# Patient Record
Sex: Male | Born: 1945 | Race: White | Hispanic: No | Marital: Married | Smoking: Former smoker
Health system: Southern US, Community
[De-identification: ages and names within clinical notes are randomized; demographics above are authoritative.]

## PROBLEM LIST (undated history)

## (undated) DIAGNOSIS — Z955 Presence of coronary angioplasty implant and graft: Secondary | ICD-10-CM

## (undated) DIAGNOSIS — N189 Chronic kidney disease, unspecified: Secondary | ICD-10-CM

## (undated) DIAGNOSIS — F17210 Nicotine dependence, cigarettes, uncomplicated: Secondary | ICD-10-CM

## (undated) DIAGNOSIS — N400 Enlarged prostate without lower urinary tract symptoms: Secondary | ICD-10-CM

## (undated) DIAGNOSIS — J45909 Unspecified asthma, uncomplicated: Secondary | ICD-10-CM

## (undated) DIAGNOSIS — Z951 Presence of aortocoronary bypass graft: Secondary | ICD-10-CM

## (undated) DIAGNOSIS — E7800 Pure hypercholesterolemia, unspecified: Secondary | ICD-10-CM

## (undated) DIAGNOSIS — I4891 Unspecified atrial fibrillation: Secondary | ICD-10-CM

## (undated) DIAGNOSIS — I10 Essential (primary) hypertension: Secondary | ICD-10-CM

## (undated) DIAGNOSIS — J449 Chronic obstructive pulmonary disease, unspecified: Secondary | ICD-10-CM

## (undated) DIAGNOSIS — R059 Cough, unspecified: Secondary | ICD-10-CM

## (undated) DIAGNOSIS — R600 Localized edema: Secondary | ICD-10-CM

## (undated) DIAGNOSIS — I2510 Atherosclerotic heart disease of native coronary artery without angina pectoris: Secondary | ICD-10-CM

## (undated) HISTORY — DX: Presence of aortocoronary bypass graft: Z95.1

## (undated) HISTORY — DX: Pure hypercholesterolemia, unspecified: E78.00

## (undated) HISTORY — DX: Localized edema: R60.0

## (undated) HISTORY — DX: Unspecified asthma, uncomplicated: J45.909

## (undated) HISTORY — DX: Presence of coronary angioplasty implant and graft: Z95.5

## (undated) HISTORY — DX: Chronic kidney disease, unspecified: N18.9

## (undated) HISTORY — DX: Unspecified atrial fibrillation: I48.91

## (undated) HISTORY — PX: CORONARY ARTERY BYPASS GRAFT: SHX141

## (undated) HISTORY — DX: Benign prostatic hyperplasia without lower urinary tract symptoms: N40.0

## (undated) HISTORY — DX: Cough, unspecified: R05.9

## (undated) HISTORY — DX: Nicotine dependence, cigarettes, uncomplicated: F17.210

## (undated) HISTORY — PX: CARDIAC SURGERY: SHX584

## (undated) HISTORY — DX: Chronic obstructive pulmonary disease, unspecified: J44.9

## (undated) HISTORY — DX: Essential (primary) hypertension: I10

---

## 2014-06-01 DIAGNOSIS — I959 Hypotension, unspecified: Secondary | ICD-10-CM | POA: Insufficient documentation

## 2014-06-01 DIAGNOSIS — J9602 Acute respiratory failure with hypercapnia: Secondary | ICD-10-CM | POA: Insufficient documentation

## 2014-06-01 DIAGNOSIS — E119 Type 2 diabetes mellitus without complications: Secondary | ICD-10-CM | POA: Insufficient documentation

## 2014-06-01 DIAGNOSIS — I10 Essential (primary) hypertension: Secondary | ICD-10-CM | POA: Insufficient documentation

## 2014-06-03 DIAGNOSIS — I2510 Atherosclerotic heart disease of native coronary artery without angina pectoris: Secondary | ICD-10-CM | POA: Insufficient documentation

## 2014-06-03 DIAGNOSIS — N179 Acute kidney failure, unspecified: Secondary | ICD-10-CM | POA: Insufficient documentation

## 2014-06-03 DIAGNOSIS — N189 Chronic kidney disease, unspecified: Secondary | ICD-10-CM | POA: Insufficient documentation

## 2014-06-03 DIAGNOSIS — D638 Anemia in other chronic diseases classified elsewhere: Secondary | ICD-10-CM | POA: Insufficient documentation

## 2014-06-03 DIAGNOSIS — D649 Anemia, unspecified: Secondary | ICD-10-CM | POA: Insufficient documentation

## 2020-03-28 ENCOUNTER — Emergency Department (HOSPITAL_COMMUNITY)
Admission: EM | Admit: 2020-03-28 | Discharge: 2020-03-28 | Disposition: A | Discharge status: Home / Self Care | Payer: Medicare Other | Attending: Emergency Medicine | Admitting: Emergency Medicine

## 2020-03-28 ENCOUNTER — Encounter (HOSPITAL_COMMUNITY): Payer: Self-pay | Admitting: *Deleted

## 2020-03-28 ENCOUNTER — Other Ambulatory Visit: Payer: Self-pay

## 2020-03-28 DIAGNOSIS — T83091A Other mechanical complication of indwelling urethral catheter, initial encounter: Secondary | ICD-10-CM | POA: Diagnosis not present

## 2020-03-28 DIAGNOSIS — T83098A Other mechanical complication of other indwelling urethral catheter, initial encounter: Secondary | ICD-10-CM | POA: Diagnosis not present

## 2020-03-28 DIAGNOSIS — T839XXA Unspecified complication of genitourinary prosthetic device, implant and graft, initial encounter: Secondary | ICD-10-CM

## 2020-03-28 DIAGNOSIS — R339 Retention of urine, unspecified: Secondary | ICD-10-CM | POA: Diagnosis present

## 2020-03-28 DIAGNOSIS — Y731 Therapeutic (nonsurgical) and rehabilitative gastroenterology and urology devices associated with adverse incidents: Secondary | ICD-10-CM | POA: Diagnosis not present

## 2020-03-28 DIAGNOSIS — N401 Enlarged prostate with lower urinary tract symptoms: Secondary | ICD-10-CM | POA: Insufficient documentation

## 2020-03-28 DIAGNOSIS — I2510 Atherosclerotic heart disease of native coronary artery without angina pectoris: Secondary | ICD-10-CM | POA: Diagnosis not present

## 2020-03-28 HISTORY — DX: Atherosclerotic heart disease of native coronary artery without angina pectoris: I25.10

## 2020-03-28 LAB — URINALYSIS, ROUTINE W REFLEX MICROSCOPIC
Bilirubin Urine: NEGATIVE
Glucose, UA: NEGATIVE mg/dL
Ketones, ur: NEGATIVE mg/dL
Nitrite: NEGATIVE
Protein, ur: NEGATIVE mg/dL
RBC / HPF: >50 RBC/hpf — ABNORMAL HIGH (ref 0–5)
Specific Gravity, Urine: 1.009 (ref 1.005–1.030)
WBC, UA: >50 WBC/hpf — ABNORMAL HIGH (ref 0–5)
pH: 7 (ref 5.0–8.0)

## 2020-03-28 NOTE — ED Triage Notes (Signed)
Has indwelling catheter but states it is not draining

## 2020-03-28 NOTE — ED Provider Notes (Signed)
[facility] EMERGENCY DEPARTMENT Provider Note   CSN: [id number] Arrival date & time: 03/28/20  1405     History Chief Complaint  Patient presents with  . Urinary Retention    Romelle Reiley is a 74 y.o. male.  HPI  Patient is a 74 year old male story significant for coronary artery disease and BPH.  He was recently diagnosed with BPH after he had issues with urinary retention.  He has seen neurology in another state and has recently moved to regional [facility] where he is hoping to establish care for primary care doctor within the next week.  He has not established with a urologist here.  Patient states that this morning he was unable to urinate/void from his Foley catheter.  He states that without his Foley catheter he retains urine/sometimes has urinary incontinence.  Patient denies any fever, abdominal pain, nausea, vomiting, chest pain or shortness of breath.  States he feels otherwise well.     Past Medical History:  Diagnosis Date  . Coronary artery disease     There are no problems to display for this patient.        No family history on file.  Social History   Tobacco Use  . Smoking status: Not on file  Substance Use Topics  . Alcohol use: Not on file  . Drug use: Not on file    Home Medications Prior to Admission medications   Not on File    Allergies    Lipitor [atorvastatin]  Review of Systems   Review of Systems  Constitutional: Negative for chills and fever.  HENT: Negative for congestion.   Respiratory: Negative for shortness of breath.   Cardiovascular: Negative for chest pain.  Gastrointestinal: Negative for abdominal pain.  Genitourinary: Positive for difficulty urinating.  Musculoskeletal: Negative for neck pain.    Physical Exam Updated Vital Signs BP 138/85   Pulse 78   Temp 97.6 F (36.4 C)   Resp 20   SpO2 98%   Physical Exam Vitals and nursing note reviewed.  Constitutional:      General: He is not in  acute distress.    Appearance: Normal appearance. He is not ill-appearing.  HENT:     Head: Normocephalic and atraumatic.     Mouth/Throat:     Mouth: Mucous membranes are moist.  Eyes:     General: No scleral icterus.       Right eye: No discharge.        Left eye: No discharge.     Conjunctiva/sclera: Conjunctivae normal.  Pulmonary:     Effort: Pulmonary effort is normal.     Breath sounds: No stridor.  Abdominal:     Tenderness: There is no abdominal tenderness. There is no right CVA tenderness, left CVA tenderness, guarding or rebound.  Genitourinary:    Penis: Normal.      Testes: Normal.     Comments: Patient with Foley catheter in place.  It is not currently draining into leg bag Neurological:     Mental Status: He is alert and oriented to person, place, and time. Mental status is at baseline.     ED Results / Procedures / Treatments   Labs (all labs ordered are listed, but only abnormal results are displayed) Labs Reviewed  URINALYSIS, ROUTINE W REFLEX MICROSCOPIC - Abnormal; Notable for the following components:      Result Value   APPearance HAZY (*)    Hgb urine dipstick MODERATE (*)    Leukocytes,Ua MODERATE (*)  RBC / HPF >50 (*)    WBC, UA >50 (*)    Bacteria, UA RARE (*)    All other components within normal limits    EKG None  Radiology No results found.  Procedures Procedures (including critical care time) Foley catheter flushed with 10 cc of normal saline ; afterwards, significant urine was expelled from catheter.  It is amber with some sediment present.  Patient states tightness in the lower abdomen is completely relieved after procedure.  Medications Ordered in ED Medications - No data to display  ED Course  I have reviewed the triage vital signs and the nursing notes.  Pertinent labs & imaging results that were available during my care of the patient were reviewed by me and considered in my medical decision making (see chart for  details).    MDM Rules/Calculators/A&P                      Patient is 74 year old male with BPH presented today for Foley catheter blockage.  He has no abdominal tenderness, CVA tenderness, is afebrile and is well-appearing with normal vital signs.  Physical exam is completely unremarkable.  Foley catheter was flushed by myself with 10 cc of normal saline and relieve blockage.  Significant urine was expelled after blockage was removed.  Patient has no other urinary complaints.  Urinalysis shows some moderate leukocytes, moderate hemoglobin and WBCs present with rare bacteria.  Patient has no history of kidney stones ever.  He denies any abdominal pain, flank pain.  Doubt urinary stone involved in obstruction today.  Suspect that this is secondary to sediment.  There is no gross hematuria and doubt that there is a clot causing the obstruction today however he is following up with urology closely he will further evaluate and monitor and make recommendations.  Patient has a primary care doctor in [place] who he will follow-up with.  Patient is well-appearing prior to discharge.  Vital signs remained within normal limits.  I discussed this case with my attending physician who cosigned this note including patient's presenting symptoms, physical exam, and planned diagnostics and interventions. Attending physician stated agreement with plan or made changes to plan which were implemented.    Final Clinical Impression(s) / ED Diagnoses Final diagnoses:  Problem with Foley catheter, initial encounter [facility])  Obstruction of Foley catheter, initial encounter [facility])    Rx / [place] Orders ED Discharge Orders    None       Tedd Sias, [place] 03/28/20 1652    Margette Fast, MD 03/31/20 1332

## 2020-03-28 NOTE — ED Notes (Signed)
Emptied 225 ml urine from leg bag. Patient tolerated well.

## 2020-03-28 NOTE — ED Notes (Signed)
Patient states he has had catheter approximately 1 month. States there was urine in bag upon awakening this morning but after that there has been no more drainage into urine bag. Bladder scan performed showing 601 ml in bladder at this time.

## 2020-03-28 NOTE — Discharge Instructions (Signed)
Please follow-up with urology. Please return to ED if you have any new or concerning symptoms.

## 2020-03-31 ENCOUNTER — Emergency Department (HOSPITAL_COMMUNITY)
Admission: EM | Admit: 2020-03-31 | Discharge: 2020-03-31 | Disposition: A | Discharge status: Home / Self Care | Payer: Medicare Other | Attending: Emergency Medicine | Admitting: Emergency Medicine

## 2020-03-31 ENCOUNTER — Encounter (HOSPITAL_COMMUNITY): Payer: Self-pay | Admitting: Emergency Medicine

## 2020-03-31 ENCOUNTER — Other Ambulatory Visit: Payer: Self-pay

## 2020-03-31 DIAGNOSIS — N390 Urinary tract infection, site not specified: Secondary | ICD-10-CM | POA: Diagnosis not present

## 2020-03-31 DIAGNOSIS — R339 Retention of urine, unspecified: Secondary | ICD-10-CM | POA: Diagnosis present

## 2020-03-31 DIAGNOSIS — F17200 Nicotine dependence, unspecified, uncomplicated: Secondary | ICD-10-CM | POA: Insufficient documentation

## 2020-03-31 DIAGNOSIS — T83091A Other mechanical complication of indwelling urethral catheter, initial encounter: Secondary | ICD-10-CM | POA: Insufficient documentation

## 2020-03-31 DIAGNOSIS — Z960 Presence of urogenital implants: Secondary | ICD-10-CM | POA: Insufficient documentation

## 2020-03-31 DIAGNOSIS — T83098A Other mechanical complication of other indwelling urethral catheter, initial encounter: Secondary | ICD-10-CM | POA: Diagnosis not present

## 2020-03-31 MED ORDER — CEPHALEXIN 500 MG PO CAPS: 1000.0000 mg | ORAL_CAPSULE | Freq: Two times a day (BID) | ORAL | 0 refills | Status: DC

## 2020-03-31 MED ORDER — CEPHALEXIN 500 MG PO CAPS
1000.0000 mg | ORAL_CAPSULE | Freq: Once | ORAL | Status: AC
  Administered 2020-03-31: 1000 mg via ORAL
  Filled 2020-03-31: qty 2

## 2020-03-31 NOTE — ED Notes (Signed)
Attempted to irrigate foley catheter with no success. No drainage into to foley bag. MD notified

## 2020-03-31 NOTE — Discharge Instructions (Signed)
It was our pleasure to provide your ER care today - we hope that you feel better.  Drink plenty of fluids. Take antibiotic as prescribed. Empty leg bag as need.   Follow up with urologist in the next 1-2 weeks.   Return to ER if worse, new symptoms, high fevers, new or severe pain, weak/fainting, catheter not draining, or other concern.

## 2020-03-31 NOTE — ED Triage Notes (Signed)
Pt with c/o leaking foley catheter around insertion site and blocked catheter.

## 2020-03-31 NOTE — ED Provider Notes (Signed)
[facility] EMERGENCY DEPARTMENT Provider Note   CSN: [id number] Arrival date & time: 03/31/20  4818     History Chief Complaint  Patient presents with  . Urinary Retention    Daniel Barker is a 74 y.o. male.  Patient c/o foley catheter not flowing this morning. Symptoms acute onset, moderate, persistent. States initially placed approximately 1 month ago. Notes 1 prior ED visit due to foley obstruction. States normal appetite, normal po intake. Foley had been draining normally, normal amount up until this AM. Mild suprapubic fullness/discomfort. No back or flank pain. No fever or chills. No gross hematuria.   The history is provided by the patient.       Past Medical History:  Diagnosis Date  . Coronary artery disease     There are no problems to display for this patient.   Past Surgical History:  Procedure Laterality Date  . CARDIAC SURGERY         No family history on file.  Social History   Tobacco Use  . Smoking status: Current Some Day Smoker  . Smokeless tobacco: Never Used  Substance Use Topics  . Alcohol use: Not Currently  . Drug use: Not on file    Home Medications Prior to Admission medications   Not on File    Allergies    Lipitor [atorvastatin]  Review of Systems   Review of Systems  Constitutional: Negative for chills and fever.  HENT: Negative for sore throat.   Eyes: Negative for redness.  Respiratory: Negative for shortness of breath.   Cardiovascular: Negative for chest pain.  Gastrointestinal: Negative for nausea and vomiting.  Genitourinary: Negative for flank pain.  Musculoskeletal: Negative for back pain.  Skin: Negative for rash.  Neurological: Negative for headaches.  Hematological: Does not bruise/bleed easily.  Psychiatric/Behavioral: Negative for confusion.    Physical Exam Updated Vital Signs BP (!) 141/79 (BP Location: Right Arm)   Pulse 82   Temp 97.6 F (36.4 C) (Oral)   Resp 16   Ht 1.702 m (5\' 7" )   Wt  80.7 kg   SpO2 96%   BMI 27.88 kg/m   Physical Exam Vitals and nursing note reviewed.  Constitutional:      Appearance: Normal appearance. He is well-developed.  HENT:     Head: Atraumatic.     Nose: Nose normal.     Mouth/Throat:     Mouth: Mucous membranes are moist.  Eyes:     General: No scleral icterus.    Conjunctiva/sclera: Conjunctivae normal.  Neck:     Trachea: No tracheal deviation.  Cardiovascular:     Rate and Rhythm: Normal rate.     Pulses: Normal pulses.  Pulmonary:     Effort: Pulmonary effort is normal. No accessory muscle usage or respiratory distress.  Abdominal:     General: Bowel sounds are normal. There is no distension.     Palpations: Abdomen is soft.     Tenderness: There is no abdominal tenderness. There is no guarding.  Genitourinary:    Comments: No cva tenderness. Normal external gu exam, foley in place. Clear yellow urine in bag.  Musculoskeletal:        General: No swelling.     Cervical back: Normal range of motion and neck supple. No rigidity.  Skin:    General: Skin is warm and dry.     Findings: No rash.  Neurological:     Mental Status: He is alert.     Comments: Alert, speech clear.  Psychiatric:        Mood and Affect: Mood normal.     ED Results / Procedures / Treatments   Labs (all labs ordered are listed, but only abnormal results are displayed) Results for orders placed or performed during the hospital encounter of 03/28/20  Urinalysis, Routine w reflex microscopic  Result Value Ref Range   Color, Urine YELLOW YELLOW   APPearance HAZY (A) CLEAR   Specific Gravity, Urine 1.009 1.005 - 1.030   pH 7.0 5.0 - 8.0   Glucose, UA NEGATIVE NEGATIVE mg/dL   Hgb urine dipstick MODERATE (A) NEGATIVE   Bilirubin Urine NEGATIVE NEGATIVE   Ketones, ur NEGATIVE NEGATIVE mg/dL   Protein, ur NEGATIVE NEGATIVE mg/dL   Nitrite NEGATIVE NEGATIVE   Leukocytes,Ua MODERATE (A) NEGATIVE   RBC / HPF >50 (H) 0 - 5 RBC/hpf   WBC, UA >50  (H) 0 - 5 WBC/hpf   Bacteria, UA RARE (A) NONE SEEN    EKG None  Radiology No results found.  Procedures Procedures (including critical care time)  Medications Ordered in ED Medications - No data to display  ED Course  I have reviewed the triage vital signs and the nursing notes.  Pertinent labs & imaging results that were available during my care of the patient were reviewed by me and considered in my medical decision making (see chart for details).    MDM Rules/Calculators/A&P                     Bladder scan performed - approximately 600-700 urine in bladder. Irrigate/flush foley.   Reviewed nursing notes and prior charts for additional history. Recent ED visit noted for similar symptoms.   rn unable to flush foley. Old foley removed. New large bore foley placed. ~ 600 cc very cloudy urine. Resolutions of patients symptoms. abd soft nt. Recent ua with > 50 wbc. todays urine sent for culture.   Keflex 1 gm po.  Po fluids.   Patient currently appears stable for d/c.   Return precautions provided.     Final Clinical Impression(s) / ED Diagnoses Final diagnoses:  None    Rx / DC Orders ED Discharge Orders    None       Lajean Saver, MD 03/31/20 1335

## 2020-04-01 LAB — URINE CULTURE

## 2020-05-13 DIAGNOSIS — Z0189 Encounter for other specified special examinations: Secondary | ICD-10-CM | POA: Diagnosis not present

## 2020-05-13 DIAGNOSIS — J449 Chronic obstructive pulmonary disease, unspecified: Secondary | ICD-10-CM | POA: Diagnosis not present

## 2020-05-13 DIAGNOSIS — E782 Mixed hyperlipidemia: Secondary | ICD-10-CM | POA: Diagnosis not present

## 2020-05-13 DIAGNOSIS — I482 Chronic atrial fibrillation, unspecified: Secondary | ICD-10-CM | POA: Diagnosis not present

## 2020-05-13 DIAGNOSIS — I10 Essential (primary) hypertension: Secondary | ICD-10-CM | POA: Diagnosis not present

## 2020-05-14 DIAGNOSIS — J440 Chronic obstructive pulmonary disease with acute lower respiratory infection: Secondary | ICD-10-CM | POA: Diagnosis not present

## 2020-05-14 DIAGNOSIS — N189 Chronic kidney disease, unspecified: Secondary | ICD-10-CM | POA: Diagnosis not present

## 2020-05-14 DIAGNOSIS — I10 Essential (primary) hypertension: Secondary | ICD-10-CM | POA: Diagnosis not present

## 2020-05-14 DIAGNOSIS — E782 Mixed hyperlipidemia: Secondary | ICD-10-CM | POA: Diagnosis not present

## 2020-05-15 ENCOUNTER — Ambulatory Visit (HOSPITAL_COMMUNITY)
Admission: RE | Admit: 2020-05-15 | Discharge: 2020-05-15 | Disposition: A | Discharge status: Home / Self Care | Payer: Medicare Other | Source: Ambulatory Visit | Attending: Internal Medicine | Admitting: Internal Medicine

## 2020-05-15 ENCOUNTER — Other Ambulatory Visit (HOSPITAL_COMMUNITY): Payer: Self-pay | Admitting: Internal Medicine

## 2020-05-15 ENCOUNTER — Other Ambulatory Visit: Payer: Self-pay

## 2020-05-15 DIAGNOSIS — R05 Cough: Secondary | ICD-10-CM | POA: Diagnosis not present

## 2020-05-15 DIAGNOSIS — R059 Cough, unspecified: Secondary | ICD-10-CM

## 2020-05-15 NOTE — Progress Notes (Signed)
CARDIOLOGY CONSULT NOTE       Patient ID: Daniel Barker MRN: [id number] DOB/AGE: 12-12-1945 74 y.o.  Admit date: (Not on file) Referring Physician: [name] Primary Physician: [name], MD Primary Cardiologist: New Reason for Consultation: CAD/CABG Dyspnea   Active Problems:   * No active hospital problems. *   HPI:  74 y.o. referred by Dr [name] for CAD ? History of CABG and dyspnea Unfortunately patient recently moved to [facility] from [place] area  and we have no old records. He has BPH and has recently had retention requiring foley with obstruction and change out in ER May. He is a smoker with apparent COPD He carries diagnosis of BPH, HLD, HTN, CRF/Edema, COPD/Asthma, CAD and PAF   Myovue 02/10/19 normal no ischemia some SVT/PAF during study  He indicates having a new stent placed last year ? Graft failure  He has no angina  His biggest issues are COPD with ongoing smoking, Dementia and prostatism  His wife has dementia and they moved her to live with step daughter Patients memory seems very poor on initial interview as well   He refuses to get COVID vaccine Discussed at length.   ROS All other systems reviewed and negative except as noted above  Past Medical History:  Diagnosis Date  . Coronary artery disease     No family history on file.  Social History   Socioeconomic History  . Marital status: Single    Spouse name: Not on file  . Number of children: Not on file  . Years of education: Not on file  . Highest education level: Not on file  Occupational History  . Not on file  Tobacco Use  . Smoking status: Current Some Day Smoker  . Smokeless tobacco: Never Used  Substance and Sexual Activity  . Alcohol use: Not Currently  . Drug use: Not on file  . Sexual activity: Not on file  Other Topics Concern  . Not on file  Social History Narrative  . Not on file   Social Determinants of Health   Financial Resource Strain:   . Difficulty of Paying Living  Expenses:   Food Insecurity:   . Worried About Charity fundraiser in the Last Year:   . Arboriculturist in the Last Year:   Transportation Needs:   . Film/video editor (Medical):   Marland Kitchen Lack of Transportation (Non-Medical):   Physical Activity:   . Days of Exercise per Week:   . Minutes of Exercise per Session:   Stress:   . Feeling of Stress :   Social Connections:   . Frequency of Communication with Friends and Family:   . Frequency of Social Gatherings with Friends and Family:   . Attends Religious Services:   . Active Member of Clubs or Organizations:   . Attends Archivist Meetings:   Marland Kitchen Marital Status:   Intimate Partner Violence:   . Fear of Current or Ex-Partner:   . Emotionally Abused:   Marland Kitchen Physically Abused:   . Sexually Abused:     Past Surgical History:  Procedure Laterality Date  . CARDIAC SURGERY        Current Outpatient Medications:  .  cephALEXin (KEFLEX) 500 MG capsule, Take 2 capsules (1,000 mg total) by mouth 2 (two) times daily., Disp: 28 capsule, Rfl: 0    Physical Exam: There were no vitals taken for this visit.   Affect appropriate Healthy:  appears stated age 74: normal Neck  supple with no adenopathy JVP normal no bruits no thyromegaly Lungs clear with no wheezing and good diaphragmatic motion Heart:  S1/S2 no murmur, no rub, gallop or click PMI normal post CABG  Abdomen: benighn, BS positve, no tenderness, no AAA no bruit.  No HSM or HJR Distal pulses intact with no bruits No edema Neuro non-focal Skin warm and dry No muscular weakness      Radiology: No results found.  EKG: SR rate 80 nonspecific ST changes septal infarct    ASSESSMENT AND PLAN:   1. CAD/CABG:  Will try to get more records from [place]. He has no angina and non ischemic myovue 2020 Post CABG with ? Stenting to one of his grafts a year ago continue current meds  2. Dyspnea:  Smoker with COPD refer to pulmonary EF historically normal  3. Urology:   Retention with indwelling foley f/u urology 4. Dementia:  F/u primary his memory would appear to be very poor   F/u with cardiology in 6 months Referral to pulmonary for COPD  Signed: Jenkins Rouge 05/15/2020, 12:22 PM

## 2020-05-19 ENCOUNTER — Ambulatory Visit (INDEPENDENT_AMBULATORY_CARE_PROVIDER_SITE_OTHER): Payer: Medicare Other | Admitting: Cardiovascular Disease

## 2020-05-19 ENCOUNTER — Encounter: Payer: Self-pay | Admitting: Cardiovascular Disease

## 2020-05-19 ENCOUNTER — Other Ambulatory Visit: Payer: Self-pay

## 2020-05-19 VITALS — BP 128/62 | HR 56 | Ht 64.5 in | Wt 160.0 lb

## 2020-05-19 DIAGNOSIS — R079 Chest pain, unspecified: Secondary | ICD-10-CM

## 2020-05-19 DIAGNOSIS — Z951 Presence of aortocoronary bypass graft: Secondary | ICD-10-CM

## 2020-05-19 DIAGNOSIS — F0150 Vascular dementia without behavioral disturbance: Secondary | ICD-10-CM | POA: Diagnosis not present

## 2020-05-19 DIAGNOSIS — J449 Chronic obstructive pulmonary disease, unspecified: Secondary | ICD-10-CM | POA: Diagnosis not present

## 2020-05-19 NOTE — Patient Instructions (Signed)
Medication Instructions:  Your physician recommends that you continue on your current medications as directed. Please refer to the Current Medication list given to you today.  *If you need a refill on your cardiac medications before your next appointment, please call your pharmacy*   Lab Work: None today If you have labs (blood work) drawn today and your tests are completely normal, you will receive your results only by: Marland Kitchen MyChart Message (if you have MyChart) OR . A paper copy in the mail If you have any lab test that is abnormal or we need to change your treatment, we will call you to review the results.   Testing/Procedures: None today   Follow-Up: At [facility], you and your health needs are our priority.  As part of our continuing mission to provide you with exceptional heart care, we have created designated Provider Care Teams.  These Care Teams include your primary Cardiologist (physician) and Advanced Practice Providers (APPs -  Physician Assistants and Nurse Practitioners) who all work together to provide you with the care you need, when you need it.  We recommend signing up for the patient portal called "MyChart".  Sign up information is provided on this After Visit Summary.  MyChart is used to connect with patients for Virtual Visits (Telemedicine).  Patients are able to view lab/test results, encounter notes, upcoming appointments, etc.  Non-urgent messages can be sent to your provider as well.   To learn more about what you can do with MyChart, go to [email or web address].    Your next appointment:   6 month(s)  The format for your next appointment:   In Person  Provider:   Jenkins Rouge, MD   Other Instructions We placed a referral to Dr.Wert, pulmonologist. His office will call you to set an apt up.        Thank you for choosing [facility] !

## 2020-05-28 ENCOUNTER — Other Ambulatory Visit: Payer: Self-pay

## 2020-05-28 ENCOUNTER — Ambulatory Visit: Payer: Medicare Other | Admitting: Internal Medicine

## 2020-05-28 ENCOUNTER — Encounter: Payer: Self-pay | Admitting: Internal Medicine

## 2020-05-28 DIAGNOSIS — J449 Chronic obstructive pulmonary disease, unspecified: Secondary | ICD-10-CM | POA: Diagnosis not present

## 2020-05-28 DIAGNOSIS — F17210 Nicotine dependence, cigarettes, uncomplicated: Secondary | ICD-10-CM | POA: Diagnosis not present

## 2020-05-28 MED ORDER — ALBUTEROL SULFATE HFA 108 (90 BASE) MCG/ACT IN AERS: 1.0000 | INHALATION_SPRAY | Freq: Four times a day (QID) | RESPIRATORY_TRACT | 1 refills | Status: DC | PRN

## 2020-05-28 MED ORDER — ALBUTEROL SULFATE (2.5 MG/3ML) 0.083% IN NEBU: 2.5000 mg | INHALATION_SOLUTION | Freq: Four times a day (QID) | RESPIRATORY_TRACT | 1 refills | Status: DC | PRN

## 2020-05-28 MED ORDER — PREDNISONE 10 MG PO TABS: ORAL_TABLET | ORAL | 0 refills | Status: DC

## 2020-05-28 NOTE — Progress Notes (Signed)
Daniel Barker, male    DOB: 1946-04-17, 74 y.o.   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 Pulmonary Consult    Referred by [facility] for eval of COPD. Pt states he has been having trouble breathing for at least the past 10 years. He is SOB with just walking to his mailbox.   Dyspnea:  50 ft slt uphill to MB and that's about his limit/ worse in heat  Cough: rattling in am slt yellow  Sleep: bed is flat, several pillows SABA use: neb tid and rare saba   No other obvious patterns in  day to day or daytime variability or assoc   mucus plugs or hemoptysis or cp or chest tightness, subjective wheeze or overt sinus or hb symptoms.   Sleeping  without nocturnal  or early am exacerbation  of respiratory  c/o's or need for noct saba. Also denies any obvious fluctuation of symptoms with weather or environmental changes or other aggravating or alleviating factors except as outlined above   No unusual exposure hx or h/o childhood pna/ asthma or knowledge of premature birth.  Current Allergies, Complete Past Medical History, Past Surgical History, Family History, and Social History were reviewed in [employer] record.  ROS  The following are not active complaints unless bolded Hoarseness, sore throat, dysphagia, dental problems, itching, sneezing,  nasal congestion or discharge of excess mucus or purulent secretions, ear ache,   fever, chills, sweats, unintended wt loss or wt gain, classically pleuritic or exertional cp,   orthopnea pnd or arm/hand swelling  or leg swelling, presyncope, palpitations, abdominal pain, anorexia, nausea, vomiting, diarrhea  or change in bowel habits or change in bladder habits, change in stools or change in urine, dysuria, hematuria,  rash, arthralgias, visual complaints, headache, numbness, weakness or ataxia or problems with walking or coordination,  change in mood or  memory.           Past Medical History:  Diagnosis Date  . Asthma   . Atrial fibrillation ([place])   . BPH (benign prostatic hyperplasia)   . Cigarette smoker   . CKD (chronic kidney disease)   . COPD (chronic obstructive pulmonary disease) ([place])   . Coronary artery disease   . Cough   . Hx of CABG   . Hypercholesteremia   . Localized edema   . Stented coronary artery     Outpatient Medications Prior to Visit  Medication Sig Dispense Refill  . albuterol (PROVENTIL) (2.5 MG/3ML) 0.083% nebulizer solution Take 2.5 mg by nebulization every 6 (six) hours as needed for wheezing or shortness of breath.    Marland Kitchen albuterol (VENTOLIN HFA) 108 (90 Base) MCG/ACT inhaler Inhale 1-2 puffs into the lungs every 6 (six) hours as needed for wheezing or shortness of breath.    Marland Kitchen amLODipine (NORVASC) 5 MG tablet Take 5 mg by mouth daily.    Marland Kitchen apixaban (ELIQUIS) 2.5 MG TABS tablet Take 2.5 mg by mouth 2 (two) times daily.    Marland Kitchen  Budeson-Glycopyrrol-Formoterol (BREZTRI AEROSPHERE) 160-9-4.8 MCG/ACT AERO Inhale 2 puffs into the lungs in the morning and at bedtime.    . budesonide-formoterol (SYMBICORT) 160-4.5 MCG/ACT inhaler Inhale 2 puffs into the lungs 2 (two) times daily.    . clopidogrel (PLAVIX) 75 MG tablet Take 75 mg by mouth daily.    . fenofibrate micronized (LOFIBRA) 134 MG capsule Take 134 mg by mouth daily before breakfast.    . montelukast (SINGULAIR) 10 MG tablet Take 10 mg by mouth at bedtime.    . propafenone (RYTHMOL) 225 MG tablet Take 225 mg by mouth every 8 (eight) hours.    . torsemide (DEMADEX) 100 MG tablet Take  100 mg by mouth daily.        Objective:     Pulse 75   Temp (!) 96.5 F (35.8 C) (Oral)   Ht 5' 5.5" (1.664 m)   Wt 160 lb (72.6 kg)   SpO2 97% Comment: on RA  BMI 26.22 kg/m   SpO2: 97 % (on RA)  5cmod/distant wheeze   amb wm nad   HEENT : pt wearing mask not removed for exam due to covid -19 concerns.    NECK :  without JVD/Nodes/TM/ nl carotid upstrokes bilaterally   LUNGS: no acc muscle use,  Mod barrel  contour chest wall with bilateral  Distant expwheeze and  without cough on insp or exp maneuvers and mod  Hyperresonant  to  percussion bilaterally     CV:  RRR  no s3 or murmur or increase in P2, and no edema   ABD:  soft and nontender with pos mid insp Hoover's  in the supine position. No bruits or organomegaly appreciated, bowel sounds nl  MS:     ext warm without deformities, calf tenderness, cyanosis or clubbing No obvious joint restrictions   SKIN: warm and dry without lesions    NEURO:  alert, approp, nl sensorium with  no motor or cerebellar deficits apparent.       I personally reviewed images and agree with radiology impression as follows:  CXR:   PA and lateral 05/15/20 1. Likely chronic bronchitic changes, acute component difficult to exclude.  2. Age-indeterminate wedge compression deformity in the mid thoracic spine.    Assessment   No problem-specific Assessment & Plan notes found for this encounter.     Christinia Gully, MD 05/28/2020

## 2020-05-28 NOTE — Assessment & Plan Note (Signed)
Counseled re importance of smoking cessation but did not meet time criteria for separate billing    >> advised that even occ smoking needs to be avoided at this point          Each maintenance medication was reviewed in detail including emphasizing most importantly the difference between maintenance and prns and under what circumstances the prns are to be triggered using an action plan format where appropriate.  Total time for H and P, chart review, counseling, teaching device and generating customized AVS unique to this office visit / charting = 45 min

## 2020-05-28 NOTE — Patient Instructions (Addendum)
Plan A = Automatic = Always=    Breztri Take 2 puffs first thing in am and then another 2 puffs about 12 hours later.    Work on inhaler technique:  relax and gently blow all the way out then take a nice smooth deep breath back in, triggering the inhaler at same time you start breathing in.  Hold for up to 5 seconds if you can. Blow out thru nose. Rinse and gargle with water when done    Plan B = Backup (to supplement plan A, not to replace it) Only use your albuterol inhaler as a rescue medication to be used if you can't catch your breath by resting or doing a relaxed purse lip breathing pattern.  - The less you use it, the better it will work when you need it. - Ok to use the inhaler up to 2 puffs  every 4 hours if you must but call for appointment if use goes up over your usual need - Don't leave home without it !!  (think of it like the spare tire for your car)   Plan C = Crisis (instead of Plan B but only if Plan B stops working) - only use your albuterol nebulizer if you first try Plan B and it fails to help > ok to use the nebulizer up to every 4 hours but if start needing it regularly call for immediate appointment  Prednisone 10 mg take  4 each am x 2 days,   2 each am x 2 days,  1 each am x 2 days and stop   Please schedule a follow up office visit in 6 weeks, call sooner if needed - bring your inhalers and your empty symbicort

## 2020-05-28 NOTE — Assessment & Plan Note (Addendum)
Still smoking a few daily  - 05/28/2020  After extensive coaching inhaler device,  effectiveness =    75% from baseline near 0    Group D in terms of symptom/risk and laba/lama/ICS  therefore appropriate rx at this point >>>  Breztri 2bid and no need for symbicort/ more judicious use of saba > reset with Prednisone 10 mg take  4 each am x 2 days,   2 each am x 2 days,  1 each am x 2 days and stop   I spent extra time with pt today reviewing appropriate use of albuterol for prn use on exertion with the following points: 1) saba is for relief of sob that does not improve by walking a slower pace or resting but rather if the pt does not improve after trying this first. 2) If the pt is convinced, as many are, that saba helps recover from activity faster then it's easy to tell if this is the case by re-challenging : ie stop, take the inhaler, then p 5 minutes try the exact same activity (intensity of workload) that just caused the symptoms and see if they are substantially diminished or not after saba 3) if there is an activity that reproducibly causes the symptoms, try the saba 15 min before the activity on alternate days   If in fact the saba really does help, then fine to continue to use it prn but advised may need to look closer at the maintenance regimen being used to achieve better control of airways disease with exertion.   Pt informed of the seriousness of COVID 19 infection as a direct risk to lung health  and safey and to close contacts and should continue to wear a facemask in public and minimize exposure to public locations but especially avoid any area or activity where non-close contacts are not observing distancing or wearing an appropriate face mask.  I strongly recommended she take either of the vaccines available through local drugstores based on updated information on millions of Americans treated with the [place] products  which have proven both safe and  effective even against  the new delta variant.     > regroup in 6 weeks with all meds in hand using a trust but verify approach to confirm accurate Medication  Reconciliation The principal here is that until we are certain that the  patients are doing what we've asked, it makes no sense to ask them to do more.

## 2020-05-29 ENCOUNTER — Telehealth: Payer: Self-pay | Admitting: Internal Medicine

## 2020-05-29 MED ORDER — BREZTRI AEROSPHERE 160-9-4.8 MCG/ACT IN AERO: 2.0000 | INHALATION_SPRAY | Freq: Two times a day (BID) | RESPIRATORY_TRACT | 2 refills | Status: DC

## 2020-05-29 NOTE — Telephone Encounter (Signed)
Refill sent nothing further needed. Patient is aware.

## 2020-06-02 DIAGNOSIS — I482 Chronic atrial fibrillation, unspecified: Secondary | ICD-10-CM | POA: Diagnosis not present

## 2020-06-02 DIAGNOSIS — J449 Chronic obstructive pulmonary disease, unspecified: Secondary | ICD-10-CM | POA: Diagnosis not present

## 2020-06-02 DIAGNOSIS — Z955 Presence of coronary angioplasty implant and graft: Secondary | ICD-10-CM | POA: Diagnosis not present

## 2020-06-02 DIAGNOSIS — I10 Essential (primary) hypertension: Secondary | ICD-10-CM | POA: Diagnosis not present

## 2020-06-02 DIAGNOSIS — E782 Mixed hyperlipidemia: Secondary | ICD-10-CM | POA: Diagnosis not present

## 2020-06-30 IMAGING — DX DG CHEST 2V
2 series · 2 of 2 positions shown · non-contrast
Comparison: None.

CLINICAL DATA: Productive cough/asthma/copd/sob/smoker/hx cabg/hx
stent placement No covid test ordered

EXAM:
CHEST - 2 VIEW

[chest pa]
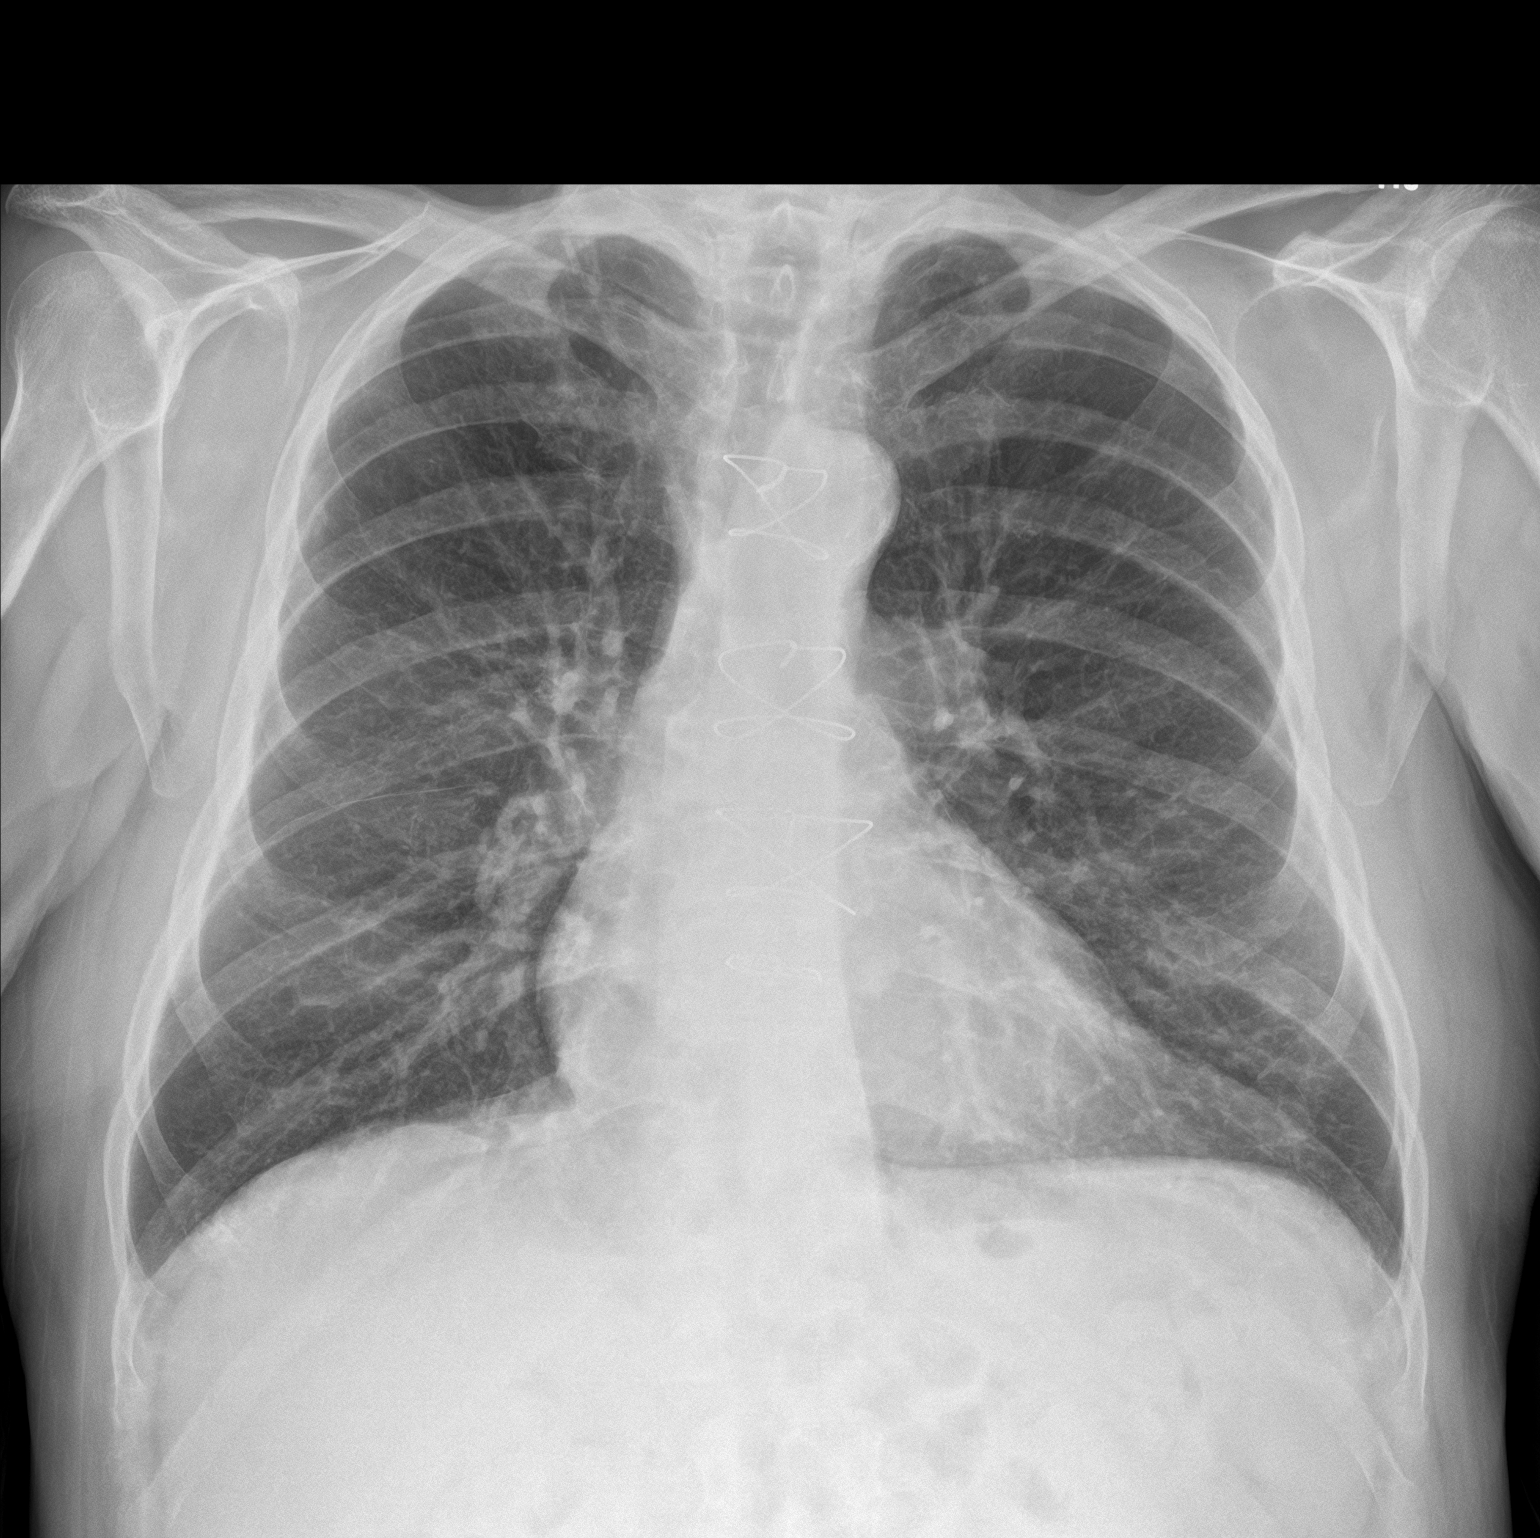

[chest lat]
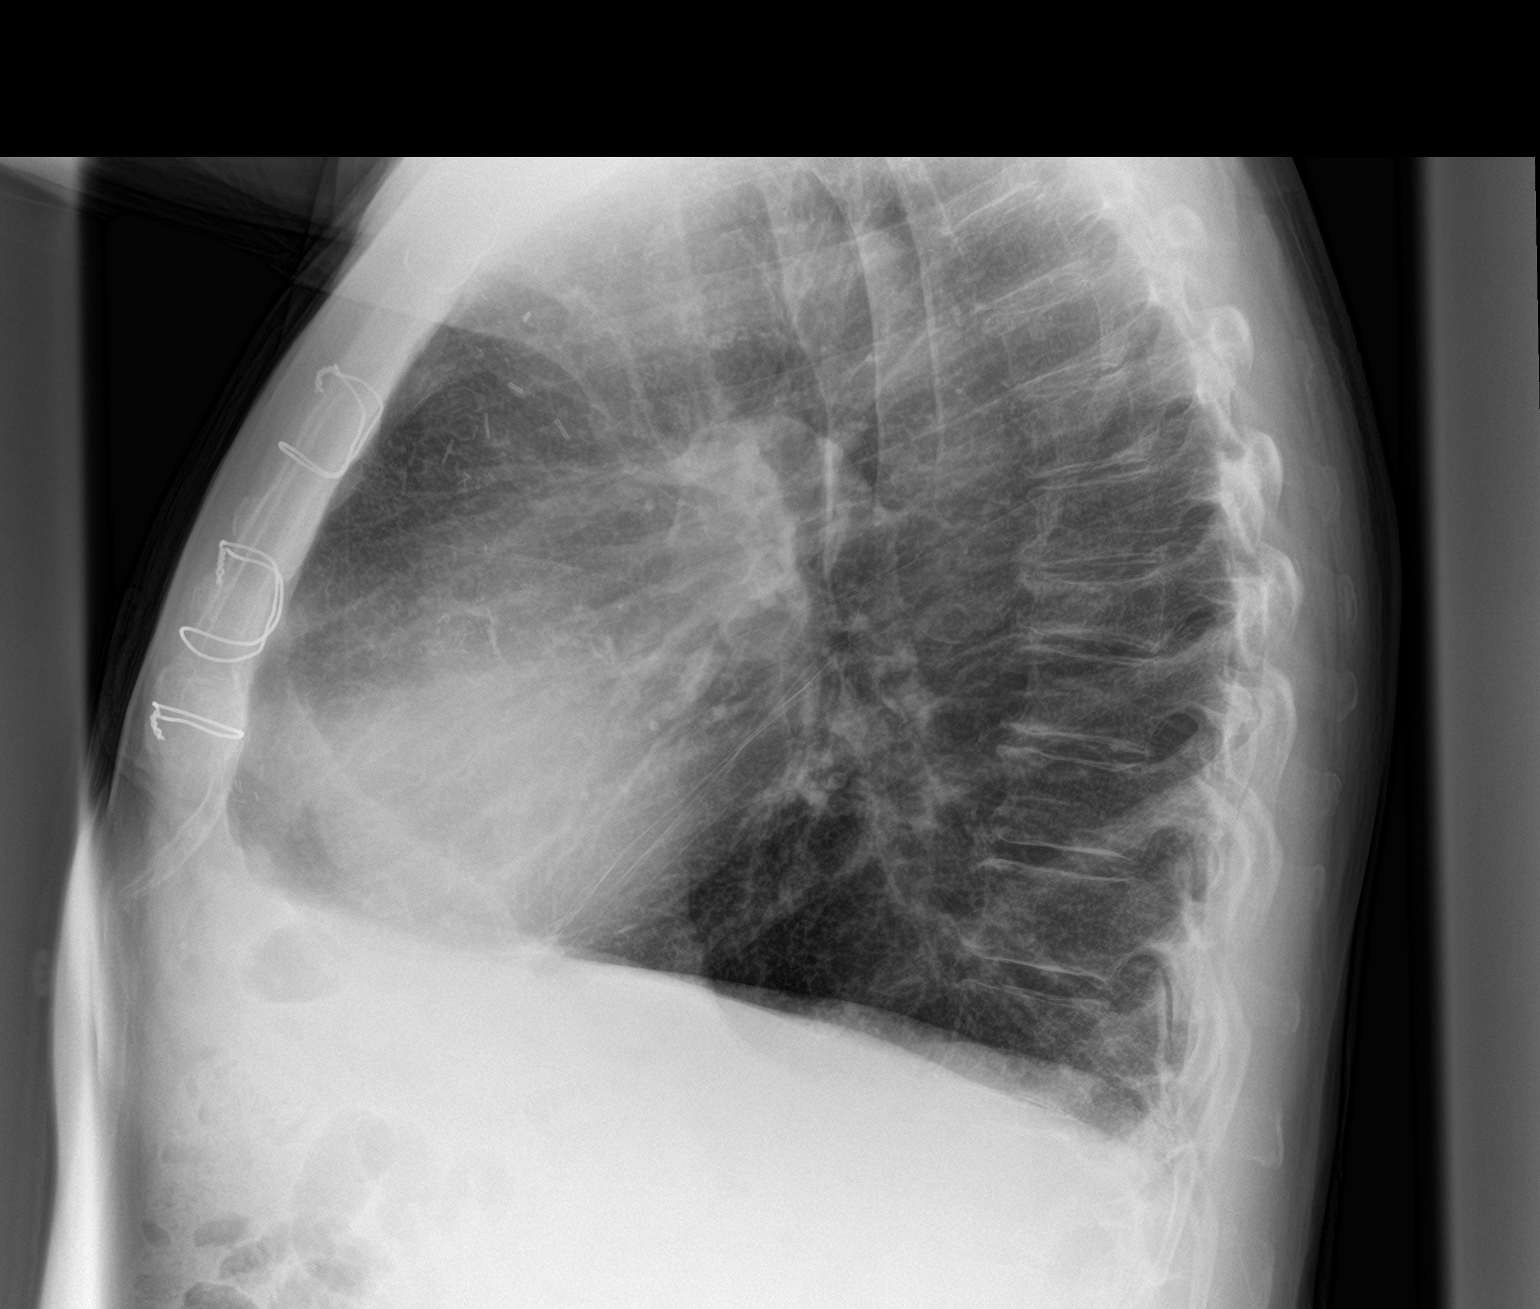

[2 of 2 positions shown; findings below may reference images not displayed]

FINDINGS: Surgical changes status post median sternotomy. Heart size is
normal. There is diffuse coarsening of the interstitium bilaterally.
No focal infiltrate. No pneumothorax or pleural effusion. There is
an age-indeterminate wedge compression deformity in the midthoracic
spine.
IMPRESSION: 1. Likely chronic bronchitic changes, acute component difficult to
exclude.

2. Age-indeterminate wedge compression deformity in the mid thoracic
spine.

## 2020-07-08 ENCOUNTER — Other Ambulatory Visit: Payer: Self-pay

## 2020-07-08 ENCOUNTER — Ambulatory Visit (INDEPENDENT_AMBULATORY_CARE_PROVIDER_SITE_OTHER): Payer: Medicare Other | Admitting: Urology

## 2020-07-08 ENCOUNTER — Encounter: Payer: Self-pay | Admitting: Urology

## 2020-07-08 VITALS — BP 130/74 | HR 83 | Temp 97.6°F | Ht 65.5 in | Wt 160.0 lb

## 2020-07-08 DIAGNOSIS — N138 Other obstructive and reflux uropathy: Secondary | ICD-10-CM | POA: Diagnosis not present

## 2020-07-08 DIAGNOSIS — N401 Enlarged prostate with lower urinary tract symptoms: Secondary | ICD-10-CM | POA: Insufficient documentation

## 2020-07-08 DIAGNOSIS — R339 Retention of urine, unspecified: Secondary | ICD-10-CM | POA: Insufficient documentation

## 2020-07-08 MED ORDER — TAMSULOSIN HCL 0.4 MG PO CAPS
0.4000 mg | ORAL_CAPSULE | Freq: Every day | ORAL | 11 refills | Status: DC
Start: 2020-07-08 — End: 2021-06-30

## 2020-07-08 NOTE — Progress Notes (Signed)
Urological Symptom Review  Patient is experiencing the following symptoms: Frequent urination Leakage of urine Erection problems (male only)  Pt currently has a catheter   Review of Systems  Gastrointestinal (upper)  : Negative for upper GI symptoms  Gastrointestinal (lower) : Negative for lower GI symptoms  Constitutional : Fatigue  Skin: Negative for skin symptoms  Eyes: Negative for eye symptoms  Ear/Nose/Throat : Negative for Ear/Nose/Throat symptoms  Hematologic/Lymphatic: Negative for Hematologic/Lymphatic symptoms  Cardiovascular : Negative for cardiovascular symptoms  Respiratory : Cough Shortness of breath  Endocrine: Negative for endocrine symptoms  Musculoskeletal: Negative for musculoskeletal symptoms  Neurological: Negative for neurological symptoms  Psychologic: Negative for psychiatric symptoms

## 2020-07-08 NOTE — Progress Notes (Signed)
07/08/2020 10:32 AM   Daniel Barker 07-08-1946 [id number]  Referring provider: No referring provider defined for this encounter.  Urinary retention  HPI: Daniel Barker is a 74yo here for evaluation for BPH and urinary retention. He developed new onset urinary incontinence while living in [place] and presented to the ER there and had a foley placed. He then moved to [facility] and was seen twice in the beginning of May for an obstructed foley. His foley has been in place since May 2021. He was evaluated by a Urologist in [place] who recommended surgery for BPH. He had a cystoscopy at that time. He has never been tried on BPH meds. He is on plavix and eliquis.    PMH: Past Medical History:  Diagnosis Date   Asthma    Atrial fibrillation (HCC)    BPH (benign prostatic hyperplasia)    Cigarette smoker    CKD (chronic kidney disease)    COPD (chronic obstructive pulmonary disease) (HCC)    Coronary artery disease    Cough    High cholesterol    Hx of CABG    Hypercholesteremia    Hypertension    Localized edema    Stented coronary artery     Surgical History: Past Surgical History:  Procedure Laterality Date   CARDIAC SURGERY     CORONARY ARTERY BYPASS GRAFT      Home Medications:  Allergies as of 07/08/2020      Reactions   Lipitor [atorvastatin]       Medication List       Accurate as of July 08, 2020 10:32 AM. If you have any questions, ask your nurse or doctor.        albuterol 108 (90 Base) MCG/ACT inhaler Commonly known as: VENTOLIN HFA Inhale 1-2 puffs into the lungs every 6 (six) hours as needed for wheezing or shortness of breath.   albuterol (2.5 MG/3ML) 0.083% nebulizer solution Commonly known as: PROVENTIL Take 3 mLs (2.5 mg total) by nebulization every 6 (six) hours as needed for wheezing or shortness of breath.   amLODipine 5 MG tablet Commonly known as: NORVASC Take 5 mg by mouth daily.   Breztri Aerosphere 160-9-4.8 MCG/ACT  Aero Generic drug: Budeson-Glycopyrrol-Formoterol Inhale 2 puffs into the lungs in the morning and at bedtime.   clopidogrel 75 MG tablet Commonly known as: PLAVIX Take 75 mg by mouth daily.   Eliquis 2.5 MG Tabs tablet Generic drug: apixaban Take 2.5 mg by mouth 2 (two) times daily.   fenofibrate micronized 134 MG capsule Commonly known as: LOFIBRA Take 134 mg by mouth daily before breakfast.   montelukast 10 MG tablet Commonly known as: SINGULAIR Take 10 mg by mouth at bedtime.   predniSONE 10 MG tablet Commonly known as: DELTASONE Take  4 each am x 2 days,   2 each am x 2 days,  1 each am x 2 days and stop   propafenone 225 MG tablet Commonly known as: RYTHMOL Take 225 mg by mouth every 8 (eight) hours.   torsemide 100 MG tablet Commonly known as: DEMADEX Take 100 mg by mouth daily.       Allergies:  Allergies  Allergen Reactions   Lipitor [Atorvastatin]     Family History: Family History  Problem Relation Age of Onset   Hypertension Mother    Clotting disorder Mother    COPD Father     Social History:  reports that he has been smoking. He has a 60.00 pack-year smoking history. He  has never used smokeless tobacco. He reports that he does not drink alcohol and does not use drugs.  ROS: All other review of systems were reviewed and are negative except what is noted above in HPI  Physical Exam: BP 130/74    Pulse 83    Temp 97.6 F (36.4 C)    Ht 5' 5.5" (1.664 m)    Wt 160 lb (72.6 kg)    BMI 26.22 kg/m   Constitutional:  Alert and oriented, No acute distress. HEENT: [place] AT, moist mucus membranes.  Trachea midline, no masses. Cardiovascular: No clubbing, cyanosis, or edema. Respiratory: Normal respiratory effort, no increased work of breathing. GI: Abdomen is soft, nontender, nondistended, no abdominal masses GU: No CVA tenderness. Circumcised phallus. No masses/lesions on penis, testis, scrotum. Prostate 60g smooth no nodules no induration.  Lymph:  No cervical or inguinal lymphadenopathy. Skin: No rashes, bruises or suspicious lesions. Neurologic: Grossly intact, no focal deficits, moving all 4 extremities. Psychiatric: Normal mood and affect.  Laboratory Data: No results found for: WBC, HGB, HCT, MCV, PLT  No results found for: CREATININE  No results found for: PSA  No results found for: TESTOSTERONE  No results found for: HGBA1C  Urinalysis    Component Value Date/Time   COLORURINE YELLOW 03/28/2020 1601   APPEARANCEUR HAZY (A) 03/28/2020 1601   LABSPEC 1.009 03/28/2020 1601   PHURINE 7.0 03/28/2020 1601   GLUCOSEU NEGATIVE 03/28/2020 1601   HGBUR MODERATE (A) 03/28/2020 1601   BILIRUBINUR NEGATIVE 03/28/2020 1601   KETONESUR NEGATIVE 03/28/2020 1601   PROTEINUR NEGATIVE 03/28/2020 1601   NITRITE NEGATIVE 03/28/2020 1601   LEUKOCYTESUR MODERATE (A) 03/28/2020 1601    Lab Results  Component Value Date   BACTERIA RARE (A) 03/28/2020    Pertinent Imaging:  No results found for this or any previous visit.  No results found for this or any previous visit.  No results found for this or any previous visit.  No results found for this or any previous visit.  No results found for this or any previous visit.  No results found for this or any previous visit.  No results found for this or any previous visit.  No results found for this or any previous visit.   Assessment & Plan:    1. Benign prostatic hyperplasia with urinary obstruction -We will start flomax 0.4mg  daily  2. Urinary retention -We will start flomax 0.4mg  daily. RTC 1 week for a voiding trial   No follow-ups on file.  Nicolette Bang, MD  [facility] [place]

## 2020-07-08 NOTE — Patient Instructions (Signed)

## 2020-07-09 ENCOUNTER — Encounter: Payer: Self-pay | Admitting: Internal Medicine

## 2020-07-09 ENCOUNTER — Ambulatory Visit: Payer: Medicare Other | Admitting: Internal Medicine

## 2020-07-09 DIAGNOSIS — J449 Chronic obstructive pulmonary disease, unspecified: Secondary | ICD-10-CM

## 2020-07-09 DIAGNOSIS — F17210 Nicotine dependence, cigarettes, uncomplicated: Secondary | ICD-10-CM

## 2020-07-09 MED ORDER — PREDNISONE 10 MG PO TABS: ORAL_TABLET | ORAL | 0 refills | Status: DC

## 2020-07-09 NOTE — Patient Instructions (Addendum)
Plan A = Automatic = Always=    Breztri (or Symbicort a week prior to next bladder doctor)  Take 2 puffs first thing in am and then another 2 puffs about 12 hours later.   Work on inhaler technique:  relax and gently blow all the way out then take a nice smooth deep breath back in, triggering the inhaler at same time you start breathing in.  Hold for up to 5 seconds if you can. Blow out thru nose. Rinse and gargle with water when done    Plan B = Backup (to supplement plan A, not to replace it) Only use your albuterol inhaler as a rescue medication to be used if you can't catch your breath by resting or doing a relaxed purse lip breathing pattern.  - The less you use it, the better it will work when you need it. - Ok to use the inhaler up to 2 puffs  every 4 hours if you must but call for appointment if use goes up over your usual need - Don't leave home without it !!  (think of it like the spare tire for your car)   Plan C = Crisis (instead of Plan B but only if Plan B stops working) - only use your albuterol nebulizer if you first try Plan B and it fails to help > ok to use the nebulizer up to every 4 hours but if start needing it regularly call for immediate appointment  Prednisone 10 mg take  4 each am x 2 days,   2 each am x 2 days,  1 each am x 2 days and stop   Please schedule a follow up office visit in 6-8  Weeks with PFT on return , call sooner if needed

## 2020-07-09 NOTE — Progress Notes (Addendum)
Daniel Barker, male    DOB: [birth year], 74 y.o.   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 moderna  Chief Complaint  Patient presents with  . Pulmonary Consult    Referred by [facility] for eval of COPD. Pt states he has been having trouble breathing for at least the past 10 years. He is SOB with just walking to his mailbox.   Dyspnea:  50 ft slt uphill to MB and that's about his limit/ worse in heat  Cough: rattling in am slt yellow  Sleep: bed is flat, several pillows SABA use: neb tid and rare saba  Rec Plan A = Automatic = Always=    Breztri Take 2 puffs first thing in am and then another 2 puffs about 12 hours later.  Work on inhaler technique:  Plan B = Backup (to supplement plan A, not to replace it) Only use your albuterol inhaler as a rescue medication   Plan C = Crisis (instead of Plan B but only if Plan B stops working) - only use your albuterol nebulizer if you first try Plan B   Prednisone 10 mg take  4 each am x 2 days,   2 each am x 2 days,  1 each am x 2 days and stop  Please schedule a follow up office visit in 6 weeks, call sooner if needed - bring your inhalers and your empty symbicort     07/09/2020  f/u ov/[place] office/Aryel Edelen re: copd / still smoking some/ maint on breztri/ got 1st covid shot with second one due end of August 2021 Chief Complaint  Patient presents with  . Follow-up    No complaints   Dyspnea: still walking to MB  Cough: rattling/ mostly white in am  X frew  Sleeping: bed is flat 2 pillows  On back  SABA use: still  too much  02:none  Has POC not using    No obvious day to day or daytime variability or assoc excess/ purulent sputum or mucus plugs or hemoptysis or cp or chest tightness, subjective wheeze or overt sinus or hb symptoms.   Sleeping  without nocturnal  or early am exacerbation  of respiratory  c/o's or need for noct saba. Also denies any obvious fluctuation of symptoms with weather or environmental changes or other aggravating or alleviating factors except as outlined above   No unusual exposure hx or h/o childhood pna/ asthma or knowledge of premature birth.  Current Allergies, Complete Past Medical History, Past Surgical History, Family History, and Social History were reviewed in [employer] record.  ROS  The following are not active complaints unless bolded Hoarseness, sore throat, dysphagia, dental problems, itching, sneezing,  nasal congestion or discharge of excess mucus or purulent secretions, ear ache,   fever, chills, sweats, unintended wt loss or wt gain, classically pleuritic or exertional cp,  orthopnea pnd or arm/hand swelling  or leg swelling feet sym, presyncope, palpitations, abdominal pain, anorexia, nausea, vomiting, diarrhea  or change in bowel  habits or change in bladder habits/foley dep, change in stools or change in urine, dysuria, hematuria,  rash, arthralgias, visual complaints, headache, numbness, weakness or ataxia or problems with walking or coordination,  change in mood or  memory.        Current Meds  Medication Sig  . albuterol (PROVENTIL) (2.5 MG/3ML) 0.083% nebulizer solution Take 3 mLs (2.5 mg total) by nebulization every 6 (six) hours as needed for wheezing or shortness of breath.  Marland Kitchen albuterol (VENTOLIN HFA) 108 (90 Base) MCG/ACT inhaler Inhale 1-2 puffs into the lungs every 6 (six) hours as needed for wheezing or shortness of breath.  Marland Kitchen amLODipine (NORVASC) 5 MG tablet Take 5 mg by mouth daily.  Marland Kitchen apixaban (ELIQUIS) 2.5 MG TABS tablet  Take 2.5 mg by mouth 2 (two) times daily.  . Budeson-Glycopyrrol-Formoterol (BREZTRI AEROSPHERE) 160-9-4.8 MCG/ACT AERO Inhale 2 puffs into the lungs in the morning and at bedtime.  . clopidogrel (PLAVIX) 75 MG tablet Take 75 mg by mouth daily.  . fenofibrate micronized (LOFIBRA) 134 MG capsule Take 134 mg by mouth daily before breakfast.  . montelukast (SINGULAIR) 10 MG tablet Take 10 mg by mouth at bedtime.  . propafenone (RYTHMOL) 225 MG tablet Take 225 mg by mouth every 8 (eight) hours.  . tamsulosin (FLOMAX) 0.4 MG CAPS capsule Take 1 capsule (0.4 mg total) by mouth daily.  Marland Kitchen torsemide (DEMADEX) 100 MG tablet Take 100 mg by mouth daily.  . [DISCONTINUED] metoprolol tartrate (LOPRESSOR) 100 MG tablet Take by mouth.                 Past Medical History:  Diagnosis Date  . Asthma   . Atrial fibrillation ([place])   . BPH (benign prostatic hyperplasia)   . Cigarette smoker   . CKD (chronic kidney disease)   . COPD (chronic obstructive pulmonary disease) ([place])   . Coronary artery disease   . Cough   . Hx of CABG   . Hypercholesteremia   . Localized edema   . Stented coronary artery          Objective:     amb somber wm nad   Wt Readings from Last 3 Encounters:  07/09/20 159 lb (72.1 kg)  07/08/20 160 lb (72.6 kg)  05/28/20 160 lb (72.6 kg)     Vital signs reviewed - Note on arrival 07/09/2020  02 sats  96% on RA     HEENT : pt wearing mask not removed for exam due to covid -19 concerns.    NECK :  without JVD/Nodes/TM/ nl carotid upstrokes bilaterally   LUNGS: no acc muscle use,  Mod barrel  contour chest wall with bilateral  Distant bs s audible wheeze and  without cough on insp or exp maneuvers and mod  Hyperresonant  to  percussion bilaterally     CV:  RRR  no s3 or murmur or increase in P2, and no edema   ABD:  soft and nontender with pos mid insp Hoover's  in the supine position. No bruits or organomegaly appreciated, bowel sounds nl  MS:     ext warm  without deformities, calf tenderness, cyanosis or clubbing No obvious joint restrictions   SKIN: warm and dry without lesions    NEURO:  alert, approp, nl sensorium with  no motor or cerebellar deficits apparent.               Assessment

## 2020-07-10 ENCOUNTER — Encounter: Payer: Self-pay | Admitting: Internal Medicine

## 2020-07-10 NOTE — Assessment & Plan Note (Signed)
Still smoking a few daily  - 07/09/2020  After extensive coaching inhaler device,  effectiveness =    75% (short Ti) - 07/09/2020 rec try symbicort > breztri due to bladder outlet obst   Group D in terms of symptom/risk and laba/lama/ICS  therefore appropriate rx at this point >>>  breztri approp but has pre-existing BOO so ok to change to symbicort 160 until sort out whether this problem can be corrected medically or not.    Using too much saba so rec Prednisone 10 mg take  4 each am x 2 days,   2 each am x 2 days,  1 each am x 2 days and stop  I spent extra time with pt today reviewing appropriate use of albuterol for prn use on exertion with the following points: 1) saba is for relief of sob that does not improve by walking a slower pace or resting but rather if the pt does not improve after trying this first. 2) If the pt is convinced, as many are, that saba helps recover from activity faster then it's easy to tell if this is the case by re-challenging : ie stop, take the inhaler, then p 5 minutes try the exact same activity (intensity of workload) that just caused the symptoms and see if they are substantially diminished or not after saba 3) if there is an activity that reproducibly causes the symptoms, try the saba 15 min before the activity on alternate days   If in fact the saba really does help, then fine to continue to use it prn but advised may need to look closer at the maintenance regimen being used to achieve better control of airways disease with exertion.

## 2020-07-10 NOTE — Assessment & Plan Note (Signed)

## 2020-07-16 ENCOUNTER — Other Ambulatory Visit: Payer: Self-pay | Admitting: Internal Medicine

## 2020-07-17 ENCOUNTER — Other Ambulatory Visit: Payer: Self-pay

## 2020-07-17 ENCOUNTER — Ambulatory Visit (INDEPENDENT_AMBULATORY_CARE_PROVIDER_SITE_OTHER): Payer: Medicare Other

## 2020-07-17 VITALS — BP 163/69 | HR 76 | Wt 150.0 lb

## 2020-07-17 DIAGNOSIS — R339 Retention of urine, unspecified: Secondary | ICD-10-CM

## 2020-07-17 NOTE — Progress Notes (Signed)
Fill and Pull Catheter Removal  Patient is present today for a catheter removal.  Patient was cleaned and prepped in a sterile fashion 134ml of sterile water/ saline was instilled into the bladder when the patient felt the urge to urinate. 34ml of water was then drained from the balloon.  A 16FR foley cath was removed from the bladder no complications were noted .  Patient as then given some time to void on their own.  Patient can void  177ml on their own after some time.  Patient tolerated well.  Performed by: Corena Tilson,LPN  Follow up/ Additional notes: 1 month OV w/ PVR

## 2020-07-24 ENCOUNTER — Other Ambulatory Visit: Payer: Self-pay | Admitting: Internal Medicine

## 2020-08-21 ENCOUNTER — Other Ambulatory Visit: Payer: Self-pay

## 2020-08-21 ENCOUNTER — Ambulatory Visit: Payer: Medicare Other | Admitting: Urology

## 2020-08-21 ENCOUNTER — Other Ambulatory Visit (HOSPITAL_COMMUNITY)
Admission: RE | Admit: 2020-08-21 | Discharge: 2020-08-21 | Disposition: A | Discharge status: Home / Self Care | Payer: Medicare Other | Source: Ambulatory Visit | Attending: Internal Medicine | Admitting: Internal Medicine

## 2020-08-21 DIAGNOSIS — Z20822 Contact with and (suspected) exposure to covid-19: Secondary | ICD-10-CM | POA: Insufficient documentation

## 2020-08-21 DIAGNOSIS — Z01812 Encounter for preprocedural laboratory examination: Secondary | ICD-10-CM | POA: Insufficient documentation

## 2020-08-22 LAB — SARS CORONAVIRUS 2 (TAT 6-24 HRS): SARS Coronavirus 2: NEGATIVE

## 2020-08-25 ENCOUNTER — Other Ambulatory Visit: Payer: Self-pay

## 2020-08-25 ENCOUNTER — Ambulatory Visit (HOSPITAL_COMMUNITY)
Admission: RE | Admit: 2020-08-25 | Discharge: 2020-08-25 | Disposition: A | Discharge status: Home / Self Care | Payer: Medicare Other | Source: Ambulatory Visit | Attending: Internal Medicine | Admitting: Internal Medicine

## 2020-08-25 DIAGNOSIS — J449 Chronic obstructive pulmonary disease, unspecified: Secondary | ICD-10-CM | POA: Insufficient documentation

## 2020-08-25 LAB — PULMONARY FUNCTION TEST
DL/VA % pred: 45 %
DL/VA: 1.83 ml/min/mmHg/L
DLCO unc % pred: 30 %
DLCO unc: 6.96 ml/min/mmHg
FEF 25-75 Post: 0.13 L/sec
FEF 25-75 Pre: 0.14 L/sec
FEF2575-%Change-Post: -3 %
FEF2575-%Pred-Post: 6 %
FEF2575-%Pred-Pre: 6 %
FEV1-%Change-Post: -3 %
FEV1-%Pred-Post: 11 %
FEV1-%Pred-Pre: 12 %
FEV1-Post: 0.32 L
FEV1-Pre: 0.33 L
FEV1FVC-%Change-Post: -15 %
FEV1FVC-%Pred-Pre: 41 %
FEV6-%Change-Post: 0 %
FEV6-%Pred-Post: 26 %
FEV6-%Pred-Pre: 26 %
FEV6-Post: 0.93 L
FEV6-Pre: 0.94 L
FEV6FVC-%Change-Post: -13 %
FEV6FVC-%Pred-Post: 78 %
FEV6FVC-%Pred-Pre: 90 %
FVC-%Change-Post: 14 %
FVC-%Pred-Post: 33 %
FVC-%Pred-Pre: 29 %
FVC-Post: 1.26 L
FVC-Pre: 1.1 L
Post FEV1/FVC ratio: 25 %
Post FEV6/FVC ratio: 74 %
Pre FEV1/FVC ratio: 30 %
Pre FEV6/FVC Ratio: 85 %

## 2020-08-25 MED ORDER — ALBUTEROL SULFATE (2.5 MG/3ML) 0.083% IN NEBU
2.5000 mg | INHALATION_SOLUTION | Freq: Once | RESPIRATORY_TRACT | Status: AC
  Administered 2020-08-25: 2.5 mg via RESPIRATORY_TRACT

## 2020-08-30 ENCOUNTER — Other Ambulatory Visit: Payer: Self-pay | Admitting: Internal Medicine

## 2020-09-01 ENCOUNTER — Ambulatory Visit: Payer: Medicare Other | Admitting: Internal Medicine

## 2020-09-01 ENCOUNTER — Other Ambulatory Visit: Payer: Self-pay

## 2020-09-01 ENCOUNTER — Encounter: Payer: Self-pay | Admitting: Internal Medicine

## 2020-09-01 DIAGNOSIS — F17210 Nicotine dependence, cigarettes, uncomplicated: Secondary | ICD-10-CM

## 2020-09-01 DIAGNOSIS — J449 Chronic obstructive pulmonary disease, unspecified: Secondary | ICD-10-CM

## 2020-09-01 DIAGNOSIS — J9612 Chronic respiratory failure with hypercapnia: Secondary | ICD-10-CM

## 2020-09-01 DIAGNOSIS — J9611 Chronic respiratory failure with hypoxia: Secondary | ICD-10-CM

## 2020-09-01 MED ORDER — AZITHROMYCIN 250 MG PO TABS: ORAL_TABLET | ORAL | 0 refills | Status: DC

## 2020-09-01 MED ORDER — PREDNISONE 10 MG PO TABS
ORAL_TABLET | ORAL | 0 refills | Status: DC
Start: 2020-09-01 — End: 2020-10-01

## 2020-09-01 NOTE — Progress Notes (Signed)
Daniel Barker, male    DOB: 05/23/1946, 74 y.o.   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 moderna  Chief Complaint  Patient presents with  . Pulmonary Consult    Referred by [facility] for eval of COPD. Pt states he has been having trouble breathing for at least the past 10 years. He is SOB with just walking to his mailbox.   Dyspnea:  50 ft slt uphill to MB and that's about his limit/ worse in heat  Cough: rattling in am slt yellow  Sleep: bed is flat, several pillows SABA use: neb tid and rare saba  Rec Plan A = Automatic = Always=    Breztri Take 2 puffs first thing in am and then another 2 puffs about 12 hours later.  Work on inhaler technique:  Plan B = Backup (to supplement plan A, not to replace it) Only use your albuterol inhaler as a rescue medication   Plan C = Crisis (instead of Plan B but only if Plan B stops working) - only use your albuterol nebulizer if you first try Plan B   Prednisone 10 mg take  4 each am x 2 days,   2 each am x 2 days,  1 each am x 2 days and stop  Please schedule a follow up office visit in 6 weeks, call sooner if needed - bring your inhalers and your empty symbicort     07/09/2020  f/u ov/[facility]/Daniel Barker re: copd / still smoking some/ maint on breztri/ got 1st covid shot with second one due end of August 2021 Chief Complaint  Patient presents with  . Follow-up    No complaints   Dyspnea: still walking to MB  Cough: rattling/ mostly white in am  X frew  Sleeping: bed is flat 2 pillows  On back  SABA use: still  too much  02:none  Has POC not using  rec Plan A = Automatic = Always=    Breztri (or Symbicort a week prior to next bladder doctor)  Take 2 puffs first thing in am and then another 2 puffs about 12 hours later.  Work on inhaler technique:  Plan B = Backup (to supplement plan A, not to replace it) Only use your albuterol inhaler as a rescue medication   Plan C = Crisis (instead of Plan B but only if Plan B stops working) - only use your albuterol nebulizer if you first try Plan B   Prednisone 10 mg take  4 each am x 2 days,   2 each am x 2 days,  1 each am x 2 days and stop  Please schedule a follow up office visit in 6-8  Weeks with PFT on return , call sooner if needed    09/01/2020  f/u ov/[place] office/Daniel Barker re: GOLD IV/ breztri and way  too much nebs Chief Complaint  Patient presents with  . Follow-up    Pt states his cough and congestion has been worse since the last visit. He  also c/o increased SOB and wheezing. His cough is prod with large amounts of white sputum.   He is using his albuterol inhaler 3-4 x per day and neb with albuterol 2 x per day.   Dyspnea: says Walking thru stores s 02 [employer] slow pace = MMRC2 = can't walk a nl pace on a flat grade s sob but does fine slow and flat eg  Cough: white mucus  Sleeping: flat on back with 2 pillows  SABA use: way too much  saba in neb form  02: every once in a while p exertion / very poor insight    No obvious day to day or daytime variability or assoc  purulent sputum or mucus plugs or hemoptysis or cp or chest tightness,   or overt sinus or hb symptoms.   Sleeping  without nocturnal  or early am exacerbation  of respiratory  c/o's or need for noct saba. Also denies any obvious fluctuation of symptoms with weather or environmental changes or other aggravating or alleviating factors except as outlined above   No unusual exposure hx or h/o childhood pna/ asthma or knowledge of premature birth.  Current Allergies, Complete Past  Medical History, Past Surgical History, Family History, and Social History were reviewed in [employer] record.  ROS  The following are not active complaints unless bolded Hoarseness, sore throat, dysphagia, dental problems, itching, sneezing,  nasal congestion or discharge of excess mucus or purulent secretions, ear ache,   fever, chills, sweats, unintended wt loss or wt gain, classically pleuritic or exertional cp,  orthopnea pnd or arm/hand swelling  or leg swelling, presyncope, palpitations, abdominal pain, anorexia, nausea, vomiting, diarrhea  or change in bowel habits or change in bladder habits, change in stools or change in urine, dysuria, hematuria,  rash, arthralgias, visual complaints, headache, numbness, weakness or ataxia or problems with walking or coordination,  change in mood or  memory.        Current Meds  Medication Sig  . albuterol (PROVENTIL) (2.5 MG/3ML) 0.083% nebulizer solution INHALE THE CONTENTS OF 1 VIAL VIA NEBULIZER EVERY 6 HOURS AS NEEDED FOR WHEEZING OR SHORTNESS OF BREATH  . albuterol (VENTOLIN HFA) 108 (90 Base) MCG/ACT inhaler INHALE 1 TO 2 PUFFS INTO THE LUNGS EVERY 6 HOURS AS NEEDED FOR WHEEZING OR SHORTNESS OF BREATH  . amLODipine (NORVASC) 5 MG tablet Take 5 mg by mouth daily.  Marland Kitchen apixaban (ELIQUIS) 2.5 MG TABS tablet Take 2.5 mg by mouth 2 (two) times daily.  . Budeson-Glycopyrrol-Formoterol (BREZTRI AEROSPHERE) 160-9-4.8 MCG/ACT AERO Inhale 2 puffs into the lungs in the morning and at bedtime.  . clopidogrel (PLAVIX) 75 MG tablet Take 75 mg by mouth daily.  . fenofibrate micronized (LOFIBRA) 134 MG capsule Take 134 mg by mouth daily before breakfast.  . montelukast (SINGULAIR) 10 MG tablet Take 10 mg by mouth at bedtime.  . propafenone (RYTHMOL) 225 MG tablet Take 225 mg by mouth every 8 (eight) hours.  . tamsulosin (FLOMAX) 0.4 MG CAPS capsule Take 1 capsule (0.4 mg total) by mouth daily.  Marland Kitchen torsemide (DEMADEX) 100 MG tablet Take 100 mg  by mouth daily.                             Past Medical History:  Diagnosis Date  . Asthma   . Atrial fibrillation ([place])   . BPH (benign prostatic hyperplasia)   . Cigarette smoker   . CKD (chronic  kidney disease)   . COPD (chronic obstructive pulmonary disease) ([place])   . Coronary artery disease   . Cough   . Hx of CABG   . Hypercholesteremia   . Localized edema   . Stented coronary artery          Objective:     amb somber wm nad   09/01/2020        164   07/09/20 159 lb (72.1 kg)  07/08/20 160 lb (72.6 kg)  05/28/20 160 lb (72.6 kg)    Vital signs reviewed  09/01/2020  - Note at rest 02 sats  90% on RA     HEENT : pt wearing mask not removed for exam due to covid -19 concerns.    NECK :  without JVD/Nodes/TM/ nl carotid upstrokes bilaterally   LUNGS: no acc muscle use,  Mod barrel  contour chest wall with bilateral  Distant wheezes and  without cough on insp or exp maneuvers and mod  Hyperresonant  to  percussion bilaterally     CV:  RRR  no s3 or murmur or increase in P2, and no edema   ABD:  soft and nontender with pos mid insp Hoover's  in the supine position. No bruits or organomegaly appreciated, bowel sounds nl  MS:     ext warm without deformities, calf tenderness, cyanosis or clubbing No obvious joint restrictions   SKIN: warm and dry without lesions    NEURO:  alert, approp, nl sensorium with  no motor or cerebellar deficits apparent.                  Assessment

## 2020-09-01 NOTE — Patient Instructions (Addendum)
Plan A = Automatic = Always=    Breztri   Take 2 puffs first thing in am and then another 2 puffs about 12 hours later.    Work on inhaler technique:  relax and gently blow all the way out then take a nice smooth deep breath back in, triggering the inhaler at same time you start breathing in.  Hold for up to 5 seconds if you can. Blow out thru nose. Rinse and gargle with water when done    Plan B = Backup (to supplement plan A, not to replace it) Only use your albuterol inhaler as a rescue medication to be used if you can't catch your breath by resting or doing a relaxed purse lip breathing pattern.  - The less you use it, the better it will work when you need it. - Ok to use the inhaler up to 2 puffs  every 4 hours if you must but call for appointment if use goes up over your usual need - Don't leave home without it !!  (think of it like the spare tire for your car)   Plan C = Crisis (instead of Plan B but only if Plan B stops working) - only use your albuterol nebulizer if you first try Plan B and it fails to help > ok to use the nebulizer up to every 4 hours but if start needing it regularly call for immediate appointment  Goal is to keep your 02 level at or above 90% with activity so measure it while are you are walking and let us know if want another source of portable 02 different from what you have.  zpak  Prednisone 10 mg Take 4 for three days 3 for three days 2 for three days 1 for three days and stop    Please schedule a follow up visit in 3 months but call sooner if needed  with all medications /inhalers/ solutions in hand so we can verify exactly what you are taking. This includes all medications from all doctors and over the counters

## 2020-09-02 ENCOUNTER — Encounter: Payer: Self-pay | Admitting: Internal Medicine

## 2020-09-02 DIAGNOSIS — J9611 Chronic respiratory failure with hypoxia: Secondary | ICD-10-CM | POA: Insufficient documentation

## 2020-09-02 NOTE — Assessment & Plan Note (Addendum)
Counseled re importance of smoking cessation but did not meet time criteria for separate billing    I had an extended discussion with the patient and reviewed all relevant studies so total time was > 40  minutes with moderate level of MDM.  Each maintenance medication was reviewed in detail including most importantly the difference between maintenance and prns and under what circumstances the prns are to be triggered using an action plan format that is not reflected in the computer generated alphabetically organized AVS.     Please see AVS for specific instructions unique to this visit that I personally wrote and verbalized to the the pt in detail and then reviewed with pt  by my nurse highlighting any  changes in therapy recommended at today's visit to their plan of care.

## 2020-09-02 NOTE — Assessment & Plan Note (Addendum)
Still smoking a few daily  - 07/09/2020  After extensive coaching inhaler device,  effectiveness =    75% (short Ti) - 07/09/2020 rec symbicort instead of  breztri due to bladder outlet obst but once foley out successully ok to try breztri - PFT's  08/25/20   FEV1 0.33 (12 % ) ratio 0.30  p 0 % improvement from saba with  FV curve severe concavity  p neb prior to study    Group D in terms of symptom/risk and laba/lama/ICS  therefore appropriate rx at this point >>>  breztri but needs to understand how and when to use this vs saba   - The proper method of use, as well as anticipated side effects, of a metered-dose inhaler were discussed and demonstrated to the patient using teach back method. Improved effectiveness after extensive coaching during this visit to a level of approximately 75 % from a baseline of 50 % > continue breztri with option of neb rx same components if insurance will cover as prefers neb   Re saba: I spent extra time with pt today reviewing appropriate use of albuterol for prn use on exertion with the following points: 1) saba is for relief of sob that does not improve by walking a slower pace or resting but rather if the pt does not improve after trying this first. 2) If the pt is convinced, as many are, that saba helps recover from activity faster then it's easy to tell if this is the case by re-challenging : ie stop, take the inhaler, then p 5 minutes try the exact same activity (intensity of workload) that just caused the symptoms and see if they are substantially diminished or not after saba 3) if there is an activity that reproducibly causes the symptoms, try the saba 15 min before the activity on alternate days   If in fact the saba really does help, then fine to continue to use it prn but advised may need to look closer at the maintenance regimen being used to achieve better control of airways disease with exertion.    Re flare rec zpak plus pred x 12 day taper and regroup in  3 m, sooner if needed

## 2020-09-02 NOTE — Assessment & Plan Note (Signed)
HC03   06/08/19  = 37   sats ok at rest and he has POC but doesn't think it's working correctly  Advised to try using when sats drop which would be likely while walking based on resting sats 90% today and if his POC corrects the walking sats back to 90% or higher then it's working/ o/w return here for POC certification/ replacement or more appropriate form of amb 02 to meet this need

## 2020-10-01 ENCOUNTER — Other Ambulatory Visit: Payer: Self-pay | Admitting: Internal Medicine

## 2020-10-01 NOTE — Telephone Encounter (Signed)
Pt is requesting refill on : PREDNISONE 10MG ** TABLETS next ov 11/30/20

## 2020-10-02 NOTE — Telephone Encounter (Signed)
Pt was informed prednisone was sent to pharmacy and available for pick up verbalized understanding

## 2020-10-03 ENCOUNTER — Other Ambulatory Visit: Payer: Self-pay | Admitting: Internal Medicine

## 2020-10-06 ENCOUNTER — Other Ambulatory Visit: Payer: Self-pay | Admitting: Internal Medicine

## 2020-10-15 ENCOUNTER — Other Ambulatory Visit: Payer: Self-pay | Admitting: Internal Medicine

## 2020-10-27 ENCOUNTER — Encounter: Payer: Self-pay | Admitting: Internal Medicine

## 2020-10-27 ENCOUNTER — Ambulatory Visit: Payer: Medicare Other | Admitting: Internal Medicine

## 2020-10-27 ENCOUNTER — Other Ambulatory Visit: Payer: Self-pay

## 2020-10-27 VITALS — BP 160/80 | HR 82 | Temp 97.4°F | Ht 66.0 in | Wt 168.0 lb

## 2020-10-27 DIAGNOSIS — J9612 Chronic respiratory failure with hypercapnia: Secondary | ICD-10-CM

## 2020-10-27 DIAGNOSIS — J449 Chronic obstructive pulmonary disease, unspecified: Secondary | ICD-10-CM | POA: Diagnosis not present

## 2020-10-27 DIAGNOSIS — J9611 Chronic respiratory failure with hypoxia: Secondary | ICD-10-CM | POA: Diagnosis not present

## 2020-10-27 MED ORDER — ALBUTEROL SULFATE HFA 108 (90 BASE) MCG/ACT IN AERS: INHALATION_SPRAY | RESPIRATORY_TRACT | 11 refills | Status: DC

## 2020-10-27 MED ORDER — PREDNISONE 10 MG PO TABS: ORAL_TABLET | ORAL | 0 refills | Status: DC

## 2020-10-27 MED ORDER — BUDESONIDE-FORMOTEROL FUMARATE 160-4.5 MCG/ACT IN AERO: INHALATION_SPRAY | RESPIRATORY_TRACT | 11 refills | Status: DC

## 2020-10-27 NOTE — Patient Instructions (Addendum)
Plan A = Automatic = Always=    Symbicort 160   Take 2 puffs first thing in am and then another 2 puffs about 12 hours later.   Work on inhaler technique:  relax and gently blow all the way out then take a nice smooth deep breath back in, triggering the inhaler at same time you start breathing in.  Hold for up to 5 seconds if you can. Blow out thru nose. Rinse and gargle with water when done  Use the empty cannister of symbicort to help you train on inhaler technique  Prednisone 10 mg take  4 each am x 2 days,   2 each am x 2 days,  1 each am x 2 days and stop     Plan B = Backup (to supplement plan A, not to replace it) Only use your albuterol inhaler as a rescue medication to be used if you can't catch your breath by resting or doing a relaxed purse lip breathing pattern.  - The less you use it, the better it will work when you need it. - Ok to use the inhaler up to 2 puffs  every 4 hours if you must but call for appointment if use goes up over your usual need - Don't leave home without it !!  (think of it like the spare tire for your car)   Plan C = Crisis (instead of Plan B but only if Plan B stops working) - only use your albuterol nebulizer if you first try Plan B and it fails to help > ok to use the nebulizer up to every 4 hours but if start needing it regularly call for immediate appointment  Ok to Try albuterol 15 min before an activity that you know would make you short of breath and see if it makes any difference and if makes none then don't take it after activity unless you can't catch your breath.      Make sure you check your oxygen saturations at highest level of activity to be sure it stays over 90% and keep track of it at least once a week, more often if breathing getting worse, and let me know if losing ground.   We will order: Overnight oximetry on Room air Home nebulizer     Please schedule a follow up visit in 3 months but call sooner if needed  with all medications  /inhalers/ solutions in hand so we can verify exactly what you are taking. This includes all medications from all doctors and over the counters

## 2020-10-27 NOTE — Assessment & Plan Note (Addendum)
Quit smoking 08/2020  - 07/09/2020  After extensive coaching inhaler device,  effectiveness =    75% (short Ti) - 07/09/2020 rec symbicort instead of  breztri due to bladder outlet obst but once foley out successully ok to try breztri - PFT's  08/25/20   FEV1 0.33 (12 % ) ratio 0.30  p 0 % improvement from saba with  FV curve severe concavity  p neb prior to study  Very severe copd by pft.   Group D in terms of symptom/risk and laba/lama/ICS  therefore appropriate rx at this point >>>  Can't afford breztri for now so ok to resume symbicort 160 2 bid and approp saba:  I spent extra time with pt today reviewing appropriate use of albuterol for prn use on exertion with the following points: 1) saba is for relief of sob that does not improve by walking a slower pace or resting but rather if the pt does not improve after trying this first. 2) If the pt is convinced, as many are, that saba helps recover from activity faster then it's easy to tell if this is the case by re-challenging : ie stop, take the inhaler, then p 5 minutes try the exact same activity (intensity of workload) that just caused the symptoms and see if they are substantially diminished or not after saba 3) if there is an activity that reproducibly causes the symptoms, try the saba 15 min before the activity on alternate days   If in fact the saba really does help, then fine to continue to use it prn but advised may need to look closer at the maintenance regimen being used to achieve better control of airways disease with exertion.    For mild flare related ? To ICS w/d rec Prednisone 10 mg take  4 each am x 2 days,   2 each am x 2 days,  1 each am x 2 days and stop and resume symbicort 160 2bid plus provide neb for ABC action plan  - see avs for instructions unique to this ov
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
## 2020-10-27 NOTE — Assessment & Plan Note (Addendum)
HC03   06/08/19  = 37 - ono RA ordered 10/27/2020   At this point does not need amb 02 but was on it hs - having nothing clinically to suggest noct desat now but at risk therefore rec repeat the ONO or RA and if > 5 min qualifies for noct but not amb 02 for now which may be related to recent smoking cessation.  Advised: Make sure you check your oxygen saturations at highest level of activity to be sure it stays over 90% and keep track of it at least once a week, more often if breathing getting worse, and let me know if losing ground.          Each maintenance medication was reviewed in detail including emphasizing most importantly the difference between maintenance and prns and under what circumstances the prns are to be triggered using an action plan format where appropriate.  Total time for H and P, chart review, counseling, teaching device and generating customized AVS unique to this office visit / charting  > 30 min

## 2020-11-03 ENCOUNTER — Telehealth: Payer: Self-pay | Admitting: Internal Medicine

## 2020-11-03 NOTE — Telephone Encounter (Signed)
Sent high priority message to [employer]. I tried to call Melissa but couldn't leave a message

## 2020-11-04 DIAGNOSIS — J449 Chronic obstructive pulmonary disease, unspecified: Secondary | ICD-10-CM | POA: Diagnosis not present

## 2020-11-04 DIAGNOSIS — J9610 Chronic respiratory failure, unspecified whether with hypoxia or hypercapnia: Secondary | ICD-10-CM | POA: Diagnosis not present

## 2020-11-04 NOTE — Telephone Encounter (Signed)
snt another message to [facility] for update Joellen Jersey

## 2020-11-06 NOTE — Telephone Encounter (Signed)
Received a message back from [facility] and pt's neb machine was shipped to him and according to ups delivery he should get it today Daniel Barker

## 2020-11-06 NOTE — Telephone Encounter (Signed)
Spoke to pt he still has not heard from [facility] about his neb machine I sent a priority message to [employer] to find out why Joellen Jersey

## 2020-11-09 NOTE — Progress Notes (Deleted)
CARDIOLOGY CONSULT NOTE       Patient ID: Daniel Barker MRN: [id number] DOB/AGE: 74-Sep-1947 74 y.o.  Admit date: (Not on file) Referring Physician: [name] Primary Physician: [name], MD Primary Cardiologist: New Reason for Consultation: CAD/CABG Dyspnea   Active Problems:   * No active hospital problems. *   HPI:  74 y.o. referred by Dr [name] for CAD ? History of CABG and dyspnea Unfortunately patient recently moved to [facility] from [place] area  and we have no old records. He has BPH and has recently had retention requiring foley with obstruction and change out in ER May. He is a smoker with apparent COPD He carries diagnosis of BPH, HLD, HTN, CRF/Edema, COPD/Asthma, CAD and PAF   Myovue 02/10/19 normal no ischemia some SVT/PAF during study  He indicates having a new stent placed last year ? Graft failure  He has no angina  His biggest issues are COPD with ongoing smoking, Dementia and prostatism  His wife has dementia and they moved her to live with step daughter Patients memory seems very poor on initial interview as well   He refuses to get COVID vaccine Discussed at length.   ROS All other systems reviewed and negative except as noted above  Past Medical History:  Diagnosis Date  . Asthma   . Atrial fibrillation ([place])   . BPH (benign prostatic hyperplasia)   . Cigarette smoker   . CKD (chronic kidney disease)   . COPD (chronic obstructive pulmonary disease) ([place])   . Coronary artery disease   . Cough   . High cholesterol   . Hx of CABG   . Hypercholesteremia   . Hypertension   . Localized edema   . Stented coronary artery     Family History  Problem Relation Age of Onset  . Hypertension Mother   . Clotting disorder Mother   . COPD Father     Social History   Socioeconomic History  . Marital status: Married    Spouse name: Not on file  . Number of children: 3  . Years of education: Not on file  . Highest education level: Not on file  Occupational  History  . Not on file  Tobacco Use  . Smoking status: Former Smoker    Packs/day: 1.00    Years: 60.00    Pack years: 60.00    Types: Cigarettes    Quit date: 09/01/2020    Years since quitting: 0.1  . Smokeless tobacco: Never Used  Vaping Use  . Vaping Use: Never used  Substance and Sexual Activity  . Alcohol use: Never  . Drug use: Never  . Sexual activity: Not on file  Other Topics Concern  . Not on file  Social History Narrative  . Not on file   Social Determinants of Health   Financial Resource Strain: Not on file  Food Insecurity: Not on file  Transportation Needs: Not on file  Physical Activity: Not on file  Stress: Not on file  Social Connections: Not on file  Intimate Partner Violence: Not on file    Past Surgical History:  Procedure Laterality Date  . CARDIAC SURGERY    . CORONARY ARTERY BYPASS GRAFT        Current Outpatient Medications:  .  albuterol (PROAIR HFA) 108 (90 Base) MCG/ACT inhaler, 2 puffs every 4 hours as needed only  if your can't catch your breath, Disp: 18 g, Rfl: 11 .  albuterol (PROVENTIL) (2.5 MG/3ML) 0.083% nebulizer solution,  INHALE THE CONTENTS OF 1 VIAL VIA NEBULIZER EVERY 6 HOURS AS NEEDED FOR WHEEZING OR SHORTNESS OF BREATH, Disp: 150 mL, Rfl: 1 .  amLODipine (NORVASC) 5 MG tablet, Take 5 mg by mouth daily., Disp: , Rfl:  .  apixaban (ELIQUIS) 2.5 MG TABS tablet, Take 2.5 mg by mouth 2 (two) times daily., Disp: , Rfl:  .  budesonide-formoterol (SYMBICORT) 160-4.5 MCG/ACT inhaler, Take 2 puffs first thing in am and then another 2 puffs about 12 hours later., Disp: 1 each, Rfl: 11 .  clopidogrel (PLAVIX) 75 MG tablet, Take 75 mg by mouth daily., Disp: , Rfl:  .  fenofibrate micronized (LOFIBRA) 134 MG capsule, Take 134 mg by mouth daily before breakfast., Disp: , Rfl:  .  montelukast (SINGULAIR) 10 MG tablet, Take 10 mg by mouth at bedtime., Disp: , Rfl:  .  predniSONE (DELTASONE) 10 MG tablet, Take  4 each am x 2 days,   2 each am x  2 days,  1 each am x 2 days and stop, Disp: 14 tablet, Rfl: 0 .  propafenone (RYTHMOL) 225 MG tablet, Take 225 mg by mouth every 8 (eight) hours., Disp: , Rfl:  .  tamsulosin (FLOMAX) 0.4 MG CAPS capsule, Take 1 capsule (0.4 mg total) by mouth daily., Disp: 30 capsule, Rfl: 11 .  torsemide (DEMADEX) 100 MG tablet, Take 100 mg by mouth daily., Disp: , Rfl:     Physical Exam: There were no vitals taken for this visit.   Affect appropriate Healthy:  appears stated age 74: normal Neck supple with no adenopathy JVP normal no bruits no thyromegaly Lungs clear with no wheezing and good diaphragmatic motion Heart:  S1/S2 no murmur, no rub, gallop or click PMI normal post CABG  Abdomen: benighn, BS positve, no tenderness, no AAA no bruit.  No HSM or HJR Distal pulses intact with no bruits No edema Neuro non-focal Skin warm and dry No muscular weakness      Radiology: No results found.  EKG: SR rate 80 nonspecific ST changes septal infarct    ASSESSMENT AND PLAN:   1. CAD/CABG:  Will try to get more records from [place]. He has no angina and non ischemic myovue 2020 Post CABG with ? Stenting to one of his grafts post op  continue current meds   2. Dyspnea:  Smoker with COPD Seen by Dr [name] in [place]  EF historically normal CXR 05/15/20 Bronchitic changes Lung cancer screening CT ordered   3. Urology:  Retention with indwelling foley f/u urology  4. Dementia:  F/u primary his memory would appear to be very poor   F/u with cardiology in 6 months Lung cancer screening CT  Signed: Jenkins Rouge 11/09/2020, 12:57 PM

## 2020-11-10 ENCOUNTER — Encounter: Payer: Self-pay | Admitting: Internal Medicine

## 2020-11-10 DIAGNOSIS — G4730 Sleep apnea, unspecified: Secondary | ICD-10-CM | POA: Diagnosis not present

## 2020-11-10 DIAGNOSIS — R0683 Snoring: Secondary | ICD-10-CM | POA: Diagnosis not present

## 2020-11-16 ENCOUNTER — Ambulatory Visit: Payer: Medicare Other | Admitting: Cardiovascular Disease

## 2020-11-25 ENCOUNTER — Other Ambulatory Visit: Payer: Self-pay | Admitting: Internal Medicine

## 2020-11-25 MED ORDER — ALBUTEROL SULFATE (2.5 MG/3ML) 0.083% IN NEBU: INHALATION_SOLUTION | RESPIRATORY_TRACT | 1 refills | Status: DC

## 2020-11-28 ENCOUNTER — Telehealth: Payer: Self-pay | Admitting: Pulmonary Disease

## 2020-11-28 ENCOUNTER — Other Ambulatory Visit: Payer: Self-pay | Admitting: Pulmonary Disease

## 2020-11-28 MED ORDER — PREDNISONE 20 MG PO TABS: 40.0000 mg | ORAL_TABLET | Freq: Every day | ORAL | 0 refills | Status: DC

## 2020-11-28 MED ORDER — DOXYCYCLINE HYCLATE 100 MG PO TABS
100.0000 mg | ORAL_TABLET | Freq: Two times a day (BID) | ORAL | 0 refills | Status: DC
Start: 2020-11-28 — End: 2020-12-08

## 2020-11-28 NOTE — Telephone Encounter (Signed)
Patient called into answering service for shortness of breath worsening for the last few days  Feels he may have an exacerbation of his COPD  Prescription for doxycycline 100 p.o. twice daily for 7 days Prednisone 40 daily for 7 days

## 2020-11-28 NOTE — Progress Notes (Signed)
Prednisone and doxycycline called into pharmacy

## 2020-11-30 ENCOUNTER — Telehealth: Payer: Self-pay | Admitting: Internal Medicine

## 2020-11-30 DIAGNOSIS — J9611 Chronic respiratory failure with hypoxia: Secondary | ICD-10-CM

## 2020-11-30 NOTE — Telephone Encounter (Signed)
ONO on RA done by [facility] 11/10/20 reviewed by Dr [name]  Per Dr [name]- needs to start using o2 with sleep 2lpm and repeat ONO on 2lpm  Pt aware and orders sent to [facility]

## 2020-12-04 ENCOUNTER — Emergency Department (HOSPITAL_COMMUNITY): Payer: Medicare Other

## 2020-12-04 ENCOUNTER — Emergency Department (HOSPITAL_COMMUNITY)
Admission: EM | Admit: 2020-12-04 | Discharge: 2020-12-04 | Disposition: A | Discharge status: Home / Self Care | Payer: Medicare Other | Attending: Emergency Medicine | Admitting: Emergency Medicine

## 2020-12-04 ENCOUNTER — Encounter (HOSPITAL_COMMUNITY): Payer: Self-pay

## 2020-12-04 ENCOUNTER — Other Ambulatory Visit: Payer: Self-pay

## 2020-12-04 DIAGNOSIS — I2510 Atherosclerotic heart disease of native coronary artery without angina pectoris: Secondary | ICD-10-CM | POA: Insufficient documentation

## 2020-12-04 DIAGNOSIS — S0990XA Unspecified injury of head, initial encounter: Secondary | ICD-10-CM

## 2020-12-04 DIAGNOSIS — J449 Chronic obstructive pulmonary disease, unspecified: Secondary | ICD-10-CM | POA: Diagnosis not present

## 2020-12-04 DIAGNOSIS — Z7951 Long term (current) use of inhaled steroids: Secondary | ICD-10-CM | POA: Insufficient documentation

## 2020-12-04 DIAGNOSIS — G9389 Other specified disorders of brain: Secondary | ICD-10-CM | POA: Diagnosis not present

## 2020-12-04 DIAGNOSIS — G319 Degenerative disease of nervous system, unspecified: Secondary | ICD-10-CM | POA: Diagnosis not present

## 2020-12-04 DIAGNOSIS — Z23 Encounter for immunization: Secondary | ICD-10-CM | POA: Insufficient documentation

## 2020-12-04 DIAGNOSIS — S0181XA Laceration without foreign body of other part of head, initial encounter: Secondary | ICD-10-CM | POA: Insufficient documentation

## 2020-12-04 DIAGNOSIS — I1310 Hypertensive heart and chronic kidney disease without heart failure, with stage 1 through stage 4 chronic kidney disease, or unspecified chronic kidney disease: Secondary | ICD-10-CM | POA: Diagnosis not present

## 2020-12-04 DIAGNOSIS — N189 Chronic kidney disease, unspecified: Secondary | ICD-10-CM | POA: Diagnosis not present

## 2020-12-04 DIAGNOSIS — W01198A Fall on same level from slipping, tripping and stumbling with subsequent striking against other object, initial encounter: Secondary | ICD-10-CM | POA: Insufficient documentation

## 2020-12-04 DIAGNOSIS — S01112A Laceration without foreign body of left eyelid and periocular area, initial encounter: Secondary | ICD-10-CM | POA: Diagnosis not present

## 2020-12-04 DIAGNOSIS — J45909 Unspecified asthma, uncomplicated: Secondary | ICD-10-CM | POA: Insufficient documentation

## 2020-12-04 DIAGNOSIS — Z79899 Other long term (current) drug therapy: Secondary | ICD-10-CM | POA: Insufficient documentation

## 2020-12-04 DIAGNOSIS — S42342A Displaced spiral fracture of shaft of humerus, left arm, initial encounter for closed fracture: Secondary | ICD-10-CM | POA: Insufficient documentation

## 2020-12-04 DIAGNOSIS — Z87891 Personal history of nicotine dependence: Secondary | ICD-10-CM | POA: Diagnosis not present

## 2020-12-04 DIAGNOSIS — Z955 Presence of coronary angioplasty implant and graft: Secondary | ICD-10-CM | POA: Diagnosis not present

## 2020-12-04 DIAGNOSIS — S4992XA Unspecified injury of left shoulder and upper arm, initial encounter: Secondary | ICD-10-CM | POA: Diagnosis present

## 2020-12-04 DIAGNOSIS — Z7901 Long term (current) use of anticoagulants: Secondary | ICD-10-CM | POA: Insufficient documentation

## 2020-12-04 DIAGNOSIS — I4891 Unspecified atrial fibrillation: Secondary | ICD-10-CM | POA: Diagnosis not present

## 2020-12-04 MED ORDER — POVIDONE-IODINE 10 % EX SOLN
CUTANEOUS | Status: DC | PRN
  Filled 2020-12-04: qty 15

## 2020-12-04 MED ORDER — TETANUS-DIPHTH-ACELL PERTUSSIS 5-2.5-18.5 LF-MCG/0.5 IM SUSY
0.5000 mL | PREFILLED_SYRINGE | Freq: Once | INTRAMUSCULAR | Status: AC
  Administered 2020-12-04: 0.5 mL via INTRAMUSCULAR
  Filled 2020-12-04: qty 0.5

## 2020-12-04 MED ORDER — HYDROCODONE-ACETAMINOPHEN 5-325 MG PO TABS
1.0000 | ORAL_TABLET | Freq: Once | ORAL | Status: AC
  Administered 2020-12-04: 1 via ORAL
  Filled 2020-12-04: qty 1

## 2020-12-04 MED ORDER — HYDROCODONE-ACETAMINOPHEN 5-325 MG PO TABS: 1.0000 | ORAL_TABLET | ORAL | 0 refills | Status: DC | PRN
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
## 2020-12-04 NOTE — ED Provider Notes (Signed)
[facility] EMERGENCY DEPARTMENT Provider Note   CSN: [id number] Arrival date & time: 12/04/20  1152     History Chief Complaint  Patient presents with   Daniel Barker is a 75 y.o. male with a history as outlined below, most significant for atrial fibrillation, chronic kidney disease, COPD, CAD with a history of CABG, hypertension, on chronic Plavix, presenting for evaluation of injury sustained in a fall.  He tripped over a hose outdoors prior to arrival landing on his left upper extremity and hit his left temple region against the edge of his car causing skin laceration to his left temple region.  He has significant pain and deformity of his left upper arm.  He denies numbness distal to the injury site of his left arm.  He also denies LOC.  He has had no treatment prior to arrival.  He is unsure of his last tetanus update.  He denies any other complaint of pain including chest, back, hip or lower extremity pain.  Patient recently relocated here from [place] to be closer to family.  He is caring for a wife with dementia and is requiring family assistance with her currently.  He is pending establishment of primary care with Dr. [name].  HPI     Past Medical History:  Diagnosis Date   Asthma    Atrial fibrillation (HCC)    BPH (benign prostatic hyperplasia)    Cigarette smoker    CKD (chronic kidney disease)    COPD (chronic obstructive pulmonary disease) (HCC)    Coronary artery disease    Cough    High cholesterol    Hx of CABG    Hypercholesteremia    Hypertension    Localized edema    Stented coronary artery     Patient Active Problem List   Diagnosis Date Noted   Chronic respiratory failure with hypoxia and hypercapnia ([place]) 09/02/2020   Urinary retention 07/08/2020   Benign prostatic hyperplasia with urinary obstruction 07/08/2020   COPD GOLD IV / group D  05/28/2020   Cigarette smoker 05/28/2020    Past Surgical History:  Procedure  Laterality Date   CARDIAC SURGERY     CORONARY ARTERY BYPASS GRAFT         Family History  Problem Relation Age of Onset   Hypertension Mother    Clotting disorder Mother    COPD Father     Social History   Tobacco Use   Smoking status: Former Smoker    Packs/day: 1.00    Years: 60.00    Pack years: 60.00    Types: Cigarettes    Quit date: 09/01/2020    Years since quitting: 0.2   Smokeless tobacco: Never Used  Vaping Use   Vaping Use: Never used  Substance Use Topics   Alcohol use: Never   Drug use: Never    Home Medications Prior to Admission medications   Medication Sig Start Date End Date Taking? Authorizing Provider  albuterol (PROAIR HFA) 108 (90 Base) MCG/ACT inhaler 2 puffs every 4 hours as needed only  if your can't catch your breath 10/27/20   Tanda Rockers, MD  albuterol (PROVENTIL) (2.5 MG/3ML) 0.083% nebulizer solution INHALE THE CONTENTS OF 1 VIAL VIA NEBULIZER EVERY 6 HOURS AS NEEDED FOR WHEEZING OR SHORTNESS OF BREATH 11/25/20   Tanda Rockers, MD  amLODipine (NORVASC) 5 MG tablet Take 5 mg by mouth daily.    [provider]  apixaban (ELIQUIS) 2.5 MG TABS tablet  Take 2.5 mg by mouth 2 (two) times daily.    [provider]  budesonide-formoterol (SYMBICORT) 160-4.5 MCG/ACT inhaler Take 2 puffs first thing in am and then another 2 puffs about 12 hours later. 10/27/20   Tanda Rockers, MD  clopidogrel (PLAVIX) 75 MG tablet Take 75 mg by mouth daily.    [provider]  doxycycline (VIBRA-TABS) 100 MG tablet Take 1 tablet (100 mg total) by mouth 2 (two) times daily. 11/28/20   Laurin Coder, MD  fenofibrate micronized (LOFIBRA) 134 MG capsule Take 134 mg by mouth daily before breakfast.    [provider]  HYDROcodone-acetaminophen (NORCO/VICODIN) 5-325 MG tablet Take 1 tablet by mouth every 4 (four) hours as needed for moderate pain. 12/04/20   Evalee Jefferson, PA-C  montelukast (SINGULAIR) 10 MG tablet Take 10 mg  by mouth at bedtime.    [provider]  predniSONE (DELTASONE) 20 MG tablet Take 2 tablets (40 mg total) by mouth daily with breakfast. 11/28/20   Olalere, Adewale A, MD  propafenone (RYTHMOL) 225 MG tablet Take 225 mg by mouth every 8 (eight) hours.    [provider]  tamsulosin (FLOMAX) 0.4 MG CAPS capsule Take 1 capsule (0.4 mg total) by mouth daily. 07/08/20   McKenzie, Candee Furbish, MD  torsemide (DEMADEX) 100 MG tablet Take 100 mg by mouth daily.    [provider]    Allergies    Lipitor [atorvastatin]  Review of Systems   Review of Systems  Constitutional: Negative for fever.  HENT: Negative.   Eyes: Negative.   Respiratory: Negative for chest tightness and shortness of breath.   Cardiovascular: Negative for chest pain.  Gastrointestinal: Negative for abdominal pain, nausea and vomiting.  Genitourinary: Negative.   Musculoskeletal: Positive for arthralgias. Negative for joint swelling and neck pain.  Skin: Positive for wound. Negative for rash.  Neurological: Negative for dizziness, syncope, weakness, light-headedness, numbness and headaches.  Psychiatric/Behavioral: Negative.     Physical Exam Updated Vital Signs BP (!) 158/83    Pulse 74    Temp 97.8 F (36.6 C) (Oral)    Resp 18    Ht 5' 6.5" (1.689 m)    Wt 76.7 kg    SpO2 91%    BMI 26.87 kg/m   Physical Exam Vitals and nursing note reviewed.  Constitutional:      Appearance: He is well-developed and well-nourished.  HENT:     Head: Normocephalic. Laceration present.     Comments: 2 cm superficial laceration left cheek with proximal/superior end superficial abrasion that ends beneath the left lateral eyebrow.  Hemostatic.  Eyes:     Conjunctiva/sclera: Conjunctivae normal.  Cardiovascular:     Rate and Rhythm: Normal rate and regular rhythm.     Pulses: Intact distal pulses.          Radial pulses are 2+ on the right side and 2+ on the left side.     Heart sounds: Normal heart sounds.   Pulmonary:     Effort: Pulmonary effort is normal.     Breath sounds: Normal breath sounds. No wheezing.  Abdominal:     General: Bowel sounds are normal.     Palpations: Abdomen is soft.     Tenderness: There is no abdominal tenderness.  Musculoskeletal:        General: Normal range of motion.     Left upper arm: Swelling, deformity and tenderness present.     Cervical back: Normal range of motion.  Comments: No left clavicle, elbow forearm, hand or wrist pain or swelling. Neck nontender with FROM.   No pain of hips, knees, ankles.   Skin:    General: Skin is warm and dry.  Neurological:     Mental Status: He is alert.  Psychiatric:        Mood and Affect: Mood and affect normal.     ED Results / Procedures / Treatments   Labs (all labs ordered are listed, but only abnormal results are displayed) Labs Reviewed - No data to display  EKG None  Radiology CT Head Wo Contrast  Result Date: 12/04/2020 CLINICAL DATA:  [place] today hitting the right temporal region. Laceration to the lateral aspect of the right eye. EXAM: CT HEAD WITHOUT CONTRAST TECHNIQUE: Contiguous axial images were obtained from the base of the skull through the vertex without intravenous contrast. COMPARISON:  None. FINDINGS: Brain: No evidence of acute infarction, hemorrhage, hydrocephalus, extra-axial collection or mass lesion/mass effect. There is ventricular sulcal enlargement reflecting mild diffuse atrophy. Patchy areas of white matter hypoattenuation also noted consistent with mild chronic microvascular ischemic change. Vascular: No hyperdense vessel or unexpected calcification. Skull: Normal. Negative for fracture or focal lesion. Sinuses/Orbits: Visualized globes and orbits are unremarkable. Visualized sinuses are clear. Mastoid air cells are clear. Other: None. IMPRESSION: 1. No acute intracranial abnormalities. 2. Mild atrophy and chronic microvascular ischemic change. 3. No skull fracture. Electronically  Signed   By: Lajean Manes M.D.   On: 12/04/2020 13:38   DG Humerus Left  Result Date: 12/04/2020 CLINICAL DATA:  Left arm pain and deformity following a fall today. EXAM: LEFT HUMERUS - 2+ VIEW COMPARISON:  None. FINDINGS: Spiral fracture of the mid to proximal shaft of the left humerus with 1/2 shaft width of medial and 3/4 shaft width of anterior displacement of the distal fragment as well as mild medial angulation of the distal fragment. IMPRESSION: Spiral fracture of the mid to proximal shaft of the left humerus. Electronically Signed   By: Claudie Revering M.D.   On: 12/04/2020 13:47    Procedures Procedures (including critical care time)  SPLINT APPLICATION Date/Time: 8:58IF Authorized by: Evalee Jefferson Consent: Verbal consent obtained. Risks and benefits: risks, benefits and alternatives were discussed Consent given by: patient Splint applied by: RN Location details: left upper arm Splint type: sugar tong supporting humerus, sling Supplies used: webril, fiber splinting material, ace wraps, sling Post-procedure: The splinted body part was neurovascularly unchanged following the procedure. Patient tolerance: Patient tolerated the procedure well with no immediate complications.  LACERATION REPAIR Performed by: Evalee Jefferson Authorized by: Evalee Jefferson Consent: Verbal consent obtained. Risks and benefits: risks, benefits and alternatives were discussed Consent given by: patient Patient identity confirmed: provided demographic data Prepped and Draped in normal sterile fashion Wound explored  Laceration Location: periorbital  Laceration Length: 2cm  No Foreign Bodies seen or palpated  Anesthesia: n/a  Local anesthetic: n/a Anesthetic total: none  Irrigation method: 4x4's using sterile saline, followed by betadine  Amount of cleaning: standard  Skin closure: dermabond  Number of sutures: na, dermabond used  Technique: dermabond  Patient tolerance: Patient tolerated the  procedure well with no immediate complications.    Medications Ordered in ED Medications  povidone-iodine (BETADINE) 10 % external solution (has no administration in time range)  Tdap (BOOSTRIX) injection 0.5 mL (0.5 mLs Intramuscular Given 12/04/20 1403)  HYDROcodone-acetaminophen (NORCO/VICODIN) 5-325 MG per tablet 1 tablet (1 tablet Oral Given 12/04/20 1403)    ED Course  I have reviewed the triage vital signs and the nursing notes.  Pertinent labs & imaging results that were available during my care of the patient were reviewed by me and considered in my medical decision making (see chart for details).    MDM Rules/Calculators/A&P                          Pt with left mid shaft humeral fracture.  Discussed with Dr. [name] who recommended a U-shaped sugar tong supporting the humerus.  Sling also provided.  He will see patient in his office in close follow-up.  This was discussed with patient who understands and agrees with outpatient care and his need to call for an appointment time.  His stepdaughter at the bedside is also available and will help with arranging this.  We also discussed his wound care and Dermabond home treatment plan.  He was given a tetanus.  Patient was prescribed hydrocodone for pain relief.  Return precautions were discussed for any complications with his injury or the splint while he is awaiting follow-up care.  All imaging was reviewed and discussed with him and his stepdaughter at the bedside.  He has no internal head injuries. Final Clinical Impression(s) / ED Diagnoses Final diagnoses:  Closed displaced spiral fracture of shaft of left humerus, initial encounter  Minor head injury, initial encounter  Facial laceration, initial encounter    Rx / DC Orders ED Discharge Orders         Ordered    HYDROcodone-acetaminophen (NORCO/VICODIN) 5-325 MG tablet  Every 4 hours PRN,   Status:  Discontinued        12/04/20 1557    HYDROcodone-acetaminophen  (NORCO/VICODIN) 5-325 MG tablet  Every 4 hours PRN        12/04/20 1609           Evalee Jefferson, PA-C 12/04/20 1733    Long, Wonda Olds, MD 12/10/20 1028

## 2020-12-04 NOTE — ED Notes (Addendum)
Pt. Denies losing consciousness before or after fall. Pt. States their left arm only hurts when the attempt to move it.

## 2020-12-04 NOTE — Discharge Instructions (Signed)
Keep your splint clean and dry.  Wear the sling for comfort.  Call Dr. [name] for a recheck of your injury early next week.  You may take the hydrocodone prescribed for pain relief.  This will make you drowsy - do not drive within 4 hours of taking this medication.  Allow the dermabond to fall away from your facial laceration without picking at it.  It is ok to get it briefly wet for face washing.    Your tetanus has been updated today.  Return here over the weekend if you have any problems or complications with the splint or any other concerns related to this injury.

## 2020-12-08 ENCOUNTER — Ambulatory Visit (INDEPENDENT_AMBULATORY_CARE_PROVIDER_SITE_OTHER): Payer: Medicare Other | Admitting: Internal Medicine

## 2020-12-08 ENCOUNTER — Encounter: Payer: Self-pay | Admitting: Internal Medicine

## 2020-12-08 ENCOUNTER — Other Ambulatory Visit: Payer: Self-pay

## 2020-12-08 DIAGNOSIS — J9611 Chronic respiratory failure with hypoxia: Secondary | ICD-10-CM | POA: Diagnosis not present

## 2020-12-08 DIAGNOSIS — J9612 Chronic respiratory failure with hypercapnia: Secondary | ICD-10-CM

## 2020-12-08 DIAGNOSIS — J449 Chronic obstructive pulmonary disease, unspecified: Secondary | ICD-10-CM

## 2020-12-08 MED ORDER — PREDNISONE 10 MG PO TABS: ORAL_TABLET | ORAL | 0 refills | Status: DC

## 2020-12-08 NOTE — Assessment & Plan Note (Addendum)
Quit smoking 08/2020  - 07/09/2020  After extensive coaching inhaler device,  effectiveness =    75% (short Ti) - 07/09/2020 rec symbicort instead of  breztri due to bladder outlet obst but once foley out successully ok to try breztri - PFT's  08/25/20   FEV1 0.33 (12 % ) ratio 0.30  p 0 % improvement from saba with  FV curve severe concavity  p neb prior to study   - The proper method of use, as well as anticipated side effects, of a metered-dose inhaler were discussed and demonstrated to the patient using teach back method using empty symbicort device   Group D in terms of symptom/risk and laba/lama/ICS  therefore appropriate rx at this point >>>  symbicort plus pred best option since can't afford breztri  The goal with a chronic steroid dependent illness is always arriving at the lowest effective dose that controls the disease/symptoms and not accepting a set "formula" which is based on statistics or guidelines that don't always take into account patient  variability or the natural hx of the dz in every individual patient, which may well vary over time.  For now therefore I recommend the patient maintain  20 mg ceiling taper to 0 as tol  >>> also strongly advised moderna booster at this point         Each maintenance medication was reviewed in detail including emphasizing most importantly the difference between maintenance and prns and under what circumstances the prns are to be triggered using an action plan format where appropriate.  Total time for H and P, chart review, counseling,    directly observing portions of ambulatory 02 saturation study/   and generating customized AVS unique to this office visit / same day charting > 30 min
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
## 2020-12-08 NOTE — Patient Instructions (Addendum)
We will walk you today to see if you qualify for portable 02   Prednisone 10 mg x 2 daily until better then 1 daily x 5 days and stop.  If worse, start over.  Remember to use the empty symbicort container for training on how to use it correctly and bring it back with you   I very strongly recommend you get the moderna  Booster  based on your risk of dying from the virus  and the proven safety and benefit of these vaccines against even the delta and omicron variants.  This can save your life as well as  those of your loved ones,  especially if they are also not vaccinated.     Please schedule a follow up office visit in 6 weeks, call sooner if needed with all medications /inhalers/ solutions in hand so we can verify exactly what you are taking. This includes all medications from all doctors and over the counters  Add:  Needs alpha one screen on return

## 2020-12-08 NOTE — Assessment & Plan Note (Signed)
HC03   06/08/19  = 37 - ono RA  11/10/20  sats < 89% x 7h 33 min so rec 11/30/2020 = >>  needs 2lpm and repeat on 2lpm> refused 02 concentrator  - 12/08/2020 Patient Saturations on Room Air at Rest = 93% and while Ambulating = 85  on 2 Liters of oxygen while Ambulating = 92% > referred for amb 02

## 2020-12-09 ENCOUNTER — Encounter: Payer: Self-pay | Admitting: Orthopedic Surgery

## 2020-12-09 ENCOUNTER — Ambulatory Visit: Payer: Medicare Other | Admitting: Orthopedic Surgery

## 2020-12-09 ENCOUNTER — Other Ambulatory Visit: Payer: Self-pay

## 2020-12-09 VITALS — BP 160/83 | HR 93 | Ht 66.5 in | Wt 177.5 lb

## 2020-12-09 DIAGNOSIS — W010XXA Fall on same level from slipping, tripping and stumbling without subsequent striking against object, initial encounter: Secondary | ICD-10-CM

## 2020-12-09 DIAGNOSIS — S42342A Displaced spiral fracture of shaft of humerus, left arm, initial encounter for closed fracture: Secondary | ICD-10-CM
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
## 2020-12-10 ENCOUNTER — Ambulatory Visit: Payer: Medicare Other | Admitting: Orthopedic Surgery

## 2020-12-23 ENCOUNTER — Ambulatory Visit (INDEPENDENT_AMBULATORY_CARE_PROVIDER_SITE_OTHER): Payer: Medicare Other | Admitting: Orthopedic Surgery

## 2020-12-23 ENCOUNTER — Other Ambulatory Visit: Payer: Self-pay

## 2020-12-23 ENCOUNTER — Encounter: Payer: Self-pay | Admitting: Orthopedic Surgery

## 2020-12-23 ENCOUNTER — Ambulatory Visit: Payer: Medicare Other

## 2020-12-23 DIAGNOSIS — S42342D Displaced spiral fracture of shaft of humerus, left arm, subsequent encounter for fracture with routine healing: Secondary | ICD-10-CM

## 2020-12-23 DIAGNOSIS — S42342A Displaced spiral fracture of shaft of humerus, left arm, initial encounter for closed fracture: Secondary | ICD-10-CM | POA: Insufficient documentation

## 2020-12-23 MED ORDER — HYDROCODONE-ACETAMINOPHEN 5-325 MG PO TABS
1.0000 | ORAL_TABLET | Freq: Three times a day (TID) | ORAL | 0 refills | Status: AC | PRN
Start: 2020-12-23 — End: 2020-12-30

## 2020-12-23 NOTE — Progress Notes (Signed)
FRACTURE CARE FOLLOW UP   FOV: January 12  DOI: January 7  GLOBAL PERIOD: January 12 through April 12  Encounter Diagnosis  Name Primary?  . Closed displaced spiral fracture of shaft of left humerus with routine healing, subsequent encounter Yes    TREATMENT: Fracture cuff  75 year old male left-hand-dominant status post stent placement 2020 and CABG 2016 I believe presents with a fall tripped over a garden hose fractured his humerus presents for evaluation and management complains of pain  Daniel Barker in stable condition not having too much pain just occasionally at night  X-rays show less than 20 degrees fracture angulation in the planes including x-rays done in the brace  Recommend continue fracture cuff and x-ray in 4 weeks  Note radial nerve is intact

## 2020-12-31 NOTE — Progress Notes (Signed)
CARDIOLOGY CONSULT NOTE       Patient ID: Daniel Barker MRN: [id number] DOB/AGE: 75-Feb-1947 75 y.o.  Admit date: (Not on file) Referring Physician: [name] Primary Physician: [name], MD Primary Cardiologist: [name] Reason for Consultation: CAD/CABG Dyspnea   HPI:  75 y.o. referred by Dr [name] 05/19/20 for CAD ? History of CABG and dyspnea Unfortunately patient recently moved to [facility] from [place] area  and we have no old records. He has BPH and has recently had retention requiring foley with obstruction and change out in ER May. He is a smoker with apparent COPD He carries diagnosis of BPH, HLD, HTN, CRF/Edema, COPD/Asthma, CAD and PAF   Myovue 02/10/19 normal no ischemia some SVT/PAF during study  He indicates having a new stent placed last year ? Graft failure  He has no angina  His biggest issues are COPD with ongoing smoking, Dementia and prostatism  His wife has dementia and they moved her to live with step daughter Patients memory seems very poor on initial interview as well   He refuses to get COVID vaccine Discussed at length.   [place] and seen in ER 12/04/20 had left orbital sutures and left humeral fracture  And was to f/u with Dr [name] who recommended continued fracture cuff and radiologic f/u  Seeing Dr [name] and prescribed 2L oxygen for home but didn't enjoy encounter and still Needs to get smaller concentrator   ROS All other systems reviewed and negative except as noted above  Past Medical History:  Diagnosis Date  . Asthma   . Atrial fibrillation ([place])   . BPH (benign prostatic hyperplasia)   . Cigarette smoker   . CKD (chronic kidney disease)   . COPD (chronic obstructive pulmonary disease) ([place])   . Coronary artery disease   . Cough   . High cholesterol   . Hx of CABG   . Hypercholesteremia   . Hypertension   . Localized edema   . Stented coronary artery     Family History  Problem Relation Age of Onset  . Hypertension Mother   . Clotting  disorder Mother   . COPD Father     Social History   Socioeconomic History  . Marital status: Married    Spouse name: Not on file  . Number of children: 3  . Years of education: Not on file  . Highest education level: Not on file  Occupational History  . Not on file  Tobacco Use  . Smoking status: Former Smoker    Packs/day: 1.00    Years: 60.00    Pack years: 60.00    Types: Cigarettes    Quit date: 09/01/2020    Years since quitting: 0.3  . Smokeless tobacco: Never Used  Vaping Use  . Vaping Use: Never used  Substance and Sexual Activity  . Alcohol use: Never  . Drug use: Never  . Sexual activity: Not on file  Other Topics Concern  . Not on file  Social History Narrative  . Not on file   Social Determinants of Health   Financial Resource Strain: Not on file  Food Insecurity: Not on file  Transportation Needs: Not on file  Physical Activity: Not on file  Stress: Not on file  Social Connections: Not on file  Intimate Partner Violence: Not on file    Past Surgical History:  Procedure Laterality Date  . CARDIAC SURGERY    . CORONARY ARTERY BYPASS GRAFT        Current  Outpatient Medications:  .  albuterol (PROAIR HFA) 108 (90 Base) MCG/ACT inhaler, 2 puffs every 4 hours as needed only  if your can't catch your breath, Disp: 18 g, Rfl: 11 .  albuterol (PROVENTIL) (2.5 MG/3ML) 0.083% nebulizer solution, INHALE THE CONTENTS OF 1 VIAL VIA NEBULIZER EVERY 6 HOURS AS NEEDED FOR WHEEZING OR SHORTNESS OF BREATH, Disp: 150 mL, Rfl: 1 .  amLODipine (NORVASC) 5 MG tablet, Take 5 mg by mouth daily., Disp: , Rfl:  .  apixaban (ELIQUIS) 2.5 MG TABS tablet, Take 2.5 mg by mouth 2 (two) times daily., Disp: , Rfl:  .  budesonide-formoterol (SYMBICORT) 160-4.5 MCG/ACT inhaler, Take 2 puffs first thing in am and then another 2 puffs about 12 hours later., Disp: 1 each, Rfl: 11 .  clopidogrel (PLAVIX) 75 MG tablet, Take 75 mg by mouth daily., Disp: , Rfl:  .  fenofibrate micronized  (LOFIBRA) 134 MG capsule, Take 134 mg by mouth daily before breakfast., Disp: , Rfl:  .  montelukast (SINGULAIR) 10 MG tablet, Take 10 mg by mouth at bedtime., Disp: , Rfl:  .  predniSONE (DELTASONE) 10 MG tablet, 2 daily with breakfast until better then 1 daily x 5 days and stop, Disp: 100 tablet, Rfl: 0 .  propafenone (RYTHMOL SR) 225 MG 12 hr capsule, Take 225 mg by mouth every 8 (eight) hours., Disp: , Rfl:  .  propafenone (RYTHMOL) 225 MG tablet, Take 225 mg by mouth every 8 (eight) hours., Disp: , Rfl:  .  tamsulosin (FLOMAX) 0.4 MG CAPS capsule, Take 1 capsule (0.4 mg total) by mouth daily., Disp: 30 capsule, Rfl: 11 .  torsemide (DEMADEX) 100 MG tablet, Take 100 mg by mouth daily., Disp: , Rfl:     Physical Exam: There were no vitals taken for this visit.   Affect appropriate Chronically ill  HEENT: normal Neck supple with no adenopathy JVP normal no bruits no thyromegaly Lungs decreased BS with expiratory wheezing  Heart:  S1/S2 no murmur, no rub, gallop or click PMI normal post CABG  Abdomen: benighn, BS positve, no tenderness, no AAA no bruit.  No HSM or HJR Distal pulses intact with no bruits No edema Neuro non-focal Skin warm and dry Recent orbital sutures Left  Left humeral fracture sling     Radiology: DG Humerus Left  Result Date: 12/23/2020 Imaging of the left humerus status post for a mild to midshaft humerus fracture in the fracture cuff Lateral x-ray shows excellent alignment AP x-ray shows angulation Impression angulated fracture midshaft humerus In brace 75 year old male with multiple medical problems including coronary artery disease COPD acceptable alignment and brace   EKG: SR rate 80 nonspecific ST changes septal infarct    ASSESSMENT AND PLAN:   1. CAD/CABG:  No angina and non ischemic myovue 2020 Post CABG with ? Stenting to one of his grafts a year ago continue current meds  2. Dyspnea:  Smoker with COPD f/u [place] on oxygen at home  Needs to get  smaller concentrator  3. Urology:  Retention foley out improved with Flomax  4. Dementia:  F/u primary his memory would appear to be very poor  5. Ortho : mechanical fall left humeral fracture 12/04/20 f/u [name] continue sling for weeks more   F/u with cardiology in  A year   Signed: Jenkins Rouge 01/08/2021, 2:42 PM

## 2021-01-04 ENCOUNTER — Other Ambulatory Visit: Payer: Self-pay | Admitting: Internal Medicine

## 2021-01-08 ENCOUNTER — Encounter: Payer: Self-pay | Admitting: Cardiovascular Disease

## 2021-01-08 ENCOUNTER — Other Ambulatory Visit: Payer: Self-pay

## 2021-01-08 ENCOUNTER — Ambulatory Visit: Payer: Medicare Other | Admitting: Cardiovascular Disease

## 2021-01-08 VITALS — BP 142/80 | HR 97 | Ht 66.5 in | Wt 175.0 lb

## 2021-01-08 DIAGNOSIS — I2510 Atherosclerotic heart disease of native coronary artery without angina pectoris: Secondary | ICD-10-CM | POA: Diagnosis not present

## 2021-01-08 DIAGNOSIS — Z951 Presence of aortocoronary bypass graft: Secondary | ICD-10-CM

## 2021-01-08 NOTE — Patient Instructions (Signed)
Medication Instructions:  Your physician recommends that you continue on your current medications as directed. Please refer to the Current Medication list given to you today.  *If you need a refill on your cardiac medications before your next appointment, please call your pharmacy*   Lab Work: NONE  If you have labs (blood work) drawn today and your tests are completely normal, you will receive your results only by: MyChart Message (if you have MyChart) OR A paper copy in the mail If you have any lab test that is abnormal or we need to change your treatment, we will call you to review the results.   Testing/Procedures: NONE    Follow-Up: At [facility], you and your health needs are our priority.  As part of our continuing mission to provide you with exceptional heart care, we have created designated Provider Care Teams.  These Care Teams include your primary Cardiologist (physician) and Advanced Practice Providers (APPs -  Physician Assistants and Nurse Practitioners) who all work together to provide you with the care you need, when you need it.  We recommend signing up for the patient portal called "MyChart".  Sign up information is provided on this After Visit Summary.  MyChart is used to connect with patients for Virtual Visits (Telemedicine).  Patients are able to view lab/test results, encounter notes, upcoming appointments, etc.  Non-urgent messages can be sent to your provider as well.   To learn more about what you can do with MyChart, go to [email or web address].    Your next appointment:   6 month(s)  The format for your next appointment:   In Person  Provider:   Peter Nishan, MD   Other Instructions Thank you for choosing [facility]!    

## 2021-01-15 ENCOUNTER — Telehealth: Payer: Self-pay | Admitting: Internal Medicine

## 2021-01-19 NOTE — Telephone Encounter (Signed)
CM sent to [facility] but the way the order was placed I bet they didn't order POC.

## 2021-01-20 ENCOUNTER — Ambulatory Visit (INDEPENDENT_AMBULATORY_CARE_PROVIDER_SITE_OTHER): Payer: Medicare Other | Admitting: Orthopedic Surgery

## 2021-01-20 ENCOUNTER — Encounter: Payer: Self-pay | Admitting: Orthopedic Surgery

## 2021-01-20 ENCOUNTER — Other Ambulatory Visit: Payer: Self-pay

## 2021-01-20 ENCOUNTER — Ambulatory Visit: Payer: Medicare Other

## 2021-01-20 DIAGNOSIS — S42342D Displaced spiral fracture of shaft of humerus, left arm, subsequent encounter for fracture with routine healing: Secondary | ICD-10-CM | POA: Diagnosis not present

## 2021-01-20 NOTE — Progress Notes (Signed)
Chief Complaint  Patient presents with  . Arm Injury    Fracture 12/04/20 left humerus    7 weeks post injury mid to prox hum shft frx  X-rays in the splint show very good alignment radial nerves intact x-ray again in 5 weeks

## 2021-01-20 NOTE — Telephone Encounter (Signed)
Per Denny Peon with [employer] this is out for delivery now

## 2021-01-20 NOTE — Telephone Encounter (Signed)
Leah with [employer] is checking into this issue

## 2021-01-21 ENCOUNTER — Ambulatory Visit: Payer: Medicare Other | Admitting: Internal Medicine

## 2021-01-21 DIAGNOSIS — J449 Chronic obstructive pulmonary disease, unspecified: Secondary | ICD-10-CM | POA: Diagnosis not present

## 2021-01-22 ENCOUNTER — Ambulatory Visit (INDEPENDENT_AMBULATORY_CARE_PROVIDER_SITE_OTHER): Payer: Medicare Other | Admitting: Internal Medicine

## 2021-01-22 ENCOUNTER — Other Ambulatory Visit: Payer: Self-pay

## 2021-01-22 ENCOUNTER — Encounter: Payer: Self-pay | Admitting: Internal Medicine

## 2021-01-22 DIAGNOSIS — J9612 Chronic respiratory failure with hypercapnia: Secondary | ICD-10-CM

## 2021-01-22 DIAGNOSIS — J449 Chronic obstructive pulmonary disease, unspecified: Secondary | ICD-10-CM

## 2021-01-22 DIAGNOSIS — J9611 Chronic respiratory failure with hypoxia: Secondary | ICD-10-CM

## 2021-01-22 MED ORDER — AZITHROMYCIN 250 MG PO TABS: ORAL_TABLET | ORAL | 0 refills | Status: DC

## 2021-01-22 MED ORDER — PREDNISONE 10 MG PO TABS: ORAL_TABLET | ORAL | 0 refills | Status: DC
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
## 2021-01-22 NOTE — Patient Instructions (Addendum)
I very strongly recommend you get the same ( moderna or pfizer) vaccine as booster soon as possible based on your risk of dying from the virus  and the proven safety and benefit of these vaccines against even the delta and omicron variants.  This can save your life as well as  those of your loved ones,  especially if they are also not vaccinated.   zpak  Prednisone Take 4 for three days 3 for three days 2 for three days 1 for three days and stop   Please schedule a follow up office visit in 6 weeks, call sooner if needed with all medications /inhalers/ solutions in hand so we can verify exactly what you are taking. This includes all medications from all doctors and over the counters   Add:  Needs bnp/bmet /cbc  alpha one phenotype on return

## 2021-01-22 NOTE — Telephone Encounter (Signed)
I tried to confirm with the patient that he finally got his POC but when I tried to call him his mailbox is full and I couldn't leave a message

## 2021-01-23 ENCOUNTER — Encounter: Payer: Self-pay | Admitting: Internal Medicine

## 2021-01-23 NOTE — Assessment & Plan Note (Signed)
Quit smoking 08/2020  - 07/09/2020  After extensive coaching inhaler device,  effectiveness =    75% (short Ti) - 07/09/2020 rec symbicort instead of  breztri due to bladder outlet obst but once foley out successully ok to try breztri - PFT's  08/25/20   FEV1 0.33 (12 % ) ratio 0.30  p 0 % improvement from saba with  FV curve severe concavity  p neb prior to study   Mild flare not better on pred x 6  so repeat 12 d plus zpak    Group D in terms of symptom/risk and laba/lama/ICS  therefore appropriate rx at this point >>>  Preferred symbicort over breztri due to cost so continue this pous prn saba  Re saba: using too often>> I spent extra time with pt today reviewing appropriate use of albuterol for prn use on exertion with the following points: 1) saba is for relief of sob that does not improve by walking a slower pace or resting but rather if the pt does not improve after trying this first. 2) If the pt is convinced, as many are, that saba helps recover from activity faster then it's easy to tell if this is the case by re-challenging : ie stop, take the inhaler, then p 5 minutes try the exact same activity (intensity of workload) that just caused the symptoms and see if they are substantially diminished or not after saba 3) if there is an activity that reproducibly causes the symptoms, try the saba 15 min before the activity on alternate days   If in fact the saba really does help, then fine to continue to use it prn but advised may need to look closer at the maintenance regimen being used to achieve better control of airways disease with exertion.

## 2021-01-23 NOTE — Assessment & Plan Note (Addendum)
HC03   06/08/19  = 37 - ono RA  11/10/20  sats < 89% x 7h 33 min so rec 11/30/2020 = >>  needs 2lpm and repeat on 2lpm> refused 02 concentrator  - 12/08/2020 Patient Saturations on Room Air at Rest = 93% and while Ambulating = 85  on 2 Liters of oxygen while Ambulating = 92%  Still doesn't have 02 but says being delivered today.   Advised re need for covid booster since 6 m out.         Each maintenance medication was reviewed in detail including emphasizing most importantly the difference between maintenance and prns and under what circumstances the prns are to be triggered using an action plan format where appropriate.  Total time for H and P, chart review, counseling, reviewing hfa/02 device(s) and generating customized AVS unique to this office visit / same day charting = 25 min

## 2021-01-27 NOTE — Telephone Encounter (Signed)
I called and spoke with Daniel Barker and he confirmed that he finally got everything that was ordered

## 2021-02-03 ENCOUNTER — Telehealth: Payer: Self-pay | Admitting: Cardiovascular Disease

## 2021-02-03 MED ORDER — PROPAFENONE HCL 225 MG PO TABS: 225.0000 mg | ORAL_TABLET | Freq: Three times a day (TID) | ORAL | 3 refills | Status: DC

## 2021-02-03 NOTE — Telephone Encounter (Signed)
Refill complete 

## 2021-02-03 NOTE — Telephone Encounter (Signed)
New message     *STAT* If patient is at the pharmacy, call can be transferred to refill team.   1. Which medications need to be refilled? (please list name of each medication and dose if known)   propafenone (RYTHMOL) 225 MG tablet  2. Which pharmacy/location (including street and city if local pharmacy) is medication to be sent to? [facility] on [address]   3. Do they need a 30 day or 90 day supply?  [place]

## 2021-02-04 ENCOUNTER — Other Ambulatory Visit: Payer: Self-pay | Admitting: Internal Medicine

## 2021-02-10 ENCOUNTER — Telehealth: Payer: Self-pay | Admitting: Internal Medicine

## 2021-02-10 NOTE — Telephone Encounter (Signed)
Patient calling in with ongoing cough , congestion and drainage.  Seen 01/22/21 , rx zpk and pred taper.  Only got zpack . Did not have pred taper at pharm.  Called pharm. pred taper is there  Advised patient to pick up pred taper and take as directed.  Cont w/ ov recs If not improving call back for sooner follow up   Please contact office for sooner follow up if symptoms do not improve or worsen or seek emergency care   Route to Dr. [name]  For [facility]

## 2021-02-18 DIAGNOSIS — J449 Chronic obstructive pulmonary disease, unspecified: Secondary | ICD-10-CM | POA: Diagnosis not present

## 2021-02-24 ENCOUNTER — Ambulatory Visit: Payer: Medicare Other

## 2021-02-24 ENCOUNTER — Other Ambulatory Visit: Payer: Self-pay

## 2021-02-24 ENCOUNTER — Ambulatory Visit (INDEPENDENT_AMBULATORY_CARE_PROVIDER_SITE_OTHER): Payer: Medicare Other | Admitting: Orthopedic Surgery

## 2021-02-24 DIAGNOSIS — S42342D Displaced spiral fracture of shaft of humerus, left arm, subsequent encounter for fracture with routine healing: Secondary | ICD-10-CM

## 2021-02-24 NOTE — Progress Notes (Signed)
Chief Complaint  Patient presents with  . Follow-up    Recheck on left humerus fracture, DOI 12-04-20.    Encounter Diagnosis  Name Primary?  . Closed displaced spiral fracture of shaft of left humerus with routine healing, subsequent encounter Yes    Injury date January 7  First office visit January 12  Treatment fracture cough  X-rays today show the fracture has good callus around it there is minimal angulation  Exam no tenderness at the fracture site no motion at the fracture site  This is week 11 I would like him to wear the brace about 5 more weeks he can get rid of the sling  Follow-up 5 weeks x-ray

## 2021-03-05 ENCOUNTER — Ambulatory Visit: Payer: Medicare Other | Admitting: Internal Medicine
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
## 2021-03-08 ENCOUNTER — Other Ambulatory Visit: Payer: Self-pay | Admitting: Internal Medicine

## 2021-03-21 DIAGNOSIS — J449 Chronic obstructive pulmonary disease, unspecified: Secondary | ICD-10-CM | POA: Diagnosis not present

## 2021-03-23 DIAGNOSIS — L03116 Cellulitis of left lower limb: Secondary | ICD-10-CM | POA: Diagnosis not present

## 2021-03-23 DIAGNOSIS — L03115 Cellulitis of right lower limb: Secondary | ICD-10-CM | POA: Diagnosis not present

## 2021-03-23 DIAGNOSIS — G250 Essential tremor: Secondary | ICD-10-CM | POA: Diagnosis not present

## 2021-03-31 ENCOUNTER — Other Ambulatory Visit: Payer: Self-pay

## 2021-03-31 ENCOUNTER — Ambulatory Visit: Payer: Medicare Other

## 2021-03-31 ENCOUNTER — Ambulatory Visit (INDEPENDENT_AMBULATORY_CARE_PROVIDER_SITE_OTHER): Payer: Medicare Other | Admitting: Orthopedic Surgery

## 2021-03-31 DIAGNOSIS — S42342D Displaced spiral fracture of shaft of humerus, left arm, subsequent encounter for fracture with routine healing: Secondary | ICD-10-CM

## 2021-03-31 NOTE — Progress Notes (Signed)
Chief Complaint  Patient presents with  . Follow-up    Recheck on left humerus fracture, DOI 12-04-20.   74 on oxygen we opted for nonoperative treatment of a midshaft humerus fracture with fracture brace he has bridging callus on all x-rays and no pain  We are releasing him he can remove his brace follow-up as needed  Access his risk as low risk of additional morbidity mortality course he is at risk of falling

## 2021-03-31 NOTE — Patient Instructions (Signed)
Brace off 

## 2021-04-05 ENCOUNTER — Encounter (HOSPITAL_COMMUNITY): Payer: Self-pay | Admitting: *Deleted

## 2021-04-05 ENCOUNTER — Other Ambulatory Visit: Payer: Self-pay

## 2021-04-05 ENCOUNTER — Emergency Department (HOSPITAL_COMMUNITY): Payer: Medicare Other

## 2021-04-05 ENCOUNTER — Inpatient Hospital Stay (HOSPITAL_COMMUNITY)
Admission: EM | Admit: 2021-04-05 | Discharge: 2021-04-08 | DRG: 190 | Disposition: A | Discharge status: Home / Self Care | Payer: Medicare Other | Attending: Family Medicine | Admitting: Family Medicine

## 2021-04-05 DIAGNOSIS — Z20822 Contact with and (suspected) exposure to covid-19: Secondary | ICD-10-CM | POA: Diagnosis not present

## 2021-04-05 DIAGNOSIS — Z79899 Other long term (current) drug therapy: Secondary | ICD-10-CM | POA: Diagnosis not present

## 2021-04-05 DIAGNOSIS — N400 Enlarged prostate without lower urinary tract symptoms: Secondary | ICD-10-CM | POA: Diagnosis present

## 2021-04-05 DIAGNOSIS — Z7902 Long term (current) use of antithrombotics/antiplatelets: Secondary | ICD-10-CM

## 2021-04-05 DIAGNOSIS — J9621 Acute and chronic respiratory failure with hypoxia: Secondary | ICD-10-CM | POA: Diagnosis present

## 2021-04-05 DIAGNOSIS — I10 Essential (primary) hypertension: Secondary | ICD-10-CM | POA: Diagnosis present

## 2021-04-05 DIAGNOSIS — E785 Hyperlipidemia, unspecified: Secondary | ICD-10-CM | POA: Diagnosis present

## 2021-04-05 DIAGNOSIS — J441 Chronic obstructive pulmonary disease with (acute) exacerbation: Principal | ICD-10-CM | POA: Diagnosis present

## 2021-04-05 DIAGNOSIS — I4891 Unspecified atrial fibrillation: Secondary | ICD-10-CM | POA: Diagnosis not present

## 2021-04-05 DIAGNOSIS — N1831 Chronic kidney disease, stage 3a: Secondary | ICD-10-CM | POA: Diagnosis not present

## 2021-04-05 DIAGNOSIS — I959 Hypotension, unspecified: Secondary | ICD-10-CM | POA: Diagnosis present

## 2021-04-05 DIAGNOSIS — F17210 Nicotine dependence, cigarettes, uncomplicated: Secondary | ICD-10-CM | POA: Diagnosis present

## 2021-04-05 DIAGNOSIS — Z7901 Long term (current) use of anticoagulants: Secondary | ICD-10-CM

## 2021-04-05 DIAGNOSIS — J9622 Acute and chronic respiratory failure with hypercapnia: Secondary | ICD-10-CM | POA: Diagnosis not present

## 2021-04-05 DIAGNOSIS — Z951 Presence of aortocoronary bypass graft: Secondary | ICD-10-CM | POA: Diagnosis not present

## 2021-04-05 DIAGNOSIS — Z825 Family history of asthma and other chronic lower respiratory diseases: Secondary | ICD-10-CM | POA: Diagnosis not present

## 2021-04-05 DIAGNOSIS — I129 Hypertensive chronic kidney disease with stage 1 through stage 4 chronic kidney disease, or unspecified chronic kidney disease: Secondary | ICD-10-CM | POA: Diagnosis present

## 2021-04-05 DIAGNOSIS — R0602 Shortness of breath: Secondary | ICD-10-CM | POA: Diagnosis not present

## 2021-04-05 DIAGNOSIS — J9611 Chronic respiratory failure with hypoxia: Secondary | ICD-10-CM | POA: Diagnosis present

## 2021-04-05 DIAGNOSIS — Z7951 Long term (current) use of inhaled steroids: Secondary | ICD-10-CM | POA: Diagnosis not present

## 2021-04-05 DIAGNOSIS — N200 Calculus of kidney: Secondary | ICD-10-CM | POA: Diagnosis not present

## 2021-04-05 DIAGNOSIS — E7800 Pure hypercholesterolemia, unspecified: Secondary | ICD-10-CM | POA: Diagnosis present

## 2021-04-05 DIAGNOSIS — I4821 Permanent atrial fibrillation: Secondary | ICD-10-CM | POA: Diagnosis not present

## 2021-04-05 DIAGNOSIS — Z955 Presence of coronary angioplasty implant and graft: Secondary | ICD-10-CM

## 2021-04-05 DIAGNOSIS — I517 Cardiomegaly: Secondary | ICD-10-CM | POA: Diagnosis not present

## 2021-04-05 DIAGNOSIS — Z888 Allergy status to other drugs, medicaments and biological substances status: Secondary | ICD-10-CM

## 2021-04-05 DIAGNOSIS — R911 Solitary pulmonary nodule: Secondary | ICD-10-CM | POA: Diagnosis not present

## 2021-04-05 DIAGNOSIS — I2510 Atherosclerotic heart disease of native coronary artery without angina pectoris: Secondary | ICD-10-CM | POA: Diagnosis present

## 2021-04-05 DIAGNOSIS — Z8249 Family history of ischemic heart disease and other diseases of the circulatory system: Secondary | ICD-10-CM | POA: Diagnosis not present

## 2021-04-05 DIAGNOSIS — N138 Other obstructive and reflux uropathy: Secondary | ICD-10-CM | POA: Diagnosis present

## 2021-04-05 DIAGNOSIS — N401 Enlarged prostate with lower urinary tract symptoms: Secondary | ICD-10-CM | POA: Diagnosis present

## 2021-04-05 LAB — COMPREHENSIVE METABOLIC PANEL
ALT: 10 U/L (ref 0–44)
AST: 14 U/L — ABNORMAL LOW (ref 15–41)
Albumin: 3.6 g/dL (ref 3.5–5.0)
Alkaline Phosphatase: 67 U/L (ref 38–126)
Anion gap: 7 (ref 5–15)
BUN: 24 mg/dL — ABNORMAL HIGH (ref 8–23)
CO2: 34 mmol/L — ABNORMAL HIGH (ref 22–32)
Calcium: 9.5 mg/dL (ref 8.9–10.3)
Chloride: 96 mmol/L — ABNORMAL LOW (ref 98–111)
Creatinine, Ser: 1.53 mg/dL — ABNORMAL HIGH (ref 0.61–1.24)
GFR, Estimated: 47 mL/min — ABNORMAL LOW (ref >60)
Glucose, Bld: 111 mg/dL — ABNORMAL HIGH (ref 70–99)
Potassium: 4.3 mmol/L (ref 3.5–5.1)
Sodium: 137 mmol/L (ref 135–145)
Total Bilirubin: 0.6 mg/dL (ref 0.3–1.2)
Total Protein: 7.2 g/dL (ref 6.5–8.1)

## 2021-04-05 LAB — CBC WITH DIFFERENTIAL/PLATELET
Abs Immature Granulocytes: 0.01 10*3/uL (ref 0.00–0.07)
Basophils Absolute: 0.1 10*3/uL (ref 0.0–0.1)
Basophils Relative: 1 %
Eosinophils Absolute: 0.4 10*3/uL (ref 0.0–0.5)
Eosinophils Relative: 5 %
HCT: 40 % (ref 39.0–52.0)
Hemoglobin: 12.5 g/dL — ABNORMAL LOW (ref 13.0–17.0)
Immature Granulocytes: 0 %
Lymphocytes Relative: 17 %
Lymphs Abs: 1.3 10*3/uL (ref 0.7–4.0)
MCH: 31.6 pg (ref 26.0–34.0)
MCHC: 31.3 g/dL (ref 30.0–36.0)
MCV: 101 fL — ABNORMAL HIGH (ref 80.0–100.0)
Monocytes Absolute: 0.7 10*3/uL (ref 0.1–1.0)
Monocytes Relative: 9 %
Neutro Abs: 5.1 10*3/uL (ref 1.7–7.7)
Neutrophils Relative %: 68 %
Platelets: 204 10*3/uL (ref 150–400)
RBC: 3.96 MIL/uL — ABNORMAL LOW (ref 4.22–5.81)
RDW: 14.4 % (ref 11.5–15.5)
WBC: 7.6 10*3/uL (ref 4.0–10.5)
nRBC: 0 % (ref 0.0–0.2)

## 2021-04-05 LAB — RESP PANEL BY RT-PCR (FLU A&B, COVID) ARPGX2
Influenza A by PCR: NEGATIVE
Influenza B by PCR: NEGATIVE
SARS Coronavirus 2 by RT PCR: NEGATIVE

## 2021-04-05 LAB — LIPASE, BLOOD: Lipase: 40 U/L (ref 11–51)

## 2021-04-05 LAB — MAGNESIUM: Magnesium: 2.1 mg/dL (ref 1.7–2.4)

## 2021-04-05 LAB — TROPONIN I (HIGH SENSITIVITY)
Troponin I (High Sensitivity): 9 ng/L (ref <18)
Troponin I (High Sensitivity): 9 ng/L (ref <18)

## 2021-04-05 LAB — D-DIMER, QUANTITATIVE: D-Dimer, Quant: 5.19 ug/mL-FEU — ABNORMAL HIGH (ref 0.00–0.50)

## 2021-04-05 LAB — BRAIN NATRIURETIC PEPTIDE: B Natriuretic Peptide: 190 pg/mL — ABNORMAL HIGH (ref 0.0–100.0)

## 2021-04-05 MED ORDER — PREDNISONE 20 MG PO TABS
40.0000 mg | ORAL_TABLET | Freq: Every day | ORAL | Status: DC
  Administered 2021-04-07 – 2021-04-08 (×2): 40 mg via ORAL
  Filled 2021-04-05 (×2): qty 2

## 2021-04-05 MED ORDER — ALBUTEROL SULFATE (2.5 MG/3ML) 0.083% IN NEBU: 2.5000 mg | INHALATION_SOLUTION | Freq: Once | RESPIRATORY_TRACT | Status: DC

## 2021-04-05 MED ORDER — AZITHROMYCIN 250 MG PO TABS
500.0000 mg | ORAL_TABLET | Freq: Every day | ORAL | Status: DC
  Administered 2021-04-06 – 2021-04-07 (×2): 500 mg via ORAL
  Filled 2021-04-05 (×2): qty 2

## 2021-04-05 MED ORDER — MORPHINE SULFATE (PF) 4 MG/ML IV SOLN
4.0000 mg | Freq: Once | INTRAVENOUS | Status: AC
Start: 2021-04-05 — End: 2021-04-05
  Administered 2021-04-05: 4 mg via INTRAVENOUS
  Filled 2021-04-05: qty 1

## 2021-04-05 MED ORDER — MOMETASONE FURO-FORMOTEROL FUM 200-5 MCG/ACT IN AERO
2.0000 | INHALATION_SPRAY | Freq: Two times a day (BID) | RESPIRATORY_TRACT | Status: DC
  Administered 2021-04-06 – 2021-04-08 (×5): 2 via RESPIRATORY_TRACT
  Filled 2021-04-05: qty 8.8

## 2021-04-05 MED ORDER — AMLODIPINE BESYLATE 5 MG PO TABS
5.0000 mg | ORAL_TABLET | Freq: Every day | ORAL | Status: DC
  Administered 2021-04-06: 5 mg via ORAL
  Filled 2021-04-05: qty 1

## 2021-04-05 MED ORDER — IPRATROPIUM-ALBUTEROL 0.5-2.5 (3) MG/3ML IN SOLN
3.0000 mL | Freq: Four times a day (QID) | RESPIRATORY_TRACT | Status: DC
  Administered 2021-04-06 (×3): 3 mL via RESPIRATORY_TRACT
  Filled 2021-04-05 (×3): qty 3

## 2021-04-05 MED ORDER — MONTELUKAST SODIUM 10 MG PO TABS
10.0000 mg | ORAL_TABLET | Freq: Every day | ORAL | Status: DC
  Administered 2021-04-05 – 2021-04-07 (×3): 10 mg via ORAL
  Filled 2021-04-05 (×3): qty 1

## 2021-04-05 MED ORDER — APIXABAN 2.5 MG PO TABS
2.5000 mg | ORAL_TABLET | Freq: Two times a day (BID) | ORAL | Status: DC
  Administered 2021-04-05 – 2021-04-08 (×6): 2.5 mg via ORAL
  Filled 2021-04-05 (×6): qty 1

## 2021-04-05 MED ORDER — IOHEXOL 350 MG/ML SOLN
80.0000 mL | Freq: Once | INTRAVENOUS | Status: AC | PRN
  Administered 2021-04-05: 80 mL via INTRAVENOUS

## 2021-04-05 MED ORDER — METHYLPREDNISOLONE SODIUM SUCC 40 MG IJ SOLR
40.0000 mg | Freq: Four times a day (QID) | INTRAMUSCULAR | Status: AC
  Administered 2021-04-06 (×4): 40 mg via INTRAVENOUS
  Filled 2021-04-05 (×4): qty 1

## 2021-04-05 MED ORDER — TAMSULOSIN HCL 0.4 MG PO CAPS
0.4000 mg | ORAL_CAPSULE | Freq: Every day | ORAL | Status: DC
  Administered 2021-04-06 – 2021-04-08 (×3): 0.4 mg via ORAL
  Filled 2021-04-05 (×3): qty 1

## 2021-04-05 MED ORDER — TORSEMIDE 20 MG PO TABS
100.0000 mg | ORAL_TABLET | Freq: Every day | ORAL | Status: DC
  Administered 2021-04-06: 100 mg via ORAL
  Filled 2021-04-05 (×2): qty 5

## 2021-04-05 MED ORDER — ALBUTEROL SULFATE HFA 108 (90 BASE) MCG/ACT IN AERS
4.0000 | INHALATION_SPRAY | Freq: Once | RESPIRATORY_TRACT | Status: AC
  Administered 2021-04-05: 4 via RESPIRATORY_TRACT
  Filled 2021-04-05: qty 6.7

## 2021-04-05 MED ORDER — METHYLPREDNISOLONE SODIUM SUCC 125 MG IJ SOLR
125.0000 mg | Freq: Once | INTRAMUSCULAR | Status: AC
  Administered 2021-04-05: 125 mg via INTRAVENOUS
  Filled 2021-04-05: qty 2

## 2021-04-05 MED ORDER — CLOPIDOGREL BISULFATE 75 MG PO TABS
75.0000 mg | ORAL_TABLET | Freq: Every day | ORAL | Status: DC
  Administered 2021-04-06 – 2021-04-08 (×3): 75 mg via ORAL
  Filled 2021-04-05 (×3): qty 1

## 2021-04-05 MED ORDER — AEROCHAMBER PLUS FLO-VU MISC
1.0000 | Freq: Once | Status: AC
  Administered 2021-04-05: 1
  Filled 2021-04-05: qty 1

## 2021-04-05 MED ORDER — SODIUM CHLORIDE 0.9 % IV SOLN
500.0000 mg | INTRAVENOUS | Status: AC
  Administered 2021-04-05: 500 mg via INTRAVENOUS
  Filled 2021-04-05: qty 500

## 2021-04-05 NOTE — ED Triage Notes (Signed)
Shortness of breath with pain under right lower rib, states breathing got worse after pain started. Patient is oxygen dependant

## 2021-04-05 NOTE — Progress Notes (Signed)
Assisted patient with Incentive Spirometer, patient was able to do 546m with 10X.  Patient gave a good effort, left IS at bedside.

## 2021-04-05 NOTE — ED Notes (Signed)
Pt anxious, co sob and air hunger, speaks in 2-3 word sentences max. Provider notified, orders received.

## 2021-04-05 NOTE — ED Provider Notes (Signed)
[facility] EMERGENCY DEPARTMENT Provider Note   CSN: [id number] Arrival date & time: 04/05/21  1818     History Chief Complaint  Patient presents with  . Shortness of Breath    Daniel Barker is a 75 y.o. male with a past medical history significant for COPD on chronic 2 L nasal cannula, hyperlipidemia, hypertension, CAD, CKD, A. fib on chronic Eliquis who presents to the ED due to worsening shortness of breath associated with pain below his right rib that began early this morning. Pain is worse with deep inspiration. Denies history of DVT. He has been compliant with Eliquis with no missed doses. Denies central chest pain. Admits to worsening bilateral lower extremity edema over the past few weeks.  Denies sick contacts or known COVID exposures.  He is currently vaccinated against COVID-19. No treatment prior to arrival.  History obtained from patient and past medical records. No interpreter used during encounter.      Past Medical History:  Diagnosis Date  . Asthma   . Atrial fibrillation ([place])   . BPH (benign prostatic hyperplasia)   . Cigarette smoker   . CKD (chronic kidney disease)   . COPD (chronic obstructive pulmonary disease) ([place])   . Coronary artery disease   . Cough   . High cholesterol   . Hx of CABG   . Hypercholesteremia   . Hypertension   . Localized edema   . Stented coronary artery     Patient Active Problem List   Diagnosis Date Noted  . Acute exacerbation of chronic obstructive pulmonary disease (COPD) ([place]) 04/05/2021  . Closed displaced spiral fracture of shaft of left humerus 12/23/2020  . Chronic respiratory failure with hypoxia and hypercapnia ([place]) 09/02/2020  . Urinary retention 07/08/2020  . Benign prostatic hyperplasia with urinary obstruction 07/08/2020  . COPD GOLD IV / group D  05/28/2020  . Cigarette smoker 05/28/2020  . AKI (acute kidney injury) ([place]) 06/03/2014  . Anemia 06/03/2014  . CAD (coronary atherosclerotic disease) 06/03/2014   . Acute respiratory failure with hypercapnia ([place]) 06/01/2014  . Diabetes mellitus ([place]) 06/01/2014  . Hypertension 06/01/2014    Past Surgical History:  Procedure Laterality Date  . CARDIAC SURGERY    . CORONARY ARTERY BYPASS GRAFT         Family History  Problem Relation Age of Onset  . Hypertension Mother   . Clotting disorder Mother   . COPD Father     Social History   Tobacco Use  . Smoking status: Former Smoker    Packs/day: 1.00    Years: 60.00    Pack years: 60.00    Types: Cigarettes    Quit date: 09/01/2020    Years since quitting: 0.5  . Smokeless tobacco: Never Used  Vaping Use  . Vaping Use: Never used  Substance Use Topics  . Alcohol use: Never  . Drug use: Never    Home Medications Prior to Admission medications   Medication Sig Start Date End Date Taking? Authorizing Provider  acetaminophen (TYLENOL) 500 MG tablet Take 1,000 mg by mouth every 6 (six) hours as needed.   Yes [provider]  albuterol (PROAIR HFA) 108 (90 Base) MCG/ACT inhaler 2 puffs every 4 hours as needed only  if your can't catch your breath 10/27/20  Yes Tanda Rockers, MD  albuterol (PROVENTIL) (2.5 MG/3ML) 0.083% nebulizer solution INHALE THE CONTENTS OF 1 VIAL VIA NEBULIZER EVERY 6 HOURS AS NEEDED FOR WHEEZING OR SHORTNESS OF BREATH Patient taking differently: Take  2.5 mg by nebulization every 6 (six) hours as needed for wheezing or shortness of breath. 03/09/21  Yes Tanda Rockers, MD  amLODipine (NORVASC) 5 MG tablet Take 5 mg by mouth daily.   Yes [provider]  budesonide-formoterol (SYMBICORT) 160-4.5 MCG/ACT inhaler Take 2 puffs first thing in am and then another 2 puffs about 12 hours later. 10/27/20  Yes Tanda Rockers, MD  clopidogrel (PLAVIX) 75 MG tablet Take 75 mg by mouth daily.   Yes [provider]  fenofibrate micronized (LOFIBRA) 134 MG capsule Take 134 mg by mouth daily before breakfast.   Yes [provider]  montelukast  (SINGULAIR) 10 MG tablet Take 10 mg by mouth at bedtime.   Yes [provider]  propafenone (RYTHMOL) 225 MG tablet Take 1 tablet (225 mg total) by mouth every 8 (eight) hours. 02/03/21  Yes Josue Hector, MD  tamsulosin (FLOMAX) 0.4 MG CAPS capsule Take 1 capsule (0.4 mg total) by mouth daily. 07/08/20  Yes McKenzie, Candee Furbish, MD  torsemide (DEMADEX) 100 MG tablet Take 100 mg by mouth daily.   Yes [provider]  apixaban (ELIQUIS) 2.5 MG TABS tablet Take 2.5 mg by mouth 2 (two) times daily.    [provider]  azithromycin (ZITHROMAX) 250 MG tablet Take 2 on day one then 1 daily x 4 days Patient not taking: Reported on 04/05/2021 01/22/21   Tanda Rockers, MD  doxycycline (VIBRA-TABS) 100 MG tablet Take 100 mg by mouth 2 (two) times daily. Patient not taking: Reported on 04/05/2021 03/23/21   [provider]  predniSONE (DELTASONE) 10 MG tablet Take 4 for three days 3 for three days 2 for three days 1 for three days and stop Patient not taking: Reported on 04/05/2021 01/22/21   Tanda Rockers, MD    Allergies    Lipitor [atorvastatin]  Review of Systems   Review of Systems  Constitutional: Negative for chills and fever.  Respiratory: Positive for shortness of breath.   Cardiovascular: Positive for chest pain and leg swelling.  Gastrointestinal: Positive for abdominal pain. Negative for diarrhea, nausea and vomiting.  All other systems reviewed and are negative.   Physical Exam Updated Vital Signs BP 135/85   Pulse 89   Temp 98.4 F (36.9 C) (Oral)   Resp 20   SpO2 93%   Physical Exam Vitals and nursing note reviewed.  Constitutional:      General: He is not in acute distress.    Appearance: He is not ill-appearing.  HENT:     Head: Normocephalic.  Eyes:     Pupils: Pupils are equal, round, and reactive to light.  Cardiovascular:     Rate and Rhythm: Normal rate and regular rhythm.     Pulses: Normal pulses.     Heart sounds: Normal heart  sounds. No murmur heard. No friction rub. No gallop.   Pulmonary:     Effort: Pulmonary effort is normal.     Breath sounds: Wheezing and rales present.  Abdominal:     General: Abdomen is flat. There is no distension.     Palpations: Abdomen is soft.     Tenderness: There is abdominal tenderness. There is no guarding or rebound.     Comments: RUQ tenderness  Musculoskeletal:        General: Normal range of motion.     Cervical back: Neck supple.     Comments: 2+ pitting edema bilaterally.   Skin:    General:  Skin is warm and dry.  Neurological:     General: No focal deficit present.     Mental Status: He is alert.  Psychiatric:        Mood and Affect: Mood normal.        Behavior: Behavior normal.     ED Results / Procedures / Treatments   Labs (all labs ordered are listed, but only abnormal results are displayed) Labs Reviewed  CBC WITH DIFFERENTIAL/PLATELET - Abnormal; Notable for the following components:      Result Value   RBC 3.96 (*)    Hemoglobin 12.5 (*)    MCV 101.0 (*)    All other components within normal limits  COMPREHENSIVE METABOLIC PANEL - Abnormal; Notable for the following components:   Chloride 96 (*)    CO2 34 (*)    Glucose, Bld 111 (*)    BUN 24 (*)    Creatinine, Ser 1.53 (*)    AST 14 (*)    GFR, Estimated 47 (*)    All other components within normal limits  BRAIN NATRIURETIC PEPTIDE - Abnormal; Notable for the following components:   B Natriuretic Peptide 190.0 (*)    All other components within normal limits  D-DIMER, QUANTITATIVE - Abnormal; Notable for the following components:   D-Dimer, Quant 5.19 (*)    All other components within normal limits  RESP PANEL BY RT-PCR (FLU A&B, COVID) ARPGX2  LIPASE, BLOOD  TROPONIN I (HIGH SENSITIVITY)  TROPONIN I (HIGH SENSITIVITY)    EKG None  Radiology CT Angio Chest PE W and/or Wo Contrast  Result Date: 04/05/2021 CLINICAL DATA:  Worsening shortness of breath and abdominal distension,  initial encounter EXAM: CT ANGIOGRAPHY CHEST CT ABDOMEN AND PELVIS WITH CONTRAST TECHNIQUE: Multidetector CT imaging of the chest was performed using the standard protocol during bolus administration of intravenous contrast. Multiplanar CT image reconstructions and MIPs were obtained to evaluate the vascular anatomy. Multidetector CT imaging of the abdomen and pelvis was performed using the standard protocol during bolus administration of intravenous contrast. CONTRAST:  70m OMNIPAQUE IOHEXOL 350 MG/ML SOLN COMPARISON:  Chest x-ray from earlier in the same day, 05/15/2020. FINDINGS: CTA CHEST FINDINGS Cardiovascular: Atherosclerotic calcifications of the thoracic aorta are noted. No aneurysmal dilatation or dissection is seen. Changes of prior coronary bypass grafting are noted. Heavy coronary calcifications are seen. Pulmonary artery shows a normal branching pattern bilaterally. No filling defect is identified to suggest pulmonary embolism. Mediastinum/Nodes: Thoracic inlet is within normal limits. No sizable hilar or mediastinal adenopathy is noted. The esophagus as visualized is within normal limits. Lungs/Pleura: Lungs are well aerated bilaterally. Diffuse emphysematous changes are seen. 5 mm nodule is noted in the posterior aspect of the right upper lobe best seen on image number 54 of series 8. Small pleural effusion is noted on the right. No focal infiltrate is seen. Musculoskeletal: Compression deformity at T8 is identified this is stable in appearance from prior exam of 2021 degenerative changes are seen. Findings of prior median sternotomy are noted. No rib abnormality is seen. Review of the MIP images confirms the above findings. CT ABDOMEN and PELVIS FINDINGS Hepatobiliary: No focal liver abnormality is seen. No gallstones, gallbladder wall thickening, or biliary dilatation. Pancreas: Unremarkable. No pancreatic ductal dilatation or surrounding inflammatory changes. Spleen: Normal in size without focal  abnormality. Adrenals/Urinary Tract: Adrenal glands are within normal limits. Tiny nonobstructing stone is noted in the upper pole of the left kidney best seen on image number 24 series 2. Diffuse  renal vascular calcifications are noted bilaterally. No obstructive changes are seen. Bladder is within normal limits. Stomach/Bowel: The appendix is within normal limits. No obstructive or inflammatory changes of the colon are seen. Small bowel and stomach appear within normal limits. Vascular/Lymphatic: Aortic atherosclerosis. No enlarged abdominal or pelvic lymph nodes. Reproductive: Prostate is unremarkable. Other: No abdominal wall hernia or abnormality. No abdominopelvic ascites. Musculoskeletal: No acute or significant osseous findings. 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 Review of the MIP images confirms the above findings. IMPRESSION: CTA of the chest: No evidence of pulmonary emboli. 5 mm nodule in the posterior aspect of the right upper lobe. No follow-up needed if patient is low-risk. Non-contrast chest CT can be considered in 12 months if patient is high-risk. This recommendation follows the consensus statement: Guidelines for Management of Incidental Pulmonary Nodules Detected on CT Images: From the Fleischner Society 2017; Radiology 2017; 284:228-243. Chronic T8 compression deformity. CT of the abdomen and pelvis: Tiny nonobstructing left renal stone in the upper pole. Chronic changes at L5 with anterolisthesis. Electronically Signed   By: Inez Catalina M.D.   On: 04/05/2021 20:43   CT ABDOMEN PELVIS W CONTRAST  Result Date: 04/05/2021 CLINICAL DATA:  Worsening shortness of breath and abdominal distension, initial encounter EXAM: CT ANGIOGRAPHY CHEST CT ABDOMEN AND PELVIS WITH CONTRAST TECHNIQUE: Multidetector CT imaging of the chest was performed using the standard protocol during bolus administration of intravenous contrast. Multiplanar CT image reconstructions and  MIPs were obtained to evaluate the vascular anatomy. Multidetector CT imaging of the abdomen and pelvis was performed using the standard protocol during bolus administration of intravenous contrast. CONTRAST:  35m OMNIPAQUE IOHEXOL 350 MG/ML SOLN COMPARISON:  Chest x-ray from earlier in the same day, 05/15/2020. FINDINGS: CTA CHEST FINDINGS Cardiovascular: Atherosclerotic calcifications of the thoracic aorta are noted. No aneurysmal dilatation or dissection is seen. Changes of prior coronary bypass grafting are noted. Heavy coronary calcifications are seen. Pulmonary artery shows a normal branching pattern bilaterally. No filling defect is identified to suggest pulmonary embolism. Mediastinum/Nodes: Thoracic inlet is within normal limits. No sizable hilar or mediastinal adenopathy is noted. The esophagus as visualized is within normal limits. Lungs/Pleura: Lungs are well aerated bilaterally. Diffuse emphysematous changes are seen. 5 mm nodule is noted in the posterior aspect of the right upper lobe best seen on image number 54 of series 8. Small pleural effusion is noted on the right. No focal infiltrate is seen. Musculoskeletal: Compression deformity at T8 is identified this is stable in appearance from prior exam of 2021 degenerative changes are seen. Findings of prior median sternotomy are noted. No rib abnormality is seen. Review of the MIP images confirms the above findings. CT ABDOMEN and PELVIS FINDINGS Hepatobiliary: No focal liver abnormality is seen. No gallstones, gallbladder wall thickening, or biliary dilatation. Pancreas: Unremarkable. No pancreatic ductal dilatation or surrounding inflammatory changes. Spleen: Normal in size without focal abnormality. Adrenals/Urinary Tract: Adrenal glands are within normal limits. Tiny nonobstructing stone is noted in the upper pole of the left kidney best seen on image number 24 series 2. Diffuse renal vascular calcifications are noted bilaterally. No obstructive  changes are seen. Bladder is within normal limits. Stomach/Bowel: The appendix is within normal limits. No obstructive or inflammatory changes of the colon are seen. Small bowel and stomach appear within normal limits. Vascular/Lymphatic: Aortic atherosclerosis. No enlarged abdominal or pelvic lymph nodes. Reproductive: Prostate is unremarkable. Other: No abdominal wall hernia or abnormality. No abdominopelvic ascites. Musculoskeletal: No acute  or significant osseous findings. 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 Review of the MIP images confirms the above findings. IMPRESSION: CTA of the chest: No evidence of pulmonary emboli. 5 mm nodule in the posterior aspect of the right upper lobe. No follow-up needed if patient is low-risk. Non-contrast chest CT can be considered in 12 months if patient is high-risk. This recommendation follows the consensus statement: Guidelines for Management of Incidental Pulmonary Nodules Detected on CT Images: From the Fleischner Society 2017; Radiology 2017; 284:228-243. Chronic T8 compression deformity. CT of the abdomen and pelvis: Tiny nonobstructing left renal stone in the upper pole. Chronic changes at L5 with anterolisthesis. Electronically Signed   By: Inez Catalina M.D.   On: 04/05/2021 20:43   DG Chest Portable 1 View  Result Date: 04/05/2021 CLINICAL DATA:  Shortness of breath.  Pain. EXAM: PORTABLE CHEST 1 VIEW COMPARISON:  Most recent radiograph 05/15/2020 FINDINGS: Post median sternotomy and CABG. Borderline cardiomegaly. Unchanged mediastinal contours with aortic atherosclerosis. Chronic hyperinflation. Increased peribronchial thickening from prior exam. Linear opacity in the right mid lung may represent fluid in the fissure or atelectasis. No significant sub pulmonic effusion. No pneumothorax. Known left humeral shaft fracture is only partially included in the field of view. IMPRESSION: 1. Increased peribronchial thickening from prior  exam, may be pulmonary edema or acute bronchitis superimposed on COPD. 2. Linear opacity in the right mid lung may represent fluid in the fissure or atelectasis. Electronically Signed   By: Keith Rake M.D.   On: 04/05/2021 19:28    Procedures Procedures   Medications Ordered in ED Medications  albuterol (PROVENTIL) (2.5 MG/3ML) 0.083% nebulizer solution 2.5 mg (has no administration in time range)  albuterol (VENTOLIN HFA) 108 (90 Base) MCG/ACT inhaler 4 puff (4 puffs Inhalation Given 04/05/21 1931)  aerochamber plus with mask device 1 each (1 each Other Given 04/05/21 1931)  morphine 4 MG/ML injection 4 mg (4 mg Intravenous Given 04/05/21 1940)  methylPREDNISolone sodium succinate (SOLU-MEDROL) 125 mg/2 mL injection 125 mg (125 mg Intravenous Given 04/05/21 1940)  iohexol (OMNIPAQUE) 350 MG/ML injection 80 mL (80 mLs Intravenous Contrast Given 04/05/21 2026)    ED Course  I have reviewed the triage vital signs and the nursing notes.  Pertinent labs & imaging results that were available during my care of the patient were reviewed by me and considered in my medical decision making (see chart for details).  Clinical Course as of 04/05/21 2206  Mon Apr 05, 2021  1851 D-Dimer, Quant(!): 5.19 [CA]  1951 B Natriuretic Peptide(!): 190.0 [CA]    Clinical Course User Index [CA] Karie Kirks   MDM Rules/Calculators/A&P                         75 year old male presents to the ED due to right upper quadrant abdominal pain/ right sided low chest pain that has been present for the past few hours. Pain worse with deep inspiration.  Patient has a history of COPD.  He is chronically on Eliquis for A. fib which he has been compliant with.  Upon arrival patient afebrile, not tachycardic. Patient hypoxic on baseline 2L [place], so increased to 4L. Given location of pain, abdominal labs and CXR ordered. DDX: PE, gallbladder etiology, lower lobe pneumonia, COPD exacerbation, etc. No rash to suggest  shingles Solumedrol, albuterol, and morphine given. Discussed case with Dr. Vanita Panda who evaluated patient at bedside and agrees with assessment and plan.  CXR personally reviewed which demonstrates: IMPRESSION:  1. Increased peribronchial thickening from prior exam, may be  pulmonary edema or acute bronchitis superimposed on COPD.  2. Linear opacity in the right mid lung may represent fluid in the  fissure or atelectasis.   D-dimer elevated at 5.19.  CTA chest and CT abdomen ordered to rule out PE and acute abdomen given location of pain.  BNP mildly elevated at 190.  Troponin normal at 9.  Lipase normal at 40.  Doubt pancreatitis.  COVID/influenza negative.  CMP significant for elevated creatinine at 1.53 and BUN at 24 likely due to CKD however, have no baseline labs to compare.  AST mildly elevated at 14.  CBC reassuring with no leukocytosis.  Mild anemia with hemoglobin at 12.5. CTA chest and abdomen personally reviewed which demonstrates: IMPRESSION:  CTA of the chest: No evidence of pulmonary emboli.    5 mm nodule in the posterior aspect of the right upper lobe. No  follow-up needed if patient is low-risk. Non-contrast chest CT can  be considered in 12 months if patient is high-risk. This  recommendation follows the consensus statement: Guidelines for  Management of Incidental Pulmonary Nodules Detected on CT Images:  From the Fleischner Society 2017; Radiology 2017; 284:228-243.    Chronic T8 compression deformity.    CT of the abdomen and pelvis: Tiny nonobstructing left renal stone  in the upper pole.    Chronic changes at L5 with anterolisthesis.   Given patient's worsening shortness of breath with increased oxygen requirement, patient will require admission for COPD exacerbation.   Discussed case with Dr. Lorene Dy with [facility] who agrees to admit patient for further treatment.  Final Clinical Impression(s) / ED Diagnoses Final diagnoses:  COPD exacerbation [facility])    Rx /  DC Orders ED Discharge Orders    None       Karie Kirks 04/05/21 2207    Carmin Muskrat, MD 04/08/21 2138

## 2021-04-05 NOTE — H&P (Signed)
History and Physical    Daniel Barker [id number] DOB: March 23, 1946 DOA: 04/05/2021  PCP: [name], MD  Patient coming from: home  I have personally briefly reviewed patient's old medical records in [place]  Chief Complaint: right sided chest pain  HPI: Daniel Barker is a 75 y.o. male with medical history significant of asthma, COPD, chronic hypoxic respiratory failure on 2 L of oxygen,, chronic atrial fibrillation, BPH, currently Cortril dependence, CKD stage IIIa, CAD and multiple other comorbidities was brought into the emergency department with the complaint of right-sided chest pain.  Daughter at the bedside.  According with patient and verified by the daughter, patient started having right anterior chest pain at around 1 in the morning.  The pain has been constant since then and sharp with no radiation and no relieving factor but aggravates with deep breathing.  Patient also has been having worsening shortness of breath ever since.  He denies any cough, fever, chills, nausea, vomiting, any abdominal pain, any problem with urination or with bowel movements.  No sick contact.  He continues to smoke half a pack a day.  ED Course: Upon arrival to ED, patient was diffusely wheezy but not tachypneic.  Blood pressure and heart rate were normal.  He was requiring 4 L of oxygen to keep his oxygen saturation over 90%.  D-dimer was elevated so follow-up CT angiogram of the chest was done which ruled out PE.  CT abdomen and pelvis showed tiny nonobstructive left renal stone but no other acute pathology to explain his pain.  BMP showed CKD stage IIIa.  Lipase normal.  COVID-negative.  Review of Systems: As per HPI otherwise negative.    Past Medical History:  Diagnosis Date  . Asthma   . Atrial fibrillation ([place])   . BPH (benign prostatic hyperplasia)   . Cigarette smoker   . CKD (chronic kidney disease)   . COPD (chronic obstructive pulmonary disease) ([place])   . Coronary artery  disease   . Cough   . High cholesterol   . Hx of CABG   . Hypercholesteremia   . Hypertension   . Localized edema   . Stented coronary artery     Past Surgical History:  Procedure Laterality Date  . CARDIAC SURGERY    . CORONARY ARTERY BYPASS GRAFT       reports that he quit smoking about 7 months ago. His smoking use included cigarettes. He has a 60.00 pack-year smoking history. He has never used smokeless tobacco. He reports that he does not drink alcohol and does not use drugs.  Allergies  Allergen Reactions  . Lipitor [Atorvastatin]     Family History  Problem Relation Age of Onset  . Hypertension Mother   . Clotting disorder Mother   . COPD Father     Prior to Admission medications   Medication Sig Start Date End Date Taking? Authorizing Provider  acetaminophen (TYLENOL) 500 MG tablet Take 1,000 mg by mouth every 6 (six) hours as needed.   Yes [provider]  albuterol (PROAIR HFA) 108 (90 Base) MCG/ACT inhaler 2 puffs every 4 hours as needed only  if your can't catch your breath 10/27/20  Yes Tanda Rockers, MD  albuterol (PROVENTIL) (2.5 MG/3ML) 0.083% nebulizer solution INHALE THE CONTENTS OF 1 VIAL VIA NEBULIZER EVERY 6 HOURS AS NEEDED FOR WHEEZING OR SHORTNESS OF BREATH Patient taking differently: Take 2.5 mg by nebulization every 6 (six) hours as needed for wheezing or shortness of breath. 03/09/21  Yes Tanda Rockers, MD  amLODipine (NORVASC) 5 MG tablet Take 5 mg by mouth daily.   Yes [provider]  budesonide-formoterol (SYMBICORT) 160-4.5 MCG/ACT inhaler Take 2 puffs first thing in am and then another 2 puffs about 12 hours later. 10/27/20  Yes Tanda Rockers, MD  clopidogrel (PLAVIX) 75 MG tablet Take 75 mg by mouth daily.   Yes [provider]  fenofibrate micronized (LOFIBRA) 134 MG capsule Take 134 mg by mouth daily before breakfast.   Yes [provider]  montelukast (SINGULAIR) 10 MG tablet Take 10 mg by mouth at  bedtime.   Yes [provider]  propafenone (RYTHMOL) 225 MG tablet Take 1 tablet (225 mg total) by mouth every 8 (eight) hours. 02/03/21  Yes Josue Hector, MD  tamsulosin (FLOMAX) 0.4 MG CAPS capsule Take 1 capsule (0.4 mg total) by mouth daily. 07/08/20  Yes McKenzie, Candee Furbish, MD  torsemide (DEMADEX) 100 MG tablet Take 100 mg by mouth daily.   Yes [provider]  apixaban (ELIQUIS) 2.5 MG TABS tablet Take 2.5 mg by mouth 2 (two) times daily.    [provider]  azithromycin (ZITHROMAX) 250 MG tablet Take 2 on day one then 1 daily x 4 days Patient not taking: Reported on 04/05/2021 01/22/21   Tanda Rockers, MD  doxycycline (VIBRA-TABS) 100 MG tablet Take 100 mg by mouth 2 (two) times daily. Patient not taking: Reported on 04/05/2021 03/23/21   [provider]  predniSONE (DELTASONE) 10 MG tablet Take 4 for three days 3 for three days 2 for three days 1 for three days and stop Patient not taking: Reported on 04/05/2021 01/22/21   Tanda Rockers, MD    Physical Exam: Vitals:   04/05/21 2000 04/05/21 2030 04/05/21 2100 04/05/21 2130  BP: 122/64 132/66 125/65 135/85  Pulse: 80 85 78 89  Resp: '19 19 18 20  '$ Temp:      TempSrc:      SpO2: 98% 97% 97% 93%    Constitutional: NAD, calm, comfortable Vitals:   04/05/21 2000 04/05/21 2030 04/05/21 2100 04/05/21 2130  BP: 122/64 132/66 125/65 135/85  Pulse: 80 85 78 89  Resp: '19 19 18 20  '$ Temp:      TempSrc:      SpO2: 98% 97% 97% 93%   Eyes: PERRL, lids and conjunctivae normal ENMT: Mucous membranes are moist. Posterior pharynx clear of any exudate or lesions.Normal dentition.  Neck: normal, supple, no masses, no thyromegaly Respiratory: Diffuse expiratory wheezes with diminished breath sounds bilaterally,, no crackles. Normal respiratory effort. No accessory muscle use.  Cardiovascular: Irregularly irregular rate and rhythm, no murmurs / rubs / gallops. No extremity edema. 2+ pedal pulses. No carotid  bruits.  Abdomen: no tenderness, no masses palpated. No hepatosplenomegaly. Bowel sounds positive.  Musculoskeletal: no clubbing / cyanosis. No joint deformity upper and lower extremities. Good ROM, no contractures. Normal muscle tone.  Skin: no rashes, lesions, ulcers. No induration Neurologic: CN 2-12 grossly intact. Sensation intact, DTR normal. Strength 5/5 in all 4.  Psychiatric: Normal judgment and insight. Alert and oriented x 3. Normal mood.    Labs on Admission: I have personally reviewed following labs and imaging studies  CBC: Recent Labs  Lab 04/05/21 1846  WBC 7.6  NEUTROABS 5.1  HGB 12.5*  HCT 40.0  MCV 101.0*  PLT 0000000   Basic Metabolic Panel: Recent Labs  Lab 04/05/21 1846  NA 137  K 4.3  CL 96*  CO2 34*  GLUCOSE 111*  BUN 24*  CREATININE 1.53*  CALCIUM 9.5   GFR: CrCl cannot be calculated (Unknown ideal weight.). Liver Function Tests: Recent Labs  Lab 04/05/21 1846  AST 14*  ALT 10  ALKPHOS 67  BILITOT 0.6  PROT 7.2  ALBUMIN 3.6   Recent Labs  Lab 04/05/21 1846  LIPASE 40   No results for input(s): AMMONIA in the last 168 hours. Coagulation Profile: No results for input(s): INR, PROTIME in the last 168 hours. Cardiac Enzymes: No results for input(s): CKTOTAL, CKMB, CKMBINDEX, TROPONINI in the last 168 hours. BNP (last 3 results) No results for input(s): PROBNP in the last 8760 hours. HbA1C: No results for input(s): HGBA1C in the last 72 hours. CBG: No results for input(s): GLUCAP in the last 168 hours. Lipid Profile: No results for input(s): CHOL, HDL, LDLCALC, TRIG, CHOLHDL, LDLDIRECT in the last 72 hours. Thyroid Function Tests: No results for input(s): TSH, T4TOTAL, FREET4, T3FREE, THYROIDAB in the last 72 hours. Anemia Panel: No results for input(s): VITAMINB12, FOLATE, FERRITIN, TIBC, IRON, RETICCTPCT in the last 72 hours. Urine analysis:    Component Value Date/Time   COLORURINE YELLOW 03/28/2020 1601   APPEARANCEUR HAZY  (A) 03/28/2020 1601   LABSPEC 1.009 03/28/2020 1601   PHURINE 7.0 03/28/2020 1601   GLUCOSEU NEGATIVE 03/28/2020 1601   HGBUR MODERATE (A) 03/28/2020 1601   BILIRUBINUR NEGATIVE 03/28/2020 1601   KETONESUR NEGATIVE 03/28/2020 1601   PROTEINUR NEGATIVE 03/28/2020 1601   NITRITE NEGATIVE 03/28/2020 1601   LEUKOCYTESUR MODERATE (A) 03/28/2020 1601    Radiological Exams on Admission: CT Angio Chest PE W and/or Wo Contrast  Result Date: 04/05/2021 CLINICAL DATA:  Worsening shortness of breath and abdominal distension, initial encounter EXAM: CT ANGIOGRAPHY CHEST CT ABDOMEN AND PELVIS WITH CONTRAST TECHNIQUE: Multidetector CT imaging of the chest was performed using the standard protocol during bolus administration of intravenous contrast. Multiplanar CT image reconstructions and MIPs were obtained to evaluate the vascular anatomy. Multidetector CT imaging of the abdomen and pelvis was performed using the standard protocol during bolus administration of intravenous contrast. CONTRAST:  2m OMNIPAQUE IOHEXOL 350 MG/ML SOLN COMPARISON:  Chest x-ray from earlier in the same day, 05/15/2020. FINDINGS: CTA CHEST FINDINGS Cardiovascular: Atherosclerotic calcifications of the thoracic aorta are noted. No aneurysmal dilatation or dissection is seen. Changes of prior coronary bypass grafting are noted. Heavy coronary calcifications are seen. Pulmonary artery shows a normal branching pattern bilaterally. No filling defect is identified to suggest pulmonary embolism. Mediastinum/Nodes: Thoracic inlet is within normal limits. No sizable hilar or mediastinal adenopathy is noted. The esophagus as visualized is within normal limits. Lungs/Pleura: Lungs are well aerated bilaterally. Diffuse emphysematous changes are seen. 5 mm nodule is noted in the posterior aspect of the right upper lobe best seen on image number 54 of series 8. Small pleural effusion is noted on the right. No focal infiltrate is seen. Musculoskeletal:  Compression deformity at T8 is identified this is stable in appearance from prior exam of 2021 degenerative changes are seen. Findings of prior median sternotomy are noted. No rib abnormality is seen. Review of the MIP images confirms the above findings. CT ABDOMEN and PELVIS FINDINGS Hepatobiliary: No focal liver abnormality is seen. No gallstones, gallbladder wall thickening, or biliary dilatation. Pancreas: Unremarkable. No pancreatic ductal dilatation or surrounding inflammatory changes. Spleen: Normal in size without focal abnormality. Adrenals/Urinary Tract: Adrenal glands are within normal limits. Tiny nonobstructing stone is noted in the upper pole of the left kidney  best seen on image number 24 series 2. Diffuse renal vascular calcifications are noted bilaterally. No obstructive changes are seen. Bladder is within normal limits. Stomach/Bowel: The appendix is within normal limits. No obstructive or inflammatory changes of the colon are seen. Small bowel and stomach appear within normal limits. Vascular/Lymphatic: Aortic atherosclerosis. No enlarged abdominal or pelvic lymph nodes. Reproductive: Prostate is unremarkable. Other: No abdominal wall hernia or abnormality. No abdominopelvic ascites. Musculoskeletal: No acute or significant osseous findings. 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 Review of the MIP images confirms the above findings. IMPRESSION: CTA of the chest: No evidence of pulmonary emboli. 5 mm nodule in the posterior aspect of the right upper lobe. No follow-up needed if patient is low-risk. Non-contrast chest CT can be considered in 12 months if patient is high-risk. This recommendation follows the consensus statement: Guidelines for Management of Incidental Pulmonary Nodules Detected on CT Images: From the Fleischner Society 2017; Radiology 2017; 284:228-243. Chronic T8 compression deformity. CT of the abdomen and pelvis: Tiny nonobstructing left renal  stone in the upper pole. Chronic changes at L5 with anterolisthesis. Electronically Signed   By: Inez Catalina M.D.   On: 04/05/2021 20:43   CT ABDOMEN PELVIS W CONTRAST  Result Date: 04/05/2021 CLINICAL DATA:  Worsening shortness of breath and abdominal distension, initial encounter EXAM: CT ANGIOGRAPHY CHEST CT ABDOMEN AND PELVIS WITH CONTRAST TECHNIQUE: Multidetector CT imaging of the chest was performed using the standard protocol during bolus administration of intravenous contrast. Multiplanar CT image reconstructions and MIPs were obtained to evaluate the vascular anatomy. Multidetector CT imaging of the abdomen and pelvis was performed using the standard protocol during bolus administration of intravenous contrast. CONTRAST:  27m OMNIPAQUE IOHEXOL 350 MG/ML SOLN COMPARISON:  Chest x-ray from earlier in the same day, 05/15/2020. FINDINGS: CTA CHEST FINDINGS Cardiovascular: Atherosclerotic calcifications of the thoracic aorta are noted. No aneurysmal dilatation or dissection is seen. Changes of prior coronary bypass grafting are noted. Heavy coronary calcifications are seen. Pulmonary artery shows a normal branching pattern bilaterally. No filling defect is identified to suggest pulmonary embolism. Mediastinum/Nodes: Thoracic inlet is within normal limits. No sizable hilar or mediastinal adenopathy is noted. The esophagus as visualized is within normal limits. Lungs/Pleura: Lungs are well aerated bilaterally. Diffuse emphysematous changes are seen. 5 mm nodule is noted in the posterior aspect of the right upper lobe best seen on image number 54 of series 8. Small pleural effusion is noted on the right. No focal infiltrate is seen. Musculoskeletal: Compression deformity at T8 is identified this is stable in appearance from prior exam of 2021 degenerative changes are seen. Findings of prior median sternotomy are noted. No rib abnormality is seen. Review of the MIP images confirms the above findings. CT ABDOMEN  and PELVIS FINDINGS Hepatobiliary: No focal liver abnormality is seen. No gallstones, gallbladder wall thickening, or biliary dilatation. Pancreas: Unremarkable. No pancreatic ductal dilatation or surrounding inflammatory changes. Spleen: Normal in size without focal abnormality. Adrenals/Urinary Tract: Adrenal glands are within normal limits. Tiny nonobstructing stone is noted in the upper pole of the left kidney best seen on image number 24 series 2. Diffuse renal vascular calcifications are noted bilaterally. No obstructive changes are seen. Bladder is within normal limits. Stomach/Bowel: The appendix is within normal limits. No obstructive or inflammatory changes of the colon are seen. Small bowel and stomach appear within normal limits. Vascular/Lymphatic: Aortic atherosclerosis. No enlarged abdominal or pelvic lymph nodes. Reproductive: Prostate is unremarkable. Other: No abdominal wall  hernia or abnormality. No abdominopelvic ascites. Musculoskeletal: No acute or significant osseous findings. 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 Review of the MIP images confirms the above findings. IMPRESSION: CTA of the chest: No evidence of pulmonary emboli. 5 mm nodule in the posterior aspect of the right upper lobe. No follow-up needed if patient is low-risk. Non-contrast chest CT can be considered in 12 months if patient is high-risk. This recommendation follows the consensus statement: Guidelines for Management of Incidental Pulmonary Nodules Detected on CT Images: From the Fleischner Society 2017; Radiology 2017; 284:228-243. Chronic T8 compression deformity. CT of the abdomen and pelvis: Tiny nonobstructing left renal stone in the upper pole. Chronic changes at L5 with anterolisthesis. Electronically Signed   By: Inez Catalina M.D.   On: 04/05/2021 20:43   DG Chest Portable 1 View  Result Date: 04/05/2021 CLINICAL DATA:  Shortness of breath.  Pain. EXAM: PORTABLE CHEST 1 VIEW  COMPARISON:  Most recent radiograph 05/15/2020 FINDINGS: Post median sternotomy and CABG. Borderline cardiomegaly. Unchanged mediastinal contours with aortic atherosclerosis. Chronic hyperinflation. Increased peribronchial thickening from prior exam. Linear opacity in the right mid lung may represent fluid in the fissure or atelectasis. No significant sub pulmonic effusion. No pneumothorax. Known left humeral shaft fracture is only partially included in the field of view. IMPRESSION: 1. Increased peribronchial thickening from prior exam, may be pulmonary edema or acute bronchitis superimposed on COPD. 2. Linear opacity in the right mid lung may represent fluid in the fissure or atelectasis. Electronically Signed   By: Keith Rake M.D.   On: 04/05/2021 19:28    EKG: Independently reviewed.  Atrial fibrillation  Assessment/Plan Active Problems:   Acute exacerbation of chronic obstructive pulmonary disease (COPD) ([facility])    Acute on chronic hypoxic respiratory failure secondary to acute COPD exacerbation: Patient received bronchodilators And Solu-Medrol in the ED. still requiring 4 L oxygen.  Still wheezy.  We will continue IV Solu-Medrol as well as bronchodilators and azithromycin.  Incentive spirometry.  Permanent atrial fibrillation: Rate controlled.  Resume home medications including Eliquis.  BPH: Resume Flomax.  Hypertension: Resume amlodipine.  CAD: Resume Plavix.  DVT prophylaxis: apixaban (ELIQUIS) tablet 2.5 mg Start: 04/05/21 2215 Code Status: Full code Family Communication: Daughter present at bedside.  Plan of care discussed with patient in length and he verbalized understanding and agreed with it. Disposition Plan: Will require 2 midnight hospitalizations Consults called: None Admission status: Inpatient   Status is: Inpatient  Remains inpatient appropriate because:Inpatient level of care appropriate due to severity of illness   Dispo: The patient is from: Home               Anticipated d/c is to: Home              Patient currently is not medically stable to d/c.   Difficult to place patient No       Darliss Cheney MD [facility]  04/05/2021, 10:07 PM  To contact the attending provider between 7A-7P or the covering provider during after hours 7P-7A, please log into the web site [email or web address]

## 2021-04-06 DIAGNOSIS — J441 Chronic obstructive pulmonary disease with (acute) exacerbation: Principal | ICD-10-CM

## 2021-04-06 LAB — CBC WITH DIFFERENTIAL/PLATELET
Abs Immature Granulocytes: 0.02 10*3/uL (ref 0.00–0.07)
Basophils Absolute: 0 10*3/uL (ref 0.0–0.1)
Basophils Relative: 0 %
Eosinophils Absolute: 0 10*3/uL (ref 0.0–0.5)
Eosinophils Relative: 0 %
HCT: 39.3 % (ref 39.0–52.0)
Hemoglobin: 12.2 g/dL — ABNORMAL LOW (ref 13.0–17.0)
Immature Granulocytes: 0 %
Lymphocytes Relative: 9 %
Lymphs Abs: 0.4 10*3/uL — ABNORMAL LOW (ref 0.7–4.0)
MCH: 31.6 pg (ref 26.0–34.0)
MCHC: 31 g/dL (ref 30.0–36.0)
MCV: 101.8 fL — ABNORMAL HIGH (ref 80.0–100.0)
Monocytes Absolute: 0 10*3/uL — ABNORMAL LOW (ref 0.1–1.0)
Monocytes Relative: 0 %
Neutro Abs: 4.1 10*3/uL (ref 1.7–7.7)
Neutrophils Relative %: 91 %
Platelets: 174 10*3/uL (ref 150–400)
RBC: 3.86 MIL/uL — ABNORMAL LOW (ref 4.22–5.81)
RDW: 14.3 % (ref 11.5–15.5)
WBC: 4.6 10*3/uL (ref 4.0–10.5)
nRBC: 0 % (ref 0.0–0.2)

## 2021-04-06 LAB — BASIC METABOLIC PANEL
Anion gap: 6 (ref 5–15)
BUN: 21 mg/dL (ref 8–23)
CO2: 34 mmol/L — ABNORMAL HIGH (ref 22–32)
Calcium: 9.1 mg/dL (ref 8.9–10.3)
Chloride: 99 mmol/L (ref 98–111)
Creatinine, Ser: 1.33 mg/dL — ABNORMAL HIGH (ref 0.61–1.24)
GFR, Estimated: 56 mL/min — ABNORMAL LOW (ref >60)
Glucose, Bld: 165 mg/dL — ABNORMAL HIGH (ref 70–99)
Potassium: 5.1 mmol/L (ref 3.5–5.1)
Sodium: 139 mmol/L (ref 135–145)

## 2021-04-06 LAB — HIV ANTIBODY (ROUTINE TESTING W REFLEX): HIV Screen 4th Generation wRfx: NONREACTIVE

## 2021-04-06 MED ORDER — AMLODIPINE BESYLATE 5 MG PO TABS
2.5000 mg | ORAL_TABLET | Freq: Every day | ORAL | Status: DC
  Administered 2021-04-07: 2.5 mg via ORAL
  Filled 2021-04-06: qty 1

## 2021-04-06 MED ORDER — ENSURE ENLIVE PO LIQD
237.0000 mL | Freq: Two times a day (BID) | ORAL | Status: DC
  Administered 2021-04-06 – 2021-04-08 (×4): 237 mL via ORAL

## 2021-04-06 MED ORDER — IPRATROPIUM-ALBUTEROL 0.5-2.5 (3) MG/3ML IN SOLN
3.0000 mL | Freq: Four times a day (QID) | RESPIRATORY_TRACT | Status: DC
  Administered 2021-04-07: 3 mL via RESPIRATORY_TRACT
  Filled 2021-04-06: qty 3

## 2021-04-06 MED ORDER — ACETAMINOPHEN 325 MG PO TABS
650.0000 mg | ORAL_TABLET | Freq: Four times a day (QID) | ORAL | Status: DC | PRN
  Administered 2021-04-06: 650 mg via ORAL
  Filled 2021-04-06: qty 2

## 2021-04-06 MED ORDER — LIDOCAINE 5 % EX PTCH
1.0000 | MEDICATED_PATCH | CUTANEOUS | Status: DC
  Administered 2021-04-06 – 2021-04-08 (×2): 1 via TRANSDERMAL
  Filled 2021-04-06 (×2): qty 1

## 2021-04-06 NOTE — Evaluation (Signed)
Physical Therapy Evaluation Patient Details Name: Daniel Barker MRN: [id number] DOB: 1946/09/22 Today's Date: 04/06/2021   History of Present Illness  Daniel Barker is a 75 y.o. male with c/o R sided chest pain. PE ruled out, CT abdomen and pelvic showed L renal stone. PMH: asthma, COPD, chronic hypoxic respiratory failure on 2 L of oxygen, afib, BPH, CKD, CAD, HTN, CABG  Clinical Impression  Pt admitted with above diagnosis. Pt ind at baseline, using supplemental O2 PRN. Pt currently desats on 3L O2 to 86% requiring seated rest break and pursed lip breathing to recover. Pt ambulates 126f without AD and supv, no LOB, limited due to SOB and R side abdominal pain. Pt educated on OOB activity and verbalized agreement. Pt currently with functional limitations due to the deficits listed below (see PT Problem List). Pt will benefit from skilled PT to increase their independence and safety with mobility to allow discharge to the venue listed below.       Follow Up Recommendations No PT follow up    Equipment Recommendations  None recommended by PT    Recommendations for Other Services       Precautions / Restrictions Precautions Precautions: Other (comment) Precaution Comments: monitor O2 Restrictions Weight Bearing Restrictions: No      Mobility  Bed Mobility Overal bed mobility: Modified Independent  General bed mobility comments: slightly increased time    Transfers Overall transfer level: Needs assistance Equipment used: None Transfers: Sit to/from Stand Sit to Stand: Supervision  General transfer comment: powers to stand with BUE assisting, good steadiness upon rising  Ambulation/Gait Ambulation/Gait assistance: Supervision Gait Distance (Feet): 100 Feet Assistive device: None Gait Pattern/deviations: Step-through pattern;Decreased stride length Gait velocity: slightly decreased   General Gait Details: slightly decreased cadence, ambulates laps in room due to unable  to tolerate wearing mask in hallway, moderate SOB with R side abdominal pain with ambulation limiting tolerance, no LOB without AD  Stairs            Wheelchair Mobility    Modified Rankin (Stroke Patients Only)       Balance Overall balance assessment: No apparent balance deficits (not formally assessed)        Pertinent Vitals/Pain Pain Assessment: Faces Faces Pain Scale: Hurts a little bit Pain Location: R abdomen Pain Descriptors / Indicators: Aching Pain Intervention(s): Limited activity within patient's tolerance;Monitored during session    Home Living Family/patient expects to be discharged to:: Private residence Living Arrangements: Spouse/significant other;Children (daughter and granddaughter) Available Help at Discharge: Family;Available PRN/intermittently Type of Home: Mobile home Home Access: Stairs to enter Entrance Stairs-Rails: RPsychiatric nurseof Steps: 4-5 Home Layout: One level   Additional Comments: Pt's spouse has a RW and BSC, but she is currently using them with HHPT, pt's spouse recently d/c home from SNF after hip replacement    Prior Function Level of Independence: Independent         Comments: Pt ind with community ambulation, ADLs/IADLs, drives, denies falls, using O2 PRN.     Hand Dominance   Dominant Hand: Left    Extremity/Trunk Assessment   Upper Extremity Assessment Upper Extremity Assessment: Defer to OT evaluation    Lower Extremity Assessment Lower Extremity Assessment: Overall WFL for tasks assessed    Cervical / Trunk Assessment Cervical / Trunk Assessment: Normal  Communication   Communication: HOH  Cognition Arousal/Alertness: Awake/alert Behavior During Therapy: WFL for tasks assessed/performed Overall Cognitive Status: Within Functional Limits for tasks assessed  General Comments: Pt very HOH      General Comments General comments (skin  integrity, edema, etc.): Pt on 3L O2, SpO2 86% with ambulation, recovers to 94% with seated pursed lip breathing and rest    Exercises     Assessment/Plan    PT Assessment Patient needs continued PT services  PT Problem List Decreased activity tolerance;Cardiopulmonary status limiting activity;Pain       PT Treatment Interventions DME instruction;Gait training;Stair training;Functional mobility training;Therapeutic activities;Therapeutic exercise;Balance training;Patient/family education    PT Goals (Current goals can be found in the Care Plan section)  Acute Rehab PT Goals Patient Stated Goal: return home PT Goal Formulation: With patient Time For Goal Achievement: 04/20/21 Potential to Achieve Goals: Good    Frequency Min 3X/week   Barriers to discharge        Co-evaluation               AM-PAC PT "6 Clicks" Mobility  Outcome Measure Help needed turning from your back to your side while in a flat bed without using bedrails?: None Help needed moving from lying on your back to sitting on the side of a flat bed without using bedrails?: None Help needed moving to and from a bed to a chair (including a wheelchair)?: A Little Help needed standing up from a chair using your arms (e.g., wheelchair or bedside chair)?: A Little Help needed to walk in hospital room?: A Little Help needed climbing 3-5 steps with a railing? : A Little 6 Click Score: 20    End of Session Equipment Utilized During Treatment: Oxygen Activity Tolerance: Patient tolerated treatment well Patient left: in chair;with call bell/phone within reach;Other (comment) (dietitian in room) Nurse Communication: Mobility status;Other (comment) (longer O2 tubing) PT Visit Diagnosis: Other abnormalities of gait and mobility (R26.89)    Time: [id number] PT Time Calculation (min) (ACUTE ONLY): 35 min   Charges:   PT Evaluation $PT Eval Low Complexity: 1 Low PT Treatments $Gait Training: 8-22 mins          Tori Chesnie Capell PT, DPT 04/06/21, 12:47 PM

## 2021-04-06 NOTE — Progress Notes (Signed)
Patient Demographics:    Daniel Barker, is a 75 y.o. male, DOB - Sep 27, 1946, [id number]  Admit date - 04/05/2021   Admitting Physician Darliss Cheney, MD  Outpatient Primary MD for the patient is [name], MD  LOS - 1   Chief Complaint  Patient presents with  . Shortness of Breath        Subjective:    Daniel Barker today has no fevers, no emesis,  No chest pain,   -- Cough and shortness of breath persist -Currently requiring 3 L of oxygen via nasal cannula continuously, significant dyspnea on exertion  Assessment  & Plan :    Principal Problem:   Acute exacerbation of chronic obstructive pulmonary disease (COPD) (HCC) Active Problems:   Chronic respiratory failure with hypoxia and hypercapnia (HCC)   CAD (coronary atherosclerotic disease)   Cigarette smoker   Benign prostatic hyperplasia with urinary obstruction   Hypertension  Brief Summary:- 75 y.o. male with medical history significant of asthma, COPD, chronic hypoxic respiratory failure on 2 L of oxygen,, chronic atrial fibrillation, BPH, currently Cortril dependence, CKD stage IIIa, CAD admitted on 04/05/2021 with acute COPD exacerbation, acute on chronic worsening hypoxic respiratory failure ,patient admits to ongoing tobacco use  A/p 1) acute COPD exacerbation--remains quite symptomatic with cough congestion wheezing and dyspnea on exertion and worsening hypoxia -Continue IV Solu-Medrol and transition to prednisone with improvement, continue bronchodilators and mucolytics -Continue azithromycin -Patient is not interested in smoking cessation  2) chronic atrial fibrillation--continue apixaban for stroke prophylaxis, -Awaiting pharmacy to verify propafenone (RYTHMOL)  3)HTN--BP soft, so  decrease amlodipine to 2.5 mg daily  4) acute on chronic hypoxic respiratory failure secondary to #1 above -Treat as above #1 continue  supplemental oxygen currently on 3 L/min 5)CAD  6)BPH--continue Flomax 0.4 mg  Other--apparently patient is taking torsemide 100 mg daily----we will for pharmacy to verify dosage  Disposition/Need for in-Hospital Stay- patient unable to be discharged at this time due to --worsening hypoxia and COPD exacerbation requiring IV steroids and supplemental oxygen*  Status is: Inpatient  Remains inpatient appropriate because:Please see disposition above   Disposition: The patient is from: Home              Anticipated d/c is to: Home              Anticipated d/c date is: 2 days              Patient currently is not medically stable to d/c. Barriers: Not Clinically Stable-   Code Status : -  Code Status: Full Code   Family Communication:    NA (patient is alert, awake and coherent)   Consults  :  na  DVT Prophylaxis  :   - SCDs   apixaban (ELIQUIS) tablet 2.5 mg Start: 04/05/21 2245 apixaban (ELIQUIS) tablet 2.5 mg    Lab Results  Component Value Date   PLT 174 04/06/2021    Inpatient Medications  Scheduled Meds: . amLODipine  5 mg Oral Daily  . apixaban  2.5 mg Oral BID  . azithromycin  500 mg Oral QHS  . clopidogrel  75 mg Oral Daily  . feeding supplement  237 mL Oral BID BM  . ipratropium-albuterol  3 mL Nebulization  Q6H  . methylPREDNISolone (SOLU-MEDROL) injection  40 mg Intravenous Q6H   Followed by  . [START ON 04/07/2021] predniSONE  40 mg Oral Q breakfast  . mometasone-formoterol  2 puff Inhalation BID  . montelukast  10 mg Oral QHS  . tamsulosin  0.4 mg Oral Daily  . torsemide  100 mg Oral Daily   Continuous Infusions: PRN Meds:.    Anti-infectives (From admission, onward)   Start     Dose/Rate Route Frequency Ordered Stop   04/06/21 2200  azithromycin (ZITHROMAX) tablet 500 mg       "Followed by" Linked Group Details   500 mg Oral Daily at bedtime 04/05/21 2206 04/10/21 2159   04/05/21 2300  azithromycin (ZITHROMAX) 500 mg in sodium chloride 0.9 % 250 mL  IVPB       "Followed by" Linked Group Details   500 mg 250 mL/hr over 60 Minutes Intravenous Every 24 hours 04/05/21 2206 04/06/21 0100        Objective:   Vitals:   04/06/21 0440 04/06/21 0743 04/06/21 1319 04/06/21 1409  BP: 131/90   (!) 107/55  Pulse: 77   87  Resp: 20   18  Temp: 97.8 F (36.6 C)   97.8 F (36.6 C)  TempSrc:    Oral  SpO2: 98% 97% 98% 98%    Wt Readings from Last 3 Encounters:  01/22/21 80.6 kg  01/08/21 79.4 kg  12/09/20 80.5 kg     Intake/Output Summary (Last 24 hours) at 04/06/2021 1727 Last data filed at 04/06/2021 1014 Gross per 24 hour  Intake --  Output 550 ml  Net -550 ml     Physical Exam  Gen:- Awake Alert, dyspnea on exertion HEENT:- [place].AT, No sclera icterus Nose- [place] 3L/min Neck-Supple Neck,No JVD,.  Lungs-diminished breath sounds, scattered wheezes  CV- S1, S2 normal, irregular  Abd-  +ve B.Sounds, Abd Soft, No tenderness,    Extremity/Skin:- No  edema, pedal pulses present  Psych-affect is appropriate, oriented x3 Neuro-no new focal deficits, no tremors   Data Review:   Micro Results Recent Results (from the past 240 hour(s))  Resp Panel by RT-PCR (Flu A&B, Covid) Nasopharyngeal Swab     Status: None   Collection Time: 04/05/21  6:52 PM   Specimen: Nasopharyngeal Swab; Nasopharyngeal(NP) swabs in vial transport medium  Result Value Ref Range Status   SARS Coronavirus 2 by RT PCR NEGATIVE NEGATIVE Final    Comment: (NOTE) SARS-CoV-2 target nucleic acids are NOT DETECTED.  The SARS-CoV-2 RNA is generally detectable in upper respiratory specimens during the acute phase of infection. The lowest concentration of SARS-CoV-2 viral copies this assay can detect is 138 copies/mL. A negative result does not preclude SARS-Cov-2 infection and should not be used as the sole basis for treatment or other patient management decisions. A negative result may occur with  improper specimen collection/handling, submission of specimen  other than nasopharyngeal swab, presence of viral mutation(s) within the areas targeted by this assay, and inadequate number of viral copies(<138 copies/mL). A negative result must be combined with clinical observations, patient history, and epidemiological information. The expected result is Negative.  Fact Sheet for Patients:  EntrepreneurPulse.com.au  Fact Sheet for Healthcare Providers:  IncredibleEmployment.be  This test is no t yet approved or cleared by the [place] FDA and  has been authorized for detection and/or diagnosis of SARS-CoV-2 by FDA under an Emergency Use Authorization (EUA). This EUA will remain  in effect (meaning this test can be used) for  the duration of the COVID-19 declaration under Section 564(b)(1) of the Act, 21 U.S.C.section 360bbb-3(b)(1), unless the authorization is terminated  or revoked sooner.       Influenza A by PCR NEGATIVE NEGATIVE Final   Influenza B by PCR NEGATIVE NEGATIVE Final    Comment: (NOTE) The Xpert Xpress SARS-CoV-2/FLU/RSV plus assay is intended as an aid in the diagnosis of influenza from Nasopharyngeal swab specimens and should not be used as a sole basis for treatment. Nasal washings and aspirates are unacceptable for Xpert Xpress SARS-CoV-2/FLU/RSV testing.  Fact Sheet for Patients: EntrepreneurPulse.com.au  Fact Sheet for Healthcare Providers: IncredibleEmployment.be  This test is not yet approved or cleared by the [place] FDA and has been authorized for detection and/or diagnosis of SARS-CoV-2 by FDA under an Emergency Use Authorization (EUA). This EUA will remain in effect (meaning this test can be used) for the duration of the COVID-19 declaration under Section 564(b)(1) of the Act, 21 U.S.C. section 360bbb-3(b)(1), unless the authorization is terminated or revoked.  Performed at [facility], [address], [place], [place]  [address]     Radiology Reports CT Angio Chest PE W and/or Wo Contrast  Result Date: 04/05/2021 CLINICAL DATA:  Worsening shortness of breath and abdominal distension, initial encounter EXAM: CT ANGIOGRAPHY CHEST CT ABDOMEN AND PELVIS WITH CONTRAST TECHNIQUE: Multidetector CT imaging of the chest was performed using the standard protocol during bolus administration of intravenous contrast. Multiplanar CT image reconstructions and MIPs were obtained to evaluate the vascular anatomy. Multidetector CT imaging of the abdomen and pelvis was performed using the standard protocol during bolus administration of intravenous contrast. CONTRAST:  62m OMNIPAQUE IOHEXOL 350 MG/ML SOLN COMPARISON:  Chest x-ray from earlier in the same day, 05/15/2020. FINDINGS: CTA CHEST FINDINGS Cardiovascular: Atherosclerotic calcifications of the thoracic aorta are noted. No aneurysmal dilatation or dissection is seen. Changes of prior coronary bypass grafting are noted. Heavy coronary calcifications are seen. Pulmonary artery shows a normal branching pattern bilaterally. No filling defect is identified to suggest pulmonary embolism. Mediastinum/Nodes: Thoracic inlet is within normal limits. No sizable hilar or mediastinal adenopathy is noted. The esophagus as visualized is within normal limits. Lungs/Pleura: Lungs are well aerated bilaterally. Diffuse emphysematous changes are seen. 5 mm nodule is noted in the posterior aspect of the right upper lobe best seen on image number 54 of series 8. Small pleural effusion is noted on the right. No focal infiltrate is seen. Musculoskeletal: Compression deformity at T8 is identified this is stable in appearance from prior exam of 2021 degenerative changes are seen. Findings of prior median sternotomy are noted. No rib abnormality is seen. Review of the MIP images confirms the above findings. CT ABDOMEN and PELVIS FINDINGS Hepatobiliary: No focal liver abnormality is seen. No gallstones, gallbladder  wall thickening, or biliary dilatation. Pancreas: Unremarkable. No pancreatic ductal dilatation or surrounding inflammatory changes. Spleen: Normal in size without focal abnormality. Adrenals/Urinary Tract: Adrenal glands are within normal limits. Tiny nonobstructing stone is noted in the upper pole of the left kidney best seen on image number 24 series 2. Diffuse renal vascular calcifications are noted bilaterally. No obstructive changes are seen. Bladder is within normal limits. Stomach/Bowel: The appendix is within normal limits. No obstructive or inflammatory changes of the colon are seen. Small bowel and stomach appear within normal limits. Vascular/Lymphatic: Aortic atherosclerosis. No enlarged abdominal or pelvic lymph nodes. Reproductive: Prostate is unremarkable. Other: No abdominal wall hernia or abnormality. No abdominopelvic ascites. Musculoskeletal: No acute or significant osseous findings.  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 Review of the MIP images confirms the above findings. IMPRESSION: CTA of the chest: No evidence of pulmonary emboli. 5 mm nodule in the posterior aspect of the right upper lobe. No follow-up needed if patient is low-risk. Non-contrast chest CT can be considered in 12 months if patient is high-risk. This recommendation follows the consensus statement: Guidelines for Management of Incidental Pulmonary Nodules Detected on CT Images: From the Fleischner Society 2017; Radiology 2017; 284:228-243. Chronic T8 compression deformity. CT of the abdomen and pelvis: Tiny nonobstructing left renal stone in the upper pole. Chronic changes at L5 with anterolisthesis. Electronically Signed   By: Inez Catalina M.D.   On: 04/05/2021 20:43   CT ABDOMEN PELVIS W CONTRAST  Result Date: 04/05/2021 CLINICAL DATA:  Worsening shortness of breath and abdominal distension, initial encounter EXAM: CT ANGIOGRAPHY CHEST CT ABDOMEN AND PELVIS WITH CONTRAST TECHNIQUE:  Multidetector CT imaging of the chest was performed using the standard protocol during bolus administration of intravenous contrast. Multiplanar CT image reconstructions and MIPs were obtained to evaluate the vascular anatomy. Multidetector CT imaging of the abdomen and pelvis was performed using the standard protocol during bolus administration of intravenous contrast. CONTRAST:  83m OMNIPAQUE IOHEXOL 350 MG/ML SOLN COMPARISON:  Chest x-ray from earlier in the same day, 05/15/2020. FINDINGS: CTA CHEST FINDINGS Cardiovascular: Atherosclerotic calcifications of the thoracic aorta are noted. No aneurysmal dilatation or dissection is seen. Changes of prior coronary bypass grafting are noted. Heavy coronary calcifications are seen. Pulmonary artery shows a normal branching pattern bilaterally. No filling defect is identified to suggest pulmonary embolism. Mediastinum/Nodes: Thoracic inlet is within normal limits. No sizable hilar or mediastinal adenopathy is noted. The esophagus as visualized is within normal limits. Lungs/Pleura: Lungs are well aerated bilaterally. Diffuse emphysematous changes are seen. 5 mm nodule is noted in the posterior aspect of the right upper lobe best seen on image number 54 of series 8. Small pleural effusion is noted on the right. No focal infiltrate is seen. Musculoskeletal: Compression deformity at T8 is identified this is stable in appearance from prior exam of 2021 degenerative changes are seen. Findings of prior median sternotomy are noted. No rib abnormality is seen. Review of the MIP images confirms the above findings. CT ABDOMEN and PELVIS FINDINGS Hepatobiliary: No focal liver abnormality is seen. No gallstones, gallbladder wall thickening, or biliary dilatation. Pancreas: Unremarkable. No pancreatic ductal dilatation or surrounding inflammatory changes. Spleen: Normal in size without focal abnormality. Adrenals/Urinary Tract: Adrenal glands are within normal limits. Tiny  nonobstructing stone is noted in the upper pole of the left kidney best seen on image number 24 series 2. Diffuse renal vascular calcifications are noted bilaterally. No obstructive changes are seen. Bladder is within normal limits. Stomach/Bowel: The appendix is within normal limits. No obstructive or inflammatory changes of the colon are seen. Small bowel and stomach appear within normal limits. Vascular/Lymphatic: Aortic atherosclerosis. No enlarged abdominal or pelvic lymph nodes. Reproductive: Prostate is unremarkable. Other: No abdominal wall hernia or abnormality. No abdominopelvic ascites. Musculoskeletal: No acute or significant osseous findings. 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 Review of the MIP images confirms the above findings. IMPRESSION: CTA of the chest: No evidence of pulmonary emboli. 5 mm nodule in the posterior aspect of the right upper lobe. No follow-up needed if patient is low-risk. Non-contrast chest CT can be considered in 12 months if patient is high-risk. This recommendation follows the consensus statement: Guidelines  for Management of Incidental Pulmonary Nodules Detected on CT Images: From the Fleischner Society 2017; Radiology 2017; [phone]. Chronic T8 compression deformity. CT of the abdomen and pelvis: Tiny nonobstructing left renal stone in the upper pole. Chronic changes at L5 with anterolisthesis. Electronically Signed   By: Inez Catalina M.D.   On: 04/05/2021 20:43   DG Chest Portable 1 View  Result Date: 04/05/2021 CLINICAL DATA:  Shortness of breath.  Pain. EXAM: PORTABLE CHEST 1 VIEW COMPARISON:  Most recent radiograph 05/15/2020 FINDINGS: Post median sternotomy and CABG. Borderline cardiomegaly. Unchanged mediastinal contours with aortic atherosclerosis. Chronic hyperinflation. Increased peribronchial thickening from prior exam. Linear opacity in the right mid lung may represent fluid in the fissure or atelectasis. No significant  sub pulmonic effusion. No pneumothorax. Known left humeral shaft fracture is only partially included in the field of view. IMPRESSION: 1. Increased peribronchial thickening from prior exam, may be pulmonary edema or acute bronchitis superimposed on COPD. 2. Linear opacity in the right mid lung may represent fluid in the fissure or atelectasis. Electronically Signed   By: Keith Rake M.D.   On: 04/05/2021 19:28   DG Humerus Left  Result Date: 03/31/2021 [facility] Radiology Report Dictated by Dr. [name] Chief complaint humerus fracture on January 7 Images: AP lateral left humerus Slightly above midshaft fracture left humerus which appears to have more angulation than it did last time However we do see good fracture callus around the fracture on all films indicating good progression towards healing     CBC Recent Labs  Lab 04/05/21 1846 04/06/21 0654  WBC 7.6 4.6  HGB 12.5* 12.2*  HCT 40.0 39.3  PLT 204 174  MCV 101.0* 101.8*  MCH 31.6 31.6  MCHC 31.3 31.0  RDW 14.4 14.3  LYMPHSABS 1.3 0.4*  MONOABS 0.7 0.0*  EOSABS 0.4 0.0  BASOSABS 0.1 0.0    Chemistries  Recent Labs  Lab 04/05/21 1846 04/06/21 0654  NA 137 139  K 4.3 5.1  CL 96* 99  CO2 34* 34*  GLUCOSE 111* 165*  BUN 24* 21  CREATININE 1.53* 1.33*  CALCIUM 9.5 9.1  MG 2.1  --   AST 14*  --   ALT 10  --   ALKPHOS 67  --   BILITOT 0.6  --    ------------------------------------------------------------------------------------------------------------------ No results for input(s): CHOL, HDL, LDLCALC, TRIG, CHOLHDL, LDLDIRECT in the last 72 hours.  No results found for: HGBA1C ------------------------------------------------------------------------------------------------------------------ No results for input(s): TSH, T4TOTAL, T3FREE, THYROIDAB in the last 72 hours.  Invalid input(s):  FREET3 ------------------------------------------------------------------------------------------------------------------ No results for input(s): VITAMINB12, FOLATE, FERRITIN, TIBC, IRON, RETICCTPCT in the last 72 hours.  Coagulation profile No results for input(s): INR, PROTIME in the last 168 hours.  Recent Labs    04/05/21 1846  DDIMER 5.19*    Cardiac Enzymes No results for input(s): CKMB, TROPONINI, MYOGLOBIN in the last 168 hours.  Invalid input(s): CK ------------------------------------------------------------------------------------------------------------------    Component Value Date/Time   BNP 190.0 (H) 04/05/2021 1847     Roxan Hockey M.D on 04/06/2021 at 5:27 PM  Go to [email or web address] - for contact info  [facility] - Office  [phone]

## 2021-04-06 NOTE — Plan of Care (Signed)
  Problem: Acute Rehab PT Goals(only PT should resolve) Goal: Patient Will Transfer Sit To/From Stand Outcome: Progressing Flowsheets (Taken 04/06/2021 1252) Patient will transfer sit to/from stand: with modified independence Goal: Pt Will Ambulate Outcome: Progressing Flowsheets (Taken 04/06/2021 1252) Pt will Ambulate:  > 125 feet  with modified independence   Tori Alta Goding PT, DPT 04/06/21, 12:52 PM

## 2021-04-06 NOTE — Progress Notes (Signed)
Initial Nutrition Assessment  DOCUMENTATION CODES:      INTERVENTION:  Heart Healthy diet   Ensure Enlive po daily, each supplement provides 350 kcal and 20 grams of protein   Snack between meals as desired from nursing nourishment room   NUTRITION DIAGNOSIS:   Inadequate oral intake related to social / environmental circumstances as evidenced by per patient/family report (routinely eats 2 meals daily).   GOAL:  Patient will meet greater than or equal to 90% of their needs  MONITOR:  PO intake,Supplement acceptance,Weight trends,Labs  REASON FOR ASSESSMENT:   Consult Assessment of nutrition requirement/status  ASSESSMENT: Patient is a 75 yo male who presents from home with shortness of breath, right-side chest pain and increased BLE. Hx of COPD, CKD, CAD, HTN, HLD and DM.   Patient reports good appetite. Baseline he eats  2 meals daily: sandwich for lunch with coke or water and his daughter or granddaughter cooks dinner for their family.   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   Patient weight range this year 77-81 kg. Current 80.6 kg. No significant change in weight.   Medications: Demadex, flomax.   Labs: BMP Latest Ref Rng & Units 04/06/2021 04/05/2021  Glucose 70 - 99 mg/dL 165(H) 111(H)  BUN 8 - 23 mg/dL 21 24(H)  Creatinine 0.61 - 1.24 mg/dL 1.33(H) 1.53(H)  Sodium 135 - 145 mmol/L 139 137  Potassium 3.5 - 5.1 mmol/L 5.1 4.3  Chloride 98 - 111 mmol/L 99 96(L)  CO2 22 - 32 mmol/L 34(H) 34(H)  Calcium 8.9 - 10.3 mg/dL 9.1 9.5     NUTRITION - FOCUSED PHYSICAL EXAM: Nutrition-Focused physical exam completed. Findings are mild upper arm fat depletion, mild deltiod muscle depletion, and no edema.     Diet Order:   Diet Order            Diet Heart Room service appropriate? Yes; Fluid consistency: Thin  Diet effective now                 EDUCATION NEEDS:  Education  needs have been addressed  Skin:  Skin Assessment: Reviewed RN Assessment  Last BM:  5/8  Height:   Ht Readings from Last 1 Encounters:  01/22/21 5' 6.5" (1.689 m)    Weight:   Wt Readings from Last 1 Encounters:  01/22/21 80.6 kg    Ideal Body Weight:   67 kg  BMI:  There is no height or weight on file to calculate BMI.  Estimated Nutritional Needs:   Kcal:  2015-2176  Protein:  96-105 gr  Fluid:  > 2 liters daily   Colman Cater MS,RD,CSG,LDN Contact: [email or web address]

## 2021-04-07 DIAGNOSIS — J441 Chronic obstructive pulmonary disease with (acute) exacerbation: Secondary | ICD-10-CM | POA: Diagnosis not present

## 2021-04-07 LAB — BASIC METABOLIC PANEL
Anion gap: 9 (ref 5–15)
BUN: 36 mg/dL — ABNORMAL HIGH (ref 8–23)
CO2: 38 mmol/L — ABNORMAL HIGH (ref 22–32)
Calcium: 8.9 mg/dL (ref 8.9–10.3)
Chloride: 92 mmol/L — ABNORMAL LOW (ref 98–111)
Creatinine, Ser: 1.56 mg/dL — ABNORMAL HIGH (ref 0.61–1.24)
GFR, Estimated: 46 mL/min — ABNORMAL LOW (ref >60)
Glucose, Bld: 149 mg/dL — ABNORMAL HIGH (ref 70–99)
Potassium: 3.3 mmol/L — ABNORMAL LOW (ref 3.5–5.1)
Sodium: 139 mmol/L (ref 135–145)

## 2021-04-07 MED ORDER — IPRATROPIUM-ALBUTEROL 0.5-2.5 (3) MG/3ML IN SOLN
3.0000 mL | Freq: Two times a day (BID) | RESPIRATORY_TRACT | Status: DC
  Administered 2021-04-07 – 2021-04-08 (×2): 3 mL via RESPIRATORY_TRACT
  Filled 2021-04-07 (×2): qty 3

## 2021-04-07 MED ORDER — TORSEMIDE 20 MG PO TABS: 40.0000 mg | ORAL_TABLET | Freq: Every day | ORAL | Status: DC

## 2021-04-07 MED ORDER — PROPAFENONE HCL 225 MG PO TABS
225.0000 mg | ORAL_TABLET | Freq: Three times a day (TID) | ORAL | Status: DC
  Administered 2021-04-07 – 2021-04-08 (×3): 225 mg via ORAL
  Filled 2021-04-07 (×6): qty 1

## 2021-04-07 MED ORDER — TORSEMIDE 20 MG PO TABS
40.0000 mg | ORAL_TABLET | Freq: Every day | ORAL | Status: DC
  Administered 2021-04-08: 40 mg via ORAL
  Filled 2021-04-07: qty 2

## 2021-04-07 MED ORDER — PANTOPRAZOLE SODIUM 40 MG PO TBEC
40.0000 mg | DELAYED_RELEASE_TABLET | Freq: Every day | ORAL | Status: DC
  Administered 2021-04-07 – 2021-04-08 (×2): 40 mg via ORAL
  Filled 2021-04-07 (×2): qty 1

## 2021-04-07 MED ORDER — POTASSIUM CHLORIDE CRYS ER 20 MEQ PO TBCR
40.0000 meq | EXTENDED_RELEASE_TABLET | Freq: Once | ORAL | Status: AC
  Administered 2021-04-07: 40 meq via ORAL
  Filled 2021-04-07: qty 2

## 2021-04-07 NOTE — Progress Notes (Signed)
In for am assessment, pt awake, alert and oriented. Pt HOH. Pt with noted tremors of hands/arm, worse with use of hands but also fine tremors at rest. Pt's urinal emptied of 563m of clear yellow urine. Pt's pajama pants and bed noted to be wet, attempted to clean pt and change bed linens but pt refused. States, "I'll just wait. I didn't rest good last night because of this pain in my side and I don't feel like moving around right now. The wet ain't bothering me right now." Advised pt to call when he was ready to get cleaned. Pt stated understanding. Breakfast tray delivered and pt set up for meal.

## 2021-04-07 NOTE — Evaluation (Signed)
Occupational Therapy Evaluation Patient Details Name: Daniel Barker MRN: [id number] DOB: 1945/12/21 Today's Date: 04/07/2021    History of Present Illness Daniel Barker is a 75 y.o. male with c/o R sided chest pain. PE ruled out, CT abdomen and pelvic showed L renal stone. PMH: asthma, COPD, chronic hypoxic respiratory failure on 2 L of oxygen, afib, BPH, CKD, CAD, HTN, CABG   Clinical Impression   Pt agreeable to OT evaluation. No sign of desaturation this date with SpO2 remaining consistently around 93% seated and standing at sink donning 3 L O2. Pt demonstrates ADL's at a level of Mod I as seen by donning socks at EOB and washing hands at sink. Pt presents with R UE A/ROM limitations which are reportedly consistent with baseline performance due to a previous injury. Pt is not recommended for further OT services in the hospital setting and will be discharged to care of nursing staff for the remainder of his stay.     Follow Up Recommendations  No OT follow up    Equipment Recommendations  None recommended by OT           Precautions / Restrictions Precautions Precautions: Other (comment) Precaution Comments: monitor O2 Restrictions Weight Bearing Restrictions: No      Mobility Bed Mobility Overal bed mobility: Modified Independent             General bed mobility comments: slightly increased time    Transfers Overall transfer level: Modified independent   Transfers: Sit to/from Stand;Stand Pivot Transfers Sit to Stand: Modified independent (Device/Increase time) Stand pivot transfers: Modified independent (Device/Increase time)       General transfer comment: ambulatory transfer from EOB to standing at sink to seated in chair.    Balance Overall balance assessment: No apparent balance deficits (not formally assessed)                                         ADL either performed or assessed with clinical judgement   ADL Overall ADL's :  Modified independent                                       General ADL Comments: donned sock at EOB; washing hands standing at sink                         Pertinent Vitals/Pain Pain Assessment: No/denies pain     Hand Dominance Left   Extremity/Trunk Assessment Upper Extremity Assessment Upper Extremity Assessment: Overall WFL for tasks assessed;LUE deficits/detail LUE Deficits / Details: limited shoulder flexion A/ROM ~80% due to previous shoulder injury.   Lower Extremity Assessment Lower Extremity Assessment: Defer to PT evaluation   Cervical / Trunk Assessment Cervical / Trunk Assessment: Normal   Communication Communication Communication: HOH   Cognition Arousal/Alertness: Awake/alert Behavior During Therapy: WFL for tasks assessed/performed Overall Cognitive Status: Within Functional Limits for tasks assessed                                     General Comments  Pt on 3 L O2. SpO2 93% when seated and 93% while standing at sink.  Home Living Family/patient expects to be discharged to:: Private residence Living Arrangements: Spouse/significant other;Children (daughter  and granddaughter) Available Help at Discharge: Family;Available PRN/intermittently Type of Home: Mobile home Home Access: Stairs to enter Entrance Stairs-Number of Steps: 4-5 Entrance Stairs-Rails: Right;Left Home Layout: One level     Bathroom Shower/Tub: Tub/shower unit             Additional Comments: Pt's spouse has a RW and BSC, but she is currently using them with HHPT, pt's spouse recently d/c home from SNF after hip replacement (Pt history taken from document review)      Prior Functioning/Environment Level of Independence: Independent        Comments: Pt ind with community ambulation, ADLs/IADLs, drives, denies falls, using O2 PRN.                      OT Goals(Current goals can be found in the care plan  section) Acute Rehab OT Goals Patient Stated Goal: return home  OT Frequency:      End of Session Equipment Utilized During Treatment: Oxygen  Activity Tolerance: Patient tolerated treatment well Patient left: in chair;with call bell/phone within reach  OT Visit Diagnosis: Unsteadiness on feet (R26.81)                Time: [id number] OT Time Calculation (min): 10 min Charges:  OT General Charges $OT Visit: 1 Visit OT Evaluation $OT Eval Low Complexity: 1 Low  Marcelis Wissner OT, MOT   Daniel Barker 04/07/2021, 9:52 AM

## 2021-04-07 NOTE — Progress Notes (Signed)
Patient Demographics:    Daniel Barker, is a 75 y.o. male, DOB - 1946-05-10, [id number]  Admit date - 04/05/2021   Admitting Physician Darliss Cheney, MD  Outpatient Primary MD for the patient is [name], MD  LOS - 2   Chief Complaint  Patient presents with  . Shortness of Breath        Subjective:    Daniel Barker  today has no fevers, no emesis,  No chest pain,   - Dyspnea Exertion persist -Continues to have cough and wheezing spells, requiring up to 3 to 4 L of oxygen via nasal cannula  Assessment  & Plan :    Principal Problem:   Acute exacerbation of chronic obstructive pulmonary disease (COPD) (HCC) Active Problems:   Chronic respiratory failure with hypoxia and hypercapnia (HCC)   CAD (coronary atherosclerotic disease)   Cigarette smoker   Benign prostatic hyperplasia with urinary obstruction   Hypertension  Brief Summary:- 75 y.o. male with medical history significant of asthma, COPD, chronic hypoxic respiratory failure on 2 L of oxygen,, chronic atrial fibrillation, BPH, currently Cortril dependence, CKD stage IIIa, CAD admitted on 04/05/2021 with acute COPD exacerbation, acute on chronic worsening hypoxic respiratory failure ,patient admits to ongoing tobacco use  A/p 1) acute COPD exacerbation-- Dyspnea Exertion persist -Continues to have cough and wheezing spells, requiring up to 3 to 4 L of oxygen via nasal cannula -Continue IV Solu-Medrol and transition to prednisone with improvement, continue bronchodilators and mucolytics -Continue azithromycin -Patient is not interested in smoking cessation  2) chronic atrial fibrillation--continue apixaban for stroke prophylaxis, As per  pharmacy  propafenone (RYTHMOL) dose is 225 q 8 hrs  3)HTN--BP soft, stop amlodipine   4) acute on chronic hypoxic respiratory failure secondary to #1 above -Treat as above #1 continue  supplemental oxygen currently on 3 to 4 L/min  5)CAD-stable, no cp, c/n plavix   6)BPH--continue Flomax 0.4 mg  Other--apparently patient is taking torsemide 100 mg daily---decreased oral 40 mg daily due to very soft BP  Disposition/Need for in-Hospital Stay- patient unable to be discharged at this time due to --worsening hypoxia and COPD exacerbation requiring IV steroids and supplemental oxygen*  Status is: Inpatient  Remains inpatient appropriate because:Please see disposition above   Disposition: The patient is from: Home              Anticipated d/c is to: Home              Anticipated d/c date is: 2 days              Patient currently is not medically stable to d/c. Barriers: Not Clinically Stable-   Code Status : -  Code Status: Full Code   Family Communication:    NA (patient is alert, awake and coherent)   Consults  :  na  DVT Prophylaxis  :   - SCDs   apixaban (ELIQUIS) tablet 2.5 mg Start: 04/05/21 2245 apixaban (ELIQUIS) tablet 2.5 mg    Lab Results  Component Value Date   PLT 174 04/06/2021    Inpatient Medications  Scheduled Meds: . amLODipine  2.5 mg Oral Daily  . apixaban  2.5 mg Oral BID  . azithromycin  500 mg Oral QHS  .  clopidogrel  75 mg Oral Daily  . feeding supplement  237 mL Oral BID BM  . ipratropium-albuterol  3 mL Nebulization BID  . lidocaine  1 patch Transdermal Q24H  . mometasone-formoterol  2 puff Inhalation BID  . montelukast  10 mg Oral QHS  . predniSONE  40 mg Oral Q breakfast  . tamsulosin  0.4 mg Oral Daily  . [START ON 04/08/2021] torsemide  40 mg Oral Daily   Continuous Infusions: PRN Meds:.    Anti-infectives (From admission, onward)   Start     Dose/Rate Route Frequency Ordered Stop   04/06/21 2200  azithromycin (ZITHROMAX) tablet 500 mg       "Followed by" Linked Group Details   500 mg Oral Daily at bedtime 04/05/21 2206 04/10/21 2159   04/05/21 2300  azithromycin (ZITHROMAX) 500 mg in sodium chloride 0.9 % 250 mL  IVPB       "Followed by" Linked Group Details   500 mg 250 mL/hr over 60 Minutes Intravenous Every 24 hours 04/05/21 2206 04/06/21 0100        Objective:   Vitals:   04/07/21 0602 04/07/21 0719 04/07/21 1306 04/07/21 1917  BP: 117/69 138/63 (!) 99/51   Pulse: 88 90 81   Resp: '19 18 18   '$ Temp: 98.2 F (36.8 C) (!) 97.5 F (36.4 C) 97.9 F (36.6 C)   TempSrc:  Oral    SpO2: 96% 97% 97% 92%    Wt Readings from Last 3 Encounters:  01/22/21 80.6 kg  01/08/21 79.4 kg  12/09/20 80.5 kg     Intake/Output Summary (Last 24 hours) at 04/07/2021 1951 Last data filed at 04/07/2021 [id number] Gross per 24 hour  Intake --  Output 800 ml  Net -800 ml     Physical Exam  Gen:- Awake Alert, dyspnea on exertion HEENT:- [place].AT, No sclera icterus Nose- [place] 3L/min Neck-Supple Neck,No JVD,.  Lungs-diminished breath sounds, scattered wheezes, no rales CV- S1, S2 normal, irregular  Abd-  +ve B.Sounds, Abd Soft, No tenderness,    Extremity/Skin:- No  edema, pedal pulses present  Psych-affect is appropriate, oriented x3 Neuro-no new focal deficits, no tremors   Data Review:   Micro Results Recent Results (from the past 240 hour(s))  Resp Panel by RT-PCR (Flu A&B, Covid) Nasopharyngeal Swab     Status: None   Collection Time: 04/05/21  6:52 PM   Specimen: Nasopharyngeal Swab; Nasopharyngeal(NP) swabs in vial transport medium  Result Value Ref Range Status   SARS Coronavirus 2 by RT PCR NEGATIVE NEGATIVE Final    Comment: (NOTE) SARS-CoV-2 target nucleic acids are NOT DETECTED.  The SARS-CoV-2 RNA is generally detectable in upper respiratory specimens during the acute phase of infection. The lowest concentration of SARS-CoV-2 viral copies this assay can detect is 138 copies/mL. A negative result does not preclude SARS-Cov-2 infection and should not be used as the sole basis for treatment or other patient management decisions. A negative result may occur with  improper specimen  collection/handling, submission of specimen other than nasopharyngeal swab, presence of viral mutation(s) within the areas targeted by this assay, and inadequate number of viral copies(<138 copies/mL). A negative result must be combined with clinical observations, patient history, and epidemiological information. The expected result is Negative.  Fact Sheet for Patients:  EntrepreneurPulse.com.au  Fact Sheet for Healthcare Providers:  IncredibleEmployment.be  This test is no t yet approved or cleared by the [place] FDA and  has been authorized for detection and/or diagnosis of SARS-CoV-2  by FDA under an Emergency Use Authorization (EUA). This EUA will remain  in effect (meaning this test can be used) for the duration of the COVID-19 declaration under Section 564(b)(1) of the Act, 21 U.S.C.section 360bbb-3(b)(1), unless the authorization is terminated  or revoked sooner.       Influenza A by PCR NEGATIVE NEGATIVE Final   Influenza B by PCR NEGATIVE NEGATIVE Final    Comment: (NOTE) The Xpert Xpress SARS-CoV-2/FLU/RSV plus assay is intended as an aid in the diagnosis of influenza from Nasopharyngeal swab specimens and should not be used as a sole basis for treatment. Nasal washings and aspirates are unacceptable for Xpert Xpress SARS-CoV-2/FLU/RSV testing.  Fact Sheet for Patients: EntrepreneurPulse.com.au  Fact Sheet for Healthcare Providers: IncredibleEmployment.be  This test is not yet approved or cleared by the [place] FDA and has been authorized for detection and/or diagnosis of SARS-CoV-2 by FDA under an Emergency Use Authorization (EUA). This EUA will remain in effect (meaning this test can be used) for the duration of the COVID-19 declaration under Section 564(b)(1) of the Act, 21 U.S.C. section 360bbb-3(b)(1), unless the authorization is terminated or revoked.  Performed at [facility], [address] [place], [place] [address]     Radiology Reports CT Angio Chest PE W and/or Wo Contrast  Result Date: 04/05/2021 CLINICAL DATA:  Worsening shortness of breath and abdominal distension, initial encounter EXAM: CT ANGIOGRAPHY CHEST CT ABDOMEN AND PELVIS WITH CONTRAST TECHNIQUE: Multidetector CT imaging of the chest was performed using the standard protocol during bolus administration of intravenous contrast. Multiplanar CT image reconstructions and MIPs were obtained to evaluate the vascular anatomy. Multidetector CT imaging of the abdomen and pelvis was performed using the standard protocol during bolus administration of intravenous contrast. CONTRAST:  80m OMNIPAQUE IOHEXOL 350 MG/ML SOLN COMPARISON:  Chest x-ray from earlier in the same day, 05/15/2020. FINDINGS: CTA CHEST FINDINGS Cardiovascular: Atherosclerotic calcifications of the thoracic aorta are noted. No aneurysmal dilatation or dissection is seen. Changes of prior coronary bypass grafting are noted. Heavy coronary calcifications are seen. Pulmonary artery shows a normal branching pattern bilaterally. No filling defect is identified to suggest pulmonary embolism. Mediastinum/Nodes: Thoracic inlet is within normal limits. No sizable hilar or mediastinal adenopathy is noted. The esophagus as visualized is within normal limits. Lungs/Pleura: Lungs are well aerated bilaterally. Diffuse emphysematous changes are seen. 5 mm nodule is noted in the posterior aspect of the right upper lobe best seen on image number 54 of series 8. Small pleural effusion is noted on the right. No focal infiltrate is seen. Musculoskeletal: Compression deformity at T8 is identified this is stable in appearance from prior exam of 2021 degenerative changes are seen. Findings of prior median sternotomy are noted. No rib abnormality is seen. Review of the MIP images confirms the above findings. CT ABDOMEN and PELVIS FINDINGS Hepatobiliary: No focal liver  abnormality is seen. No gallstones, gallbladder wall thickening, or biliary dilatation. Pancreas: Unremarkable. No pancreatic ductal dilatation or surrounding inflammatory changes. Spleen: Normal in size without focal abnormality. Adrenals/Urinary Tract: Adrenal glands are within normal limits. Tiny nonobstructing stone is noted in the upper pole of the left kidney best seen on image number 24 series 2. Diffuse renal vascular calcifications are noted bilaterally. No obstructive changes are seen. Bladder is within normal limits. Stomach/Bowel: The appendix is within normal limits. No obstructive or inflammatory changes of the colon are seen. Small bowel and stomach appear within normal limits. Vascular/Lymphatic: Aortic atherosclerosis. No enlarged abdominal or pelvic lymph  nodes. Reproductive: Prostate is unremarkable. Other: No abdominal wall hernia or abnormality. No abdominopelvic ascites. Musculoskeletal: No acute or significant osseous findings. 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 Review of the MIP images confirms the above findings. IMPRESSION: CTA of the chest: No evidence of pulmonary emboli. 5 mm nodule in the posterior aspect of the right upper lobe. No follow-up needed if patient is low-risk. Non-contrast chest CT can be considered in 12 months if patient is high-risk. This recommendation follows the consensus statement: Guidelines for Management of Incidental Pulmonary Nodules Detected on CT Images: From the Fleischner Society 2017; Radiology 2017; 284:228-243. Chronic T8 compression deformity. CT of the abdomen and pelvis: Tiny nonobstructing left renal stone in the upper pole. Chronic changes at L5 with anterolisthesis. Electronically Signed   By: Inez Catalina M.D.   On: 04/05/2021 20:43   CT ABDOMEN PELVIS W CONTRAST  Result Date: 04/05/2021 CLINICAL DATA:  Worsening shortness of breath and abdominal distension, initial encounter EXAM: CT ANGIOGRAPHY CHEST CT  ABDOMEN AND PELVIS WITH CONTRAST TECHNIQUE: Multidetector CT imaging of the chest was performed using the standard protocol during bolus administration of intravenous contrast. Multiplanar CT image reconstructions and MIPs were obtained to evaluate the vascular anatomy. Multidetector CT imaging of the abdomen and pelvis was performed using the standard protocol during bolus administration of intravenous contrast. CONTRAST:  65m OMNIPAQUE IOHEXOL 350 MG/ML SOLN COMPARISON:  Chest x-ray from earlier in the same day, 05/15/2020. FINDINGS: CTA CHEST FINDINGS Cardiovascular: Atherosclerotic calcifications of the thoracic aorta are noted. No aneurysmal dilatation or dissection is seen. Changes of prior coronary bypass grafting are noted. Heavy coronary calcifications are seen. Pulmonary artery shows a normal branching pattern bilaterally. No filling defect is identified to suggest pulmonary embolism. Mediastinum/Nodes: Thoracic inlet is within normal limits. No sizable hilar or mediastinal adenopathy is noted. The esophagus as visualized is within normal limits. Lungs/Pleura: Lungs are well aerated bilaterally. Diffuse emphysematous changes are seen. 5 mm nodule is noted in the posterior aspect of the right upper lobe best seen on image number 54 of series 8. Small pleural effusion is noted on the right. No focal infiltrate is seen. Musculoskeletal: Compression deformity at T8 is identified this is stable in appearance from prior exam of 2021 degenerative changes are seen. Findings of prior median sternotomy are noted. No rib abnormality is seen. Review of the MIP images confirms the above findings. CT ABDOMEN and PELVIS FINDINGS Hepatobiliary: No focal liver abnormality is seen. No gallstones, gallbladder wall thickening, or biliary dilatation. Pancreas: Unremarkable. No pancreatic ductal dilatation or surrounding inflammatory changes. Spleen: Normal in size without focal abnormality. Adrenals/Urinary Tract: Adrenal  glands are within normal limits. Tiny nonobstructing stone is noted in the upper pole of the left kidney best seen on image number 24 series 2. Diffuse renal vascular calcifications are noted bilaterally. No obstructive changes are seen. Bladder is within normal limits. Stomach/Bowel: The appendix is within normal limits. No obstructive or inflammatory changes of the colon are seen. Small bowel and stomach appear within normal limits. Vascular/Lymphatic: Aortic atherosclerosis. No enlarged abdominal or pelvic lymph nodes. Reproductive: Prostate is unremarkable. Other: No abdominal wall hernia or abnormality. No abdominopelvic ascites. Musculoskeletal: No acute or significant osseous findings. 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 Review of the MIP images confirms the above findings. IMPRESSION: CTA of the chest: No evidence of pulmonary emboli. 5 mm nodule in the posterior aspect of the right upper lobe. No follow-up needed if patient  is low-risk. Non-contrast chest CT can be considered in 12 months if patient is high-risk. This recommendation follows the consensus statement: Guidelines for Management of Incidental Pulmonary Nodules Detected on CT Images: From the Fleischner Society 2017; Radiology 2017; 284:228-243. Chronic T8 compression deformity. CT of the abdomen and pelvis: Tiny nonobstructing left renal stone in the upper pole. Chronic changes at L5 with anterolisthesis. Electronically Signed   By: Inez Catalina M.D.   On: 04/05/2021 20:43   DG Chest Portable 1 View  Result Date: 04/05/2021 CLINICAL DATA:  Shortness of breath.  Pain. EXAM: PORTABLE CHEST 1 VIEW COMPARISON:  Most recent radiograph 05/15/2020 FINDINGS: Post median sternotomy and CABG. Borderline cardiomegaly. Unchanged mediastinal contours with aortic atherosclerosis. Chronic hyperinflation. Increased peribronchial thickening from prior exam. Linear opacity in the right mid lung may represent fluid in the  fissure or atelectasis. No significant sub pulmonic effusion. No pneumothorax. Known left humeral shaft fracture is only partially included in the field of view. IMPRESSION: 1. Increased peribronchial thickening from prior exam, may be pulmonary edema or acute bronchitis superimposed on COPD. 2. Linear opacity in the right mid lung may represent fluid in the fissure or atelectasis. Electronically Signed   By: Keith Rake M.D.   On: 04/05/2021 19:28   DG Humerus Left  Result Date: 03/31/2021 [facility] Radiology Report Dictated by Dr. [name] Chief complaint humerus fracture on January 7 Images: AP lateral left humerus Slightly above midshaft fracture left humerus which appears to have more angulation than it did last time However we do see good fracture callus around the fracture on all films indicating good progression towards healing     CBC Recent Labs  Lab 04/05/21 1846 04/06/21 0654  WBC 7.6 4.6  HGB 12.5* 12.2*  HCT 40.0 39.3  PLT 204 174  MCV 101.0* 101.8*  MCH 31.6 31.6  MCHC 31.3 31.0  RDW 14.4 14.3  LYMPHSABS 1.3 0.4*  MONOABS 0.7 0.0*  EOSABS 0.4 0.0  BASOSABS 0.1 0.0    Chemistries  Recent Labs  Lab 04/05/21 1846 04/06/21 0654 04/07/21 0525  NA 137 139 139  K 4.3 5.1 3.3*  CL 96* 99 92*  CO2 34* 34* 38*  GLUCOSE 111* 165* 149*  BUN 24* 21 36*  CREATININE 1.53* 1.33* 1.56*  CALCIUM 9.5 9.1 8.9  MG 2.1  --   --   AST 14*  --   --   ALT 10  --   --   ALKPHOS 67  --   --   BILITOT 0.6  --   --    ------------------------------------------------------------------------------------------------------------------ No results for input(s): CHOL, HDL, LDLCALC, TRIG, CHOLHDL, LDLDIRECT in the last 72 hours.  No results found for: HGBA1C ------------------------------------------------------------------------------------------------------------------ No results for input(s): TSH, T4TOTAL, T3FREE, THYROIDAB in the last 72 hours.  Invalid  input(s): FREET3 ------------------------------------------------------------------------------------------------------------------ No results for input(s): VITAMINB12, FOLATE, FERRITIN, TIBC, IRON, RETICCTPCT in the last 72 hours.  Coagulation profile No results for input(s): INR, PROTIME in the last 168 hours.  Recent Labs    04/05/21 1846  DDIMER 5.19*    Cardiac Enzymes No results for input(s): CKMB, TROPONINI, MYOGLOBIN in the last 168 hours.  Invalid input(s): CK ------------------------------------------------------------------------------------------------------------------    Component Value Date/Time   BNP 190.0 (H) 04/05/2021 1847     Roxan Hockey M.D on 04/07/2021 at 7:51 PM  Go to [email or web address] - for contact info  [facility] - Office  [phone]

## 2021-04-08 DIAGNOSIS — J441 Chronic obstructive pulmonary disease with (acute) exacerbation: Secondary | ICD-10-CM | POA: Diagnosis not present

## 2021-04-08 LAB — RENAL FUNCTION PANEL
Albumin: 3 g/dL — ABNORMAL LOW (ref 3.5–5.0)
Anion gap: 4 — ABNORMAL LOW (ref 5–15)
BUN: 48 mg/dL — ABNORMAL HIGH (ref 8–23)
CO2: 40 mmol/L — ABNORMAL HIGH (ref 22–32)
Calcium: 8.7 mg/dL — ABNORMAL LOW (ref 8.9–10.3)
Chloride: 95 mmol/L — ABNORMAL LOW (ref 98–111)
Creatinine, Ser: 1.92 mg/dL — ABNORMAL HIGH (ref 0.61–1.24)
GFR, Estimated: 36 mL/min — ABNORMAL LOW (ref >60)
Glucose, Bld: 112 mg/dL — ABNORMAL HIGH (ref 70–99)
Phosphorus: 2.9 mg/dL (ref 2.5–4.6)
Potassium: 3.7 mmol/L (ref 3.5–5.1)
Sodium: 139 mmol/L (ref 135–145)

## 2021-04-08 MED ORDER — LOPERAMIDE HCL 2 MG PO CAPS
2.0000 mg | ORAL_CAPSULE | Freq: Once | ORAL | Status: AC
  Administered 2021-04-08: 2 mg via ORAL
  Filled 2021-04-08: qty 1

## 2021-04-08 MED ORDER — CHANTIX STARTING MONTH PAK 0.5 MG X 11 & 1 MG X 42 PO TABS: ORAL_TABLET | ORAL | 0 refills | Status: DC

## 2021-04-08 MED ORDER — PREDNISONE 20 MG PO TABS: 40.0000 mg | ORAL_TABLET | Freq: Every day | ORAL | 0 refills | Status: AC

## 2021-04-08 MED ORDER — TORSEMIDE 100 MG PO TABS: 50.0000 mg | ORAL_TABLET | Freq: Every day | ORAL | 1 refills | Status: DC

## 2021-04-08 MED ORDER — PANTOPRAZOLE SODIUM 40 MG PO TBEC: 40.0000 mg | DELAYED_RELEASE_TABLET | Freq: Every day | ORAL | 1 refills | Status: DC

## 2021-04-08 NOTE — Discharge Summary (Signed)
Daniel Barker, is a 75 y.o. male  DOB 1945-12-09  MRN [id number].  Admission date:  04/05/2021  Admitting Physician  Darliss Cheney, MD  Discharge Date:  04/08/2021   Primary MD  [name], MD  Recommendations for primary care physician for things to follow:   1)You are taking Apixaban/Eliquis and Plavix which are blood thinners so Please Avoid Avoid ibuprofen/Advil/Aleve/Motrin/Goody Powders/Naproxen/BC powders/Meloxicam/Diclofenac/Indomethacin and other Nonsteroidal anti-inflammatory medications as these will make you more likely to bleed and can cause stomach ulcers, can also cause Kidney problems.   2)You need oxygen at home at 2 L via nasal cannula continuously while asleep--- smoking or having open fires around oxygen can cause fire, significant injury and death  3)Very low-salt diet advised  4)Weigh yourself daily, call if you gain more than 3 pounds in 1 day or more than 5 pounds in 1 week as your diuretic medications may need to be adjusted  5)Limit your Fluid  intake to no more than 60 ounces (1.8 Liters) per day  6) complete abstinence from tobacco/smoking cessation strongly advised  Admission Diagnosis  Acute exacerbation of chronic obstructive pulmonary disease (COPD) ([place]) [J44.1] COPD exacerbation ([place]) [J44.1]   Discharge Diagnosis  Acute exacerbation of chronic obstructive pulmonary disease (COPD) ([place]) [J44.1] COPD exacerbation (HCC) [J44.1]   Principal Problem:   Acute exacerbation of chronic obstructive pulmonary disease (COPD) ([place]) Active Problems:   Chronic respiratory failure with hypoxia and hypercapnia (HCC)   CAD (coronary atherosclerotic disease)   Cigarette smoker   Benign prostatic hyperplasia with urinary obstruction   Hypertension      Past Medical History:  Diagnosis Date  . Asthma   . Atrial fibrillation ([place])   . BPH (benign prostatic hyperplasia)   .  Cigarette smoker   . CKD (chronic kidney disease)   . COPD (chronic obstructive pulmonary disease) ([place])   . Coronary artery disease   . Cough   . High cholesterol   . Hx of CABG   . Hypercholesteremia   . Hypertension   . Localized edema   . Stented coronary artery     Past Surgical History:  Procedure Laterality Date  . CARDIAC SURGERY    . CORONARY ARTERY BYPASS GRAFT       HPI  from the history and physical done on the day of admission:    Chief Complaint: right sided chest pain  HPI: Farrel Giesel is a 75 y.o. male with medical history significant of asthma, COPD, chronic hypoxic respiratory failure on 2 L of oxygen,, chronic atrial fibrillation, BPH, currently Cortril dependence, CKD stage IIIa, CAD and multiple other comorbidities was brought into the emergency department with the complaint of right-sided chest pain.  Daughter at the bedside.  According with patient and verified by the daughter, patient started having right anterior chest pain at around 1 in the morning.  The pain has been constant since then and sharp with no radiation and no relieving factor but aggravates with deep breathing.  Patient also has been having worsening  shortness of breath ever since.  He denies any cough, fever, chills, nausea, vomiting, any abdominal pain, any problem with urination or with bowel movements.  No sick contact.  He continues to smoke half a pack a day.  ED Course: Upon arrival to ED, patient was diffusely wheezy but not tachypneic.  Blood pressure and heart rate were normal.  He was requiring 4 L of oxygen to keep his oxygen saturation over 90%.  D-dimer was elevated so follow-up CT angiogram of the chest was done which ruled out PE.  CT abdomen and pelvis showed tiny nonobstructive left renal stone but no other acute pathology to explain his pain.  BMP showed CKD stage IIIa.  Lipase normal.  COVID-negative.  Review of Systems: As per HPI otherwise negative.      Hospital  Course:      Brief Summary:- 75 y.o.malewith medical history significant ofasthma, COPD, chronic hypoxic respiratory failure on 2 L of oxygen,, chronic atrial fibrillation, BPH, currently Cortril dependence, CKD stage IIIa, CAD admitted on 04/05/2021 with acute COPD exacerbation, acute on chronic worsening hypoxic respiratory failure ,patient admits to ongoing tobacco use  A/p 1) acute COPD exacerbation-- -overall much improved after IV steroids, bronchiodilators and mucolytics along with azithromycin -Discharged home on p.o. prednisone  2) chronic atrial fibrillation--continue apixaban for stroke prophylaxis, As per  pharmacy  propafenone (RYTHMOL) dose is 225 q 8 hrs  3)HTN--BP soft, stop amlodipine   4) acute on chronic hypoxic respiratory failure secondary to #1 above -Respiratory status and hypoxia improved -Overall improved after treatment as above in #1, okay to discharge on 2 L of oxygen via nasal cannula  5)CAD-stable, no cp, c/n plavix   6)BPH--continue Flomax 0.4 mg  7) tobacco abuse--smoking cessation advised, patient would like to try Chantix -Side effect profile of Chantix discussed with patient, questions answered  Other--follow-up with PCP and cardiologist to adjust torsemide dosage   disposition-home  Disposition: The patient is from: Home  Anticipated d/c is to: Home  Code Status : -  Code Status: Full Code   Family Communication:    (patient is alert, awake and coherent)   Consults  :  na   Discharge Condition: stable  Follow UP   Follow-up Information    [name], MD. Schedule an appointment as soon as possible for a visit in 1 week(s).   Specialty: Internal Medicine Why: copd fu Contact information: [address] [place] [facility] [address] [phone]        Josue Hector, MD .   Specialty: Cardiology Contact information: [phone] [address] [place] [address] [phone]                 Diet and Activity recommendation:  As advised  Discharge Instructions    Discharge Instructions    Call MD for:  difficulty breathing, headache or visual disturbances   Complete by: As directed    Call MD for:  persistant dizziness or light-headedness   Complete by: As directed    Call MD for:  persistant nausea and vomiting   Complete by: As directed    Call MD for:  temperature >100.4   Complete by: As directed    Diet - low sodium heart healthy   Complete by: As directed    Discharge instructions   Complete by: As directed    1)You are taking Apixaban/Eliquis and Plavix which are blood thinners so Please Avoid Avoid ibuprofen/Advil/Aleve/Motrin/Goody Powders/Naproxen/BC powders/Meloxicam/Diclofenac/Indomethacin and other Nonsteroidal anti-inflammatory  medications as these will make you more likely to bleed and can cause stomach ulcers, can also cause Kidney problems.   2)You need oxygen at home at 2 L via nasal cannula continuously while asleep--- smoking or having open fires around oxygen can cause fire, significant injury and death  3)Very low-salt diet advised  4)Weigh yourself daily, call if you gain more than 3 pounds in 1 day or more than 5 pounds in 1 week as your diuretic medications may need to be adjusted  5)Limit your Fluid  intake to no more than 60 ounces (1.8 Liters) per day  6) complete abstinence from tobacco/smoking cessation strongly advised   Increase activity slowly   Complete by: As directed         Discharge Medications     Allergies as of 04/08/2021      Reactions   Lipitor [atorvastatin]       Medication List    STOP taking these medications   amLODipine 5 MG tablet Commonly known as: NORVASC   azithromycin 250 MG tablet Commonly known as: ZITHROMAX   doxycycline 100 MG tablet Commonly known as: VIBRA-TABS     TAKE these medications   acetaminophen 500 MG tablet Commonly known as: TYLENOL Take 1,000 mg by mouth  every 6 (six) hours as needed.   albuterol 108 (90 Base) MCG/ACT inhaler Commonly known as: ProAir HFA 2 puffs every 4 hours as needed only  if your can't catch your breath What changed: Another medication with the same name was changed. Make sure you understand how and when to take each.   albuterol (2.5 MG/3ML) 0.083% nebulizer solution Commonly known as: PROVENTIL INHALE THE CONTENTS OF 1 VIAL VIA NEBULIZER EVERY 6 HOURS AS NEEDED FOR WHEEZING OR SHORTNESS OF BREATH What changed: See the new instructions.   apixaban 2.5 MG Tabs tablet Commonly known as: ELIQUIS Take 2.5 mg by mouth 2 (two) times daily.   budesonide-formoterol 160-4.5 MCG/ACT inhaler Commonly known as: Symbicort Take 2 puffs first thing in am and then another 2 puffs about 12 hours later.   Chantix Starting Month Pak 0.5 MG X 11 & 1 MG X 42 tablet Generic drug: varenicline Take one 0.5 mg tablet by mouth once daily for 3 days, then increase to one 0.5 mg tablet twice daily for 4 days, then increase to one 1 mg tablet twice daily.   clopidogrel 75 MG tablet Commonly known as: PLAVIX Take 75 mg by mouth daily.   fenofibrate micronized 134 MG capsule Commonly known as: LOFIBRA Take 134 mg by mouth daily before breakfast.   montelukast 10 MG tablet Commonly known as: SINGULAIR Take 10 mg by mouth at bedtime.   pantoprazole 40 MG tablet Commonly known as: PROTONIX Take 1 tablet (40 mg total) by mouth daily. Start taking on: Apr 09, 2021   predniSONE 20 MG tablet Commonly known as: DELTASONE Take 2 tablets (40 mg total) by mouth daily with breakfast for 5 days. What changed:   medication strength  how much to take  how to take this  when to take this  additional instructions   propafenone 225 MG tablet Commonly known as: RYTHMOL Take 1 tablet (225 mg total) by mouth every 8 (eight) hours.   tamsulosin 0.4 MG Caps capsule Commonly known as: FLOMAX Take 1 capsule (0.4 mg total) by mouth  daily.   torsemide 100 MG tablet Commonly known as: DEMADEX Take 0.5 tablets (50 mg total) by mouth daily. What changed: how much to  take       Major procedures and Radiology Reports - PLEASE review detailed and final reports for all details, in brief -   CT Angio Chest PE W and/or Wo Contrast  Result Date: 04/05/2021 CLINICAL DATA:  Worsening shortness of breath and abdominal distension, initial encounter EXAM: CT ANGIOGRAPHY CHEST CT ABDOMEN AND PELVIS WITH CONTRAST TECHNIQUE: Multidetector CT imaging of the chest was performed using the standard protocol during bolus administration of intravenous contrast. Multiplanar CT image reconstructions and MIPs were obtained to evaluate the vascular anatomy. Multidetector CT imaging of the abdomen and pelvis was performed using the standard protocol during bolus administration of intravenous contrast. CONTRAST:  26m OMNIPAQUE IOHEXOL 350 MG/ML SOLN COMPARISON:  Chest x-ray from earlier in the same day, 05/15/2020. FINDINGS: CTA CHEST FINDINGS Cardiovascular: Atherosclerotic calcifications of the thoracic aorta are noted. No aneurysmal dilatation or dissection is seen. Changes of prior coronary bypass grafting are noted. Heavy coronary calcifications are seen. Pulmonary artery shows a normal branching pattern bilaterally. No filling defect is identified to suggest pulmonary embolism. Mediastinum/Nodes: Thoracic inlet is within normal limits. No sizable hilar or mediastinal adenopathy is noted. The esophagus as visualized is within normal limits. Lungs/Pleura: Lungs are well aerated bilaterally. Diffuse emphysematous changes are seen. 5 mm nodule is noted in the posterior aspect of the right upper lobe best seen on image number 54 of series 8. Small pleural effusion is noted on the right. No focal infiltrate is seen. Musculoskeletal: Compression deformity at T8 is identified this is stable in appearance from prior exam of 2021 degenerative changes are seen.  Findings of prior median sternotomy are noted. No rib abnormality is seen. Review of the MIP images confirms the above findings. CT ABDOMEN and PELVIS FINDINGS Hepatobiliary: No focal liver abnormality is seen. No gallstones, gallbladder wall thickening, or biliary dilatation. Pancreas: Unremarkable. No pancreatic ductal dilatation or surrounding inflammatory changes. Spleen: Normal in size without focal abnormality. Adrenals/Urinary Tract: Adrenal glands are within normal limits. Tiny nonobstructing stone is noted in the upper pole of the left kidney best seen on image number 24 series 2. Diffuse renal vascular calcifications are noted bilaterally. No obstructive changes are seen. Bladder is within normal limits. Stomach/Bowel: The appendix is within normal limits. No obstructive or inflammatory changes of the colon are seen. Small bowel and stomach appear within normal limits. Vascular/Lymphatic: Aortic atherosclerosis. No enlarged abdominal or pelvic lymph nodes. Reproductive: Prostate is unremarkable. Other: No abdominal wall hernia or abnormality. No abdominopelvic ascites. Musculoskeletal: No acute or significant osseous findings. 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 Review of the MIP images confirms the above findings. IMPRESSION: CTA of the chest: No evidence of pulmonary emboli. 5 mm nodule in the posterior aspect of the right upper lobe. No follow-up needed if patient is low-risk. Non-contrast chest CT can be considered in 12 months if patient is high-risk. This recommendation follows the consensus statement: Guidelines for Management of Incidental Pulmonary Nodules Detected on CT Images: From the Fleischner Society 2017; Radiology 2017; 284:228-243. Chronic T8 compression deformity. CT of the abdomen and pelvis: Tiny nonobstructing left renal stone in the upper pole. Chronic changes at L5 with anterolisthesis. Electronically Signed   By: MInez CatalinaM.D.   On:  04/05/2021 20:43   CT ABDOMEN PELVIS W CONTRAST  Result Date: 04/05/2021 CLINICAL DATA:  Worsening shortness of breath and abdominal distension, initial encounter EXAM: CT ANGIOGRAPHY CHEST CT ABDOMEN AND PELVIS WITH CONTRAST TECHNIQUE: Multidetector CT imaging of the chest  was performed using the standard protocol during bolus administration of intravenous contrast. Multiplanar CT image reconstructions and MIPs were obtained to evaluate the vascular anatomy. Multidetector CT imaging of the abdomen and pelvis was performed using the standard protocol during bolus administration of intravenous contrast. CONTRAST:  9m OMNIPAQUE IOHEXOL 350 MG/ML SOLN COMPARISON:  Chest x-ray from earlier in the same day, 05/15/2020. FINDINGS: CTA CHEST FINDINGS Cardiovascular: Atherosclerotic calcifications of the thoracic aorta are noted. No aneurysmal dilatation or dissection is seen. Changes of prior coronary bypass grafting are noted. Heavy coronary calcifications are seen. Pulmonary artery shows a normal branching pattern bilaterally. No filling defect is identified to suggest pulmonary embolism. Mediastinum/Nodes: Thoracic inlet is within normal limits. No sizable hilar or mediastinal adenopathy is noted. The esophagus as visualized is within normal limits. Lungs/Pleura: Lungs are well aerated bilaterally. Diffuse emphysematous changes are seen. 5 mm nodule is noted in the posterior aspect of the right upper lobe best seen on image number 54 of series 8. Small pleural effusion is noted on the right. No focal infiltrate is seen. Musculoskeletal: Compression deformity at T8 is identified this is stable in appearance from prior exam of 2021 degenerative changes are seen. Findings of prior median sternotomy are noted. No rib abnormality is seen. Review of the MIP images confirms the above findings. CT ABDOMEN and PELVIS FINDINGS Hepatobiliary: No focal liver abnormality is seen. No gallstones, gallbladder wall thickening, or  biliary dilatation. Pancreas: Unremarkable. No pancreatic ductal dilatation or surrounding inflammatory changes. Spleen: Normal in size without focal abnormality. Adrenals/Urinary Tract: Adrenal glands are within normal limits. Tiny nonobstructing stone is noted in the upper pole of the left kidney best seen on image number 24 series 2. Diffuse renal vascular calcifications are noted bilaterally. No obstructive changes are seen. Bladder is within normal limits. Stomach/Bowel: The appendix is within normal limits. No obstructive or inflammatory changes of the colon are seen. Small bowel and stomach appear within normal limits. Vascular/Lymphatic: Aortic atherosclerosis. No enlarged abdominal or pelvic lymph nodes. Reproductive: Prostate is unremarkable. Other: No abdominal wall hernia or abnormality. No abdominopelvic ascites. Musculoskeletal: No acute or significant osseous findings. 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 Review of the MIP images confirms the above findings. IMPRESSION: CTA of the chest: No evidence of pulmonary emboli. 5 mm nodule in the posterior aspect of the right upper lobe. No follow-up needed if patient is low-risk. Non-contrast chest CT can be considered in 12 months if patient is high-risk. This recommendation follows the consensus statement: Guidelines for Management of Incidental Pulmonary Nodules Detected on CT Images: From the Fleischner Society 2017; Radiology 2017; 284:228-243. Chronic T8 compression deformity. CT of the abdomen and pelvis: Tiny nonobstructing left renal stone in the upper pole. Chronic changes at L5 with anterolisthesis. Electronically Signed   By: MInez CatalinaM.D.   On: 04/05/2021 20:43   DG Chest Portable 1 View  Result Date: 04/05/2021 CLINICAL DATA:  Shortness of breath.  Pain. EXAM: PORTABLE CHEST 1 VIEW COMPARISON:  Most recent radiograph 05/15/2020 FINDINGS: Post median sternotomy and CABG. Borderline cardiomegaly.  Unchanged mediastinal contours with aortic atherosclerosis. Chronic hyperinflation. Increased peribronchial thickening from prior exam. Linear opacity in the right mid lung may represent fluid in the fissure or atelectasis. No significant sub pulmonic effusion. No pneumothorax. Known left humeral shaft fracture is only partially included in the field of view. IMPRESSION: 1. Increased peribronchial thickening from prior exam, may be pulmonary edema or acute bronchitis superimposed on COPD. 2. Linear  opacity in the right mid lung may represent fluid in the fissure or atelectasis. Electronically Signed   By: Keith Rake M.D.   On: 04/05/2021 19:28   DG Humerus Left  Result Date: 03/31/2021 [facility] Radiology Report Dictated by Dr. [name] Chief complaint humerus fracture on January 7 Images: AP lateral left humerus Slightly above midshaft fracture left humerus which appears to have more angulation than it did last time However we do see good fracture callus around the fracture on all films indicating good progression towards healing   Micro Results   Recent Results (from the past 240 hour(s))  Resp Panel by RT-PCR (Flu A&B, Covid) Nasopharyngeal Swab     Status: None   Collection Time: 04/05/21  6:52 PM   Specimen: Nasopharyngeal Swab; Nasopharyngeal(NP) swabs in vial transport medium  Result Value Ref Range Status   SARS Coronavirus 2 by RT PCR NEGATIVE NEGATIVE Final    Comment: (NOTE) SARS-CoV-2 target nucleic acids are NOT DETECTED.  The SARS-CoV-2 RNA is generally detectable in upper respiratory specimens during the acute phase of infection. The lowest concentration of SARS-CoV-2 viral copies this assay can detect is 138 copies/mL. A negative result does not preclude SARS-Cov-2 infection and should not be used as the sole basis for treatment or other patient management decisions. A negative result may occur with  improper specimen collection/handling, submission of  specimen other than nasopharyngeal swab, presence of viral mutation(s) within the areas targeted by this assay, and inadequate number of viral copies(<138 copies/mL). A negative result must be combined with clinical observations, patient history, and epidemiological information. The expected result is Negative.  Fact Sheet for Patients:  EntrepreneurPulse.com.au  Fact Sheet for Healthcare Providers:  IncredibleEmployment.be  This test is no t yet approved or cleared by the [place] FDA and  has been authorized for detection and/or diagnosis of SARS-CoV-2 by FDA under an Emergency Use Authorization (EUA). This EUA will remain  in effect (meaning this test can be used) for the duration of the COVID-19 declaration under Section 564(b)(1) of the Act, 21 U.S.C.section 360bbb-3(b)(1), unless the authorization is terminated  or revoked sooner.       Influenza A by PCR NEGATIVE NEGATIVE Final   Influenza B by PCR NEGATIVE NEGATIVE Final    Comment: (NOTE) The Xpert Xpress SARS-CoV-2/FLU/RSV plus assay is intended as an aid in the diagnosis of influenza from Nasopharyngeal swab specimens and should not be used as a sole basis for treatment. Nasal washings and aspirates are unacceptable for Xpert Xpress SARS-CoV-2/FLU/RSV testing.  Fact Sheet for Patients: EntrepreneurPulse.com.au  Fact Sheet for Healthcare Providers: IncredibleEmployment.be  This test is not yet approved or cleared by the [place] FDA and has been authorized for detection and/or diagnosis of SARS-CoV-2 by FDA under an Emergency Use Authorization (EUA). This EUA will remain in effect (meaning this test can be used) for the duration of the COVID-19 declaration under Section 564(b)(1) of the Act, 21 U.S.C. section 360bbb-3(b)(1), unless the authorization is terminated or revoked.  Performed at [facility], [address],  [place], [place] [address]     Today   Subjective    [place] today has no new concerns No fever  Or chills  -Respiratory status has improved significantly oxygen requirement has improved   Patient has been seen and examined prior to discharge   Objective   Blood pressure (!) 110/59, pulse 93, temperature 97.9 F (36.6 C), temperature source Oral, resp. rate 20, SpO2 90 %.  No intake or output data in the 24 hours ending 04/08/21 1529  Exam Gen:- Awake Alert, no acute distress  HEENT:- [place].AT, No sclera icterus Nose-  2L/min Neck-Supple Neck,No JVD,.  Lungs-much improved air movement, no wheezing CV- S1, S2 normal, irregular Abd-  +ve B.Sounds, Abd Soft, No tenderness,    Extremity/Skin:- No  edema,   good pulses Psych-affect is appropriate, oriented x3 Neuro-no new focal deficits, no tremors    Data Review   CBC w Diff:  Lab Results  Component Value Date   WBC 4.6 04/06/2021   HGB 12.2 (L) 04/06/2021   HCT 39.3 04/06/2021   PLT 174 04/06/2021   LYMPHOPCT 9 04/06/2021   MONOPCT 0 04/06/2021   EOSPCT 0 04/06/2021   BASOPCT 0 04/06/2021    CMP:  Lab Results  Component Value Date   NA 139 04/08/2021   K 3.7 04/08/2021   CL 95 (L) 04/08/2021   CO2 40 (H) 04/08/2021   BUN 48 (H) 04/08/2021   CREATININE 1.92 (H) 04/08/2021   PROT 7.2 04/05/2021   ALBUMIN 3.0 (L) 04/08/2021   BILITOT 0.6 04/05/2021   ALKPHOS 67 04/05/2021   AST 14 (L) 04/05/2021   ALT 10 04/05/2021  .   Total Discharge time is about 33 minutes  Roxan Hockey M.D on 04/08/2021 at 3:29 PM  Go to [email or web address] -  for contact info  [facility] - Office  [phone]

## 2021-04-08 NOTE — Care Management Important Message (Signed)
Important Message  Patient Details  Name: Daniel Barker MRN: [id number] Date of Birth: 03/24/1946   Medicare Important Message Given:  Yes     [name] 04/08/2021, 11:08 AM

## 2021-04-08 NOTE — Discharge Instructions (Signed)
1)You are taking Apixaban/Eliquis and Plavix which are blood thinners so Please Avoid Avoid ibuprofen/Advil/Aleve/Motrin/Goody Powders/Naproxen/BC powders/Meloxicam/Diclofenac/Indomethacin and other Nonsteroidal anti-inflammatory medications as these will make you more likely to bleed and can cause stomach ulcers, can also cause Kidney problems.   2)You need oxygen at home at 2 L via nasal cannula continuously while asleep--- smoking or having open fires around oxygen can cause fire, significant injury and death  3)Very low-salt diet advised  4)Weigh yourself daily, call if you gain more than 3 pounds in 1 day or more than 5 pounds in 1 week as your diuretic medications may need to be adjusted  5)Limit your Fluid  intake to no more than 60 ounces (1.8 Liters) per day  6) complete abstinence from tobacco/smoking cessation strongly advised

## 2021-04-08 NOTE — Plan of Care (Signed)

## 2021-04-16 DIAGNOSIS — Z0001 Encounter for general adult medical examination with abnormal findings: Secondary | ICD-10-CM | POA: Diagnosis not present

## 2021-04-16 DIAGNOSIS — E782 Mixed hyperlipidemia: Secondary | ICD-10-CM | POA: Diagnosis not present

## 2021-04-16 DIAGNOSIS — I10 Essential (primary) hypertension: Secondary | ICD-10-CM | POA: Diagnosis not present

## 2021-04-16 DIAGNOSIS — N189 Chronic kidney disease, unspecified: Secondary | ICD-10-CM | POA: Diagnosis not present

## 2021-04-19 ENCOUNTER — Other Ambulatory Visit: Payer: Self-pay | Admitting: Internal Medicine

## 2021-04-20 DIAGNOSIS — J449 Chronic obstructive pulmonary disease, unspecified: Secondary | ICD-10-CM | POA: Diagnosis not present

## 2021-04-25 DIAGNOSIS — E559 Vitamin D deficiency, unspecified: Secondary | ICD-10-CM | POA: Insufficient documentation

## 2021-04-25 DIAGNOSIS — I482 Chronic atrial fibrillation, unspecified: Secondary | ICD-10-CM

## 2021-04-25 DIAGNOSIS — L0390 Cellulitis, unspecified: Secondary | ICD-10-CM | POA: Insufficient documentation

## 2021-04-25 DIAGNOSIS — R7301 Impaired fasting glucose: Secondary | ICD-10-CM | POA: Insufficient documentation

## 2021-04-25 DIAGNOSIS — E663 Overweight: Secondary | ICD-10-CM | POA: Insufficient documentation

## 2021-04-25 DIAGNOSIS — F17200 Nicotine dependence, unspecified, uncomplicated: Secondary | ICD-10-CM | POA: Insufficient documentation

## 2021-04-25 DIAGNOSIS — Z9861 Coronary angioplasty status: Secondary | ICD-10-CM | POA: Insufficient documentation

## 2021-04-25 DIAGNOSIS — R251 Tremor, unspecified: Secondary | ICD-10-CM | POA: Insufficient documentation

## 2021-04-25 DIAGNOSIS — R7303 Prediabetes: Secondary | ICD-10-CM | POA: Insufficient documentation

## 2021-04-25 DIAGNOSIS — N189 Chronic kidney disease, unspecified: Secondary | ICD-10-CM

## 2021-04-25 DIAGNOSIS — I480 Paroxysmal atrial fibrillation: Secondary | ICD-10-CM

## 2021-04-25 DIAGNOSIS — R609 Edema, unspecified: Secondary | ICD-10-CM | POA: Insufficient documentation

## 2021-04-25 DIAGNOSIS — J45909 Unspecified asthma, uncomplicated: Secondary | ICD-10-CM | POA: Diagnosis present

## 2021-04-27 DIAGNOSIS — Z131 Encounter for screening for diabetes mellitus: Secondary | ICD-10-CM | POA: Diagnosis not present

## 2021-04-27 DIAGNOSIS — N189 Chronic kidney disease, unspecified: Secondary | ICD-10-CM | POA: Diagnosis not present

## 2021-04-27 DIAGNOSIS — R7303 Prediabetes: Secondary | ICD-10-CM | POA: Diagnosis not present

## 2021-04-27 DIAGNOSIS — J449 Chronic obstructive pulmonary disease, unspecified: Secondary | ICD-10-CM | POA: Diagnosis not present

## 2021-04-27 DIAGNOSIS — R7301 Impaired fasting glucose: Secondary | ICD-10-CM | POA: Diagnosis not present

## 2021-04-27 DIAGNOSIS — E782 Mixed hyperlipidemia: Secondary | ICD-10-CM | POA: Diagnosis not present

## 2021-04-27 DIAGNOSIS — I4891 Unspecified atrial fibrillation: Secondary | ICD-10-CM | POA: Diagnosis not present

## 2021-04-27 DIAGNOSIS — I10 Essential (primary) hypertension: Secondary | ICD-10-CM | POA: Diagnosis not present

## 2021-04-27 DIAGNOSIS — E559 Vitamin D deficiency, unspecified: Secondary | ICD-10-CM | POA: Diagnosis not present

## 2021-05-04 ENCOUNTER — Other Ambulatory Visit: Payer: Self-pay | Admitting: Internal Medicine

## 2021-05-04 DIAGNOSIS — G250 Essential tremor: Secondary | ICD-10-CM | POA: Insufficient documentation

## 2021-05-04 DIAGNOSIS — J449 Chronic obstructive pulmonary disease, unspecified: Secondary | ICD-10-CM | POA: Diagnosis not present

## 2021-05-18 ENCOUNTER — Inpatient Hospital Stay (HOSPITAL_COMMUNITY)
Admission: EM | Admit: 2021-05-18 | Discharge: 2021-05-23 | DRG: 177 | Disposition: A | Discharge status: Home / Self Care | Payer: Medicare Other | Attending: Internal Medicine | Admitting: Internal Medicine

## 2021-05-18 ENCOUNTER — Other Ambulatory Visit: Payer: Self-pay

## 2021-05-18 ENCOUNTER — Encounter (HOSPITAL_COMMUNITY): Payer: Self-pay | Admitting: *Deleted

## 2021-05-18 ENCOUNTER — Emergency Department (HOSPITAL_COMMUNITY): Payer: Medicare Other

## 2021-05-18 DIAGNOSIS — J439 Emphysema, unspecified: Secondary | ICD-10-CM | POA: Diagnosis present

## 2021-05-18 DIAGNOSIS — R197 Diarrhea, unspecified: Secondary | ICD-10-CM | POA: Diagnosis not present

## 2021-05-18 DIAGNOSIS — Z832 Family history of diseases of the blood and blood-forming organs and certain disorders involving the immune mechanism: Secondary | ICD-10-CM

## 2021-05-18 DIAGNOSIS — E7800 Pure hypercholesterolemia, unspecified: Secondary | ICD-10-CM | POA: Diagnosis not present

## 2021-05-18 DIAGNOSIS — Z8249 Family history of ischemic heart disease and other diseases of the circulatory system: Secondary | ICD-10-CM

## 2021-05-18 DIAGNOSIS — N1831 Chronic kidney disease, stage 3a: Secondary | ICD-10-CM | POA: Diagnosis present

## 2021-05-18 DIAGNOSIS — Z825 Family history of asthma and other chronic lower respiratory diseases: Secondary | ICD-10-CM | POA: Diagnosis not present

## 2021-05-18 DIAGNOSIS — Z7951 Long term (current) use of inhaled steroids: Secondary | ICD-10-CM | POA: Diagnosis not present

## 2021-05-18 DIAGNOSIS — R0600 Dyspnea, unspecified: Secondary | ICD-10-CM

## 2021-05-18 DIAGNOSIS — Z955 Presence of coronary angioplasty implant and graft: Secondary | ICD-10-CM

## 2021-05-18 DIAGNOSIS — Z9981 Dependence on supplemental oxygen: Secondary | ICD-10-CM

## 2021-05-18 DIAGNOSIS — J9622 Acute and chronic respiratory failure with hypercapnia: Secondary | ICD-10-CM | POA: Diagnosis not present

## 2021-05-18 DIAGNOSIS — Z87891 Personal history of nicotine dependence: Secondary | ICD-10-CM

## 2021-05-18 DIAGNOSIS — J9621 Acute and chronic respiratory failure with hypoxia: Secondary | ICD-10-CM | POA: Diagnosis not present

## 2021-05-18 DIAGNOSIS — G2581 Restless legs syndrome: Secondary | ICD-10-CM | POA: Diagnosis not present

## 2021-05-18 DIAGNOSIS — N400 Enlarged prostate without lower urinary tract symptoms: Secondary | ICD-10-CM | POA: Diagnosis present

## 2021-05-18 DIAGNOSIS — I482 Chronic atrial fibrillation, unspecified: Secondary | ICD-10-CM | POA: Diagnosis present

## 2021-05-18 DIAGNOSIS — R0689 Other abnormalities of breathing: Secondary | ICD-10-CM | POA: Diagnosis present

## 2021-05-18 DIAGNOSIS — J441 Chronic obstructive pulmonary disease with (acute) exacerbation: Secondary | ICD-10-CM | POA: Diagnosis not present

## 2021-05-18 DIAGNOSIS — U071 COVID-19: Principal | ICD-10-CM | POA: Diagnosis present

## 2021-05-18 DIAGNOSIS — Z7902 Long term (current) use of antithrombotics/antiplatelets: Secondary | ICD-10-CM | POA: Diagnosis not present

## 2021-05-18 DIAGNOSIS — N1832 Chronic kidney disease, stage 3b: Secondary | ICD-10-CM | POA: Diagnosis not present

## 2021-05-18 DIAGNOSIS — J449 Chronic obstructive pulmonary disease, unspecified: Secondary | ICD-10-CM | POA: Diagnosis not present

## 2021-05-18 DIAGNOSIS — E871 Hypo-osmolality and hyponatremia: Secondary | ICD-10-CM | POA: Diagnosis present

## 2021-05-18 DIAGNOSIS — Z951 Presence of aortocoronary bypass graft: Secondary | ICD-10-CM

## 2021-05-18 DIAGNOSIS — Z79899 Other long term (current) drug therapy: Secondary | ICD-10-CM | POA: Diagnosis not present

## 2021-05-18 DIAGNOSIS — J1282 Pneumonia due to coronavirus disease 2019: Secondary | ICD-10-CM

## 2021-05-18 DIAGNOSIS — I480 Paroxysmal atrial fibrillation: Secondary | ICD-10-CM

## 2021-05-18 DIAGNOSIS — R112 Nausea with vomiting, unspecified: Secondary | ICD-10-CM | POA: Diagnosis not present

## 2021-05-18 DIAGNOSIS — Z888 Allergy status to other drugs, medicaments and biological substances status: Secondary | ICD-10-CM

## 2021-05-18 DIAGNOSIS — R0602 Shortness of breath: Secondary | ICD-10-CM | POA: Diagnosis not present

## 2021-05-18 DIAGNOSIS — G4733 Obstructive sleep apnea (adult) (pediatric): Secondary | ICD-10-CM | POA: Diagnosis not present

## 2021-05-18 DIAGNOSIS — N401 Enlarged prostate with lower urinary tract symptoms: Secondary | ICD-10-CM

## 2021-05-18 DIAGNOSIS — J9611 Chronic respiratory failure with hypoxia: Secondary | ICD-10-CM

## 2021-05-18 DIAGNOSIS — I2510 Atherosclerotic heart disease of native coronary artery without angina pectoris: Secondary | ICD-10-CM | POA: Diagnosis present

## 2021-05-18 DIAGNOSIS — I129 Hypertensive chronic kidney disease with stage 1 through stage 4 chronic kidney disease, or unspecified chronic kidney disease: Secondary | ICD-10-CM | POA: Diagnosis present

## 2021-05-18 DIAGNOSIS — N189 Chronic kidney disease, unspecified: Secondary | ICD-10-CM

## 2021-05-18 LAB — COMPREHENSIVE METABOLIC PANEL
ALT: 13 U/L (ref 0–44)
AST: 20 U/L (ref 15–41)
Albumin: 3.9 g/dL (ref 3.5–5.0)
Alkaline Phosphatase: 68 U/L (ref 38–126)
Anion gap: 8 (ref 5–15)
BUN: 15 mg/dL (ref 8–23)
CO2: 32 mmol/L (ref 22–32)
Calcium: 9.3 mg/dL (ref 8.9–10.3)
Chloride: 93 mmol/L — ABNORMAL LOW (ref 98–111)
Creatinine, Ser: 1.27 mg/dL — ABNORMAL HIGH (ref 0.61–1.24)
GFR, Estimated: 59 mL/min — ABNORMAL LOW (ref >60)
Glucose, Bld: 96 mg/dL (ref 70–99)
Potassium: 4.2 mmol/L (ref 3.5–5.1)
Sodium: 133 mmol/L — ABNORMAL LOW (ref 135–145)
Total Bilirubin: 0.9 mg/dL (ref 0.3–1.2)
Total Protein: 6.8 g/dL (ref 6.5–8.1)

## 2021-05-18 LAB — CBC WITH DIFFERENTIAL/PLATELET
Abs Immature Granulocytes: 0.04 10*3/uL (ref 0.00–0.07)
Basophils Absolute: 0.1 10*3/uL (ref 0.0–0.1)
Basophils Relative: 1 %
Eosinophils Absolute: 0 10*3/uL (ref 0.0–0.5)
Eosinophils Relative: 0 %
HCT: 42.3 % (ref 39.0–52.0)
Hemoglobin: 13.5 g/dL (ref 13.0–17.0)
Immature Granulocytes: 0 %
Lymphocytes Relative: 7 %
Lymphs Abs: 0.6 10*3/uL — ABNORMAL LOW (ref 0.7–4.0)
MCH: 31.2 pg (ref 26.0–34.0)
MCHC: 31.9 g/dL (ref 30.0–36.0)
MCV: 97.7 fL (ref 80.0–100.0)
Monocytes Absolute: 1.1 10*3/uL — ABNORMAL HIGH (ref 0.1–1.0)
Monocytes Relative: 11 %
Neutro Abs: 7.8 10*3/uL — ABNORMAL HIGH (ref 1.7–7.7)
Neutrophils Relative %: 81 %
Platelets: 165 10*3/uL (ref 150–400)
RBC: 4.33 MIL/uL (ref 4.22–5.81)
RDW: 12.9 % (ref 11.5–15.5)
WBC: 9.7 10*3/uL (ref 4.0–10.5)
nRBC: 0 % (ref 0.0–0.2)

## 2021-05-18 LAB — LIPASE, BLOOD: Lipase: 31 U/L (ref 11–51)

## 2021-05-18 LAB — BLOOD GAS, VENOUS
Acid-Base Excess: 8.7 mmol/L — ABNORMAL HIGH (ref 0.0–2.0)
Bicarbonate: 28.4 mmol/L — ABNORMAL HIGH (ref 20.0–28.0)
FIO2: 28
O2 Saturation: 23 %
Patient temperature: 36.9
pCO2, Ven: 72.8 mmHg (ref 44.0–60.0)
pH, Ven: 7.303 (ref 7.250–7.430)
pO2, Ven: <31 mmHg — CL (ref 32.0–45.0)

## 2021-05-18 LAB — RESP PANEL BY RT-PCR (FLU A&B, COVID) ARPGX2
Influenza A by PCR: NEGATIVE
Influenza B by PCR: NEGATIVE
SARS Coronavirus 2 by RT PCR: POSITIVE — AB

## 2021-05-18 LAB — BRAIN NATRIURETIC PEPTIDE: B Natriuretic Peptide: 241 pg/mL — ABNORMAL HIGH (ref 0.0–100.0)

## 2021-05-18 MED ORDER — ALBUTEROL SULFATE (2.5 MG/3ML) 0.083% IN NEBU
5.0000 mg | INHALATION_SOLUTION | Freq: Once | RESPIRATORY_TRACT | Status: AC
  Administered 2021-05-18: 5 mg via RESPIRATORY_TRACT
  Filled 2021-05-18: qty 6

## 2021-05-18 MED ORDER — SODIUM CHLORIDE 0.9 % IV SOLN
100.0000 mg | Freq: Every day | INTRAVENOUS | Status: AC
  Administered 2021-05-19 – 2021-05-22 (×4): 100 mg via INTRAVENOUS
  Filled 2021-05-18 (×4): qty 20

## 2021-05-18 MED ORDER — IPRATROPIUM BROMIDE 0.02 % IN SOLN
0.5000 mg | Freq: Once | RESPIRATORY_TRACT | Status: AC
  Administered 2021-05-18: 0.5 mg via RESPIRATORY_TRACT
  Filled 2021-05-18: qty 2.5

## 2021-05-18 MED ORDER — ALBUTEROL SULFATE HFA 108 (90 BASE) MCG/ACT IN AERS
4.0000 | INHALATION_SPRAY | RESPIRATORY_TRACT | Status: DC | PRN
  Administered 2021-05-19: 4 via RESPIRATORY_TRACT
  Filled 2021-05-18: qty 6.7

## 2021-05-18 MED ORDER — ALBUTEROL (5 MG/ML) CONTINUOUS INHALATION SOLN
20.0000 mg/h | INHALATION_SOLUTION | Freq: Once | RESPIRATORY_TRACT | Status: DC
  Filled 2021-05-18: qty 20

## 2021-05-18 MED ORDER — ENOXAPARIN SODIUM 40 MG/0.4ML IJ SOSY
40.0000 mg | PREFILLED_SYRINGE | INTRAMUSCULAR | Status: DC
  Administered 2021-05-19: 40 mg via SUBCUTANEOUS
  Filled 2021-05-18: qty 0.4

## 2021-05-18 MED ORDER — METHYLPREDNISOLONE SODIUM SUCC 125 MG IJ SOLR
125.0000 mg | Freq: Once | INTRAMUSCULAR | Status: AC
  Administered 2021-05-18: 125 mg via INTRAVENOUS
  Filled 2021-05-18: qty 2

## 2021-05-18 MED ORDER — SODIUM CHLORIDE 0.9 % IV SOLN
100.0000 mg | INTRAVENOUS | Status: AC
  Administered 2021-05-18 (×2): 100 mg via INTRAVENOUS
  Filled 2021-05-18 (×2): qty 20

## 2021-05-18 NOTE — ED Notes (Signed)
Patient 77% on 2L/[place] when standing and transferring self to bed with pursed lip breahting. Patient hyperoxygenated to 4L/[place] with saturation going to 96%. Patient then changed to 2L/[place] and sats 92% when relaxed and breathing through nose.

## 2021-05-18 NOTE — ED Notes (Signed)
Pt belongings on counter.

## 2021-05-18 NOTE — ED Triage Notes (Signed)
Pt with sob since yesterday, productive cough -yellow in color.  Also c/o abd. Pain, denies any N/V/D.

## 2021-05-18 NOTE — ED Provider Notes (Signed)
[facility] EMERGENCY DEPARTMENT Provider Note   CSN: [id number] Arrival date & time: 05/18/21  1444     History Chief Complaint  Patient presents with   Shortness of Breath    Daniel Barker is a 75 y.o. male.  Patient with history of COPD on 2 L home oxygen, atrial fibrillation, coronary artery disease, high blood pressure, CAD, respiratory failure with hypercapnia presents with worsening shortness of breath since yesterday.  Patient also has noticed mild productive cough as well.  No significant sick contacts.  No fevers, mild chills.  Patient had increased to 4 L on his machine.  No chest pain or leg swelling.  No known heart failure history.  Patient also has mild abdominal tightness no focal pain.  Nausea without vomiting.  No blood in the stools passing gas.      Past Medical History:  Diagnosis Date   Asthma    Atrial fibrillation (HCC)    BPH (benign prostatic hyperplasia)    Cigarette smoker    CKD (chronic kidney disease)    COPD (chronic obstructive pulmonary disease) (HCC)    Coronary artery disease    Cough    High cholesterol    Hx of CABG    Hypercholesteremia    Hypertension    Localized edema    Stented coronary artery     Patient Active Problem List   Diagnosis Date Noted   Acute exacerbation of chronic obstructive pulmonary disease (COPD) ([place]) 04/05/2021   Closed displaced spiral fracture of shaft of left humerus 12/23/2020   Chronic respiratory failure with hypoxia and hypercapnia ([place]) 09/02/2020   Urinary retention 07/08/2020   Benign prostatic hyperplasia with urinary obstruction 07/08/2020   COPD GOLD IV / group D  05/28/2020   Cigarette smoker 05/28/2020   AKI (acute kidney injury) ([place]) 06/03/2014   Anemia 06/03/2014   CAD (coronary atherosclerotic disease) 06/03/2014   Acute respiratory failure with hypercapnia ([place]) 06/01/2014   Diabetes mellitus ([place]) 06/01/2014   Hypertension 06/01/2014    Past Surgical History:  Procedure  Laterality Date   CARDIAC SURGERY     CORONARY ARTERY BYPASS GRAFT         Family History  Problem Relation Age of Onset   Hypertension Mother    Clotting disorder Mother    COPD Father     Social History   Tobacco Use   Smoking status: Former    Packs/day: 1.00    Years: 60.00    Pack years: 60.00    Types: Cigarettes    Quit date: 09/01/2020    Years since quitting: 0.7   Smokeless tobacco: Never  Vaping Use   Vaping Use: Never used  Substance Use Topics   Alcohol use: Never   Drug use: Never    Home Medications Prior to Admission medications   Medication Sig Start Date End Date Taking? Authorizing Provider  acetaminophen (TYLENOL) 500 MG tablet Take 1,000 mg by mouth every 6 (six) hours as needed.   Yes [provider]  albuterol (PROVENTIL) (2.5 MG/3ML) 0.083% nebulizer solution INHALE THE CONTENTS OF 1 VIAL VIA NEBULIZER EVERY 6 HOURS AS NEEDED FOR WHEEZING OR SHORTNESS OF BREATH 04/19/21  Yes Tanda Rockers, MD  albuterol (VENTOLIN HFA) 108 (90 Base) MCG/ACT inhaler USE 2 PUFFS BY MOUTH EVERY 4 HOURS AS NEEDED 05/04/21  Yes Tanda Rockers, MD  budesonide-formoterol (SYMBICORT) 160-4.5 MCG/ACT inhaler Take 2 puffs first thing in am and then another 2 puffs about 12 hours later. 10/27/20  Yes Tanda Rockers, MD  clopidogrel (PLAVIX) 75 MG tablet Take 75 mg by mouth daily.   Yes [provider]  fenofibrate micronized (LOFIBRA) 134 MG capsule Take 134 mg by mouth daily before breakfast.   Yes [provider]  montelukast (SINGULAIR) 10 MG tablet Take 10 mg by mouth at bedtime.   Yes [provider]  pantoprazole (PROTONIX) 40 MG tablet Take 1 tablet (40 mg total) by mouth daily. 04/09/21  Yes Emokpae, Courage, MD  propafenone (RYTHMOL) 225 MG tablet Take 1 tablet (225 mg total) by mouth every 8 (eight) hours. 02/03/21  Yes Josue Hector, MD  propranolol (INDERAL) 40 MG tablet Take 40 mg by mouth 2 (two) times daily. 05/05/21  Yes  [provider]  tamsulosin (FLOMAX) 0.4 MG CAPS capsule Take 1 capsule (0.4 mg total) by mouth daily. 07/08/20  Yes McKenzie, Candee Furbish, MD  torsemide (DEMADEX) 100 MG tablet Take 0.5 tablets (50 mg total) by mouth daily. 04/08/21  Yes Emokpae, Courage, MD  varenicline (CHANTIX STARTING MONTH PAK) 0.5 MG X 11 & 1 MG X 42 tablet Take one 0.5 mg tablet by mouth once daily for 3 days, then increase to one 0.5 mg tablet twice daily for 4 days, then increase to one 1 mg tablet twice daily. Patient not taking: No sig reported 04/08/21   Roxan Hockey, MD    Allergies    Lipitor [atorvastatin]  Review of Systems   Review of Systems  Constitutional:  Positive for fatigue. Negative for chills and fever.  HENT:  Negative for congestion.   Eyes:  Negative for visual disturbance.  Respiratory:  Positive for cough and shortness of breath.   Cardiovascular:  Negative for chest pain.  Gastrointestinal:  Negative for abdominal pain and vomiting.  Genitourinary:  Negative for dysuria and flank pain.  Musculoskeletal:  Negative for back pain, neck pain and neck stiffness.  Skin:  Negative for rash.  Neurological:  Negative for light-headedness and headaches.   Physical Exam Updated Vital Signs BP 98/64   Pulse 87   Temp 98.4 F (36.9 C) (Oral)   Resp 18   Ht '5\' 7"'$  (1.702 m)   Wt 81 kg   SpO2 97%   BMI 27.97 kg/m   Physical Exam Vitals and nursing note reviewed.  Constitutional:      General: He is not in acute distress.    Appearance: He is well-developed.  HENT:     Head: Normocephalic and atraumatic.     Mouth/Throat:     Mouth: Mucous membranes are dry.  Eyes:     General:        Right eye: No discharge.        Left eye: No discharge.     Conjunctiva/sclera: Conjunctivae normal.  Neck:     Trachea: No tracheal deviation.  Cardiovascular:     Rate and Rhythm: Normal rate and regular rhythm.     Heart sounds: No murmur heard. Pulmonary:     Effort: Pulmonary effort is  normal.     Breath sounds: Examination of the right-upper field reveals decreased breath sounds and wheezing. Examination of the left-upper field reveals decreased breath sounds and wheezing. Examination of the right-middle field reveals decreased breath sounds. Examination of the left-middle field reveals decreased breath sounds. Examination of the right-lower field reveals decreased breath sounds and rhonchi. Examination of the left-lower field reveals decreased breath sounds and rhonchi. Decreased breath sounds, wheezing and rhonchi present.  Abdominal:  General: There is no distension.     Palpations: Abdomen is soft.     Tenderness: There is no abdominal tenderness. There is no guarding.  Musculoskeletal:        General: Normal range of motion.     Cervical back: Normal range of motion and neck supple. No rigidity.     Right lower leg: No edema.     Left lower leg: No edema.  Skin:    General: Skin is warm.     Capillary Refill: Capillary refill takes less than 2 seconds.     Findings: No rash.  Neurological:     General: No focal deficit present.     Mental Status: He is alert and oriented to person, place, and time.     Cranial Nerves: No cranial nerve deficit.  Psychiatric:        Mood and Affect: Mood is anxious.    ED Results / Procedures / Treatments   Labs (all labs ordered are listed, but only abnormal results are displayed) Labs Reviewed  RESP PANEL BY RT-PCR (FLU A&B, COVID) ARPGX2 - Abnormal; Notable for the following components:      Result Value   SARS Coronavirus 2 by RT PCR POSITIVE (*)    All other components within normal limits  COMPREHENSIVE METABOLIC PANEL - Abnormal; Notable for the following components:   Sodium 133 (*)    Chloride 93 (*)    Creatinine, Ser 1.27 (*)    GFR, Estimated 59 (*)    All other components within normal limits  CBC WITH DIFFERENTIAL/PLATELET - Abnormal; Notable for the following components:   Neutro Abs 7.8 (*)    Lymphs  Abs 0.6 (*)    Monocytes Absolute 1.1 (*)    All other components within normal limits  BLOOD GAS, VENOUS - Abnormal; Notable for the following components:   pCO2, Ven 72.8 (*)    pO2, Ven <31.0 (*)    Bicarbonate 28.4 (*)    Acid-Base Excess 8.7 (*)    All other components within normal limits  BRAIN NATRIURETIC PEPTIDE - Abnormal; Notable for the following components:   B Natriuretic Peptide 241.0 (*)    All other components within normal limits  LIPASE, BLOOD    EKG EKG Interpretation  Date/Time:  Tuesday May 18 2021 15:43:31 EDT Ventricular Rate:  87 PR Interval:  154 QRS Duration: 123 QT Interval:  365 QTC Calculation: 440 R Axis:   -10 Text Interpretation: Ectopic atrial rhythm Atrial premature complexes in couplets Nonspecific intraventricular conduction delay Anteroseptal infarct, old Confirmed by Elnora Morrison [phone]) on 05/18/2021 5:39:18 PM  Radiology DG Chest Portable 1 View  Result Date: 05/18/2021 CLINICAL DATA:  Shortness of breath EXAM: PORTABLE CHEST 1 VIEW COMPARISON:  04/05/2021, CT 04/05/2021 FINDINGS: Post sternotomy changes. Emphysematous disease and mild bronchitic change. No focal opacity or pleural effusion. Stable cardiomediastinal silhouette with aortic atherosclerosis. No pneumothorax. IMPRESSION: No active disease.  Emphysema and mild bronchitic changes Electronically Signed   By: Donavan Foil M.D.   On: 05/18/2021 16:03    Procedures Procedures   Medications Ordered in ED Medications  albuterol (VENTOLIN HFA) 108 (90 Base) MCG/ACT inhaler 4 puff (has no administration in time range)  albuterol (PROVENTIL) (2.5 MG/3ML) 0.083% nebulizer solution 5 mg (5 mg Nebulization Given 05/18/21 1618)  ipratropium (ATROVENT) nebulizer solution 0.5 mg (0.5 mg Nebulization Given 05/18/21 1618)  albuterol (PROVENTIL) (2.5 MG/3ML) 0.083% nebulizer solution 5 mg (5 mg Nebulization Given 05/18/21 1618)  ipratropium (ATROVENT)  nebulizer solution 0.5 mg (0.5 mg  Nebulization Given 05/18/21 1617)  methylPREDNISolone sodium succinate (SOLU-MEDROL) 125 mg/2 mL injection 125 mg (125 mg Intravenous Given 05/18/21 1559)    ED Course  I have reviewed the triage vital signs and the nursing notes.  Pertinent labs & imaging results that were available during my care of the patient were reviewed by me and considered in my medical decision making (see chart for details).    MDM Rules/Calculators/A&P                          Patient presents with history of COPD normally on 2 L oxygen with significant increased work of breathing over the past 24 hours.  Clinical concern for infection which is tipped his breathing over requiring 3 to 4 L.  Patient has increased work of breathing and suprasternal retractions at rest.  Plan for chest x-ray to look for signs of infiltrate or fluid, general blood work, BNP for any signs of new heart failure and likely admission.  Albuterol and ipratropium ordered for nebulizers.  Viral testing sent.  COVID test returned positive.  Unable to do continuous nebs however repeat nebulizers ordered.  Patient requiring 3 to 4 L nasal cannula.  Blood work reviewed showing sodium 133, creatinine 1.27 stable from previous, PCO2 73 not currently encephalopathic, BNP 241.  Repaged hospitalist for admission to telemetry.  EKG reviewed no acute ST elevation, patient's not having any chest pain.  Reviewed.  Daniel Barker was evaluated in Emergency Department on 05/18/2021 for the symptoms described in the history of present illness. He was evaluated in the context of the global COVID-19 pandemic, which necessitated consideration that the patient might be at risk for infection with the SARS-CoV-2 virus that causes COVID-19. Institutional protocols and algorithms that pertain to the evaluation of patients at risk for COVID-19 are in a state of rapid change based on information released by regulatory bodies including the CDC and federal and state  organizations. These policies and algorithms were followed during the patient's care in the ED.  Final Clinical Impression(s) / ED Diagnoses Final diagnoses:  Acute exacerbation of chronic obstructive pulmonary disease (COPD) ([place])  Acute dyspnea  Hypercapnia  Pneumonia due to COVID-19 virus    Rx / DC Orders ED Discharge Orders     None        Elnora Morrison, MD 05/18/21 2040

## 2021-05-19 DIAGNOSIS — J9622 Acute and chronic respiratory failure with hypercapnia: Secondary | ICD-10-CM

## 2021-05-19 DIAGNOSIS — N189 Chronic kidney disease, unspecified: Secondary | ICD-10-CM

## 2021-05-19 DIAGNOSIS — U071 COVID-19: Principal | ICD-10-CM

## 2021-05-19 DIAGNOSIS — J9621 Acute and chronic respiratory failure with hypoxia: Secondary | ICD-10-CM

## 2021-05-19 DIAGNOSIS — J441 Chronic obstructive pulmonary disease with (acute) exacerbation: Secondary | ICD-10-CM

## 2021-05-19 LAB — COMPREHENSIVE METABOLIC PANEL
ALT: 12 U/L (ref 0–44)
AST: 18 U/L (ref 15–41)
Albumin: 3.4 g/dL — ABNORMAL LOW (ref 3.5–5.0)
Alkaline Phosphatase: 62 U/L (ref 38–126)
Anion gap: 7 (ref 5–15)
BUN: 18 mg/dL (ref 8–23)
CO2: 31 mmol/L (ref 22–32)
Calcium: 9 mg/dL (ref 8.9–10.3)
Chloride: 96 mmol/L — ABNORMAL LOW (ref 98–111)
Creatinine, Ser: 1.35 mg/dL — ABNORMAL HIGH (ref 0.61–1.24)
GFR, Estimated: 55 mL/min — ABNORMAL LOW (ref >60)
Glucose, Bld: 132 mg/dL — ABNORMAL HIGH (ref 70–99)
Potassium: 5 mmol/L (ref 3.5–5.1)
Sodium: 134 mmol/L — ABNORMAL LOW (ref 135–145)
Total Bilirubin: 0.8 mg/dL (ref 0.3–1.2)
Total Protein: 6 g/dL — ABNORMAL LOW (ref 6.5–8.1)

## 2021-05-19 LAB — PROTIME-INR
INR: 1 (ref 0.8–1.2)
Prothrombin Time: 13.5 seconds (ref 11.4–15.2)

## 2021-05-19 LAB — CBC
HCT: 39.6 % (ref 39.0–52.0)
Hemoglobin: 12.6 g/dL — ABNORMAL LOW (ref 13.0–17.0)
MCH: 31 pg (ref 26.0–34.0)
MCHC: 31.8 g/dL (ref 30.0–36.0)
MCV: 97.5 fL (ref 80.0–100.0)
Platelets: 154 10*3/uL (ref 150–400)
RBC: 4.06 MIL/uL — ABNORMAL LOW (ref 4.22–5.81)
RDW: 12.9 % (ref 11.5–15.5)
WBC: 3.1 10*3/uL — ABNORMAL LOW (ref 4.0–10.5)
nRBC: 0 % (ref 0.0–0.2)

## 2021-05-19 LAB — APTT: aPTT: 34 seconds (ref 24–36)

## 2021-05-19 LAB — MAGNESIUM: Magnesium: 1.8 mg/dL (ref 1.7–2.4)

## 2021-05-19 LAB — PHOSPHORUS: Phosphorus: 4.1 mg/dL (ref 2.5–4.6)

## 2021-05-19 MED ORDER — PANTOPRAZOLE SODIUM 40 MG PO TBEC
40.0000 mg | DELAYED_RELEASE_TABLET | Freq: Every day | ORAL | Status: DC
  Administered 2021-05-19 – 2021-05-22 (×4): 40 mg via ORAL
  Filled 2021-05-19 (×4): qty 1

## 2021-05-19 MED ORDER — METHYLPREDNISOLONE SODIUM SUCC 40 MG IJ SOLR
40.0000 mg | Freq: Three times a day (TID) | INTRAMUSCULAR | Status: DC
  Administered 2021-05-19 – 2021-05-23 (×13): 40 mg via INTRAVENOUS
  Filled 2021-05-19 (×15): qty 1

## 2021-05-19 MED ORDER — MONTELUKAST SODIUM 10 MG PO TABS
10.0000 mg | ORAL_TABLET | Freq: Every day | ORAL | Status: DC
  Administered 2021-05-19 – 2021-05-22 (×4): 10 mg via ORAL
  Filled 2021-05-19 (×4): qty 1

## 2021-05-19 MED ORDER — TAMSULOSIN HCL 0.4 MG PO CAPS
0.4000 mg | ORAL_CAPSULE | Freq: Every day | ORAL | Status: DC
  Administered 2021-05-19 – 2021-05-23 (×5): 0.4 mg via ORAL
  Filled 2021-05-19 (×5): qty 1

## 2021-05-19 MED ORDER — ALBUTEROL SULFATE HFA 108 (90 BASE) MCG/ACT IN AERS
2.0000 | INHALATION_SPRAY | Freq: Four times a day (QID) | RESPIRATORY_TRACT | Status: DC
  Administered 2021-05-19: 2 via RESPIRATORY_TRACT

## 2021-05-19 MED ORDER — AZITHROMYCIN 250 MG PO TABS
250.0000 mg | ORAL_TABLET | Freq: Every day | ORAL | Status: AC
  Administered 2021-05-20 – 2021-05-23 (×4): 250 mg via ORAL
  Filled 2021-05-19 (×4): qty 1

## 2021-05-19 MED ORDER — IPRATROPIUM BROMIDE HFA 17 MCG/ACT IN AERS
2.0000 | INHALATION_SPRAY | Freq: Four times a day (QID) | RESPIRATORY_TRACT | Status: DC
  Administered 2021-05-19: 2 via RESPIRATORY_TRACT
  Filled 2021-05-19: qty 12.9

## 2021-05-19 MED ORDER — CLOPIDOGREL BISULFATE 75 MG PO TABS
75.0000 mg | ORAL_TABLET | Freq: Every day | ORAL | Status: DC
  Administered 2021-05-19 – 2021-05-23 (×5): 75 mg via ORAL
  Filled 2021-05-19 (×5): qty 1

## 2021-05-19 MED ORDER — ENOXAPARIN SODIUM 40 MG/0.4ML IJ SOSY
40.0000 mg | PREFILLED_SYRINGE | INTRAMUSCULAR | Status: DC
  Administered 2021-05-19 – 2021-05-22 (×4): 40 mg via SUBCUTANEOUS
  Filled 2021-05-19 (×4): qty 0.4

## 2021-05-19 MED ORDER — TORSEMIDE 20 MG PO TABS
50.0000 mg | ORAL_TABLET | Freq: Every day | ORAL | Status: DC
  Administered 2021-05-19 – 2021-05-22 (×4): 50 mg via ORAL
  Filled 2021-05-19 (×4): qty 3

## 2021-05-19 MED ORDER — ALBUTEROL SULFATE (2.5 MG/3ML) 0.083% IN NEBU: 2.5000 mg | INHALATION_SOLUTION | RESPIRATORY_TRACT | Status: DC | PRN

## 2021-05-19 MED ORDER — METHYLPREDNISOLONE SODIUM SUCC 40 MG IJ SOLR: 40.0000 mg | Freq: Two times a day (BID) | INTRAMUSCULAR | Status: DC

## 2021-05-19 MED ORDER — GUAIFENESIN-DM 100-10 MG/5ML PO SYRP: 5.0000 mL | ORAL_SOLUTION | ORAL | Status: DC | PRN

## 2021-05-19 MED ORDER — ACETAMINOPHEN 325 MG PO TABS: 650.0000 mg | ORAL_TABLET | Freq: Four times a day (QID) | ORAL | Status: DC | PRN

## 2021-05-19 MED ORDER — ASCORBIC ACID 500 MG PO TABS
500.0000 mg | ORAL_TABLET | Freq: Every day | ORAL | Status: DC
  Administered 2021-05-19 – 2021-05-23 (×5): 500 mg via ORAL
  Filled 2021-05-19 (×5): qty 1

## 2021-05-19 MED ORDER — MOMETASONE FURO-FORMOTEROL FUM 200-5 MCG/ACT IN AERO
2.0000 | INHALATION_SPRAY | Freq: Two times a day (BID) | RESPIRATORY_TRACT | Status: DC
  Administered 2021-05-19 – 2021-05-23 (×8): 2 via RESPIRATORY_TRACT
  Filled 2021-05-19: qty 8.8

## 2021-05-19 MED ORDER — ZINC SULFATE 220 (50 ZN) MG PO CAPS
220.0000 mg | ORAL_CAPSULE | Freq: Every day | ORAL | Status: DC
  Administered 2021-05-19 – 2021-05-23 (×5): 220 mg via ORAL
  Filled 2021-05-19 (×5): qty 1

## 2021-05-19 MED ORDER — PROPAFENONE HCL 225 MG PO TABS
225.0000 mg | ORAL_TABLET | Freq: Three times a day (TID) | ORAL | Status: DC
  Administered 2021-05-19 – 2021-05-23 (×14): 225 mg via ORAL
  Filled 2021-05-19 (×18): qty 1

## 2021-05-19 MED ORDER — DM-GUAIFENESIN ER 30-600 MG PO TB12
1.0000 | ORAL_TABLET | Freq: Two times a day (BID) | ORAL | Status: DC
  Administered 2021-05-19 – 2021-05-23 (×9): 1 via ORAL
  Filled 2021-05-19 (×9): qty 1

## 2021-05-19 MED ORDER — AZITHROMYCIN 250 MG PO TABS
500.0000 mg | ORAL_TABLET | Freq: Every day | ORAL | Status: AC
  Administered 2021-05-19: 500 mg via ORAL
  Filled 2021-05-19: qty 2

## 2021-05-19 MED ORDER — IPRATROPIUM-ALBUTEROL 20-100 MCG/ACT IN AERS
1.0000 | INHALATION_SPRAY | Freq: Four times a day (QID) | RESPIRATORY_TRACT | Status: DC
  Administered 2021-05-19 – 2021-05-20 (×7): 1 via RESPIRATORY_TRACT
  Filled 2021-05-19: qty 4

## 2021-05-19 NOTE — H&P (Signed)
History and Physical  Daniel Barker [id number] DOB: 17-Nov-1946 DOA: 05/18/2021  Referring physician: Elnora Morrison, MD PCP: [name], MD  Patient coming from: Home  Chief Complaint: Shortness of breath  HPI: Daniel Barker is a 75 y.o. male with medical history significant for  asthma, COPD, chronic hypoxic respiratory failure on 2 L of oxygen,, chronic atrial fibrillation, BPH, CKD stage IIIa, CAD who presents to the emergency department due to 1 day onset of worsening shortness of breath.  He complained of mild productive cough which was clear in nature.  Patient has had to increase O2 sat of 4 LPM due to increasing work of breathing.  He denies fever, chest pain, leg swelling, nausea or vomiting  ED Course:  In the emergency department, he was hemodynamically stable, though intermittently tachypneic.  Work-up in the ED showed normal CBC, mild hyponatremia, ABG showed hypoxia and hypercapnia.  Influenza A and B was negative.  SARS coronavirus 2 was positive. Chest x-ray showed no active disease, but showed emphysema and mild bronchitis changes. Breathing treatment was provided, Solu-Medrol was given, patient was started on IV remdesivir.  Hospitalist was asked to admit patient for further evaluation and management.   Review of Systems: Constitutional: Positive for fatigue negative for chills and fever.  HENT: Negative for ear pain and sore throat.   Eyes: Negative for pain and visual disturbance.  Respiratory:  for cough and shortness of breath.   Cardiovascular: Negative for chest pain and palpitations.  Gastrointestinal: Negative for abdominal pain and vomiting.  Endocrine: Negative for polyphagia and polyuria.  Genitourinary: Negative for decreased urine volume, dysuria, enuresis Musculoskeletal: Negative for arthralgias and back pain.  Skin: Negative for color change and rash.  Allergic/Immunologic: Negative for immunocompromised state.  Neurological: Negative for  tremors, syncope, speech difficulty, weakness, light-headedness and headaches.  Hematological: Does not bruise/bleed easily.  All other systems reviewed and are negative   Past Medical History:  Diagnosis Date   Asthma    Atrial fibrillation (HCC)    BPH (benign prostatic hyperplasia)    Cigarette smoker    CKD (chronic kidney disease)    COPD (chronic obstructive pulmonary disease) (HCC)    Coronary artery disease    Cough    High cholesterol    Hx of CABG    Hypercholesteremia    Hypertension    Localized edema    Stented coronary artery    Past Surgical History:  Procedure Laterality Date   CARDIAC SURGERY     CORONARY ARTERY BYPASS GRAFT      Social History:  reports that he quit smoking about 8 months ago. His smoking use included cigarettes. He has a 60.00 pack-year smoking history. He has never used smokeless tobacco. He reports that he does not drink alcohol and does not use drugs.   Allergies  Allergen Reactions   Lipitor [Atorvastatin]     Family History  Problem Relation Age of Onset   Hypertension Mother    Clotting disorder Mother    COPD Father      Prior to Admission medications   Medication Sig Start Date End Date Taking? Authorizing Provider  acetaminophen (TYLENOL) 500 MG tablet Take 1,000 mg by mouth every 6 (six) hours as needed.   Yes [provider]  albuterol (PROVENTIL) (2.5 MG/3ML) 0.083% nebulizer solution INHALE THE CONTENTS OF 1 VIAL VIA NEBULIZER EVERY 6 HOURS AS NEEDED FOR WHEEZING OR SHORTNESS OF BREATH 04/19/21  Yes Tanda Rockers, MD  albuterol (VENTOLIN HFA) 108 (  90 Base) MCG/ACT inhaler USE 2 PUFFS BY MOUTH EVERY 4 HOURS AS NEEDED 05/04/21  Yes Tanda Rockers, MD  budesonide-formoterol [facility]) 160-4.5 MCG/ACT inhaler Take 2 puffs first thing in am and then another 2 puffs about 12 hours later. 10/27/20  Yes Tanda Rockers, MD  clopidogrel (PLAVIX) 75 MG tablet Take 75 mg by mouth daily.   Yes [provider]   fenofibrate micronized (LOFIBRA) 134 MG capsule Take 134 mg by mouth daily before breakfast.   Yes [provider]  montelukast (SINGULAIR) 10 MG tablet Take 10 mg by mouth at bedtime.   Yes [provider]  pantoprazole (PROTONIX) 40 MG tablet Take 1 tablet (40 mg total) by mouth daily. 04/09/21  Yes Emokpae, Courage, MD  propafenone (RYTHMOL) 225 MG tablet Take 1 tablet (225 mg total) by mouth every 8 (eight) hours. 02/03/21  Yes Josue Hector, MD  propranolol (INDERAL) 40 MG tablet Take 40 mg by mouth 2 (two) times daily. 05/05/21  Yes [provider]  tamsulosin (FLOMAX) 0.4 MG CAPS capsule Take 1 capsule (0.4 mg total) by mouth daily. 07/08/20  Yes McKenzie, Candee Furbish, MD  torsemide (DEMADEX) 100 MG tablet Take 0.5 tablets (50 mg total) by mouth daily. 04/08/21  Yes Emokpae, Courage, MD  varenicline (CHANTIX STARTING MONTH PAK) 0.5 MG X 11 & 1 MG X 42 tablet Take one 0.5 mg tablet by mouth once daily for 3 days, then increase to one 0.5 mg tablet twice daily for 4 days, then increase to one 1 mg tablet twice daily. Patient not taking: No sig reported 04/08/21   Roxan Hockey, MD    Physical Exam: BP 103/61   Pulse 91   Temp 98.4 F (36.9 C) (Oral)   Resp (!) 21   Ht '5\' 7"'$  (1.702 m)   Wt 81 kg   SpO2 97%   BMI 27.97 kg/m   General: 75 y.o. year-old male well developed well nourished in no acute distress.  Alert and oriented x3. HEENT: NCAT, EOMI Neck: Supple, trachea medial Cardiovascular: Regular rate and rhythm with no rubs or gallops.  No thyromegaly or JVD noted.  No lower extremity edema. 2/4 pulses in all 4 extremities. Respiratory: Decreased breath sounds and scattered wheezing.   Abdomen: Soft, nontender nondistended with normal bowel sounds x4 quadrants. Muskuloskeletal: No cyanosis, clubbing or edema noted bilaterally Neuro: CN II-XII intact, strength 5/5 x 4, sensation, reflexes intact Skin: No ulcerative lesions noted or rashes Psychiatry:  Judgement and insight appear normal. Mood is appropriate for condition and setting          Labs on Admission:  Basic Metabolic Panel: Recent Labs  Lab 05/18/21 1548  NA 133*  K 4.2  CL 93*  CO2 32  GLUCOSE 96  BUN 15  CREATININE 1.27*  CALCIUM 9.3   Liver Function Tests: Recent Labs  Lab 05/18/21 1548  AST 20  ALT 13  ALKPHOS 68  BILITOT 0.9  PROT 6.8  ALBUMIN 3.9   Recent Labs  Lab 05/18/21 1548  LIPASE 31   No results for input(s): AMMONIA in the last 168 hours. CBC: Recent Labs  Lab 05/18/21 1548  WBC 9.7  NEUTROABS 7.8*  HGB 13.5  HCT 42.3  MCV 97.7  PLT 165   Cardiac Enzymes: No results for input(s): CKTOTAL, CKMB, CKMBINDEX, TROPONINI in the last 168 hours.  BNP (last 3 results) Recent Labs    04/05/21 1847 05/18/21 1548  BNP 190.0* 241.0*  ProBNP (last 3 results) No results for input(s): PROBNP in the last 8760 hours.  CBG: No results for input(s): GLUCAP in the last 168 hours.  Radiological Exams on Admission: DG Chest Portable 1 View  Result Date: 05/18/2021 CLINICAL DATA:  Shortness of breath EXAM: PORTABLE CHEST 1 VIEW COMPARISON:  04/05/2021, CT 04/05/2021 FINDINGS: Post sternotomy changes. Emphysematous disease and mild bronchitic change. No focal opacity or pleural effusion. Stable cardiomediastinal silhouette with aortic atherosclerosis. No pneumothorax. IMPRESSION: No active disease.  Emphysema and mild bronchitic changes Electronically Signed   By: Donavan Foil M.D.   On: 05/18/2021 16:03    EKG: I independently viewed the EKG done and my findings are as followed: Ectopic atrial rhythm at a rate of 87 bpm with APCs  Assessment/Plan Present on Admission:  COVID-19 virus infection  Acute exacerbation of chronic obstructive pulmonary disease (COPD) ([place])  Principal Problem:   Acute exacerbation of chronic obstructive pulmonary disease (COPD) ([place]) Active Problems:   BPH (benign prostatic hyperplasia)   COVID-19 virus  infection   Acute on chronic respiratory failure with hypoxia and hypercapnia (HCC)   Atrial fibrillation, chronic (HCC)   Chronic kidney disease  Acute on chronic hypoxia with hypercapnia secondary to exacerbation of COPD in the setting of COVID-19 virus infection Continue duo nebs, Mucinex, azithromycin. Continue Protonix to prevent steroid-induced ulcer Continue incentive spirometry and flutter valve Continue albuterol q.6h Continue IV solumedrol '40mg'$  bid Continue IV Remdesivir per pharmacy protocol Continue vitamin-C 500 mg p.o. Daily Continue zinc 220 mg p.o. Daily Continue Mucinex, Robitussin  Continue Tylenol p.r.n. for fever Continue supplemental oxygen to maintain O2 sat > or = 94% with plan to wean patient off supplemental oxygen as tolerated (of note, patient does not use oxygen at baseline) Continue incentive spirometry and flutter valve q64mn as tolerated Encourage proning, early ambulation, and side laying as tolerated Continue airborne isolation precaution Inflammatory markers: Continue monitoring daily inflammatory markers Physician PPE:  Surgical mask with face shield, N-95, nonsterile gloves, disposable gown, head and shoe cover s Patient PPE:  Face mask   Chronic atrial fibrillation Hold beta-blocker due to soft BP Continue Plavix  Chronic kidney disease stage IIIA Creatinine is within baseline range Renally adjust medications, avoid nephrotoxic agents/dehydration/hypotension  BPH Continue Flomax   DVT prophylaxis: Lovenox  Code Status: Full code  Family Communication: None at bedside  Disposition Plan:  Patient is from:                        home Anticipated DC to:                   SNF or family members home Anticipated DC date:               2-3 days Anticipated DC barriers:          Patient requires inpatient management due to worsening hypoxia and hypercapnia in the setting of COPD and COVID-19 virus infection  Consults called:  None  Admission status: Inpatient    OBernadette HoitMD [facility]  05/19/2021, 2:48 AM

## 2021-05-19 NOTE — Progress Notes (Signed)
Patient seen and examined. Admitted after midnight secondary to acute on chronic resp failure with hypoxia due to COPD exacerbation in the setting of COVID infection. Patient hemodynamically stable and with good saturation at rest currently while using only 2L [place]; at baseline patient uses oxygen PRN only, expressed needing up to 4L with activity prior to admission. Venous blood gas with minimal elevation on CO2 not correlating with hypercapnia component; there is not need for CPAP or BIPAP at this time. Please refer to H&P written by Dr. Josephine Cables on 05/19/21 for further info/details on admission.  Plan: -continue steroids, bronchodilators and the use of zithromax for presumed bronchiectasis component. -will continue use of Singulair, flutter valve and incentive spirometry. -for COVID infection will treat with remdesivir and follow inflammatory markers -continue PRN antitussive meds.  Barton Dubois MD [phone]

## 2021-05-19 NOTE — ED Notes (Signed)
Pt is alert and oriented.  resp even and unlabored.  No sob noted.  Denies any complaints at this time.

## 2021-05-20 DIAGNOSIS — N1831 Chronic kidney disease, stage 3a: Secondary | ICD-10-CM

## 2021-05-20 DIAGNOSIS — N400 Enlarged prostate without lower urinary tract symptoms: Secondary | ICD-10-CM

## 2021-05-20 DIAGNOSIS — I482 Chronic atrial fibrillation, unspecified: Secondary | ICD-10-CM

## 2021-05-20 DIAGNOSIS — J9622 Acute and chronic respiratory failure with hypercapnia: Secondary | ICD-10-CM

## 2021-05-20 DIAGNOSIS — J9621 Acute and chronic respiratory failure with hypoxia: Secondary | ICD-10-CM

## 2021-05-20 LAB — CBC WITH DIFFERENTIAL/PLATELET
Abs Immature Granulocytes: 0.01 10*3/uL (ref 0.00–0.07)
Basophils Absolute: 0 10*3/uL (ref 0.0–0.1)
Basophils Relative: 0 %
Eosinophils Absolute: 0 10*3/uL (ref 0.0–0.5)
Eosinophils Relative: 0 %
HCT: 40.2 % (ref 39.0–52.0)
Hemoglobin: 13.1 g/dL (ref 13.0–17.0)
Immature Granulocytes: 0 %
Lymphocytes Relative: 9 %
Lymphs Abs: 0.5 10*3/uL — ABNORMAL LOW (ref 0.7–4.0)
MCH: 31.1 pg (ref 26.0–34.0)
MCHC: 32.6 g/dL (ref 30.0–36.0)
MCV: 95.5 fL (ref 80.0–100.0)
Monocytes Absolute: 0.2 10*3/uL (ref 0.1–1.0)
Monocytes Relative: 4 %
Neutro Abs: 4.5 10*3/uL (ref 1.7–7.7)
Neutrophils Relative %: 87 %
Platelets: 166 10*3/uL (ref 150–400)
RBC: 4.21 MIL/uL — ABNORMAL LOW (ref 4.22–5.81)
RDW: 12.9 % (ref 11.5–15.5)
WBC: 5.1 10*3/uL (ref 4.0–10.5)
nRBC: 0 % (ref 0.0–0.2)

## 2021-05-20 LAB — COMPREHENSIVE METABOLIC PANEL
ALT: 14 U/L (ref 0–44)
AST: 17 U/L (ref 15–41)
Albumin: 3.4 g/dL — ABNORMAL LOW (ref 3.5–5.0)
Alkaline Phosphatase: 67 U/L (ref 38–126)
Anion gap: 11 (ref 5–15)
BUN: 38 mg/dL — ABNORMAL HIGH (ref 8–23)
CO2: 36 mmol/L — ABNORMAL HIGH (ref 22–32)
Calcium: 8.9 mg/dL (ref 8.9–10.3)
Chloride: 89 mmol/L — ABNORMAL LOW (ref 98–111)
Creatinine, Ser: 1.59 mg/dL — ABNORMAL HIGH (ref 0.61–1.24)
GFR, Estimated: 45 mL/min — ABNORMAL LOW (ref >60)
Glucose, Bld: 137 mg/dL — ABNORMAL HIGH (ref 70–99)
Potassium: 3.7 mmol/L (ref 3.5–5.1)
Sodium: 136 mmol/L (ref 135–145)
Total Bilirubin: 0.7 mg/dL (ref 0.3–1.2)
Total Protein: 6.2 g/dL — ABNORMAL LOW (ref 6.5–8.1)

## 2021-05-20 LAB — C-REACTIVE PROTEIN: CRP: 3.4 mg/dL — ABNORMAL HIGH (ref <1.0)

## 2021-05-20 LAB — D-DIMER, QUANTITATIVE: D-Dimer, Quant: 2.15 ug/mL-FEU — ABNORMAL HIGH (ref 0.00–0.50)

## 2021-05-20 LAB — FERRITIN: Ferritin: 144 ng/mL (ref 24–336)

## 2021-05-20 LAB — PHOSPHORUS: Phosphorus: 3 mg/dL (ref 2.5–4.6)

## 2021-05-20 LAB — MAGNESIUM: Magnesium: 1.9 mg/dL (ref 1.7–2.4)

## 2021-05-20 NOTE — Final Progress Note (Signed)
PROGRESS NOTE    Daniel Barker  [id number] DOB: [birth year] DOA: 05/18/2021 PCP: [name], MD    Chief Complaint  Patient presents with   Shortness of Breath    Brief admission narrative:  Daniel Barker is a 75 y.o. male with medical history significant for  asthma, COPD, chronic hypoxic respiratory failure on 2 L of oxygen,, chronic atrial fibrillation, BPH, CKD stage IIIa, CAD who presents to the emergency department due to 1 day onset of worsening shortness of breath.  He complained of mild productive cough which was clear in nature.  Patient has had to increase O2 sat of 4 LPM due to increasing work of breathing.  He denies fever, chest pain, leg swelling, nausea or vomiting   ED Course:  In the emergency department, he was hemodynamically stable, though intermittently tachypneic.  Work-up in the ED showed normal CBC, mild hyponatremia, ABG showed hypoxia and hypercapnia.  Influenza A and B was negative.  SARS coronavirus 2 was positive. Chest x-ray showed no active disease, but showed emphysema and mild bronchitis changes. Breathing treatment was provided, Solu-Medrol was given, patient was started on IV remdesivir.  Hospitalist was asked to admit patient for further evaluation and management.   Assessment & Plan: 1-Acute exacerbation of chronic obstructive pulmonary disease (COPD) ([place]) -Continue treatment with steroids, bronchodilators, Zithromax, flutter valve and incentive spirometer. -Wean off oxygen supplementation as tolerated -Continue treatment for COVID.  2-acute on chronic respiratory failure with hypoxia in the setting of COPD exacerbation and COVID infection -Continue treatment with steroids, bronchodilators, supportive care and oxygen supplementation. -As mentioned above will try to wean off to room air as tolerated  3-COVID-19 infection -Continue to follow inflammatory markers -Continue vitamin C and zinc -Follow inflammatory markers -Day 3 out of 5 of  remdesivir -Continue steroids. -Patient is improving.  4-BPH (benign prostatic hyperplasia) -Continue Flomax and Cardura -No complaints of urinary retention currently.  5-history of chronic atrial fibrillation -Not chronically on anticoagulation -Rate controlled -Continue the use of Plavix.  6-chronic kidney disease a stage IIIa -Appears to be stable and at baseline for him -Continue to follow renal function trend to assess stability. -Minimize the use of nephrotoxic agents, avoid hypotension and maintain adequate hydration.  DVT prophylaxis: Lovenox Code Status: Full code. Family Communication: No family at bedside. Disposition:   Status is: Inpatient  Remains inpatient appropriate because:IV treatments appropriate due to intensity of illness or inability to take PO  Dispo: The patient is from: Home              Anticipated d/c is to: Home              Patient currently is not medically stable to d/c.   Difficult to place patient No    Consultants:  None  Procedures:  See below for x-ray reports  Antimicrobials:  Zithromax Remdesivir (3 out of 5).  Subjective: Still having difficulty speaking in full sentences, complaining of intermittent coughing spells and requiring oxygen supplementation.  Patient is afebrile.  Objective: Vitals:   05/19/21 2122 05/20/21 0234 05/20/21 0239 05/20/21 0511  BP: 132/82 136/80  128/77  Pulse: 88 82  87  Resp: '16 20  16  '$ Temp: 98.1 F (36.7 C) 98.1 F (36.7 C)  97.7 F (36.5 C)  TempSrc: Oral Oral  Oral  SpO2: 97% 96% 96% 97%  Weight:      Height:        Intake/Output Summary (Last 24 hours) at 05/20/2021 1858 Last data  filed at 05/20/2021 1200 Gross per 24 hour  Intake 1065.84 ml  Output 1400 ml  Net -334.16 ml   Filed Weights   05/18/21 1535  Weight: 81 kg    Examination:  General exam: Appears calm and comfortable; reports breathing is improving and denies chest pain.  Still with mild difficulty speaking in  full sentences and requiring 2.5 L oxygen supplementation. Respiratory system: Bilateral rhonchi, positive expiratory wheezing; no using accessory muscle. Cardiovascular system: S1 & S2 heard, RRR. No JVD, murmurs, rubs, gallops or clicks. No pedal edema. Gastrointestinal system: Abdomen is nondistended, soft and nontender. No organomegaly or masses felt. Normal bowel sounds heard. Central nervous system: Alert and oriented. No focal neurological deficits. Extremities: No cyanosis or clubbing. Skin: No petechiae.   Psychiatry: Judgement and insight appear normal. Mood & affect appropriate.     Data Reviewed: I have personally reviewed following labs and imaging studies  CBC: Recent Labs  Lab 05/18/21 1548 05/19/21 0434 05/20/21 0454  WBC 9.7 3.1* 5.1  NEUTROABS 7.8*  --  4.5  HGB 13.5 12.6* 13.1  HCT 42.3 39.6 40.2  MCV 97.7 97.5 95.5  PLT 165 154 [id number]    Basic Metabolic Panel: Recent Labs  Lab 05/18/21 1548 05/19/21 0434 05/20/21 0454  NA 133* 134* 136  K 4.2 5.0 3.7  CL 93* 96* 89*  CO2 32 31 36*  GLUCOSE 96 132* 137*  BUN 15 18 38*  CREATININE 1.27* 1.35* 1.59*  CALCIUM 9.3 9.0 8.9  MG  --  1.8 1.9  PHOS  --  4.1 3.0    GFR: Estimated Creatinine Clearance: 41.6 mL/min (A) (by C-G formula based on SCr of 1.59 mg/dL (H)).  Liver Function Tests: Recent Labs  Lab 05/18/21 1548 05/19/21 0434 05/20/21 0454  AST '20 18 17  '$ ALT '13 12 14  '$ ALKPHOS 68 62 67  BILITOT 0.9 0.8 0.7  PROT 6.8 6.0* 6.2*  ALBUMIN 3.9 3.4* 3.4*    CBG: No results for input(s): GLUCAP in the last 168 hours.   Recent Results (from the past 240 hour(s))  Resp Panel by RT-PCR (Flu A&B, Covid) Nasopharyngeal Swab     Status: Abnormal   Collection Time: 05/18/21  3:47 PM   Specimen: Nasopharyngeal Swab; Nasopharyngeal(NP) swabs in vial transport medium  Result Value Ref Range Status   SARS Coronavirus 2 by RT PCR POSITIVE (A) NEGATIVE Final    Comment: RESULT CALLED TO, READ BACK BY  AND VERIFIED WITH: DAVIS,N @ [id number] ON 05/18/21 BY JUW (NOTE) SARS-CoV-2 target nucleic acids are DETECTED.  The SARS-CoV-2 RNA is generally detectable in upper respiratory specimens during the acute phase of infection. Positive results are indicative of the presence of the identified virus, but do not rule out bacterial infection or co-infection with other pathogens not detected by the test. Clinical correlation with patient history and other diagnostic information is necessary to determine patient infection status. The expected result is Negative.  Fact Sheet for Patients: EntrepreneurPulse.com.au  Fact Sheet for Healthcare Providers: IncredibleEmployment.be  This test is not yet approved or cleared by the [place] FDA and  has been authorized for detection and/or diagnosis of SARS-CoV-2 by FDA under an Emergency Use Authorization (EUA).  This EUA will remain in effect (meaning this test can be  used) for the duration of  the COVID-19 declaration under Section 564(b)(1) of the Act, 21 U.S.C. section 360bbb-3(b)(1), unless the authorization is terminated or revoked sooner.     Influenza A by PCR NEGATIVE  NEGATIVE Final   Influenza B by PCR NEGATIVE NEGATIVE Final    Comment: (NOTE) The Xpert Xpress SARS-CoV-2/FLU/RSV plus assay is intended as an aid in the diagnosis of influenza from Nasopharyngeal swab specimens and should not be used as a sole basis for treatment. Nasal washings and aspirates are unacceptable for Xpert Xpress SARS-CoV-2/FLU/RSV testing.  Fact Sheet for Patients: EntrepreneurPulse.com.au  Fact Sheet for Healthcare Providers: IncredibleEmployment.be  This test is not yet approved or cleared by the [place] FDA and has been authorized for detection and/or diagnosis of SARS-CoV-2 by FDA under an Emergency Use Authorization (EUA). This EUA will remain in effect (meaning this test  can be used) for the duration of the COVID-19 declaration under Section 564(b)(1) of the Act, 21 U.S.C. section 360bbb-3(b)(1), unless the authorization is terminated or revoked.  Performed at [facility], [address], [place], [place] [address]      Radiology Studies: No results found.   Scheduled Meds:  vitamin C  500 mg Oral Daily   azithromycin  250 mg Oral Daily   clopidogrel  75 mg Oral Daily   dextromethorphan-guaiFENesin  1 tablet Oral BID   enoxaparin (LOVENOX) injection  40 mg Subcutaneous Q24H   Ipratropium-Albuterol  1 puff Inhalation Q6H   methylPREDNISolone (SOLU-MEDROL) injection  40 mg Intravenous Q8H   mometasone-formoterol  2 puff Inhalation BID   montelukast  10 mg Oral QHS   pantoprazole  40 mg Oral Daily   propafenone  225 mg Oral Q8H   tamsulosin  0.4 mg Oral Daily   torsemide  50 mg Oral Daily   zinc sulfate  220 mg Oral Daily   Continuous Infusions:  remdesivir 100 mg in NS 100 mL 100 mg (05/20/21 1055)     LOS: 2 days    Time spent: 35 minutes    Barton Dubois, MD [facility]   To contact the attending provider between 7A-7P or the covering provider during after hours 7P-7A, please log into the web site [email or web address] and access using universal [place] password for that web site. If you do not have the password, please call the hospital operator.  05/20/2021, 6:58 PM

## 2021-05-21 LAB — COMPREHENSIVE METABOLIC PANEL
ALT: 17 U/L (ref 0–44)
AST: 19 U/L (ref 15–41)
Albumin: 3.6 g/dL (ref 3.5–5.0)
Alkaline Phosphatase: 62 U/L (ref 38–126)
Anion gap: 11 (ref 5–15)
BUN: 51 mg/dL — ABNORMAL HIGH (ref 8–23)
CO2: 43 mmol/L — ABNORMAL HIGH (ref 22–32)
Calcium: 9 mg/dL (ref 8.9–10.3)
Chloride: 84 mmol/L — ABNORMAL LOW (ref 98–111)
Creatinine, Ser: 2.01 mg/dL — ABNORMAL HIGH (ref 0.61–1.24)
GFR, Estimated: 34 mL/min — ABNORMAL LOW (ref >60)
Glucose, Bld: 178 mg/dL — ABNORMAL HIGH (ref 70–99)
Potassium: 3.8 mmol/L (ref 3.5–5.1)
Sodium: 138 mmol/L (ref 135–145)
Total Bilirubin: 0.7 mg/dL (ref 0.3–1.2)
Total Protein: 6.1 g/dL — ABNORMAL LOW (ref 6.5–8.1)

## 2021-05-21 LAB — CBC WITH DIFFERENTIAL/PLATELET
Abs Immature Granulocytes: 0.04 10*3/uL (ref 0.00–0.07)
Basophils Absolute: 0 10*3/uL (ref 0.0–0.1)
Basophils Relative: 0 %
Eosinophils Absolute: 0 10*3/uL (ref 0.0–0.5)
Eosinophils Relative: 0 %
HCT: 45.3 % (ref 39.0–52.0)
Hemoglobin: 14.4 g/dL (ref 13.0–17.0)
Immature Granulocytes: 1 %
Lymphocytes Relative: 5 %
Lymphs Abs: 0.4 10*3/uL — ABNORMAL LOW (ref 0.7–4.0)
MCH: 30.8 pg (ref 26.0–34.0)
MCHC: 31.8 g/dL (ref 30.0–36.0)
MCV: 96.8 fL (ref 80.0–100.0)
Monocytes Absolute: 0.3 10*3/uL (ref 0.1–1.0)
Monocytes Relative: 3 %
Neutro Abs: 7.2 10*3/uL (ref 1.7–7.7)
Neutrophils Relative %: 91 %
Platelets: 173 10*3/uL (ref 150–400)
RBC: 4.68 MIL/uL (ref 4.22–5.81)
RDW: 12.7 % (ref 11.5–15.5)
WBC: 7.9 10*3/uL (ref 4.0–10.5)
nRBC: 0 % (ref 0.0–0.2)

## 2021-05-21 LAB — D-DIMER, QUANTITATIVE: D-Dimer, Quant: 1.95 ug/mL-FEU — ABNORMAL HIGH (ref 0.00–0.50)

## 2021-05-21 LAB — PHOSPHORUS: Phosphorus: 3.3 mg/dL (ref 2.5–4.6)

## 2021-05-21 LAB — MAGNESIUM: Magnesium: 1.9 mg/dL (ref 1.7–2.4)

## 2021-05-21 LAB — FERRITIN: Ferritin: 164 ng/mL (ref 24–336)

## 2021-05-21 LAB — C-REACTIVE PROTEIN: CRP: 2.1 mg/dL — ABNORMAL HIGH (ref <1.0)

## 2021-05-21 MED ORDER — PRAMIPEXOLE DIHYDROCHLORIDE 0.25 MG PO TABS
0.2500 mg | ORAL_TABLET | Freq: Every day | ORAL | Status: DC
  Administered 2021-05-21 – 2021-05-22 (×2): 0.25 mg via ORAL
  Filled 2021-05-21 (×3): qty 1

## 2021-05-21 MED ORDER — IPRATROPIUM-ALBUTEROL 20-100 MCG/ACT IN AERS
1.0000 | INHALATION_SPRAY | Freq: Four times a day (QID) | RESPIRATORY_TRACT | Status: DC | PRN
  Administered 2021-05-21: 1 via RESPIRATORY_TRACT

## 2021-05-21 MED ORDER — IPRATROPIUM-ALBUTEROL 20-100 MCG/ACT IN AERS: 1.0000 | INHALATION_SPRAY | Freq: Three times a day (TID) | RESPIRATORY_TRACT | Status: DC

## 2021-05-21 NOTE — Progress Notes (Signed)
Patient had gotten up to go to bathroom.  I came in to give breathing treatment and O2 sat was 87 to 88%.  Turned patient's O2 up to 2L and now patient is up to 93%.  Can be titrated down again when patient recovers.

## 2021-05-21 NOTE — Final Progress Note (Addendum)
PROGRESS NOTE    Daniel Barker  [id number] DOB: 07/30/1946 DOA: 05/18/2021 PCP: [name], MD    Chief Complaint  Patient presents with   Shortness of Breath    Brief admission narrative:  Daniel Barker is a 76 y.o. male with medical history significant for  asthma, COPD, chronic hypoxic respiratory failure on 2 L of oxygen,, chronic atrial fibrillation, BPH, CKD stage IIIa, CAD who presents to the emergency department due to 1 day onset of worsening shortness of breath.  He complained of mild productive cough which was clear in nature.  Patient has had to increase O2 sat of 4 LPM due to increasing work of breathing.  He denies fever, chest pain, leg swelling, nausea or vomiting   ED Course:  In the emergency department, he was hemodynamically stable, though intermittently tachypneic.  Work-up in the ED showed normal CBC, mild hyponatremia, ABG showed hypoxia and hypercapnia.  Influenza A and B was negative.  SARS coronavirus 2 was positive. Chest x-ray showed no active disease, but showed emphysema and mild bronchitis changes. Breathing treatment was provided, Solu-Medrol was given, patient was started on IV remdesivir.  Hospitalist was asked to admit patient for further evaluation and management.   Assessment & Plan: 1-Acute exacerbation of chronic obstructive pulmonary disease (COPD) ([place]) -Continue treatment with steroids, bronchodilators, Zithromax, flutter valve and incentive spirometer. -Continue to wean off oxygen supplementation as tolerated -Continue treatment for COVID.  2-acute on chronic respiratory failure with hypoxia in the setting of COPD exacerbation and COVID infection -Continue treatment with steroids, bronchodilators, supportive care and oxygen supplementation. -As mentioned above will try to wean off to room air as tolerated  3-COVID-19 infection -Continue to follow inflammatory markers -Continue vitamin C and zinc -Follow inflammatory markers -Day 4  out of 5 of remdesivir -Continue steroids. -Patient is improving. -Flutter valve/incentive respirometer has been encouraged.  4-BPH (benign prostatic hyperplasia) -Continue Flomax and Cardura -No complaints of urinary retention currently.  5-history of chronic atrial fibrillation -Not chronically on anticoagulation -Rate controlled -Continue the use of Plavix.  6-chronic kidney disease a stage IIIa -Appears to be stable and at baseline for him -Continue to follow renal function trend to assess stability. -Minimize the use of nephrotoxic agents, avoid hypotension and maintain adequate hydration.  7-restless leg syndrome -Mirapex low-dose will be started nightly.  DVT prophylaxis: Lovenox Code Status: Full code. Family Communication: No family at bedside. Disposition:   Status is: Inpatient  Remains inpatient appropriate because:IV treatments appropriate due to intensity of illness or inability to take PO  Dispo: The patient is from: Home              Anticipated d/c is to: Home              Patient currently is not medically stable to d/c.   Difficult to place patient No    Consultants:  None  Procedures:  See below for x-ray reports  Antimicrobials:  Zithromax Remdesivir (4 out of 5).  Subjective: Reported improvement in his breathing; down to 2 L nasal cannula supplementation unable to speak in full sentences today.  Objective: Vitals:   05/20/21 1949 05/20/21 2045 05/21/21 0409 05/21/21 1054  BP: 131/78  (!) 154/89   Pulse: 82 80 79   Resp: '16 20 16   '$ Temp: 98.4 F (36.9 C)  98.3 F (36.8 C)   TempSrc: Oral  Oral   SpO2: 98% 97% 97% 95%  Weight:      Height:  Intake/Output Summary (Last 24 hours) at 05/21/2021 1237 Last data filed at 05/21/2021 0700 Gross per 24 hour  Intake 1089.04 ml  Output 1201 ml  Net -111.96 ml   Filed Weights   05/18/21 1535  Weight: 81 kg    Examination: General exam: Alert, awake, oriented x 3; reporting  improvement in his breathing.  No fever, no nausea, no vomiting.  Patient is complaining of restless leg discomfort at bedtime. Respiratory system: Positive rhonchi, mild expiratory wheezing, no using accessory muscle. Cardiovascular system:RRR. No murmurs, rubs, gallops.  No JVD. Gastrointestinal system: Abdomen is nondistended, soft and nontender. No organomegaly or masses felt. Normal bowel sounds heard. Central nervous system: Alert and oriented. No focal neurological deficits. Extremities: No cyanosis or clubbing. Skin: No petechiae. Psychiatry: Judgement and insight appear normal. Mood & affect appropriate.    Data Reviewed: I have personally reviewed following labs and imaging studies  CBC: Recent Labs  Lab 05/18/21 1548 05/19/21 0434 05/20/21 0454 05/21/21 0625  WBC 9.7 3.1* 5.1 7.9  NEUTROABS 7.8*  --  4.5 7.2  HGB 13.5 12.6* 13.1 14.4  HCT 42.3 39.6 40.2 45.3  MCV 97.7 97.5 95.5 96.8  PLT 165 154 166 [id number]    Basic Metabolic Panel: Recent Labs  Lab 05/18/21 1548 05/19/21 0434 05/20/21 0454 05/21/21 0625  NA 133* 134* 136 138  K 4.2 5.0 3.7 3.8  CL 93* 96* 89* 84*  CO2 32 31 36* 43*  GLUCOSE 96 132* 137* 178*  BUN 15 18 38* 51*  CREATININE 1.27* 1.35* 1.59* 2.01*  CALCIUM 9.3 9.0 8.9 9.0  MG  --  1.8 1.9 1.9  PHOS  --  4.1 3.0 3.3    GFR: Estimated Creatinine Clearance: 32.9 mL/min (A) (by C-G formula based on SCr of 2.01 mg/dL (H)).  Liver Function Tests: Recent Labs  Lab 05/18/21 1548 05/19/21 0434 05/20/21 0454 05/21/21 0625  AST '20 18 17 19  '$ ALT '13 12 14 17  '$ ALKPHOS 68 62 67 62  BILITOT 0.9 0.8 0.7 0.7  PROT 6.8 6.0* 6.2* 6.1*  ALBUMIN 3.9 3.4* 3.4* 3.6    CBG: No results for input(s): GLUCAP in the last 168 hours.   Recent Results (from the past 240 hour(s))  Resp Panel by RT-PCR (Flu A&B, Covid) Nasopharyngeal Swab     Status: Abnormal   Collection Time: 05/18/21  3:47 PM   Specimen: Nasopharyngeal Swab; Nasopharyngeal(NP) swabs in  vial transport medium  Result Value Ref Range Status   SARS Coronavirus 2 by RT PCR POSITIVE (A) NEGATIVE Final    Comment: RESULT CALLED TO, READ BACK BY AND VERIFIED WITH: DAVIS,N @ [id number] ON 05/18/21 BY JUW (NOTE) SARS-CoV-2 target nucleic acids are DETECTED.  The SARS-CoV-2 RNA is generally detectable in upper respiratory specimens during the acute phase of infection. Positive results are indicative of the presence of the identified virus, but do not rule out bacterial infection or co-infection with other pathogens not detected by the test. Clinical correlation with patient history and other diagnostic information is necessary to determine patient infection status. The expected result is Negative.  Fact Sheet for Patients: EntrepreneurPulse.com.au  Fact Sheet for Healthcare Providers: IncredibleEmployment.be  This test is not yet approved or cleared by the [place] FDA and  has been authorized for detection and/or diagnosis of SARS-CoV-2 by FDA under an Emergency Use Authorization (EUA).  This EUA will remain in effect (meaning this test can be  used) for the duration of  the COVID-19 declaration under  Section 564(b)(1) of the Act, 21 U.S.C. section 360bbb-3(b)(1), unless the authorization is terminated or revoked sooner.     Influenza A by PCR NEGATIVE NEGATIVE Final   Influenza B by PCR NEGATIVE NEGATIVE Final    Comment: (NOTE) The Xpert Xpress SARS-CoV-2/FLU/RSV plus assay is intended as an aid in the diagnosis of influenza from Nasopharyngeal swab specimens and should not be used as a sole basis for treatment. Nasal washings and aspirates are unacceptable for Xpert Xpress SARS-CoV-2/FLU/RSV testing.  Fact Sheet for Patients: EntrepreneurPulse.com.au  Fact Sheet for Healthcare Providers: IncredibleEmployment.be  This test is not yet approved or cleared by the [place] FDA and has been  authorized for detection and/or diagnosis of SARS-CoV-2 by FDA under an Emergency Use Authorization (EUA). This EUA will remain in effect (meaning this test can be used) for the duration of the COVID-19 declaration under Section 564(b)(1) of the Act, 21 U.S.C. section 360bbb-3(b)(1), unless the authorization is terminated or revoked.  Performed at [facility], [address], [place], [place] [address]      Radiology Studies: No results found.   Scheduled Meds:  vitamin C  500 mg Oral Daily   azithromycin  250 mg Oral Daily   clopidogrel  75 mg Oral Daily   dextromethorphan-guaiFENesin  1 tablet Oral BID   enoxaparin (LOVENOX) injection  40 mg Subcutaneous Q24H   methylPREDNISolone (SOLU-MEDROL) injection  40 mg Intravenous Q8H   mometasone-formoterol  2 puff Inhalation BID   montelukast  10 mg Oral QHS   pantoprazole  40 mg Oral Daily   pramipexole  0.25 mg Oral QHS   propafenone  225 mg Oral Q8H   tamsulosin  0.4 mg Oral Daily   torsemide  50 mg Oral Daily   zinc sulfate  220 mg Oral Daily   Continuous Infusions:  remdesivir 100 mg in NS 100 mL 100 mg (05/21/21 0934)     LOS: 3 days    Time spent: 35 minutes    Barton Dubois, MD [facility]   To contact the attending provider between 7A-7P or the covering provider during after hours 7P-7A, please log into the web site [email or web address] and access using universal [place] password for that web site. If you do not have the password, please call the hospital operator.  05/21/2021, 12:37 PM

## 2021-05-21 NOTE — Progress Notes (Signed)
Patient o2 titrated to 1L , at rest his oxygen was 95% . On room air at rest, he remained in the 90's . Patient walked in place for about 20 seconds and became SOB , his o2 went to 89% Room air. Placed on 2L and he came back up to 96%.

## 2021-05-21 NOTE — Plan of Care (Signed)

## 2021-05-22 DIAGNOSIS — R0600 Dyspnea, unspecified: Secondary | ICD-10-CM

## 2021-05-22 LAB — COMPREHENSIVE METABOLIC PANEL
ALT: 22 U/L (ref 0–44)
AST: 23 U/L (ref 15–41)
Albumin: 3.4 g/dL — ABNORMAL LOW (ref 3.5–5.0)
Alkaline Phosphatase: 58 U/L (ref 38–126)
Anion gap: 13 (ref 5–15)
BUN: 64 mg/dL — ABNORMAL HIGH (ref 8–23)
CO2: 42 mmol/L — ABNORMAL HIGH (ref 22–32)
Calcium: 8.8 mg/dL — ABNORMAL LOW (ref 8.9–10.3)
Chloride: 85 mmol/L — ABNORMAL LOW (ref 98–111)
Creatinine, Ser: 2.39 mg/dL — ABNORMAL HIGH (ref 0.61–1.24)
GFR, Estimated: 28 mL/min — ABNORMAL LOW (ref >60)
Glucose, Bld: 154 mg/dL — ABNORMAL HIGH (ref 70–99)
Potassium: 3.3 mmol/L — ABNORMAL LOW (ref 3.5–5.1)
Sodium: 140 mmol/L (ref 135–145)
Total Bilirubin: 1 mg/dL (ref 0.3–1.2)
Total Protein: 6 g/dL — ABNORMAL LOW (ref 6.5–8.1)

## 2021-05-22 LAB — CBC WITH DIFFERENTIAL/PLATELET
Abs Immature Granulocytes: 0.03 10*3/uL (ref 0.00–0.07)
Basophils Absolute: 0 10*3/uL (ref 0.0–0.1)
Basophils Relative: 0 %
Eosinophils Absolute: 0 10*3/uL (ref 0.0–0.5)
Eosinophils Relative: 0 %
HCT: 46.5 % (ref 39.0–52.0)
Hemoglobin: 14.7 g/dL (ref 13.0–17.0)
Immature Granulocytes: 0 %
Lymphocytes Relative: 6 %
Lymphs Abs: 0.5 10*3/uL — ABNORMAL LOW (ref 0.7–4.0)
MCH: 30.7 pg (ref 26.0–34.0)
MCHC: 31.6 g/dL (ref 30.0–36.0)
MCV: 97.1 fL (ref 80.0–100.0)
Monocytes Absolute: 0.2 10*3/uL (ref 0.1–1.0)
Monocytes Relative: 3 %
Neutro Abs: 7.6 10*3/uL (ref 1.7–7.7)
Neutrophils Relative %: 91 %
Platelets: 167 10*3/uL (ref 150–400)
RBC: 4.79 MIL/uL (ref 4.22–5.81)
RDW: 12.5 % (ref 11.5–15.5)
WBC: 8.4 10*3/uL (ref 4.0–10.5)
nRBC: 0 % (ref 0.0–0.2)

## 2021-05-22 LAB — D-DIMER, QUANTITATIVE: D-Dimer, Quant: 1.84 ug/mL-FEU — ABNORMAL HIGH (ref 0.00–0.50)

## 2021-05-22 LAB — MAGNESIUM: Magnesium: 2.1 mg/dL (ref 1.7–2.4)

## 2021-05-22 LAB — FERRITIN: Ferritin: 160 ng/mL (ref 24–336)

## 2021-05-22 LAB — C-REACTIVE PROTEIN: CRP: 1.2 mg/dL — ABNORMAL HIGH (ref <1.0)

## 2021-05-22 LAB — PHOSPHORUS: Phosphorus: 4.5 mg/dL (ref 2.5–4.6)

## 2021-05-22 MED ORDER — ENOXAPARIN SODIUM 30 MG/0.3ML IJ SOSY
30.0000 mg | PREFILLED_SYRINGE | INTRAMUSCULAR | Status: DC
  Administered 2021-05-23: 30 mg via SUBCUTANEOUS
  Filled 2021-05-22: qty 0.3

## 2021-05-22 MED ORDER — SACCHAROMYCES BOULARDII 250 MG PO CAPS
250.0000 mg | ORAL_CAPSULE | Freq: Two times a day (BID) | ORAL | Status: DC
  Administered 2021-05-22 – 2021-05-23 (×3): 250 mg via ORAL
  Filled 2021-05-22 (×2): qty 1

## 2021-05-22 MED ORDER — SODIUM CHLORIDE 0.9 % IV SOLN: INTRAVENOUS | Status: AC

## 2021-05-22 MED ORDER — ONDANSETRON HCL 4 MG/2ML IJ SOLN
4.0000 mg | Freq: Four times a day (QID) | INTRAMUSCULAR | Status: DC | PRN
  Administered 2021-05-22: 4 mg via INTRAVENOUS
  Filled 2021-05-22: qty 2

## 2021-05-22 MED ORDER — PANTOPRAZOLE SODIUM 40 MG PO TBEC
40.0000 mg | DELAYED_RELEASE_TABLET | Freq: Two times a day (BID) | ORAL | Status: DC
  Administered 2021-05-22 – 2021-05-23 (×2): 40 mg via ORAL
  Filled 2021-05-22 (×2): qty 1

## 2021-05-22 NOTE — Final Progress Note (Signed)
PROGRESS NOTE    Daniel Barker  [id number] DOB: 08-12-1946 DOA: 05/18/2021 PCP: [name], MD    Chief Complaint  Patient presents with   Shortness of Breath    Brief admission narrative:  Daniel Barker is a 75 y.o. male with medical history significant for  asthma, COPD, chronic hypoxic respiratory failure on 2 L of oxygen,, chronic atrial fibrillation, BPH, CKD stage IIIa, CAD who presents to the emergency department due to 1 day onset of worsening shortness of breath.  He complained of mild productive cough which was clear in nature.  Patient has had to increase O2 sat of 4 LPM due to increasing work of breathing.  He denies fever, chest pain, leg swelling, nausea or vomiting   ED Course:  In the emergency department, he was hemodynamically stable, though intermittently tachypneic.  Work-up in the ED showed normal CBC, mild hyponatremia, ABG showed hypoxia and hypercapnia.  Influenza A and B was negative.  SARS coronavirus 2 was positive. Chest x-ray showed no active disease, but showed emphysema and mild bronchitis changes. Breathing treatment was provided, Solu-Medrol was given, patient was started on IV remdesivir.  Hospitalist was asked to admit patient for further evaluation and management.   Assessment & Plan: 1-Acute exacerbation of chronic obstructive pulmonary disease (COPD) ([place]) -Continue treatment with steroids, bronchodilators, Zithromax, flutter valve and incentive spirometer. -Continue to wean off oxygen supplementation as tolerated -Continue treatment for COVID.  2-acute on chronic respiratory failure with hypoxia in the setting of COPD exacerbation and COVID infection -Continue treatment with steroids, bronchodilators, supportive care and oxygen supplementation. -As mentioned above will try to wean off to room air as tolerated  3-COVID-19 infection -Continue to follow inflammatory markers -Continue vitamin C and zinc -Follow inflammatory markers -Day 5  out of 5 of remdesivir -Continue steroids. -Patient is improving. -Flutter valve/incentive respirometer has been encouraged.  4-BPH (benign prostatic hyperplasia) -Continue Flomax and Cardura -No complaints of urinary retention currently.  5-history of chronic atrial fibrillation -Not chronically on anticoagulation -Rate controlled -Continue the use of Plavix.  6-chronic kidney disease a stage IIIa -Appears to be stable; but his creatinine level is a slightly higher today. -Continue to follow renal function trend to assess stability. -Minimize the use of nephrotoxic agents, avoid hypotension and maintain adequate hydration. -Gentle fluid resuscitation will be given throughout the night and repeat blood work in AM.  7-restless leg syndrome -Mirapex low-dose will be started nightly.  8-nausea/vomiting/diarrhea -Continue PPI -Florastor has been added -Gentle fluid resuscitation overnight -Follow clinical response.  DVT prophylaxis: Lovenox Code Status: Full code. Family Communication: No family at bedside. Disposition:   Status is: Inpatient  Remains inpatient appropriate because:IV treatments appropriate due to intensity of illness or inability to take PO  Dispo: The patient is from: Home              Anticipated d/c is to: Home              Patient currently is not medically stable to d/c.   Difficult to place patient No    Consultants:  None  Procedures:  See below for x-ray reports  Antimicrobials:  Zithromax Remdesivir (5 out of 5).  Subjective: Breathing continues to improve/overall stable.  Using 2 L nasal cannula supplementation to maintain O2 sat above 92%.  Patient reported episode of nausea/vomiting and diarrhea.  Expressed that his stomach is not feeling good; he does not feel ready to go home today.  Objective: Vitals:   05/21/21 2010  05/21/21 2028 05/22/21 0542 05/22/21 1312  BP:  (!) 143/82 (!) 149/92 (!) 160/90  Pulse:  76 78 66  Resp:  '20 18  16  '$ Temp:  98.4 F (36.9 C) 97.7 F (36.5 C) 97.8 F (36.6 C)  TempSrc:  Oral Oral Oral  SpO2: 93% 93% 97% 96%  Weight:      Height:        Intake/Output Summary (Last 24 hours) at 05/22/2021 1720 Last data filed at 05/22/2021 1452 Gross per 24 hour  Intake 480 ml  Output 2300 ml  Net -1820 ml   Filed Weights   05/18/21 1535  Weight: 81 kg    Examination: General exam: Alert, awake, oriented x 3; reports improvement in his breathing; complaining of nausea/vomiting and episode of diarrhea.  Does not feel good in his stomach today.  Still requiring 2 L of oxygen supplementation Respiratory system: No wheezing or crackles; improved air movement bilaterally.  No using accessory muscles Cardiovascular system: Regular rate, no rubs, no gallops, no JVD. Gastrointestinal system: Abdomen is nondistended, soft and nontender.  Positive bowel sounds. Central nervous system: Alert and oriented. No focal neurological deficits. Extremities: No cyanosis or clubbing. Skin: No petechiae. Psychiatry: Judgement and insight appear normal. Mood & affect appropriate.    Data Reviewed: I have personally reviewed following labs and imaging studies  CBC: Recent Labs  Lab 05/18/21 1548 05/19/21 0434 05/20/21 0454 05/21/21 0625 05/22/21 0646  WBC 9.7 3.1* 5.1 7.9 8.4  NEUTROABS 7.8*  --  4.5 7.2 7.6  HGB 13.5 12.6* 13.1 14.4 14.7  HCT 42.3 39.6 40.2 45.3 46.5  MCV 97.7 97.5 95.5 96.8 97.1  PLT 165 154 166 173 [id number]    Basic Metabolic Panel: Recent Labs  Lab 05/18/21 1548 05/19/21 0434 05/20/21 0454 05/21/21 0625 05/22/21 0646  NA 133* 134* 136 138 140  K 4.2 5.0 3.7 3.8 3.3*  CL 93* 96* 89* 84* 85*  CO2 32 31 36* 43* 42*  GLUCOSE 96 132* 137* 178* 154*  BUN 15 18 38* 51* 64*  CREATININE 1.27* 1.35* 1.59* 2.01* 2.39*  CALCIUM 9.3 9.0 8.9 9.0 8.8*  MG  --  1.8 1.9 1.9 2.1  PHOS  --  4.1 3.0 3.3 4.5    GFR: Estimated Creatinine Clearance: 27.7 mL/min (A) (by C-G formula based on  SCr of 2.39 mg/dL (H)).  Liver Function Tests: Recent Labs  Lab 05/18/21 1548 05/19/21 0434 05/20/21 0454 05/21/21 0625 05/22/21 0646  AST '20 18 17 19 23  '$ ALT '13 12 14 17 22  '$ ALKPHOS 68 62 67 62 58  BILITOT 0.9 0.8 0.7 0.7 1.0  PROT 6.8 6.0* 6.2* 6.1* 6.0*  ALBUMIN 3.9 3.4* 3.4* 3.6 3.4*    CBG: No results for input(s): GLUCAP in the last 168 hours.   Recent Results (from the past 240 hour(s))  Resp Panel by RT-PCR (Flu A&B, Covid) Nasopharyngeal Swab     Status: Abnormal   Collection Time: 05/18/21  3:47 PM   Specimen: Nasopharyngeal Swab; Nasopharyngeal(NP) swabs in vial transport medium  Result Value Ref Range Status   SARS Coronavirus 2 by RT PCR POSITIVE (A) NEGATIVE Final    Comment: RESULT CALLED TO, READ BACK BY AND VERIFIED WITH: DAVIS,N @ [id number] ON 05/18/21 BY JUW (NOTE) SARS-CoV-2 target nucleic acids are DETECTED.  The SARS-CoV-2 RNA is generally detectable in upper respiratory specimens during the acute phase of infection. Positive results are indicative of the presence of the identified virus, but do not  rule out bacterial infection or co-infection with other pathogens not detected by the test. Clinical correlation with patient history and other diagnostic information is necessary to determine patient infection status. The expected result is Negative.  Fact Sheet for Patients: EntrepreneurPulse.com.au  Fact Sheet for Healthcare Providers: IncredibleEmployment.be  This test is not yet approved or cleared by the [place] FDA and  has been authorized for detection and/or diagnosis of SARS-CoV-2 by FDA under an Emergency Use Authorization (EUA).  This EUA will remain in effect (meaning this test can be  used) for the duration of  the COVID-19 declaration under Section 564(b)(1) of the Act, 21 U.S.C. section 360bbb-3(b)(1), unless the authorization is terminated or revoked sooner.     Influenza A by PCR NEGATIVE  NEGATIVE Final   Influenza B by PCR NEGATIVE NEGATIVE Final    Comment: (NOTE) The Xpert Xpress SARS-CoV-2/FLU/RSV plus assay is intended as an aid in the diagnosis of influenza from Nasopharyngeal swab specimens and should not be used as a sole basis for treatment. Nasal washings and aspirates are unacceptable for Xpert Xpress SARS-CoV-2/FLU/RSV testing.  Fact Sheet for Patients: EntrepreneurPulse.com.au  Fact Sheet for Healthcare Providers: IncredibleEmployment.be  This test is not yet approved or cleared by the [place] FDA and has been authorized for detection and/or diagnosis of SARS-CoV-2 by FDA under an Emergency Use Authorization (EUA). This EUA will remain in effect (meaning this test can be used) for the duration of the COVID-19 declaration under Section 564(b)(1) of the Act, 21 U.S.C. section 360bbb-3(b)(1), unless the authorization is terminated or revoked.  Performed at [facility], [address] [place], [place] [address]      Radiology Studies: No results found.   Scheduled Meds:  vitamin C  500 mg Oral Daily   azithromycin  250 mg Oral Daily   clopidogrel  75 mg Oral Daily   dextromethorphan-guaiFENesin  1 tablet Oral BID   [START ON 05/23/2021] enoxaparin (LOVENOX) injection  30 mg Subcutaneous Q24H   methylPREDNISolone (SOLU-MEDROL) injection  40 mg Intravenous Q8H   mometasone-formoterol  2 puff Inhalation BID   montelukast  10 mg Oral QHS   pantoprazole  40 mg Oral Daily   pramipexole  0.25 mg Oral QHS   propafenone  225 mg Oral Q8H   saccharomyces boulardii  250 mg Oral BID   tamsulosin  0.4 mg Oral Daily   zinc sulfate  220 mg Oral Daily   Continuous Infusions:  sodium chloride       LOS: 4 days    Time spent: 30 minutes    Barton Dubois, MD [facility]   To contact the attending provider between 7A-7P or the covering provider during after hours 7P-7A, please log into the web site  [email or web address] and access using universal [place] password for that web site. If you do not have the password, please call the hospital operator.  05/22/2021, 5:20 PM

## 2021-05-23 DIAGNOSIS — N1832 Chronic kidney disease, stage 3b: Secondary | ICD-10-CM

## 2021-05-23 LAB — CBC WITH DIFFERENTIAL/PLATELET
Abs Immature Granulocytes: 0.04 10*3/uL (ref 0.00–0.07)
Basophils Absolute: 0 10*3/uL (ref 0.0–0.1)
Basophils Relative: 0 %
Eosinophils Absolute: 0 10*3/uL (ref 0.0–0.5)
Eosinophils Relative: 0 %
HCT: 46.4 % (ref 39.0–52.0)
Hemoglobin: 14.7 g/dL (ref 13.0–17.0)
Immature Granulocytes: 1 %
Lymphocytes Relative: 4 %
Lymphs Abs: 0.3 10*3/uL — ABNORMAL LOW (ref 0.7–4.0)
MCH: 31.1 pg (ref 26.0–34.0)
MCHC: 31.7 g/dL (ref 30.0–36.0)
MCV: 98.1 fL (ref 80.0–100.0)
Monocytes Absolute: 0.1 10*3/uL (ref 0.1–1.0)
Monocytes Relative: 2 %
Neutro Abs: 6 10*3/uL (ref 1.7–7.7)
Neutrophils Relative %: 93 %
Platelets: 140 10*3/uL — ABNORMAL LOW (ref 150–400)
RBC: 4.73 MIL/uL (ref 4.22–5.81)
RDW: 12.5 % (ref 11.5–15.5)
WBC: 6.4 10*3/uL (ref 4.0–10.5)
nRBC: 0 % (ref 0.0–0.2)

## 2021-05-23 LAB — COMPREHENSIVE METABOLIC PANEL
ALT: 27 U/L (ref 0–44)
AST: 24 U/L (ref 15–41)
Albumin: 3.3 g/dL — ABNORMAL LOW (ref 3.5–5.0)
Alkaline Phosphatase: 54 U/L (ref 38–126)
Anion gap: 11 (ref 5–15)
BUN: 74 mg/dL — ABNORMAL HIGH (ref 8–23)
CO2: 43 mmol/L — ABNORMAL HIGH (ref 22–32)
Calcium: 8.1 mg/dL — ABNORMAL LOW (ref 8.9–10.3)
Chloride: 87 mmol/L — ABNORMAL LOW (ref 98–111)
Creatinine, Ser: 2.54 mg/dL — ABNORMAL HIGH (ref 0.61–1.24)
GFR, Estimated: 26 mL/min — ABNORMAL LOW (ref >60)
Glucose, Bld: 173 mg/dL — ABNORMAL HIGH (ref 70–99)
Potassium: 3.2 mmol/L — ABNORMAL LOW (ref 3.5–5.1)
Sodium: 141 mmol/L (ref 135–145)
Total Bilirubin: 0.8 mg/dL (ref 0.3–1.2)
Total Protein: 5.8 g/dL — ABNORMAL LOW (ref 6.5–8.1)

## 2021-05-23 LAB — D-DIMER, QUANTITATIVE: D-Dimer, Quant: 1.99 ug/mL-FEU — ABNORMAL HIGH (ref 0.00–0.50)

## 2021-05-23 LAB — FERRITIN: Ferritin: 150 ng/mL (ref 24–336)

## 2021-05-23 LAB — PHOSPHORUS: Phosphorus: 6.5 mg/dL — ABNORMAL HIGH (ref 2.5–4.6)

## 2021-05-23 LAB — C-REACTIVE PROTEIN: CRP: 0.9 mg/dL (ref <1.0)

## 2021-05-23 LAB — MAGNESIUM: Magnesium: 2.1 mg/dL (ref 1.7–2.4)

## 2021-05-23 MED ORDER — GUAIFENESIN-DM 100-10 MG/5ML PO SYRP
5.0000 mL | ORAL_SOLUTION | ORAL | 0 refills | Status: DC | PRN
Start: 2021-05-23 — End: 2021-08-12

## 2021-05-23 MED ORDER — PANTOPRAZOLE SODIUM 40 MG PO TBEC: 40.0000 mg | DELAYED_RELEASE_TABLET | Freq: Two times a day (BID) | ORAL | 1 refills | Status: DC

## 2021-05-23 MED ORDER — PREDNISONE 20 MG PO TABS: ORAL_TABLET | ORAL | 0 refills | Status: DC

## 2021-05-23 MED ORDER — ZINC SULFATE 220 (50 ZN) MG PO CAPS: 220.0000 mg | ORAL_CAPSULE | Freq: Every day | ORAL | 1 refills | Status: AC

## 2021-05-23 MED ORDER — ASCORBIC ACID 500 MG PO TABS: 500.0000 mg | ORAL_TABLET | Freq: Every day | ORAL | 1 refills | Status: DC

## 2021-05-23 MED ORDER — SACCHAROMYCES BOULARDII 250 MG PO CAPS: 250.0000 mg | ORAL_CAPSULE | Freq: Two times a day (BID) | ORAL | 0 refills | Status: AC

## 2021-05-23 NOTE — Discharge Summary (Signed)
Physician Discharge Summary  Daniel Barker [id number] DOB: 1946-01-31 DOA: 05/18/2021  PCP: [name], MD  Admit date: 05/18/2021 Discharge date: 05/23/2021  Time spent: 35 minutes  Recommendations for Outpatient Follow-up:  Repeat basic metabolic panel to follow electrolytes and renal function. Repeat chest x-ray in the 6-8 weeks to assure complete resolution of infiltrates.  Discharge Diagnoses:  Principal Problem:   Acute exacerbation of chronic obstructive pulmonary disease (COPD) ([place]) Active Problems:   BPH (benign prostatic hyperplasia)   Acute on chronic respiratory failure with hypoxia (H[facility])   COVID-19 virus infection   Acute on chronic respiratory failure with hypoxia and hypercapnia ([facility])   Atrial fibrillation, chronic ([facility]) Acute on chronic chronic kidney disease (stage IIIa-IIIb at baseline).   Acute dyspnea   Discharge Condition: Stable and improved.  Discharged home with instruction to follow-up with PCP and cardiology service as an outpatient.  CODE STATUS: Full code  Diet recommendation: Heart healthy diet.  Filed Weights   05/18/21 1535  Weight: 81 kg    History of present illness:  Daniel Barker is a 75 y.o. male with medical history significant for  asthma, COPD, chronic hypoxic respiratory failure on 2 L of oxygen,, chronic atrial fibrillation, BPH, CKD stage IIIa, CAD who presents to the emergency department due to 1 day onset of worsening shortness of breath.  He complained of mild productive cough which was clear in nature.  Patient has had to increase O2 sat of 4 LPM due to increasing work of breathing.  He denies fever, chest pain, leg swelling, nausea or vomiting   ED Course:  In the emergency department, he was hemodynamically stable, though intermittently tachypneic.  Work-up in the ED showed normal CBC, mild hyponatremia, ABG showed hypoxia and hypercapnia.  Influenza A and B was negative.  SARS coronavirus 2 was positive. Chest x-ray  showed no active disease, but showed emphysema and mild bronchitis changes. Breathing treatment was provided, Solu-Medrol was given, patient was started on IV remdesivir.  Hospitalist was asked to admit patient for further evaluation and management.  Hospital Course:  1-Acute exacerbation of chronic obstructive pulmonary disease (COPD) ([place]) -Continue treatment with steroids and bronchodilators. -Continue to wean off oxygen supplementation as tolerated -Continue treatment for COVID.   2-acute on chronic respiratory failure with hypoxia in the setting of COPD exacerbation and COVID infection -Continue treatment with steroids, bronchodilators, supportive care and oxygen supplementation. -As mentioned above will try to wean off to room air as tolerated back to his baseline.   3-COVID-19 infection -Inflammatory markers demonstrating improvement. -Continue vitamin C and zinc -Patient has completed 5 out of 5 doses of remdesivir -Continue steroids tapering and bronchodilators -Advise to continue the use of Flutter valve/incentive respirometer  -Continue to wean oxygen supplementation back to his baseline.   4-BPH (benign prostatic hyperplasia) -Continue Flomax and Cardura -No complaints of urinary retention currently. -Advised to maintain adequate hydration.   5-history of chronic atrial fibrillation -Not chronically on anticoagulation -Rate controlled -Continue the use of Plavix. -Continue outpatient follow-up with cardiology service.   6-acute on chronic kidney disease a stage IIIa-IIIb -Patient chronically in transition point from the stage III 8 to a stage IIIb. -Patient reports good urine output; minimize nephrotoxic agents and maintain adequate hydration. -Repeat basic metabolic panel to follow renal function trend and instability.  7-restless leg syndrome -Mirapex low-dose will be started nightly.   8-nausea/vomiting/diarrhea -Continue PPI -Florastor has been added -Gentle  fluid resuscitation overnight -Follow clinical response.  9-OSA -continue CPAP nightly.  Procedures: See below for x-ray reports.  Consultations: None  Discharge Exam: Vitals:   05/23/21 0810 05/23/21 1534  BP:  127/78  Pulse:  83  Resp:    Temp:  97.9 F (36.6 C)  SpO2: 95% 95%    General: No fever, no chest pain, no nausea, no vomiting.  Patient feeling ready to go home.  Good saturation on 1-2 L nasal cannula supplementation. Cardiovascular: Rate controlled, no rubs, no gallops, no JVD. Respiratory: Improved air movement bilaterally; no wheezing or crackles on examination.  No using accessory muscles. Abdomen: Soft, nontender, positive bowel sounds Extremities: No cyanosis or clubbing.  Discharge Instructions   Discharge Instructions     Discharge instructions   Complete by: As directed    Continue to follow the 3 W's (wear a mask, wait 6 feet apart and wash hands frequently). Maintain adequate hydration Take medications as prescribed Arrange follow-up with PCP in 2 weeks Follow heart healthy diet   Increase activity slowly   Complete by: As directed       Allergies as of 05/23/2021       Reactions   Lipitor [atorvastatin]         Medication List     TAKE these medications    acetaminophen 500 MG tablet Commonly known as: TYLENOL Take 1,000 mg by mouth every 6 (six) hours as needed.   albuterol (2.5 MG/3ML) 0.083% nebulizer solution Commonly known as: PROVENTIL INHALE THE CONTENTS OF 1 VIAL VIA NEBULIZER EVERY 6 HOURS AS NEEDED FOR WHEEZING OR SHORTNESS OF BREATH   albuterol 108 (90 Base) MCG/ACT inhaler Commonly known as: VENTOLIN HFA USE 2 PUFFS BY MOUTH EVERY 4 HOURS AS NEEDED   ascorbic acid 500 MG tablet Commonly known as: VITAMIN C Take 1 tablet (500 mg total) by mouth daily. Start taking on: May 24, 2021   budesonide-formoterol 160-4.5 MCG/ACT inhaler Commonly known as: Symbicort Take 2 puffs first thing in am and then another 2  puffs about 12 hours later.   clopidogrel 75 MG tablet Commonly known as: PLAVIX Take 75 mg by mouth daily.   fenofibrate micronized 134 MG capsule Commonly known as: LOFIBRA Take 134 mg by mouth daily before breakfast.   guaiFENesin-dextromethorphan 100-10 MG/5ML syrup Commonly known as: ROBITUSSIN DM Take 5 mLs by mouth every 4 (four) hours as needed for cough.   montelukast 10 MG tablet Commonly known as: SINGULAIR Take 10 mg by mouth at bedtime.   pantoprazole 40 MG tablet Commonly known as: PROTONIX Take 1 tablet (40 mg total) by mouth 2 (two) times daily. What changed: when to take this   predniSONE 20 MG tablet Commonly known as: DELTASONE Take 3 tablets by mouth daily x1 day; then 2 tablet by mouth daily x2 days; then 1 tablet by mouth daily x3 days; then half tablet by mouth daily x3 days and stop prednisone.   propafenone 225 MG tablet Commonly known as: RYTHMOL Take 1 tablet (225 mg total) by mouth every 8 (eight) hours.   propranolol 40 MG tablet Commonly known as: INDERAL Take 40 mg by mouth 2 (two) times daily.   saccharomyces boulardii 250 MG capsule Commonly known as: FLORASTOR Take 1 capsule (250 mg total) by mouth 2 (two) times daily for 20 doses.   tamsulosin 0.4 MG Caps capsule Commonly known as: FLOMAX Take 1 capsule (0.4 mg total) by mouth daily.   torsemide 100 MG tablet Commonly known as: DEMADEX Take 0.5 tablets (50 mg total) by mouth daily.  zinc sulfate 220 (50 Zn) MG capsule Take 1 capsule (220 mg total) by mouth daily. Start taking on: May 24, 2021       Allergies  Allergen Reactions   Lipitor [Atorvastatin]     Follow-up Information     [name], MD. Schedule an appointment as soon as possible for a visit in 2 week(s).   Specialty: Internal Medicine Contact information: [address] [place] [facility] [address] [phone]         Josue Hector, MD .   Specialty: Cardiology Contact information: [phone] N.  [address] [place] [address] [phone]                 The results of significant diagnostics from this hospitalization (including imaging, microbiology, ancillary and laboratory) are listed below for reference.    Significant Diagnostic Studies: DG Chest Portable 1 View  Result Date: 05/18/2021 CLINICAL DATA:  Shortness of breath EXAM: PORTABLE CHEST 1 VIEW COMPARISON:  04/05/2021, CT 04/05/2021 FINDINGS: Post sternotomy changes. Emphysematous disease and mild bronchitic change. No focal opacity or pleural effusion. Stable cardiomediastinal silhouette with aortic atherosclerosis. No pneumothorax. IMPRESSION: No active disease.  Emphysema and mild bronchitic changes Electronically Signed   By: Donavan Foil M.D.   On: 05/18/2021 16:03    Microbiology: Recent Results (from the past 240 hour(s))  Resp Panel by RT-PCR (Flu A&B, Covid) Nasopharyngeal Swab     Status: Abnormal   Collection Time: 05/18/21  3:47 PM   Specimen: Nasopharyngeal Swab; Nasopharyngeal(NP) swabs in vial transport medium  Result Value Ref Range Status   SARS Coronavirus 2 by RT PCR POSITIVE (A) NEGATIVE Final    Comment: RESULT CALLED TO, READ BACK BY AND VERIFIED WITH: DAVIS,N @ [id number] ON 05/18/21 BY JUW (NOTE) SARS-CoV-2 target nucleic acids are DETECTED.  The SARS-CoV-2 RNA is generally detectable in upper respiratory specimens during the acute phase of infection. Positive results are indicative of the presence of the identified virus, but do not rule out bacterial infection or co-infection with other pathogens not detected by the test. Clinical correlation with patient history and other diagnostic information is necessary to determine patient infection status. The expected result is Negative.  Fact Sheet for Patients: EntrepreneurPulse.com.au  Fact Sheet for Healthcare Providers: IncredibleEmployment.be  This test is not yet approved or cleared by  the [place] FDA and  has been authorized for detection and/or diagnosis of SARS-CoV-2 by FDA under an Emergency Use Authorization (EUA).  This EUA will remain in effect (meaning this test can be  used) for the duration of  the COVID-19 declaration under Section 564(b)(1) of the Act, 21 U.S.C. section 360bbb-3(b)(1), unless the authorization is terminated or revoked sooner.     Influenza A by PCR NEGATIVE NEGATIVE Final   Influenza B by PCR NEGATIVE NEGATIVE Final    Comment: (NOTE) The Xpert Xpress SARS-CoV-2/FLU/RSV plus assay is intended as an aid in the diagnosis of influenza from Nasopharyngeal swab specimens and should not be used as a sole basis for treatment. Nasal washings and aspirates are unacceptable for Xpert Xpress SARS-CoV-2/FLU/RSV testing.  Fact Sheet for Patients: EntrepreneurPulse.com.au  Fact Sheet for Healthcare Providers: IncredibleEmployment.be  This test is not yet approved or cleared by the [place] FDA and has been authorized for detection and/or diagnosis of SARS-CoV-2 by FDA under an Emergency Use Authorization (EUA). This EUA will remain in effect (meaning this test can be used) for the duration of the COVID-19 declaration under Section  564(b)(1) of the Act, 21 U.S.C. section 360bbb-3(b)(1), unless the authorization is terminated or revoked.  Performed at [facility], [address], [place], [place] [address]      Labs: Basic Metabolic Panel: Recent Labs  Lab 05/19/21 0434 05/20/21 0454 05/21/21 0625 05/22/21 0646 05/23/21 0628  NA 134* 136 138 140 141  K 5.0 3.7 3.8 3.3* 3.2*  CL 96* 89* 84* 85* 87*  CO2 31 36* 43* 42* 43*  GLUCOSE 132* 137* 178* 154* 173*  BUN 18 38* 51* 64* 74*  CREATININE 1.35* 1.59* 2.01* 2.39* 2.54*  CALCIUM 9.0 8.9 9.0 8.8* 8.1*  MG 1.8 1.9 1.9 2.1 2.1  PHOS 4.1 3.0 3.3 4.5 6.5*   Liver Function Tests: Recent Labs  Lab 05/19/21 0434 05/20/21 0454  05/21/21 0625 05/22/21 0646 05/23/21 0628  AST '18 17 19 23 24  '$ ALT '12 14 17 22 27  '$ ALKPHOS 62 67 62 58 54  BILITOT 0.8 0.7 0.7 1.0 0.8  PROT 6.0* 6.2* 6.1* 6.0* 5.8*  ALBUMIN 3.4* 3.4* 3.6 3.4* 3.3*   Recent Labs  Lab 05/18/21 1548  LIPASE 31   CBC: Recent Labs  Lab 05/18/21 1548 05/19/21 0434 05/20/21 0454 05/21/21 0625 05/22/21 0646 05/23/21 0628  WBC 9.7 3.1* 5.1 7.9 8.4 6.4  NEUTROABS 7.8*  --  4.5 7.2 7.6 6.0  HGB 13.5 12.6* 13.1 14.4 14.7 14.7  HCT 42.3 39.6 40.2 45.3 46.5 46.4  MCV 97.7 97.5 95.5 96.8 97.1 98.1  PLT 165 154 166 173 167 140*   BNP (last 3 results) Recent Labs    04/05/21 1847 05/18/21 1548  BNP 190.0* 241.0*    Signed:  Barton Dubois MD.  [facility] 05/23/2021, 4:27 PM

## 2021-05-23 NOTE — Progress Notes (Signed)
Nsg Discharge Note  Admit Date:  05/18/2021 Discharge date: 05/23/2021   [name] to be D/C'd Home per MD order.  AVS completed.  Copy for chart, and copy for patient signed, and dated. Patient/caregiver able to verbalize understanding.  Discharge Medication: Allergies as of 05/23/2021       Reactions   Lipitor [atorvastatin]         Medication List     TAKE these medications    acetaminophen 500 MG tablet Commonly known as: TYLENOL Take 1,000 mg by mouth every 6 (six) hours as needed.   albuterol (2.5 MG/3ML) 0.083% nebulizer solution Commonly known as: PROVENTIL INHALE THE CONTENTS OF 1 VIAL VIA NEBULIZER EVERY 6 HOURS AS NEEDED FOR WHEEZING OR SHORTNESS OF BREATH   albuterol 108 (90 Base) MCG/ACT inhaler Commonly known as: VENTOLIN HFA USE 2 PUFFS BY MOUTH EVERY 4 HOURS AS NEEDED   ascorbic acid 500 MG tablet Commonly known as: VITAMIN C Take 1 tablet (500 mg total) by mouth daily. Start taking on: May 24, 2021   budesonide-formoterol 160-4.5 MCG/ACT inhaler Commonly known as: Symbicort Take 2 puffs first thing in am and then another 2 puffs about 12 hours later.   clopidogrel 75 MG tablet Commonly known as: PLAVIX Take 75 mg by mouth daily.   fenofibrate micronized 134 MG capsule Commonly known as: LOFIBRA Take 134 mg by mouth daily before breakfast.   guaiFENesin-dextromethorphan 100-10 MG/5ML syrup Commonly known as: ROBITUSSIN DM Take 5 mLs by mouth every 4 (four) hours as needed for cough.   montelukast 10 MG tablet Commonly known as: SINGULAIR Take 10 mg by mouth at bedtime.   pantoprazole 40 MG tablet Commonly known as: PROTONIX Take 1 tablet (40 mg total) by mouth 2 (two) times daily. What changed: when to take this   predniSONE 20 MG tablet Commonly known as: DELTASONE Take 3 tablets by mouth daily x1 day; then 2 tablet by mouth daily x2 days; then 1 tablet by mouth daily x3 days; then half tablet by mouth daily x3 days and stop  prednisone.   propafenone 225 MG tablet Commonly known as: RYTHMOL Take 1 tablet (225 mg total) by mouth every 8 (eight) hours.   propranolol 40 MG tablet Commonly known as: INDERAL Take 40 mg by mouth 2 (two) times daily.   saccharomyces boulardii 250 MG capsule Commonly known as: FLORASTOR Take 1 capsule (250 mg total) by mouth 2 (two) times daily for 20 doses.   tamsulosin 0.4 MG Caps capsule Commonly known as: FLOMAX Take 1 capsule (0.4 mg total) by mouth daily.   torsemide 100 MG tablet Commonly known as: DEMADEX Take 0.5 tablets (50 mg total) by mouth daily.   zinc sulfate 220 (50 Zn) MG capsule Take 1 capsule (220 mg total) by mouth daily. Start taking on: May 24, 2021        Discharge Assessment: Vitals:   05/23/21 0810 05/23/21 1534  BP:  127/78  Pulse:  83  Resp:    Temp:  97.9 F (36.6 C)  SpO2: 95% 95%   Skin clean, dry and intact without evidence of skin break down, no evidence of skin tears noted. IV catheter discontinued intact. Site without signs and symptoms of complications - no redness or edema noted at insertion site, patient denies c/o pain - only slight tenderness at site.  Dressing with slight pressure applied.  D/c Instructions-Education: Discharge instructions given to patient/family with verbalized understanding. D/c education completed with patient/family including follow up instructions, medication  list, d/c activities limitations if indicated, with other d/c instructions as indicated by MD - patient able to verbalize understanding, all questions fully answered. Patient instructed to return to ED, call 911, or call MD for any changes in condition.  Patient escorted via [place], and D/C home via private auto.  Dorcas Mcmurray, LPN [id number] PM

## 2021-05-24 ENCOUNTER — Other Ambulatory Visit: Payer: Self-pay

## 2021-05-24 DIAGNOSIS — J441 Chronic obstructive pulmonary disease with (acute) exacerbation: Secondary | ICD-10-CM

## 2021-05-25 ENCOUNTER — Ambulatory Visit: Payer: Medicare Other | Admitting: Internal Medicine
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
## 2021-05-28 ENCOUNTER — Other Ambulatory Visit: Payer: Self-pay | Admitting: *Deleted

## 2021-05-28 NOTE — Patient Outreach (Signed)
[place] [facility]) Care Management  05/28/2021  Daniel Barker September 04, 1946 [id number]   [facility] Unsuccessful outreach to post hospital referred patient  Referral Date: 05/25/21 Referral Source: Referral Reason: Insurance: 6/21-26 copd exc afib covidd Outreach attempt to the home number  No answer. [facility] RN CM left HIPAA [facility] Portability and Accountability Act) compliant voicemail message along with CM's contact info.   Plan: [facility] RN CM scheduled this patient for another call attempt within 4-7 business days Sent unsuccessful letter   Joelene Millin L. Lavina Hamman, RN, BSN, [place] Coordinator Office number [phone] Mobile number [phone]  Main [facility] number [phone] Fax number [phone]

## 2021-06-02 ENCOUNTER — Other Ambulatory Visit: Payer: Self-pay | Admitting: *Deleted

## 2021-06-02 ENCOUNTER — Other Ambulatory Visit: Payer: Self-pay
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
## 2021-06-05 ENCOUNTER — Encounter (HOSPITAL_COMMUNITY): Payer: Self-pay | Admitting: Emergency Medicine

## 2021-06-05 ENCOUNTER — Emergency Department (HOSPITAL_COMMUNITY): Payer: Medicare Other

## 2021-06-05 ENCOUNTER — Other Ambulatory Visit: Payer: Self-pay

## 2021-06-05 ENCOUNTER — Inpatient Hospital Stay (HOSPITAL_COMMUNITY)
Admission: EM | Admit: 2021-06-05 | Discharge: 2021-06-08 | DRG: 190 | Disposition: A | Discharge status: Home / Self Care | Payer: Medicare Other | Attending: Internal Medicine | Admitting: Internal Medicine

## 2021-06-05 DIAGNOSIS — U071 COVID-19: Secondary | ICD-10-CM | POA: Diagnosis present

## 2021-06-05 DIAGNOSIS — Z825 Family history of asthma and other chronic lower respiratory diseases: Secondary | ICD-10-CM

## 2021-06-05 DIAGNOSIS — I129 Hypertensive chronic kidney disease with stage 1 through stage 4 chronic kidney disease, or unspecified chronic kidney disease: Secondary | ICD-10-CM | POA: Diagnosis present

## 2021-06-05 DIAGNOSIS — Z7951 Long term (current) use of inhaled steroids: Secondary | ICD-10-CM | POA: Diagnosis not present

## 2021-06-05 DIAGNOSIS — N400 Enlarged prostate without lower urinary tract symptoms: Secondary | ICD-10-CM | POA: Diagnosis present

## 2021-06-05 DIAGNOSIS — Z79899 Other long term (current) drug therapy: Secondary | ICD-10-CM | POA: Diagnosis not present

## 2021-06-05 DIAGNOSIS — J441 Chronic obstructive pulmonary disease with (acute) exacerbation: Secondary | ICD-10-CM

## 2021-06-05 DIAGNOSIS — R7989 Other specified abnormal findings of blood chemistry: Secondary | ICD-10-CM | POA: Diagnosis present

## 2021-06-05 DIAGNOSIS — K219 Gastro-esophageal reflux disease without esophagitis: Secondary | ICD-10-CM | POA: Diagnosis present

## 2021-06-05 DIAGNOSIS — N1831 Chronic kidney disease, stage 3a: Secondary | ICD-10-CM | POA: Diagnosis not present

## 2021-06-05 DIAGNOSIS — I2510 Atherosclerotic heart disease of native coronary artery without angina pectoris: Secondary | ICD-10-CM | POA: Diagnosis not present

## 2021-06-05 DIAGNOSIS — Z87891 Personal history of nicotine dependence: Secondary | ICD-10-CM

## 2021-06-05 DIAGNOSIS — J9621 Acute and chronic respiratory failure with hypoxia: Secondary | ICD-10-CM | POA: Diagnosis present

## 2021-06-05 DIAGNOSIS — Z951 Presence of aortocoronary bypass graft: Secondary | ICD-10-CM | POA: Diagnosis not present

## 2021-06-05 DIAGNOSIS — Z7902 Long term (current) use of antithrombotics/antiplatelets: Secondary | ICD-10-CM

## 2021-06-05 DIAGNOSIS — N401 Enlarged prostate with lower urinary tract symptoms: Secondary | ICD-10-CM | POA: Diagnosis present

## 2021-06-05 DIAGNOSIS — I482 Chronic atrial fibrillation, unspecified: Secondary | ICD-10-CM | POA: Diagnosis present

## 2021-06-05 DIAGNOSIS — Z8249 Family history of ischemic heart disease and other diseases of the circulatory system: Secondary | ICD-10-CM | POA: Diagnosis not present

## 2021-06-05 DIAGNOSIS — Z888 Allergy status to other drugs, medicaments and biological substances status: Secondary | ICD-10-CM

## 2021-06-05 DIAGNOSIS — I480 Paroxysmal atrial fibrillation: Secondary | ICD-10-CM | POA: Diagnosis present

## 2021-06-05 DIAGNOSIS — R609 Edema, unspecified: Secondary | ICD-10-CM | POA: Diagnosis not present

## 2021-06-05 DIAGNOSIS — E7800 Pure hypercholesterolemia, unspecified: Secondary | ICD-10-CM | POA: Diagnosis not present

## 2021-06-05 DIAGNOSIS — D696 Thrombocytopenia, unspecified: Secondary | ICD-10-CM | POA: Diagnosis not present

## 2021-06-05 DIAGNOSIS — R0602 Shortness of breath: Secondary | ICD-10-CM | POA: Diagnosis not present

## 2021-06-05 DIAGNOSIS — R778 Other specified abnormalities of plasma proteins: Secondary | ICD-10-CM | POA: Diagnosis present

## 2021-06-05 DIAGNOSIS — J9690 Respiratory failure, unspecified, unspecified whether with hypoxia or hypercapnia: Secondary | ICD-10-CM | POA: Diagnosis not present

## 2021-06-05 DIAGNOSIS — J449 Chronic obstructive pulmonary disease, unspecified: Secondary | ICD-10-CM | POA: Diagnosis not present

## 2021-06-05 DIAGNOSIS — R079 Chest pain, unspecified: Secondary | ICD-10-CM | POA: Diagnosis not present

## 2021-06-05 DIAGNOSIS — J439 Emphysema, unspecified: Secondary | ICD-10-CM | POA: Diagnosis not present

## 2021-06-05 LAB — PROCALCITONIN: Procalcitonin: 0.11 ng/mL

## 2021-06-05 LAB — MAGNESIUM: Magnesium: 1.9 mg/dL (ref 1.7–2.4)

## 2021-06-05 LAB — CBC
HCT: 40.4 % (ref 39.0–52.0)
Hemoglobin: 12.5 g/dL — ABNORMAL LOW (ref 13.0–17.0)
MCH: 30.5 pg (ref 26.0–34.0)
MCHC: 30.9 g/dL (ref 30.0–36.0)
MCV: 98.5 fL (ref 80.0–100.0)
Platelets: 135 10*3/uL — ABNORMAL LOW (ref 150–400)
RBC: 4.1 MIL/uL — ABNORMAL LOW (ref 4.22–5.81)
RDW: 12.9 % (ref 11.5–15.5)
WBC: 21.3 10*3/uL — ABNORMAL HIGH (ref 4.0–10.5)
nRBC: 0 % (ref 0.0–0.2)

## 2021-06-05 LAB — BASIC METABOLIC PANEL
Anion gap: 6 (ref 5–15)
BUN: 20 mg/dL (ref 8–23)
CO2: 33 mmol/L — ABNORMAL HIGH (ref 22–32)
Calcium: 9 mg/dL (ref 8.9–10.3)
Chloride: 101 mmol/L (ref 98–111)
Creatinine, Ser: 1.33 mg/dL — ABNORMAL HIGH (ref 0.61–1.24)
GFR, Estimated: 56 mL/min — ABNORMAL LOW (ref >60)
Glucose, Bld: 110 mg/dL — ABNORMAL HIGH (ref 70–99)
Potassium: 3.6 mmol/L (ref 3.5–5.1)
Sodium: 140 mmol/L (ref 135–145)

## 2021-06-05 LAB — TROPONIN I (HIGH SENSITIVITY)
Troponin I (High Sensitivity): 30 ng/L — ABNORMAL HIGH (ref <18)
Troponin I (High Sensitivity): 28 ng/L — ABNORMAL HIGH (ref <18)

## 2021-06-05 LAB — D-DIMER, QUANTITATIVE: D-Dimer, Quant: 0.74 ug/mL-FEU — ABNORMAL HIGH (ref 0.00–0.50)

## 2021-06-05 LAB — BRAIN NATRIURETIC PEPTIDE: B Natriuretic Peptide: 261.9 pg/mL — ABNORMAL HIGH (ref 0.0–100.0)

## 2021-06-05 MED ORDER — TAMSULOSIN HCL 0.4 MG PO CAPS
0.4000 mg | ORAL_CAPSULE | Freq: Every day | ORAL | Status: DC
  Administered 2021-06-05 – 2021-06-08 (×4): 0.4 mg via ORAL
  Filled 2021-06-05 (×4): qty 1

## 2021-06-05 MED ORDER — SODIUM CHLORIDE 0.9 % IV SOLN
500.0000 mg | INTRAVENOUS | Status: DC
  Administered 2021-06-05 – 2021-06-06 (×2): 500 mg via INTRAVENOUS
  Filled 2021-06-05 (×3): qty 500

## 2021-06-05 MED ORDER — IPRATROPIUM-ALBUTEROL 0.5-2.5 (3) MG/3ML IN SOLN
3.0000 mL | Freq: Four times a day (QID) | RESPIRATORY_TRACT | Status: DC
  Administered 2021-06-05 – 2021-06-06 (×2): 3 mL via RESPIRATORY_TRACT
  Filled 2021-06-05 (×3): qty 3

## 2021-06-05 MED ORDER — METHYLPREDNISOLONE SODIUM SUCC 40 MG IJ SOLR
40.0000 mg | Freq: Four times a day (QID) | INTRAMUSCULAR | Status: DC
  Administered 2021-06-05 – 2021-06-06 (×3): 40 mg via INTRAVENOUS
  Filled 2021-06-05 (×3): qty 1

## 2021-06-05 MED ORDER — MONTELUKAST SODIUM 10 MG PO TABS
10.0000 mg | ORAL_TABLET | Freq: Every day | ORAL | Status: DC
  Administered 2021-06-05 – 2021-06-07 (×3): 10 mg via ORAL
  Filled 2021-06-05 (×4): qty 1

## 2021-06-05 MED ORDER — ACETAMINOPHEN 650 MG RE SUPP: 650.0000 mg | Freq: Four times a day (QID) | RECTAL | Status: DC | PRN

## 2021-06-05 MED ORDER — IPRATROPIUM BROMIDE 0.02 % IN SOLN
0.5000 mg | Freq: Once | RESPIRATORY_TRACT | Status: AC
  Filled 2021-06-05: qty 2.5

## 2021-06-05 MED ORDER — PROPRANOLOL HCL 40 MG PO TABS
40.0000 mg | ORAL_TABLET | Freq: Two times a day (BID) | ORAL | Status: DC
  Administered 2021-06-05 – 2021-06-08 (×6): 40 mg via ORAL
  Filled 2021-06-05 (×7): qty 1

## 2021-06-05 MED ORDER — ALBUTEROL SULFATE (2.5 MG/3ML) 0.083% IN NEBU
INHALATION_SOLUTION | RESPIRATORY_TRACT | Status: AC
  Administered 2021-06-05: 5 mg via RESPIRATORY_TRACT
  Filled 2021-06-05: qty 3

## 2021-06-05 MED ORDER — PANTOPRAZOLE SODIUM 40 MG PO TBEC
40.0000 mg | DELAYED_RELEASE_TABLET | Freq: Two times a day (BID) | ORAL | Status: DC
  Administered 2021-06-05 – 2021-06-08 (×6): 40 mg via ORAL
  Filled 2021-06-05 (×6): qty 1

## 2021-06-05 MED ORDER — FENOFIBRATE 54 MG PO TABS
54.0000 mg | ORAL_TABLET | Freq: Every day | ORAL | Status: DC
  Administered 2021-06-06 – 2021-06-08 (×3): 54 mg via ORAL
  Filled 2021-06-05 (×3): qty 1

## 2021-06-05 MED ORDER — ALBUTEROL SULFATE (2.5 MG/3ML) 0.083% IN NEBU: 2.5000 mg | INHALATION_SOLUTION | RESPIRATORY_TRACT | Status: DC | PRN

## 2021-06-05 MED ORDER — ACETAMINOPHEN 325 MG PO TABS: 650.0000 mg | ORAL_TABLET | Freq: Four times a day (QID) | ORAL | Status: DC | PRN

## 2021-06-05 MED ORDER — IPRATROPIUM BROMIDE 0.02 % IN SOLN
RESPIRATORY_TRACT | Status: AC
  Administered 2021-06-05: 0.5 mg via RESPIRATORY_TRACT
  Filled 2021-06-05: qty 2.5

## 2021-06-05 MED ORDER — CLOPIDOGREL BISULFATE 75 MG PO TABS
75.0000 mg | ORAL_TABLET | Freq: Every day | ORAL | Status: DC
  Administered 2021-06-06 – 2021-06-08 (×3): 75 mg via ORAL
  Filled 2021-06-05 (×3): qty 1

## 2021-06-05 MED ORDER — ALBUTEROL SULFATE (2.5 MG/3ML) 0.083% IN NEBU
5.0000 mg | INHALATION_SOLUTION | Freq: Once | RESPIRATORY_TRACT | Status: AC
  Filled 2021-06-05: qty 6

## 2021-06-05 MED ORDER — TORSEMIDE 20 MG PO TABS
50.0000 mg | ORAL_TABLET | Freq: Every day | ORAL | Status: DC
  Administered 2021-06-06 – 2021-06-08 (×3): 50 mg via ORAL
  Filled 2021-06-05 (×3): qty 1

## 2021-06-05 NOTE — H&P (Signed)
History and Physical    PLEASE NOTE THAT DRAGON DICT[employer] SOFTWARE WAS USED IN THE CONSTRUCTION OF THIS NOTE.   Daniel Barker [id number] DOB: 1946-05-09 DOA: 06/05/2021  PCP: [name], MD Patient coming from: home   I have personally briefly reviewed patient's old medical records in [place]  Chief Complaint: Shortness of breath  HPI: Daniel Barker is a 75 y.o. male with medical history significant for chronic hypoxic respiratory failure on continuous 2 L nasal cannula in the setting of severe COPD, chronic atrial fibrillation, not chronically anticoagulated, stage IIIa chronic kidney disease with baseline creatinine 1.3-1.5, coronary artery disease status post CABG, who is admitted to [facility] on 06/05/2021 with acute COPD exacerbation after presenting from home to [facility] ED complaining of shortness of breath.   The patient was recently hospitalized from 05/18/2021 to 05/23/2021 for COVID-19 infection.  He was discharged home on 05/23/2021 on a 9-day prednisone taper.  For the first 7 to 10 days following discharge from the hospital, the patient reports that his breathing was significantly improved relative to that with which he had presented at the time of his COVID-19 diagnosis on 05/18/2021.  However, over the course of the last week, the patient reports new, progressive shortness of breath associated with mildly productive cough.  Denies any associated orthopnea or PND.  While on the aforementioned prednisone taper, the patient reports that he developed mild edema in the bilateral lower extremities, which has been subsequently improving following conclusion of this prednisone taper, noting only mild residual edema at this time.  Denies any associated calf tenderness or new onset lower extremity erythema.  Denies any recent hemoptysis.  Over the course of the last week, in the setting of this progressive shortness of breath, the patient conveys that he has been more frequently  using his as needed rescue albuterol inhaler, without significant improvement in his associated breathing.  He also denies any recent chest pain, diaphoresis, palpitations, nausea, vomiting, dizziness, presyncope, or syncope.  No recent wheezing.  In November 2021, right around Thanksgiving, the patient reports that he tripped over the garden hose at home, causing him to fall and strike his head on his motor vehicle.  He conveys that this fall is associated with an acute left upper extremity fracture.  Otherwise, he denies any recent trauma.  Denies any recent travel or personal history of DVT/PE.  No recent melena or hematochezia.  Over the course of the last week, he denies any associated subjective fever, chills, rigors, or generalized myalgias.  No recent headache, neck stiffness, rhinitis, rhinorrhea, sore throat, abdominal pain, diarrhea, or rash.  He also denies any recent dysuria, gross hematuria, or change in urinary urgency/frequency.  Patient acknowledges a history of COPD for which she is on 2 L continuous nasal cannula.  He confirms that he is a former smoker, having completely quit smoking in 2021 after previously smoking 1 pack/day over the preceding 60 years.  Denies any ensuing resumption of smoking.  Reports good compliance with his home respiratory regimen which consists of Symbicort, as needed albuterol, and Singulair.  Per chart review, patient also has a documented history of chronic atrial fibrillation for which she follows with Dr. [name] as his outpatient cardiologist.  Per chart review, there is documentation that the patient has elected to not pursue formal anticoagulation in the setting of his history of atrial fibrillation.  Patient denies any known history of chronic heart failure, although I have not yet located any prior  echocardiogram results per initial chart review.     ED Course:  Vital signs in the ED were notable for the following: Tetramex 98.2, heart rate 83-88;  blood pressure 105/58 -122/85; respiratory rate 21-25; oxygen saturation 96 100% on baseline 2 L nasal cannula.  Labs were notable for the following: BMP was notable for the following: Sodium 140, potassium 3.6, bicarbonate 33, creatinine 1.33 relative to most recent prior value of 2.54 on 05/23/2021.  BNP 262 relative to most recent prior value of 241 on 05/18/2021.  High-sensitivity troponin I initially noted to be 30, with repeat value trending down to 28, which is relative to most recent prior value of 9 when checked on 04/05/2021.  CBC 21,000 relative to 6.4 on 05/23/2021, hemoglobin 12.5.  EKG, by way of comparison to most recent prior from 05/19/2021 demonstrates atrial fibrillation with ventricular rate 91, normal intervals, no evidence of T wave changes, less than 1 mm ST elevation limited to v2, Q waves in V1/V2, which appears unchanged relative to most recent prior EKG, as well as potential Q waves in leads I and aVL, which appear new relative to most recent prior EKG.  Chest x-ray shows no evidence of acute cardiopulmonary process, including no evidence of infiltrate, edema, pleural effusion, or pneumothorax.  While in the ED, the following were administered: Duo nebulizer treatment x1.  Socially, the patient was admitted to the med telemetry floor for further evaluation management of suspected presenting acute COPD exacerbation.    Review of Systems: As per HPI otherwise 10 point review of systems negative.   Past Medical History:  Diagnosis Date   Asthma    Atrial fibrillation (HCC)    BPH (benign prostatic hyperplasia)    Cigarette smoker    CKD (chronic kidney disease)    COPD (chronic obstructive pulmonary disease) (HCC)    Coronary artery disease    Cough    High cholesterol    Hx of CABG    Hypercholesteremia    Hypertension    Localized edema    Stented coronary artery     Past Surgical History:  Procedure Laterality Date   CARDIAC SURGERY     CORONARY ARTERY BYPASS  GRAFT      Social History:  reports that he quit smoking about 9 months ago. His smoking use included cigarettes. He has a 60.00 pack-year smoking history. He has never used smokeless tobacco. He reports that he does not drink alcohol and does not use drugs.   Allergies  Allergen Reactions   Lipitor [Atorvastatin]     Family History  Problem Relation Age of Onset   Hypertension Mother    Clotting disorder Mother    COPD Father     Family history reviewed and not pertinent    Prior to Admission medications   Medication Sig Start Date End Date Taking? Authorizing Provider  acetaminophen (TYLENOL) 500 MG tablet Take 1,000 mg by mouth every 6 (six) hours as needed.   Yes [provider]  albuterol (PROVENTIL) (2.5 MG/3ML) 0.083% nebulizer solution INHALE THE CONTENTS OF 1 VIAL VIA NEBULIZER EVERY 6 HOURS AS NEEDED FOR WHEEZING OR SHORTNESS OF BREATH Patient taking differently: Take 2.5 mg by nebulization every 6 (six) hours as needed. INHALE THE CONTENTS OF 1 VIAL VIA NEBULIZER EVERY 6 HOURS AS NEEDED FOR WHEEZING OR SHORTNESS OF BREATH 04/19/21  Yes Tanda Rockers, MD  albuterol (VENTOLIN HFA) 108 (90 Base) MCG/ACT inhaler USE 2 PUFFS BY MOUTH EVERY 4 HOURS AS NEEDED  Patient taking differently: Inhale 2 puffs into the lungs every 4 (four) hours as needed. USE 2 PUFFS BY MOUTH EVERY 4 HOURS AS NEEDED 05/04/21  Yes Tanda Rockers, MD  ascorbic acid (VITAMIN C) 500 MG tablet Take 1 tablet (500 mg total) by mouth daily. 05/24/21  Yes Barton Dubois, MD  budesonide-formoterol [facility]) 160-4.5 MCG/ACT inhaler Take 2 puffs first thing in am and then another 2 puffs about 12 hours later. 10/27/20  Yes Tanda Rockers, MD  clopidogrel (PLAVIX) 75 MG tablet Take 75 mg by mouth daily.   Yes [provider]  fenofibrate micronized (LOFIBRA) 134 MG capsule Take 134 mg by mouth daily before breakfast.   Yes [provider]  guaiFENesin-dextromethorphan (ROBITUSSIN DM)  100-10 MG/5ML syrup Take 5 mLs by mouth every 4 (four) hours as needed for cough. 05/23/21  Yes Barton Dubois, MD  montelukast (SINGULAIR) 10 MG tablet Take 10 mg by mouth at bedtime.   Yes [provider]  pantoprazole (PROTONIX) 40 MG tablet Take 1 tablet (40 mg total) by mouth 2 (two) times daily. 05/23/21  Yes Barton Dubois, MD  predniSONE (DELTASONE) 20 MG tablet Take 3 tablets by mouth daily x1 day; then 2 tablet by mouth daily x2 days; then 1 tablet by mouth daily x3 days; then half tablet by mouth daily x3 days and stop prednisone. 05/23/21  Yes Barton Dubois, MD  propafenone (RYTHMOL) 225 MG tablet Take 1 tablet (225 mg total) by mouth every 8 (eight) hours. 02/03/21  Yes Josue Hector, MD  propranolol (INDERAL) 40 MG tablet Take 40 mg by mouth 2 (two) times daily. 05/05/21  Yes [provider]  tamsulosin (FLOMAX) 0.4 MG CAPS capsule Take 1 capsule (0.4 mg total) by mouth daily. 07/08/20  Yes McKenzie, Candee Furbish, MD  torsemide (DEMADEX) 100 MG tablet Take 0.5 tablets (50 mg total) by mouth daily. 04/08/21  Yes Emokpae, Courage, MD  zinc sulfate 220 (50 Zn) MG capsule Take 1 capsule (220 mg total) by mouth daily. 05/24/21  Yes Barton Dubois, MD     Objective    Physical Exam: Vitals:   06/05/21 1715 06/05/21 1800 06/05/21 1828 06/05/21 1830  BP: 103/79 122/85  114/62  Pulse: 88 83  87  Resp: 17 (!) 21  20  Temp:      TempSrc:      SpO2: 97% 96% 99% 100%    General: appears to be stated age; alert, oriented; mildly increased work of breathing noted Skin: warm, dry, no rash Head:  AT/[place] Mouth:  Oral mucosa membranes appear moist, normal dentition Neck: supple; trachea midline Heart:  RRR; did not appreciate any M/R/G Lungs: CTAB, did not appreciate any wheezes, rales, or rhonchi Abdomen: + BS; soft, ND, NT Vascular: 2+ pedal pulses b/l; 2+ radial pulses b/l Extremities: Trace edema in bilateral lower extremities; no muscle wasting Neuro: strength and sensation  intact in upper and lower extremities b/l    Labs on Admission: I have personally reviewed following labs and imaging studies  CBC: Recent Labs  Lab 06/05/21 1527  WBC 21.3*  HGB 12.5*  HCT 40.4  MCV 98.5  PLT [id number]*   Basic Metabolic Panel: Recent Labs  Lab 06/05/21 1527  NA 140  K 3.6  CL 101  CO2 33*  GLUCOSE 110*  BUN 20  CREATININE 1.33*  CALCIUM 9.0   GFR: CrCl cannot be calculated (Unknown ideal weight.). Liver Function Tests: No results for input(s): AST, ALT, ALKPHOS, BILITOT, PROT, ALBUMIN  in the last 168 hours. No results for input(s): LIPASE, AMYLASE in the last 168 hours. No results for input(s): AMMONIA in the last 168 hours. Coagulation Profile: No results for input(s): INR, PROTIME in the last 168 hours. Cardiac Enzymes: No results for input(s): CKTOTAL, CKMB, CKMBINDEX, TROPONINI in the last 168 hours. BNP (last 3 results) No results for input(s): PROBNP in the last 8760 hours. HbA1C: No results for input(s): HGBA1C in the last 72 hours. CBG: No results for input(s): GLUCAP in the last 168 hours. Lipid Profile: No results for input(s): CHOL, HDL, LDLCALC, TRIG, CHOLHDL, LDLDIRECT in the last 72 hours. Thyroid Function Tests: No results for input(s): TSH, T4TOTAL, FREET4, T3FREE, THYROIDAB in the last 72 hours. Anemia Panel: No results for input(s): VITAMINB12, FOLATE, FERRITIN, TIBC, IRON, RETICCTPCT in the last 72 hours. Urine analysis:    Component Value Date/Time   COLORURINE YELLOW 03/28/2020 1601   APPEARANCEUR HAZY (A) 03/28/2020 1601   LABSPEC 1.009 03/28/2020 1601   PHURINE 7.0 03/28/2020 1601   GLUCOSEU NEGATIVE 03/28/2020 1601   HGBUR MODERATE (A) 03/28/2020 1601   BILIRUBINUR NEGATIVE 03/28/2020 1601   KETONESUR NEGATIVE 03/28/2020 1601   PROTEINUR NEGATIVE 03/28/2020 1601   NITRITE NEGATIVE 03/28/2020 1601   LEUKOCYTESUR MODERATE (A) 03/28/2020 1601    Radiological Exams on Admission: DG Chest 2 View  Result Date:  06/05/2021 CLINICAL DATA:  Chest pain EXAM: CHEST - 2 VIEW COMPARISON:  May 18, 2021 FINDINGS: Prior median sternotomy. The heart size and mediastinal contours are within normal limits. Aortic atherosclerosis. Pulmonary hyperinflation with apical predominant emphysematous change and chronic bronchitic changes. No new focal consolidation. No pleural effusion. No pneumothorax. Thoracic spondylosis. IMPRESSION: 1. No active cardiopulmonary disease. 2. Chronic changes of COPD. 3. Aortic Atherosclerosis (ICD10-I70.0) and Emphysema (ICD10-J43.9). Electronically Signed   By: Dahlia Bailiff MD   On: 06/05/2021 15:49     EKG: Independently reviewed, with result as described above.    Assessment/Plan   Daniel Barker is a 75 y.o. male with medical history significant for chronic hypoxic respiratory failure on continuous 2 L nasal cannula in the setting of severe COPD, chronic atrial fibrillation, not chronically anticoagulated, stage IIIa chronic kidney disease with baseline creatinine 1.3-1.5, coronary artery disease status post CABG, who is admitted to [facility] on 06/05/2021 with acute COPD exacerbation after presenting from home to [facility] ED complaining of shortness of breath.    Principal Problem:   Acute exacerbation of chronic obstructive pulmonary disease (COPD) (HCC) Active Problems:   BPH (benign prostatic hyperplasia)   Atrial fibrillation, chronic (HCC)   SOB (shortness of breath)   Elevated troponin   Chronic kidney disease, stage 3a (HCC)     #) Acute COPD exacerbation: In the context of documented history of chronic hypoxic respiratory failure on 2 L continuous nasal cannula in the setting of severe COPD, presentation appears to be consistent with acute COPD exacerbation in the setting of 1 week of progressive shortness of breath associated with new onset mildly productive cough, with chest x-ray showing no evidence of acute cardiopulmonary process, including no evidence of infiltrate,  edema, effusion, or pneumothorax.  Of note, the patient was recently hospitalized for COVID-19 infection, with associated positive COVID test on 05/18/2021.  In the context of his report of initial improvement in the respiratory symptoms that he had been experiencing at the time of initial COVID-19 diagnosis, for subsequent development of progressive shortness of breath prompting today's presentation, it appears less likely that his presenting symptoms  today are on the basis of residual COVID-19 infection.  However, given this timeframe and sequence of improvement in respiratory symptoms followed by ensuing worsening shortness of breath, there is increased risk for development of a secondary bacterial pneumonia.  While chest x-ray shows no evidence of acute infiltrate to suggest pneumonia at this time, will add on procalcitonin level to further evaluate the possibility of underlying secondary bacterial pneumonia.  Consider the possibility acutely decompensated heart failure however, this appears to be less likely at this time given no elevation of BNP relative to most recent prior value, as further quantified above, as well as less likely in the absence of any evidence of associated radiographic findings on presenting chest x-ray, and given patient's report of recent decline in peripheral edema as his shortness of breath increased following completion of a 9-day course of steroid taper following discharge from the hospital, which appears to indicate resolving peripheral edema as a consequence of the aforementioned systemic corticosteroids, and possibly suggesting steroid dependence in the setting of severe COPD given ensuing worsening of the patient's respiratory status following completion of this taper.  Additionally, given recent COVID-19 infection along with notes hypercoagulable state, will check D-dimer as a screening for pulmonary embolism, which, if positive, will consider pursuit of CTA of the chest to  further evaluate for any contribution from acute pulmonary embolism relative presenting shortness of breath.  Of note, presentation associate with any chest pain, and presenting ACS is felt to be less likely.  However, given mildly elevated troponin, will which has been trending down, in the context of apparent interval development of Q waves in the lateral leads relative to most recent prior EKG from 05/19/2021, will also check echocardiogram to evaluate for any focal wall motion abnormalities, to evaluate for any subacute coronary ischemia relative to EKG from 05/19/2021.  Of note, patient confirms that he is a former smoker, and reports good compliance with his home respiratory regimen, as further detailed above, with suggestive of an element of significant bronchodilator response/asthma given inclusion of Singulair as a component of this regimen.  Plan: Scheduled duo nebulizer treatments.  As needed albuterol nebulizer.  Solu-Medrol 40 mg IV every 6 hours.  Azithromycin for benefit of reduction in duration of hospitalization in the setting of acute COPD exacerbation requiring hospitalization as well as for associated anti-inflammatory benefits.  Add on procalcitonin, as above.  Check VBG.  Flutter valve and incentive spirometer have been ordered.  Check D-dimer, as above.  Monitor on telemetry.  Monitor continuous pulse oximetry.  Echocardiogram has been ordered, as above.      #) Recent COVID-19 infection: Recent hospitalized from 05/18/2021 to 05/23/2021 for COVID-19 infection following positive COVID-19 test on 05/18/2021.  As this most recent positive COVID test was greater than 18 days ago, with interval initial improvements in the patient's respiratory symptoms, there is not appear to be an indication at this time for airborne/contact precautions.  Additionally, given this recent positive test result, there does not appear to be an indication for retesting for COVID-19 at this time, given the high  likelihood of residual positive test result.   Plan: Repeat CBC in the morning.  Monitor on continuous pulse oximetry.  Refraining from contact/airborne precautions as well as refraining from repeat COVID-19 testing, with rationale as above.      #) Elevated troponin: Mildly elevated initial troponin at 30, with repeat value trending down to 28, which is relative to most recent prior high-sensitivity troponin I value of 9  on 04/05/2021.  While presenting ACS is felt to be less likely, in the absence of any recent chest pain, and with EKG showing no evidence of T wave or ST changes, will evaluate for subacute coronary ischemia that may have occurred in the interval following EKG on 05/19/2021 given the presence of interval development of possible Q waves in the lateral leads, as further detailed above.  Differential for patient's elevated troponin also includes type II supply demand mismatch in the setting of presenting acute COPD exacerbation as well as potential contribution from diminished, but improving, renal clearance given development of, now resolved AKI, in the interval following most recent prior troponin value on 04/05/2021, with finding of serum creatinine value of 2.54 on 05/23/2021.  Will also check d - dimer to screen for pulmonary embolism given recent preliminary state associated with recent COVID-19 diagnosis.  Plan: Monitor on telemetry.  Monitor continuous pulse oximetry.  Echocardiogram has been ordered for the morning.  D-dimer, as above.  Further evaluation management of suspected presenting acute COPD exacerbation, as above.       #) Chronic atrial fibrillation: documented history of chronic atrial fibrillation for which he follows with Dr. [name] as his outpatient cardiologist.  Per chart review, there is documentation that the patient has elected to not pursue formal anticoagulation in the setting of his history of atrial fibrillation, although the specific rationale is not  currently clear to me. I have not yet located any prior echocardiogram results per initial chart review, although will continue to search for prior results of the additional review of care everywhere.  Appears to be in rate control atrial fibrillation at this time.   Plan: Monitor on telemetry given increased risk for development of atrial fibrillation with RVR in the setting of presenting acute COPD exacerbation.  Add on serum magnesium level.  Repeat BMP and CBC in the morning.  Monitor continuous pulse oximetry.      #) BPH: On Flomax as an outpatient.  Plan: Continue home tamsulosin.  Monitor strict I's and O's no weights.  Repeat BMP in the morning.      #) Stage IIIa chronic kidney disease: Documented history of such and associated baseline creatinine range 1.3-1.5 following recent resolution of acute kidney injury, as further described above, presenting serum creatinine found to be within baseline range.  Plan: Monitor strict I's and O's Daily weights.  Repeat BMP in the morning.      #) Hyperlipidemia: On fenofibrate as an outpatient.  Plan: Continue home fenofibrate.     #) GERD: On Protonix as an outpatient.  Plan: Continue home PPI.       DVT prophylaxis: SCDs Code Status: Full code Family Communication: none Disposition Plan: Per Rounding Team Consults called: none  Admission status: Inpatient; med telemetry     Of note, this patient was added by me to the following Admit List/Treatment Team: [email or web address].      PLEASE NOTE THAT DRAGON DICTATION SOFTWARE WAS USED IN THE CONSTRUCTION OF THIS NOTE.   [place] [facility] Pager [phone] From [place]  Otherwise, please contact night-coverage  [email or web address] Password [email or web address]   06/05/2021, 7:07 PM

## 2021-06-05 NOTE — Progress Notes (Signed)
Brief note regarding preliminary plan, with full H&P to follow:  75 year old male with history of chronic hypoxic respiratory failure in the setting of COPD on 2 L continuous nasal cannula, who is admitted with acute COPD exacerbation after presenting with 2 to 3 days of progressive shortness of breath associated with new onset cough.  Of note, was hospitalized last month for COVID-19, with associated positive COVID test on 05/18/2021.  He reports subsequent improvement in the respiratory symptoms he was experiencing following that positive COVID test.  Therefore, does not warrant additional COVID-19 testing at this time nor airborne precautions.  Scheduled duo nebulizer treatments, as needed albuterol nebulizers, Solu-Medrol, azithromycin.  Check VBG.  We will also check procalcitonin, as presentation warrants evaluation for secondary bacterial pneumonia given the above history and sequence of symptoms.  Maintaining oxygen saturations in the mid to high 90s on baseline 2 L nasal cannula.    Babs Bertin, DO Hospitalist

## 2021-06-05 NOTE — ED Provider Notes (Signed)
[facility] EMERGENCY DEPARTMENT Provider Note   CSN: [id number] Arrival date & time: 06/05/21  1441     History Chief Complaint  Patient presents with   Chest Pain   Shortness of Breath    Daniel Barker is a 75 y.o. male.   Chest Pain Associated symptoms: cough, fatigue and shortness of breath   Associated symptoms: no abdominal pain, no back pain, no fever and no weakness   Shortness of Breath Associated symptoms: chest pain, cough and wheezing   Associated symptoms: no abdominal pain, no fever and no rash   Patient presents with shortness of breath.  Has had for the last few days.  Cough with some mild sputum production.  States the breathing gets better with the coughing when he produces something.  Wears oxygen at night.  Has had to wear more frequently.  Recent admission to the hospital for COPD and COVID.  Discharged just under 2 weeks ago.  States he been doing well for 3 days and then got worse.  States he also has more swelling of his legs again.  States that has resolved.  No fevers or chills.  States the pain is in his anterior chest and worse when he coughs.    Past Medical History:  Diagnosis Date   Asthma    Atrial fibrillation (HCC)    BPH (benign prostatic hyperplasia)    Cigarette smoker    CKD (chronic kidney disease)    COPD (chronic obstructive pulmonary disease) (HCC)    Coronary artery disease    Cough    High cholesterol    Hx of CABG    Hypercholesteremia    Hypertension    Localized edema    Stented coronary artery     Patient Active Problem List   Diagnosis Date Noted   Acute dyspnea    Acute on chronic respiratory failure with hypoxia and hypercapnia ([place]) 05/19/2021   Atrial fibrillation, chronic ([place]) 05/19/2021   Chronic kidney disease 05/19/2021   COVID-19 virus infection 05/18/2021   Acute exacerbation of chronic obstructive pulmonary disease (COPD) ([place]) 04/05/2021   Closed displaced spiral fracture of shaft of  left humerus 12/23/2020   Acute on chronic respiratory failure with hypoxia ([place]) 09/02/2020   Urinary retention 07/08/2020   BPH (benign prostatic hyperplasia) 07/08/2020   COPD GOLD IV / group D  05/28/2020   Cigarette smoker 05/28/2020   AKI (acute kidney injury) ([place]) 06/03/2014   Anemia 06/03/2014   CAD (coronary atherosclerotic disease) 06/03/2014   Acute respiratory failure with hypercapnia ([place]) 06/01/2014   Diabetes mellitus ([place]) 06/01/2014   Hypertension 06/01/2014    Past Surgical History:  Procedure Laterality Date   CARDIAC SURGERY     CORONARY ARTERY BYPASS GRAFT         Family History  Problem Relation Age of Onset   Hypertension Mother    Clotting disorder Mother    COPD Father     Social History   Tobacco Use   Smoking status: Former    Packs/day: 1.00    Years: 60.00    Pack years: 60.00    Types: Cigarettes    Quit date: 09/01/2020    Years since quitting: 0.7   Smokeless tobacco: Never  Vaping Use   Vaping Use: Never used  Substance Use Topics   Alcohol use: Never   Drug use: Never    Home Medications Prior to Admission medications   Medication Sig Start Date End Date Taking? Authorizing Provider  acetaminophen (TYLENOL) 500 MG tablet Take 1,000 mg by mouth every 6 (six) hours as needed.   Yes [provider]  albuterol (PROVENTIL) (2.5 MG/3ML) 0.083% nebulizer solution INHALE THE CONTENTS OF 1 VIAL VIA NEBULIZER EVERY 6 HOURS AS NEEDED FOR WHEEZING OR SHORTNESS OF BREATH Patient taking differently: Take 2.5 mg by nebulization every 6 (six) hours as needed. INHALE THE CONTENTS OF 1 VIAL VIA NEBULIZER EVERY 6 HOURS AS NEEDED FOR WHEEZING OR SHORTNESS OF BREATH 04/19/21  Yes Tanda Rockers, MD  albuterol (VENTOLIN HFA) 108 (90 Base) MCG/ACT inhaler USE 2 PUFFS BY MOUTH EVERY 4 HOURS AS NEEDED Patient taking differently: Inhale 2 puffs into the lungs every 4 (four) hours as needed. USE 2 PUFFS BY MOUTH EVERY 4 HOURS AS NEEDED 05/04/21  Yes  Tanda Rockers, MD  ascorbic acid (VITAMIN C) 500 MG tablet Take 1 tablet (500 mg total) by mouth daily. 05/24/21  Yes Barton Dubois, MD  budesonide-formoterol [facility]) 160-4.5 MCG/ACT inhaler Take 2 puffs first thing in am and then another 2 puffs about 12 hours later. 10/27/20  Yes Tanda Rockers, MD  clopidogrel (PLAVIX) 75 MG tablet Take 75 mg by mouth daily.   Yes [provider]  fenofibrate micronized (LOFIBRA) 134 MG capsule Take 134 mg by mouth daily before breakfast.   Yes [provider]  guaiFENesin-dextromethorphan (ROBITUSSIN DM) 100-10 MG/5ML syrup Take 5 mLs by mouth every 4 (four) hours as needed for cough. 05/23/21  Yes Barton Dubois, MD  montelukast (SINGULAIR) 10 MG tablet Take 10 mg by mouth at bedtime.   Yes [provider]  pantoprazole (PROTONIX) 40 MG tablet Take 1 tablet (40 mg total) by mouth 2 (two) times daily. 05/23/21  Yes Barton Dubois, MD  predniSONE (DELTASONE) 20 MG tablet Take 3 tablets by mouth daily x1 day; then 2 tablet by mouth daily x2 days; then 1 tablet by mouth daily x3 days; then half tablet by mouth daily x3 days and stop prednisone. 05/23/21  Yes Barton Dubois, MD  propafenone (RYTHMOL) 225 MG tablet Take 1 tablet (225 mg total) by mouth every 8 (eight) hours. 02/03/21  Yes Josue Hector, MD  propranolol (INDERAL) 40 MG tablet Take 40 mg by mouth 2 (two) times daily. 05/05/21  Yes [provider]  tamsulosin (FLOMAX) 0.4 MG CAPS capsule Take 1 capsule (0.4 mg total) by mouth daily. 07/08/20  Yes McKenzie, Candee Furbish, MD  torsemide (DEMADEX) 100 MG tablet Take 0.5 tablets (50 mg total) by mouth daily. 04/08/21  Yes Emokpae, Courage, MD  zinc sulfate 220 (50 Zn) MG capsule Take 1 capsule (220 mg total) by mouth daily. 05/24/21  Yes Barton Dubois, MD    Allergies    Lipitor [atorvastatin]  Review of Systems   Review of Systems  Constitutional:  Positive for fatigue. Negative for appetite change and fever.  HENT:   Negative for congestion.   Respiratory:  Positive for cough, shortness of breath and wheezing.   Cardiovascular:  Positive for chest pain and leg swelling.  Gastrointestinal:  Negative for abdominal pain.  Genitourinary:  Negative for flank pain.  Musculoskeletal:  Negative for back pain.  Skin:  Negative for rash.  Neurological:  Negative for weakness.  Psychiatric/Behavioral:  Negative for confusion.    Physical Exam Updated Vital Signs BP 114/62   Pulse 87   Temp 98.2 F (36.8 C) (Oral)   Resp 20   SpO2 100%   Physical Exam Vitals and nursing note reviewed.  Cardiovascular:     Rate and Rhythm: Regular rhythm.  Pulmonary:     Effort: Tachypnea present.     Breath sounds: Wheezing and rales present.     Comments: Harsh breath sounds with both wheezes and some rales. Chest:     Chest wall: No tenderness.  Abdominal:     Tenderness: There is no abdominal tenderness.  Musculoskeletal:     Cervical back: Neck supple.     Right lower leg: Edema present.     Left lower leg: Edema present.     Comments: Mild pitting edema bilateral lower extremities.  Skin:    General: Skin is warm.  Neurological:     Mental Status: He is alert and oriented to person, place, and time.    ED Results / Procedures / Treatments   Labs (all labs ordered are listed, but only abnormal results are displayed) Labs Reviewed  BASIC METABOLIC PANEL - Abnormal; Notable for the following components:      Result Value   CO2 33 (*)    Glucose, Bld 110 (*)    Creatinine, Ser 1.33 (*)    GFR, Estimated 56 (*)    All other components within normal limits  CBC - Abnormal; Notable for the following components:   WBC 21.3 (*)    RBC 4.10 (*)    Hemoglobin 12.5 (*)    Platelets 135 (*)    All other components within normal limits  BRAIN NATRIURETIC PEPTIDE - Abnormal; Notable for the following components:   B Natriuretic Peptide 261.9 (*)    All other components within normal limits  TROPONIN I  (HIGH SENSITIVITY) - Abnormal; Notable for the following components:   Troponin I (High Sensitivity) 30 (*)    All other components within normal limits  TROPONIN I (HIGH SENSITIVITY)    EKG EKG Interpretation  Date/Time:  Saturday June 05 2021 15:27:40 EDT Ventricular Rate:  91 PR Interval:  180 QRS Duration: 108 QT Interval:  356 QTC Calculation: 437 R Axis:   111 Text Interpretation: Atrial fibrillation Septal infarct , age undetermined Lateral infarct , age undetermined Abnormal ECG Confirmed by Davonna Belling [phone]) on 06/05/2021 3:53:26 PM  Radiology DG Chest 2 View  Result Date: 06/05/2021 CLINICAL DATA:  Chest pain EXAM: CHEST - 2 VIEW COMPARISON:  May 18, 2021 FINDINGS: Prior median sternotomy. The heart size and mediastinal contours are within normal limits. Aortic atherosclerosis. Pulmonary hyperinflation with apical predominant emphysematous change and chronic bronchitic changes. No new focal consolidation. No pleural effusion. No pneumothorax. Thoracic spondylosis. IMPRESSION: 1. No active cardiopulmonary disease. 2. Chronic changes of COPD. 3. Aortic Atherosclerosis (ICD10-I70.0) and Emphysema (ICD10-J43.9). Electronically Signed   By: Dahlia Bailiff MD   On: 06/05/2021 15:49    Procedures Procedures   Medications Ordered in ED Medications  albuterol (PROVENTIL) (2.5 MG/3ML) 0.083% nebulizer solution 5 mg (5 mg Nebulization Given 06/05/21 1827)  ipratropium (ATROVENT) nebulizer solution 0.5 mg (0.5 mg Nebulization Given 06/05/21 1827)    ED Course  I have reviewed the triage vital signs and the nursing notes.  Pertinent labs & imaging results that were available during my care of the patient were reviewed by me and considered in my medical decision making (see chart for details).    MDM Rules/Calculators/A&P                          Patient presents with shortness of breath.  Over the last 3 to 4  days.  History of COPD.  Just finished up steroids for recent COVID  and COPD.  Now worsening shortness of breath.  Cough with some mild production.  Chest x-ray reassuring.  Breathing treatment given here but still some pursed lips breathing.  White count elevated but just finished steroids yesterday.  Sounds tight.  I feels the patient benefit from admission to the hospital.  Also has some edema on his legs.  BNP mildly elevated but x-ray does not show edema but does have some rales.  Will discuss with hospitalist for admission.  Will not repeat COVID test since was + 3 weeks ago. Final Clinical Impression(s) / ED Diagnoses Final diagnoses:  COPD exacerbation [facility])    Rx / DC Orders ED Discharge Orders     None        Davonna Belling, MD 06/05/21 1845

## 2021-06-05 NOTE — ED Notes (Signed)
Attempted report x1. 

## 2021-06-05 NOTE — ED Triage Notes (Signed)
Pt reports SOB and L sided chest pain x 3-4 days.  Wears 2 liters O2 at night.  Speaking in short phrases.

## 2021-06-06 ENCOUNTER — Inpatient Hospital Stay (HOSPITAL_COMMUNITY): Payer: Medicare Other

## 2021-06-06 DIAGNOSIS — J441 Chronic obstructive pulmonary disease with (acute) exacerbation: Principal | ICD-10-CM

## 2021-06-06 DIAGNOSIS — R0602 Shortness of breath: Secondary | ICD-10-CM

## 2021-06-06 DIAGNOSIS — R778 Other specified abnormalities of plasma proteins: Secondary | ICD-10-CM

## 2021-06-06 LAB — ECHOCARDIOGRAM COMPLETE
Area-P 1/2: 2.81 cm2
S' Lateral: 2.7 cm
Weight: 2476.8 oz

## 2021-06-06 LAB — MAGNESIUM: Magnesium: 1.9 mg/dL (ref 1.7–2.4)

## 2021-06-06 LAB — BLOOD GAS, VENOUS
Acid-Base Excess: 6.1 mmol/L — ABNORMAL HIGH (ref 0.0–2.0)
Bicarbonate: 31.7 mmol/L — ABNORMAL HIGH (ref 20.0–28.0)
Drawn by: 1444
O2 Saturation: 71.8 %
Patient temperature: 37
pCO2, Ven: 60.1 mmHg — ABNORMAL HIGH (ref 44.0–60.0)
pH, Ven: 7.341 (ref 7.250–7.430)
pO2, Ven: 40.6 mmHg (ref 32.0–45.0)

## 2021-06-06 LAB — COMPREHENSIVE METABOLIC PANEL
ALT: 18 U/L (ref 0–44)
AST: 14 U/L — ABNORMAL LOW (ref 15–41)
Albumin: 2.7 g/dL — ABNORMAL LOW (ref 3.5–5.0)
Alkaline Phosphatase: 46 U/L (ref 38–126)
Anion gap: 6 (ref 5–15)
BUN: 21 mg/dL (ref 8–23)
CO2: 32 mmol/L (ref 22–32)
Calcium: 8.7 mg/dL — ABNORMAL LOW (ref 8.9–10.3)
Chloride: 101 mmol/L (ref 98–111)
Creatinine, Ser: 1.47 mg/dL — ABNORMAL HIGH (ref 0.61–1.24)
GFR, Estimated: 50 mL/min — ABNORMAL LOW (ref >60)
Glucose, Bld: 260 mg/dL — ABNORMAL HIGH (ref 70–99)
Potassium: 4.3 mmol/L (ref 3.5–5.1)
Sodium: 139 mmol/L (ref 135–145)
Total Bilirubin: 0.8 mg/dL (ref 0.3–1.2)
Total Protein: 5 g/dL — ABNORMAL LOW (ref 6.5–8.1)

## 2021-06-06 LAB — PHOSPHORUS: Phosphorus: 2.5 mg/dL (ref 2.5–4.6)

## 2021-06-06 LAB — CBC
HCT: 36.5 % — ABNORMAL LOW (ref 39.0–52.0)
Hemoglobin: 11.5 g/dL — ABNORMAL LOW (ref 13.0–17.0)
MCH: 31 pg (ref 26.0–34.0)
MCHC: 31.5 g/dL (ref 30.0–36.0)
MCV: 98.4 fL (ref 80.0–100.0)
Platelets: 110 10*3/uL — ABNORMAL LOW (ref 150–400)
RBC: 3.71 MIL/uL — ABNORMAL LOW (ref 4.22–5.81)
RDW: 13.1 % (ref 11.5–15.5)
WBC: 20.5 10*3/uL — ABNORMAL HIGH (ref 4.0–10.5)
nRBC: 0 % (ref 0.0–0.2)

## 2021-06-06 LAB — SARS CORONAVIRUS 2 (TAT 6-24 HRS): SARS Coronavirus 2: POSITIVE — AB

## 2021-06-06 MED ORDER — GUAIFENESIN ER 600 MG PO TB12
600.0000 mg | ORAL_TABLET | Freq: Two times a day (BID) | ORAL | Status: DC
  Administered 2021-06-06 – 2021-06-08 (×4): 600 mg via ORAL
  Filled 2021-06-06 (×4): qty 1

## 2021-06-06 MED ORDER — TECHNETIUM TO 99M ALBUMIN AGGREGATED
4.4000 | Freq: Once | INTRAVENOUS | Status: AC | PRN
  Administered 2021-06-06: 4.4 via INTRAVENOUS

## 2021-06-06 MED ORDER — ALBUTEROL SULFATE (2.5 MG/3ML) 0.083% IN NEBU: 2.5000 mg | INHALATION_SOLUTION | Freq: Two times a day (BID) | RESPIRATORY_TRACT | Status: DC

## 2021-06-06 MED ORDER — METHYLPREDNISOLONE SODIUM SUCC 40 MG IJ SOLR
40.0000 mg | Freq: Two times a day (BID) | INTRAMUSCULAR | Status: DC
  Administered 2021-06-06 – 2021-06-08 (×4): 40 mg via INTRAVENOUS
  Filled 2021-06-06 (×4): qty 1

## 2021-06-06 NOTE — Plan of Care (Signed)
  Problem: Education: Goal: Knowledge of General Education information will improve Description Including pain rating scale, medication(s)/side effects and non-pharmacologic comfort measures Outcome: Progressing   

## 2021-06-06 NOTE — Progress Notes (Signed)
PROGRESS NOTE    Daniel Barker  [id number] DOB: 03/05/1946 DOA: 06/05/2021 PCP: [name], MD   Chief Complain:SOB, dyspnea on exertion  Brief Narrative: Patient is a 57 male with history of chronic hypoxic respiratory failure on 2 L of oxygen per minute, COPD, recent history of COVID infection, chronic A. fib not on anticoagulation, stage III CKD with baseline creatinine of 1.3-1.5, coronary disease status post CABG presented with dyspnea on exertion.  He was recently admitted and managed here from 6/21 -6/26 for COVID infection.  He was discharged home on prednisone taper.  Patient presented to the emergency department with complaints of dyspnea on exertion.  Patient was admitted for the management of acute hypoxic respiratory failure secondary to COPD exacerbation and in the setting of recent COVID infection.  Assessment & Plan:   Principal Problem:   Acute exacerbation of chronic obstructive pulmonary disease (COPD) (HCC) Active Problems:   BPH (benign prostatic hyperplasia)   Atrial fibrillation, chronic (HCC)   SOB (shortness of breath)   Elevated troponin   Chronic kidney disease, stage 3a (HCC)  Acute on chronic respiratory failure with hypoxia/dyspnea on exertion: Likely secondary to COPD exacerbation and recent history of COVID. Currently he is on baseline oxygen requirement.  There is also possibility of PE in the setting of recent COVID infection.  Due to his kidney function, we do not want to expose him with contrast.  We will check VQ scan.  If his kidney function improves by tomorrow, we might consider doing CT chest with contrast. CXR didn't show any PNA.  Acute COPD exacerbation: Continue bronchodilators, currently on baseline oxygen requirement, started on oral steroids.  Continue mucolytic's.  Also started on azithromycin.  Continue flutter valve, incentive spirometer  Recent COVID infection: Hospitalized from 6/21-6/26 for COVID infection.  Currently not on  isolation.  Complains of cough.  Leukocytosis: Most likely secondary to recent history of steroid use for COVID infection.  Continue to monitor  Elevated troponin: Mild, flat trained.  Denies any chest pain.  EKG did not show any ischemic changes.  Echocardiogram has been ordered.  Chronic A. fib: Currently mildly bradycardic.  Not on anticoagulation because patient declined.  Follows with Dr. [name], cardiology.  Monitor on telemetry.  BPH: on Flomax  Stage IIIa CKD: Baseline kidney function ranged from 1.3-1.5.  Currently kidney function at baseline.  Hyperlipidemia: On fenofibrate  GERD: Protonix  Thrombocytopenia: Mild, continue to monitor.  Elevated BNP: Patient looks euvolemic on examination.  BNP elevated.  Continue torsemide 40 mg that he takes at home.  Echo pending  Debility/deconditioning: Patient lives with stepdaughter and demented wife.  We will request for PT/OT evaluation         DVT prophylaxis:SCD Code Status:  Family Communication:: Discussed with daughter-in-law on phone. Status is: Inpatient  Remains inpatient appropriate because:IV treatments appropriate due to intensity of illness or inability to take PO  Dispo: The patient is from: Home              Anticipated d/c is to: Home              Patient currently is not medically stable to d/c.   Difficult to place patient No     Consultants: None  Procedures:None  Antimicrobials:  Anti-infectives (From admission, onward)    Start     Dose/Rate Route Frequency Ordered Stop   06/05/21 1915  azithromycin (ZITHROMAX) 500 mg in sodium chloride 0.9 % 250 mL IVPB  500 mg 250 mL/hr over 60 Minutes Intravenous Every 24 hours 06/05/21 1902         Subjective:  Patient seen and examined the bedside this morning.  Hemodynamically stable.  Comfortable, lying on the bed.  Denies any shortness of breath at rest but complains of severe dyspnea on minimal exertion.  Also bothered by  cough.   Objective: Vitals:   06/05/21 2127 06/05/21 2200 06/05/21 2300 06/06/21 0837  BP:  111/67 126/76 (!) 163/80  Pulse:  80 75 70  Resp:   18   Temp: 97.8 F (36.6 C)  98 F (36.7 C)   TempSrc: Oral  Oral   SpO2:  97% 95%   Weight:    70.2 kg    Intake/Output Summary (Last 24 hours) at 06/06/2021 1033 Last data filed at 06/06/2021 0843 Gross per 24 hour  Intake 240 ml  Output 425 ml  Net -185 ml   Filed Weights   06/06/21 0837  Weight: 70.2 kg    Examination:  General exam: Overall comfortable, not in distress HEENT: PERRL Respiratory system: Severely diminished air entry bilaterally, no wheezes or crackles Cardiovascular system: S1 & S2 heard, RRR.  Gastrointestinal system: Abdomen is nondistended, soft and nontender. Central nervous system: Alert and oriented Extremities: No edema, no clubbing ,no cyanosis Skin: No rashes, no ulcers,no icterus      Data Reviewed: I have personally reviewed following labs and imaging studies  CBC: Recent Labs  Lab 06/05/21 1527 06/06/21 0130  WBC 21.3* 20.5*  HGB 12.5* 11.5*  HCT 40.4 36.5*  MCV 98.5 98.4  PLT 135* [id number]*   Basic Metabolic Panel: Recent Labs  Lab 06/05/21 1527 06/05/21 2230 06/06/21 0130  NA 140  --  139  K 3.6  --  4.3  CL 101  --  101  CO2 33*  --  32  GLUCOSE 110*  --  260*  BUN 20  --  21  CREATININE 1.33*  --  1.47*  CALCIUM 9.0  --  8.7*  MG  --  1.9 1.9  PHOS  --   --  2.5   GFR: Estimated Creatinine Clearance: 41.2 mL/min (A) (by C-G formula based on SCr of 1.47 mg/dL (H)). Liver Function Tests: Recent Labs  Lab 06/06/21 0130  AST 14*  ALT 18  ALKPHOS 46  BILITOT 0.8  PROT 5.0*  ALBUMIN 2.7*   No results for input(s): LIPASE, AMYLASE in the last 168 hours. No results for input(s): AMMONIA in the last 168 hours. Coagulation Profile: No results for input(s): INR, PROTIME in the last 168 hours. Cardiac Enzymes: No results for input(s): CKTOTAL, CKMB, CKMBINDEX, TROPONINI  in the last 168 hours. BNP (last 3 results) No results for input(s): PROBNP in the last 8760 hours. HbA1C: No results for input(s): HGBA1C in the last 72 hours. CBG: No results for input(s): GLUCAP in the last 168 hours. Lipid Profile: No results for input(s): CHOL, HDL, LDLCALC, TRIG, CHOLHDL, LDLDIRECT in the last 72 hours. Thyroid Function Tests: No results for input(s): TSH, T4TOTAL, FREET4, T3FREE, THYROIDAB in the last 72 hours. Anemia Panel: No results for input(s): VITAMINB12, FOLATE, FERRITIN, TIBC, IRON, RETICCTPCT in the last 72 hours. Sepsis Labs: Recent Labs  Lab 06/05/21 2230  PROCALCITON 0.11    Recent Results (from the past 240 hour(s))  SARS CORONAVIRUS 2 (TAT 6-24 HRS) Nasopharyngeal Nasopharyngeal Swab     Status: Abnormal   Collection Time: 06/05/21  8:13 PM   Specimen: Nasopharyngeal Swab  Result Value Ref Range Status   SARS Coronavirus 2 POSITIVE (A) NEGATIVE Final    Comment: (NOTE) SARS-CoV-2 target nucleic acids are DETECTED.  The SARS-CoV-2 RNA is generally detectable in upper and lower respiratory specimens during the acute phase of infection. Positive results are indicative of the presence of SARS-CoV-2 RNA. Clinical correlation with patient history and other diagnostic information is  necessary to determine patient infection status. Positive results do not rule out bacterial infection or co-infection with other viruses.  The expected result is Negative.  Fact Sheet for Patients: SugarRoll.be  Fact Sheet for Healthcare Providers: https://www.woods-mathews.com/  This test is not yet approved or cleared by the [place] FDA and  has been authorized for detection and/or diagnosis of SARS-CoV-2 by FDA under an Emergency Use Authorization (EUA). This EUA will remain  in effect (meaning this test can be used) for the duration of the COVID-19 declaration under Section 564(b)(1) of the Act, 21 U.  S.C. section 360bbb-3(b)(1), unless the authorization is terminated or revoked sooner.   Performed at [facility], [place] [address] [place], [place] [address]          Radiology Studies: DG Chest 2 View  Result Date: 06/05/2021 CLINICAL DATA:  Chest pain EXAM: CHEST - 2 VIEW COMPARISON:  May 18, 2021 FINDINGS: Prior median sternotomy. The heart size and mediastinal contours are within normal limits. Aortic atherosclerosis. Pulmonary hyperinflation with apical predominant emphysematous change and chronic bronchitic changes. No new focal consolidation. No pleural effusion. No pneumothorax. Thoracic spondylosis. IMPRESSION: 1. No active cardiopulmonary disease. 2. Chronic changes of COPD. 3. Aortic Atherosclerosis (ICD10-I70.0) and Emphysema (ICD10-J43.9). Electronically Signed   By: Dahlia Bailiff MD   On: 06/05/2021 15:49        Scheduled Meds:  clopidogrel  75 mg Oral Daily   fenofibrate  54 mg Oral Daily   ipratropium-albuterol  3 mL Nebulization Q6H   methylPREDNISolone (SOLU-MEDROL) injection  40 mg Intravenous Q6H   montelukast  10 mg Oral QHS   pantoprazole  40 mg Oral BID   propranolol  40 mg Oral BID   tamsulosin  0.4 mg Oral Daily   torsemide  50 mg Oral Daily   Continuous Infusions:  azithromycin Stopped (06/05/21 2115)     LOS: 1 day    Time spent: 35 mins.More than 50% of that time was spent in counseling and/or coordination of care.      Shelly Coss, MD [facility] P7/08/2021, 10:33 AM

## 2021-06-06 NOTE — Progress Notes (Signed)
*  PRELIMINARY RESULTS* Echocardiogram 2D Echocardiogram has been performed.  Daniel Barker 06/06/2021, 5:13 PM

## 2021-06-07 LAB — CBC WITH DIFFERENTIAL/PLATELET
Abs Immature Granulocytes: 0.1 10*3/uL — ABNORMAL HIGH (ref 0.00–0.07)
Basophils Absolute: 0 10*3/uL (ref 0.0–0.1)
Basophils Relative: 0 %
Eosinophils Absolute: 0 10*3/uL (ref 0.0–0.5)
Eosinophils Relative: 0 %
HCT: 37.2 % — ABNORMAL LOW (ref 39.0–52.0)
Hemoglobin: 11.8 g/dL — ABNORMAL LOW (ref 13.0–17.0)
Immature Granulocytes: 1 %
Lymphocytes Relative: 3 %
Lymphs Abs: 0.4 10*3/uL — ABNORMAL LOW (ref 0.7–4.0)
MCH: 30.8 pg (ref 26.0–34.0)
MCHC: 31.7 g/dL (ref 30.0–36.0)
MCV: 97.1 fL (ref 80.0–100.0)
Monocytes Absolute: 0.2 10*3/uL (ref 0.1–1.0)
Monocytes Relative: 2 %
Neutro Abs: 13.4 10*3/uL — ABNORMAL HIGH (ref 1.7–7.7)
Neutrophils Relative %: 94 %
Platelets: 116 10*3/uL — ABNORMAL LOW (ref 150–400)
RBC: 3.83 MIL/uL — ABNORMAL LOW (ref 4.22–5.81)
RDW: 12.9 % (ref 11.5–15.5)
WBC: 14.1 10*3/uL — ABNORMAL HIGH (ref 4.0–10.5)
nRBC: 0 % (ref 0.0–0.2)

## 2021-06-07 LAB — BASIC METABOLIC PANEL
Anion gap: 7 (ref 5–15)
BUN: 35 mg/dL — ABNORMAL HIGH (ref 8–23)
CO2: 39 mmol/L — ABNORMAL HIGH (ref 22–32)
Calcium: 8.9 mg/dL (ref 8.9–10.3)
Chloride: 95 mmol/L — ABNORMAL LOW (ref 98–111)
Creatinine, Ser: 1.44 mg/dL — ABNORMAL HIGH (ref 0.61–1.24)
GFR, Estimated: 51 mL/min — ABNORMAL LOW (ref >60)
Glucose, Bld: 172 mg/dL — ABNORMAL HIGH (ref 70–99)
Potassium: 3.7 mmol/L (ref 3.5–5.1)
Sodium: 141 mmol/L (ref 135–145)

## 2021-06-07 MED ORDER — IPRATROPIUM-ALBUTEROL 0.5-2.5 (3) MG/3ML IN SOLN
3.0000 mL | Freq: Four times a day (QID) | RESPIRATORY_TRACT | Status: DC
  Administered 2021-06-07 – 2021-06-08 (×5): 3 mL via RESPIRATORY_TRACT
  Filled 2021-06-07 (×5): qty 3

## 2021-06-07 MED ORDER — ZOLPIDEM TARTRATE 5 MG PO TABS
5.0000 mg | ORAL_TABLET | Freq: Every evening | ORAL | Status: DC | PRN
  Administered 2021-06-07: 5 mg via ORAL
  Filled 2021-06-07: qty 1

## 2021-06-07 MED ORDER — AZITHROMYCIN 250 MG PO TABS
500.0000 mg | ORAL_TABLET | Freq: Once | ORAL | Status: AC
  Administered 2021-06-07: 500 mg via ORAL
  Filled 2021-06-07: qty 2

## 2021-06-07 NOTE — Plan of Care (Addendum)
Patient is not in isolation, but will be placed on isolation pending review from infection prevention (AC, CN, MD and IP notified).  Edit* *Patient was ultimately cleared by MD and IP for no isolation and the isolation was discontinued.*  Now on PO antibiotics. PT/OT consults today. Tentative plan for discharge to home tomorrow. Remains on 1 L [place] O2.   Problem: Education: Goal: Knowledge of General Education information will improve Description: Including pain rating scale, medication(s)/side effects and non-pharmacologic comfort measures Outcome: Progressing   Problem: Health Behavior/Discharge Planning: Goal: Ability to manage health-related needs will improve Outcome: Progressing   Problem: Clinical Measurements: Goal: Ability to maintain clinical measurements within normal limits will improve Outcome: Progressing Goal: Will remain free from infection Outcome: Progressing Goal: Diagnostic test results will improve Outcome: Progressing Goal: Respiratory complications will improve Outcome: Progressing Goal: Cardiovascular complication will be avoided Outcome: Progressing   Problem: Activity: Goal: Risk for activity intolerance will decrease Outcome: Progressing   Problem: Nutrition: Goal: Adequate nutrition will be maintained Outcome: Progressing   Problem: Coping: Goal: Level of anxiety will decrease Outcome: Progressing   Problem: Elimination: Goal: Will not experience complications related to bowel motility Outcome: Progressing Goal: Will not experience complications related to urinary retention Outcome: Progressing   Problem: Pain Managment: Goal: General experience of comfort will improve Outcome: Progressing   Problem: Safety: Goal: Ability to remain free from injury will improve Outcome: Progressing   Problem: Skin Integrity: Goal: Risk for impaired skin integrity will decrease Outcome: Progressing

## 2021-06-07 NOTE — Evaluation (Signed)
Occupational Therapy Evaluation Patient Details Name: Daniel Barker MRN: [id number] DOB: 1946/11/23 Today's Date: 06/07/2021    History of Present Illness 75 y.o. male presenting to ED 7/9 with DOE. Patient admitted with acute on chronic hypoxic respiratory failure secondary to COPD exacerbation in setting of recent COVID-19 infection. PMHx significant for 2L home O2, COPD, Hx of COVID-19 (admitted to [facility] 05/18/2021-/05/25/2021), A-fib on anticoagulation, CKD III and CAD s/p CABG.   Clinical Impression   PTA patient was living with his wife for whom he provides 24hr supervision for and was grossly I with ADLs/IADLs without AD. Reports use of home O2 at night only but occasionally uses it throughout the day. Patient currently functioning slightly below baseline requiring frequent rest breaks with mobility secondary to fatigue. Patient reports fall with resulting L humeral fx in 10/2020 and states he has not yet fully recovered. Patient also limited by deficits listed below including decreased activity tolerance and decreased cardiopulmonary status requiring continuous O2 via [place] and would benefit from continued acute OT services in prep for safe d/c home. Anticipate patient to progress well.     Follow Up Recommendations  No OT follow up    Equipment Recommendations  None recommended by OT    Recommendations for Other Services       Precautions / Restrictions Precautions Precautions: Fall Restrictions Weight Bearing Restrictions: No      Mobility Bed Mobility               General bed mobility comments: Seated EOB upon entry.    Transfers Overall transfer level: Modified independent                    Balance Overall balance assessment: Mild deficits observed, not formally tested                                         ADL either performed or assessed with clinical judgement   ADL Overall ADL's : Modified independent                                        General ADL Comments: Requires frequent rest breaks secondary to decreased activity tolerance but able to ambulate household distances without AD.     Vision   Vision Assessment?: No apparent visual deficits     Perception     Praxis      Pertinent Vitals/Pain Pain Assessment: No/denies pain     Hand Dominance Left   Extremity/Trunk Assessment Upper Extremity Assessment Upper Extremity Assessment: LUE deficits/detail LUE Deficits / Details: Reports fall 10/2020 resulting in L humeral fx. AROM limited at shoulder.   Lower Extremity Assessment Lower Extremity Assessment: Defer to PT evaluation   Cervical / Trunk Assessment Cervical / Trunk Assessment: Kyphotic   Communication Communication Communication: HOH   Cognition Arousal/Alertness: Awake/alert Behavior During Therapy: WFL for tasks assessed/performed Overall Cognitive Status: Within Functional Limits for tasks assessed                                     General Comments  SpO2 96% on 1L O2 via [place]. Desat to 91% on 1L after mobility in hallway.    Exercises     Shoulder  Instructions      Home Living Family/patient expects to be discharged to:: Private residence Living Arrangements: Spouse/significant other;Other (Comment) (Provides necessary 24hr supervision for his wife who has dementia) Available Help at Discharge: Family;Available PRN/intermittently Type of Home: Mobile home Home Access: Stairs to enter Entrance Stairs-Number of Steps: 4-5 Entrance Stairs-Rails: Right;Left Home Layout: One level     Bathroom Shower/Tub: Teacher, early years/pre: Standard     Home Equipment: None          Prior Functioning/Environment Level of Independence: Independent        Comments: I with ADLs/IADLs; drives; cares for his wife        OT Problem List: Cardiopulmonary status limiting activity      OT Treatment/Interventions:      OT  Goals(Current goals can be found in the care plan section) Acute Rehab OT Goals Patient Stated Goal: To return home. OT Goal Formulation: With patient Time For Goal Achievement: 06/21/21 Potential to Achieve Goals: Good ADL Goals Additional ADL Goal #1: Patient will recall 3 energy conservation techniques in prep for ADLs/IADLs. Additional ADL Goal #2: Patient will tolerate 15 mintues of therapeutic activity without need for rest break indicating increased activity tolerance. Additional ADL Goal #3: Patient will complete ADLs/ADL transfers with Mod I and AD PRN.  OT Frequency:     Barriers to D/C:            Co-evaluation              AM-PAC OT "6 Clicks" Daily Activity     Outcome Measure Help from another person eating meals?: None Help from another person taking care of personal grooming?: None Help from another person toileting, which includes using toliet, bedpan, or urinal?: None Help from another person bathing (including washing, rinsing, drying)?: None Help from another person to put on and taking off regular upper body clothing?: None Help from another person to put on and taking off regular lower body clothing?: None 6 Click Score: 24   End of Session Equipment Utilized During Treatment: Gait belt;Oxygen Nurse Communication: Mobility status;Other (comment) (Response to treatment.)  Activity Tolerance: Patient tolerated treatment well Patient left: in chair;with call bell/phone within reach  OT Visit Diagnosis: Muscle weakness (generalized) (M62.81)                Time: [id number] OT Time Calculation (min): 16 min Charges:  OT General Charges $OT Visit: 1 Visit OT Evaluation $OT Eval Low Complexity: 1 Low  Colleen Kotlarz H. OTR/L Supplemental OT, Department of rehab services [phone]  Ailee Pates R H. 06/07/2021, 1:35 PM

## 2021-06-07 NOTE — Progress Notes (Signed)
SATURATION QUALIFICATIONS: (This note is used to comply with regulatory documentation for home oxygen)  Patient Saturations on Room Air at Rest = Did not register in sitting. Pt stood up quickly. O2 sat in standing 83%  Patient Saturations on Room Air while Ambulating = Did not test as pt desat on room air with little exertion  Patient Saturations on 3 Liters of oxygen while Ambulating = 90%  Please briefly explain why patient needs home oxygen: Pt desat on room at rest and with activity. Needed 2LPM of O2 at rest and 3LPM of O2 with activity to maintain O2sat > 88%. Thanks  Louie Casa, SPT Acute Rehab: [phone]

## 2021-06-07 NOTE — Consult Note (Signed)
   [facility] CM Inpatient Consult   06/07/2021  Roxie Arb Aug 08, 1946 [id number]  [place] [facility] [ACO] Patient: [employer]  Primary Care Provider:  Pamella Pert Hall< mD, this provider is listed for the [facility] follow up  Patient evaluated for community based chronic disease management services with [place] [facility] as a benefit of patient's [employer]. Patient recently assigned for [place] [facility] follow up.   Plan:  Will notify [place] Coordinator of admission and follow with Inpatient Transition of Care [TOC] Case Manager for [facility] needs.  Of note, [facility] Care Management services does not replace or interfere with any services that are arranged by inpatient Transition of Care [TOC] team     For additional questions or referrals please contact:    Natividad Brood, RN BSN [facility] Liaison  [phone] business mobile phone Toll free office [phone]  Fax number: [phone] [email or web address]  [email or web address]

## 2021-06-07 NOTE — Progress Notes (Signed)
PROGRESS NOTE    Cordney Hanes  [id number] DOB: March 10, 1946 DOA: 06/05/2021 PCP: [name], MD   Chief Complain:SOB, dyspnea on exertion  Brief Narrative: Patient is a 22 male with history of chronic hypoxic respiratory failure on 2 L of oxygen per minute, COPD, recent history of COVID infection, chronic A. fib not on anticoagulation, stage III CKD with baseline creatinine of 1.3-1.5, coronary disease status post CABG presented with dyspnea on exertion.  He was recently admitted and managed here from 6/21 -6/26 for COVID infection.  He was discharged home on prednisone taper.  Patient presented to the emergency department with complaints of dyspnea on exertion.  Patient was admitted for the management of acute hypoxic respiratory failure secondary to COPD exacerbation and in the setting of recent COVID infection.  Assessment & Plan:   Principal Problem:   Acute exacerbation of chronic obstructive pulmonary disease (COPD) (HCC) Active Problems:   BPH (benign prostatic hyperplasia)   Atrial fibrillation, chronic (HCC)   SOB (shortness of breath)   Elevated troponin   Chronic kidney disease, stage 3a (HCC)  Acute on chronic respiratory failure with hypoxia/dyspnea on exertion: Likely secondary to COPD exacerbation and recent history of COVID. Currently he is on baseline oxygen requirement.  VQ scan negative for PE. CXR didn't show any PNA.  Acute COPD exacerbation: Continue bronchodilators, currently on baseline oxygen requirement, started on oral steroids.  Continue mucolytic's.  Also started on azithromycin.  Continue flutter valve, incentive spirometer  Recent COVID infection: Hospitalized from 6/21-6/26 for COVID infection.  Currently not on isolation.  Complains of cough.  Leukocytosis: Most likely secondary to recent history of steroid use for COVID infection.  Continue to monitor, improving  Elevated troponin: Mild, flat trained.  Denies any chest pain.  EKG did not show any  ischemic changes.  Echocardiogram did not show any wall motion abnormality, showed ejection fraction of 60 to [id number], grade 1 diastolic dysfunction  Chronic A. fib:   Not on anticoagulation because patient declined.  Follows with Dr. [name], cardiology.  Monitor on telemetry.  BPH: on Flomax  Stage IIIa CKD: Baseline kidney function ranged from 1.3-1.5.  Currently kidney function at baseline.  Hyperlipidemia: On fenofibrate  GERD: Protonix  Thrombocytopenia: Mild, continue to monitor.  Elevated BNP: Patient looks euvolemic on examination.  BNP elevated.  Continue torsemide 40 mg that he takes at home.  Echo as above  Debility/deconditioning: Patient lives with stepdaughter and demented wife.  We have requested for PT/OT evaluation         DVT prophylaxis:SCD Code Status:  Family Communication:: Discussed with daughter-in-law on phone. Status is: Inpatient  Remains inpatient appropriate because:IV treatments appropriate due to intensity of illness or inability to take PO  Dispo: The patient is from: Home              Anticipated d/c is to: Home              Patient currently is not medically stable to d/c.   Difficult to place patient No     Consultants: None  Procedures:None  Antimicrobials:  Anti-infectives (From admission, onward)    Start     Dose/Rate Route Frequency Ordered Stop   06/07/21 0830  azithromycin (ZITHROMAX) tablet 500 mg        500 mg Oral  Once 06/07/21 0739     06/05/21 1915  azithromycin (ZITHROMAX) 500 mg in sodium chloride 0.9 % 250 mL IVPB  Status:  Discontinued  500 mg 250 mL/hr over 60 Minutes Intravenous Every 24 hours 06/05/21 1902 06/07/21 0739       Subjective:  Patient seen and examined the bedside this morning.  Hemodynamically stable.  Lying on bed.  He said he did not sleep last night.  Respiratory status remained stable, currently on 2 L of oxygen per minute.  Plan for PT/OT evaluation today.  We discussed that if PT/OT  recommends home health, potential discharge tomorrow   Objective: Vitals:   06/06/21 1100 06/06/21 1947 06/06/21 2053 06/07/21 0342  BP: (!) 144/77  125/67 130/68  Pulse: 76  75 68  Resp: '17  18 18  '$ Temp: 98 F (36.7 C)  (!) 97.5 F (36.4 C) 97.7 F (36.5 C)  TempSrc: Oral   Oral  SpO2: 100% 98% 98% 98%  Weight:        Intake/Output Summary (Last 24 hours) at 06/07/2021 0740 Last data filed at 06/07/2021 0343 Gross per 24 hour  Intake 730 ml  Output 1800 ml  Net -1070 ml   Filed Weights   06/06/21 0837  Weight: 70.2 kg    Examination:  General exam: Overall comfortable, not in distress HEENT: PERRL Respiratory system: Diminished air sounds bilaterally, no wheezes or crackles  Cardiovascular system: S1 & S2 heard, RRR.  Gastrointestinal system: Abdomen is nondistended, soft and nontender. Central nervous system: Alert and oriented Extremities: No edema, no clubbing ,no cyanosis Skin: No rashes, no ulcers,no icterus      Data Reviewed: I have personally reviewed following labs and imaging studies  CBC: Recent Labs  Lab 06/05/21 1527 06/06/21 0130 06/07/21 0301  WBC 21.3* 20.5* 14.1*  NEUTROABS  --   --  13.4*  HGB 12.5* 11.5* 11.8*  HCT 40.4 36.5* 37.2*  MCV 98.5 98.4 97.1  PLT 135* 110* [id number]*   Basic Metabolic Panel: Recent Labs  Lab 06/05/21 1527 06/05/21 2230 06/06/21 0130 06/07/21 0301  NA 140  --  139 141  K 3.6  --  4.3 3.7  CL 101  --  101 95*  CO2 33*  --  32 39*  GLUCOSE 110*  --  260* 172*  BUN 20  --  21 35*  CREATININE 1.33*  --  1.47* 1.44*  CALCIUM 9.0  --  8.7* 8.9  MG  --  1.9 1.9  --   PHOS  --   --  2.5  --    GFR: Estimated Creatinine Clearance: 42.1 mL/min (A) (by C-G formula based on SCr of 1.44 mg/dL (H)). Liver Function Tests: Recent Labs  Lab 06/06/21 0130  AST 14*  ALT 18  ALKPHOS 46  BILITOT 0.8  PROT 5.0*  ALBUMIN 2.7*   No results for input(s): LIPASE, AMYLASE in the last 168 hours. No results for  input(s): AMMONIA in the last 168 hours. Coagulation Profile: No results for input(s): INR, PROTIME in the last 168 hours. Cardiac Enzymes: No results for input(s): CKTOTAL, CKMB, CKMBINDEX, TROPONINI in the last 168 hours. BNP (last 3 results) No results for input(s): PROBNP in the last 8760 hours. HbA1C: No results for input(s): HGBA1C in the last 72 hours. CBG: No results for input(s): GLUCAP in the last 168 hours. Lipid Profile: No results for input(s): CHOL, HDL, LDLCALC, TRIG, CHOLHDL, LDLDIRECT in the last 72 hours. Thyroid Function Tests: No results for input(s): TSH, T4TOTAL, FREET4, T3FREE, THYROIDAB in the last 72 hours. Anemia Panel: No results for input(s): VITAMINB12, FOLATE, FERRITIN, TIBC, IRON, RETICCTPCT in the last 72  hours. Sepsis Labs: Recent Labs  Lab 06/05/21 2230  PROCALCITON 0.11    Recent Results (from the past 240 hour(s))  SARS CORONAVIRUS 2 (TAT 6-24 HRS) Nasopharyngeal Nasopharyngeal Swab     Status: Abnormal   Collection Time: 06/05/21  8:13 PM   Specimen: Nasopharyngeal Swab  Result Value Ref Range Status   SARS Coronavirus 2 POSITIVE (A) NEGATIVE Final    Comment: (NOTE) SARS-CoV-2 target nucleic acids are DETECTED.  The SARS-CoV-2 RNA is generally detectable in upper and lower respiratory specimens during the acute phase of infection. Positive results are indicative of the presence of SARS-CoV-2 RNA. Clinical correlation with patient history and other diagnostic information is  necessary to determine patient infection status. Positive results do not rule out bacterial infection or co-infection with other viruses.  The expected result is Negative.  Fact Sheet for Patients: SugarRoll.be  Fact Sheet for Healthcare Providers: https://www.woods-mathews.com/  This test is not yet approved or cleared by the [place] FDA and  has been authorized for detection and/or diagnosis of SARS-CoV-2 by FDA  under an Emergency Use Authorization (EUA). This EUA will remain  in effect (meaning this test can be used) for the duration of the COVID-19 declaration under Section 564(b)(1) of the Act, 21 U. S.C. section 360bbb-3(b)(1), unless the authorization is terminated or revoked sooner.   Performed at [facility], [place] [address], [place], [place] [address]          Radiology Studies: DG Chest 2 View  Result Date: 06/05/2021 CLINICAL DATA:  Chest pain EXAM: CHEST - 2 VIEW COMPARISON:  May 18, 2021 FINDINGS: Prior median sternotomy. The heart size and mediastinal contours are within normal limits. Aortic atherosclerosis. Pulmonary hyperinflation with apical predominant emphysematous change and chronic bronchitic changes. No new focal consolidation. No pleural effusion. No pneumothorax. Thoracic spondylosis. IMPRESSION: 1. No active cardiopulmonary disease. 2. Chronic changes of COPD. 3. Aortic Atherosclerosis (ICD10-I70.0) and Emphysema (ICD10-J43.9). Electronically Signed   By: Dahlia Bailiff MD   On: 06/05/2021 15:49   NM Pulmonary Perfusion  Result Date: 06/06/2021 CLINICAL DATA:  Respiratory failure.  Shortness of.  COPD. EXAM: NUCLEAR MEDICINE PERFUSION LUNG SCAN TECHNIQUE: Perfusion images were obtained in multiple projections after intravenous injection of radiopharmaceutical. Ventilation scans intentionally deferred if perfusion scan and chest x-ray adequate for interpretation during COVID 19 epidemic. RADIOPHARMACEUTICALS:  4.4 mCi Tc-84mMAA IV COMPARISON:  Chest radiographs obtained yesterday. FINDINGS: Minimally heterogeneous perfusion of both lungs with no discrete perfusion defects seen. IMPRESSION: 1. Minimally heterogeneous perfusion of both lungs, compatible with the history of COPD. 2. No evidence of pulmonary embolism. Electronically Signed   By: SClaudie ReveringM.D.   On: 06/06/2021 16:44   ECHOCARDIOGRAM COMPLETE  Result Date: 06/06/2021    ECHOCARDIOGRAM REPORT   Patient  Name:   RISSAI KRUSDate of Exam: 06/06/2021 Medical Rec #:  [id number]     Height:       67.0 in Accession #:    [id number]    Weight:       154.8 lb Date of Birth:  [birth year]    BSA:          1.814 m Patient Age:    89years       BP:           144/77 mmHg Patient Gender: M              HR:  76 bpm. Exam Location:  Inpatient Procedure: 2D Echo, Cardiac Doppler and Color Doppler Indications:    Elevated Troponin  History:        Patient has no prior history of Echocardiogram examinations.                 Prior CABG, COPD, Arrythmias:Atrial Fibrillation; Risk                 Factors:Hypertension, Dyslipidemia and Current Smoker. Stented                 coronary artery (From Hx), Hx of Covid-19 infection.  Sonographer:    Alvino Chapel RCS Referring Phys: [id number] [place]  1. There is exaggerated septal displacement with respiration as well as exaggerated respiratory variation in mitral inflow velocities. This suggests constrictive physiology, but may also be due to increased work of breathing with COPD exacerbation or asthma. Left ventricular ejection fraction, by estimation, is 60 to 65%. The left ventricle has normal function. The left ventricle has no regional wall motion abnormalities. Left ventricular diastolic parameters are consistent with Grade I diastolic dysfunction (impaired relaxation).  2. Right ventricular systolic function is normal. The right ventricular size is mildly enlarged.  3. Right atrial size was mildly dilated.  4. The mitral valve is normal in structure. No evidence of mitral valve regurgitation.  5. The aortic valve has an indeterminant number of cusps. There is moderate calcification of the aortic valve. There is moderate thickening of the aortic valve. Aortic valve regurgitation is not visualized. Mild to moderate aortic valve sclerosis/calcification is present, without any evidence of aortic stenosis.  6. The inferior vena cava is dilated in size with  >50% respiratory variability, suggesting right atrial pressure of 8 mmHg. FINDINGS  Left Ventricle: There is exaggerated septal displacement with respiration as well as exaggerated respiratory variation in mitral inflow velocities. This suggests constrictive physiology, but may also be due to increased work of breathing with COPD exacerbation or asthma. Left ventricular ejection fraction, by estimation, is 60 to 65%. The left ventricle has normal function. The left ventricle has no regional wall motion abnormalities. The left ventricular internal cavity size was normal in size. There is no left ventricular hypertrophy. Abnormal (paradoxical) septal motion consistent with post-operative status. Left ventricular diastolic parameters are consistent with Grade I diastolic dysfunction (impaired relaxation). Indeterminate filling pressures. Right Ventricle: The right ventricular size is mildly enlarged. No increase in right ventricular wall thickness. Right ventricular systolic function is normal. Left Atrium: Left atrial size was normal in size. Right Atrium: Right atrial size was mildly dilated. Pericardium: There is no evidence of pericardial effusion. Presence of pericardial fat pad. Mitral Valve: The mitral valve is normal in structure. No evidence of mitral valve regurgitation. Tricuspid Valve: The tricuspid valve is normal in structure. Tricuspid valve regurgitation is not demonstrated. Aortic Valve: The aortic valve has an indeterminant number of cusps. There is moderate calcification of the aortic valve. There is moderate thickening of the aortic valve. Aortic valve regurgitation is not visualized. Mild to moderate aortic valve sclerosis/calcification is present, without any evidence of aortic stenosis. Pulmonic Valve: The pulmonic valve was not well visualized. Pulmonic valve regurgitation is not visualized. Aorta: The aortic root and ascending aorta are structurally normal, with no evidence of dilitation.  Venous: The inferior vena cava is dilated in size with greater than 50% respiratory variability, suggesting right atrial pressure of 8 mmHg. IAS/Shunts: No atrial level shunt detected by color flow Doppler.  LEFT VENTRICLE PLAX 2D LVIDd:         4.30 cm  Diastology LVIDs:         2.70 cm  LV e' medial:    7.13 cm/s LV PW:         1.10 cm  LV E/e' medial:  12.1 LV IVS:        0.90 cm  LV e' lateral:   6.85 cm/s LVOT diam:     1.60 cm  LV E/e' lateral: 12.5 LV SV:         55 LV SV Index:   30 LVOT Area:     2.01 cm  RIGHT VENTRICLE RV S prime:     10.00 cm/s TAPSE (M-mode): 1.4 cm LEFT ATRIUM             Index       RIGHT ATRIUM           Index LA diam:        3.20 cm 1.76 cm/m  RA Area:     20.10 cm LA Vol (A2C):   42.9 ml 23.65 ml/m RA Volume:   58.40 ml  32.20 ml/m LA Vol (A4C):   48.9 ml 26.96 ml/m LA Biplane Vol: 48.8 ml 26.91 ml/m  AORTIC VALVE LVOT Vmax:   133.00 cm/s LVOT Vmean:  77.100 cm/s LVOT VTI:    0.273 m  AORTA Ao Root diam: 3.50 cm MITRAL VALVE MV Area (PHT): 2.81 cm    SHUNTS MV Decel Time: 270 msec    Systemic VTI:  0.27 m MV E velocity: 85.90 cm/s  Systemic Diam: 1.60 cm MV A velocity: 96.85 cm/s MV E/A ratio:  0.89 Mihai Croitoru MD Electronically signed by Sanda Klein MD Signature Date/Time: 06/06/2021/5:33:55 PM    Final         Scheduled Meds:  albuterol  2.5 mg Nebulization BID   azithromycin  500 mg Oral Once   clopidogrel  75 mg Oral Daily   fenofibrate  54 mg Oral Daily   guaiFENesin  600 mg Oral BID   ipratropium-albuterol  3 mL Nebulization QID   methylPREDNISolone (SOLU-MEDROL) injection  40 mg Intravenous Q12H   montelukast  10 mg Oral QHS   pantoprazole  40 mg Oral BID   propranolol  40 mg Oral BID   tamsulosin  0.4 mg Oral Daily   torsemide  50 mg Oral Daily   Continuous Infusions:     LOS: 2 days    Time spent: 25 mins.More than 50% of that time was spent in counseling and/or coordination of care.      Shelly Coss, MD [facility] P7/09/2021, 7:40 AM

## 2021-06-07 NOTE — Evaluation (Signed)
Physical Therapy Evaluation Patient Details Name: Daniel Barker MRN: [id number] DOB: 22-Apr-1946 Today's Date: 06/07/2021   History of Present Illness  75 y.o. male presenting to ED 7/9 with DOE. Patient admitted with acute on chronic hypoxic respiratory failure secondary to COPD exacerbation in setting of recent COVID-19 infection. PMHx significant for 2L home O2, COPD, Hx of COVID-19 (admitted to [facility] 05/18/2021-/05/25/2021), A-fib on anticoagulation, CKD III and CAD s/p CABG.  Clinical Impression  Pt alert and awake at start of PT. Ambulated approximately 147f with min guard. Demonstrated lateral trunk sway, but did not have loss of balance. Patient Saturations on Room Air at Rest = Did not register in sitting. Pt stood up quickly. O2 sat in standing 83% Patient Saturations on Room Air while Ambulating = Did not test as pt desat on room air with little exertion Patient Saturations on 3 Liters of oxygen while Ambulating = 90%. Recommend pt continues with PT to address mild gait and balance deficits. Recommend pt for home O2.    Follow Up Recommendations No PT follow up    Equipment Recommendations  Other (comment) (Home O2)    Recommendations for Other Services       Precautions / Restrictions Precautions Precautions: Fall Restrictions Weight Bearing Restrictions: No      Mobility  Bed Mobility Overal bed mobility: Modified Independent             General bed mobility comments: Pt seated in recliner prior to start of session.    Transfers Overall transfer level: Needs assistance Equipment used: None Transfers: Sit to/from Stand Sit to Stand: Supervision            Ambulation/Gait Ambulation/Gait assistance: Min guard Gait Distance (Feet): 100 Feet Assistive device: None Gait Pattern/deviations: WFL(Within Functional Limits);Step-through pattern   Gait velocity interpretation: >2.62 ft/sec, indicative of community ambulatory General Gait Details: Lateral trunk  lean bilaterally when ambulating. Patient Saturations on Room Air while Ambulating = Did not test as pt desat on room air with little exertion Patient Saturations on 3 Liters of oxygen while Ambulating = 90%  Stairs            Wheelchair Mobility    Modified Rankin (Stroke Patients Only)       Balance Overall balance assessment: Mild deficits observed, not formally tested                                           Pertinent Vitals/Pain Pain Assessment: No/denies pain    Home Living Family/patient expects to be discharged to:: Private residence Living Arrangements: Spouse/significant other;Other (Comment) (Provides necessary 24hr supervision for his wife who has dementia) Available Help at Discharge: Family;Available PRN/intermittently Type of Home: Mobile home Home Access: Stairs to enter Entrance Stairs-Rails: RPsychiatric nurseof Steps: 4-5 Home Layout: One level Home Equipment: None      Prior Function Level of Independence: Independent         Comments: I with ADLs/IADLs; drives; cares for his wife     Hand Dominance   Dominant Hand: Left    Extremity/Trunk Assessment   Upper Extremity Assessment Upper Extremity Assessment: LUE deficits/detail LUE Deficits / Details: Reports fall 10/2020 resulting in L humeral fx. AROM limited at shoulder.    Lower Extremity Assessment Lower Extremity Assessment: Defer to PT evaluation    Cervical / Trunk Assessment Cervical / Trunk Assessment: Kyphotic  Communication  Communication: HOH  Cognition Arousal/Alertness: Awake/alert Behavior During Therapy: WFL for tasks assessed/performed Overall Cognitive Status: Within Functional Limits for tasks assessed                                        General Comments     Exercises     Assessment/Plan    PT Assessment Patient needs continued PT services  PT Problem List Decreased balance       PT Treatment  Interventions Gait training;Balance training;Therapeutic activities;Therapeutic exercise    PT Goals (Current goals can be found in the Care Plan section)  Acute Rehab PT Goals Patient Stated Goal: To return home. PT Goal Formulation: With patient Time For Goal Achievement: 06/21/21 Potential to Achieve Goals: Good    Frequency Min 3X/week   Barriers to discharge        Co-evaluation               AM-PAC PT "6 Clicks" Mobility  Outcome Measure Help needed turning from your back to your side while in a flat bed without using bedrails?: None Help needed moving from lying on your back to sitting on the side of a flat bed without using bedrails?: None Help needed moving to and from a bed to a chair (including a wheelchair)?: None Help needed standing up from a chair using your arms (e.g., wheelchair or bedside chair)?: None Help needed to walk in hospital room?: None Help needed climbing 3-5 steps with a railing? : None 6 Click Score: 24    End of Session Equipment Utilized During Treatment: Gait belt Activity Tolerance: Patient tolerated treatment well Patient left: in chair;with call bell/phone within reach Nurse Communication: Mobility status PT Visit Diagnosis: History of falling (Z91.81);Other abnormalities of gait and mobility (R26.89);Unsteadiness on feet (R26.81)    Time: [id number] PT Time Calculation (min) (ACUTE ONLY): 24 min   Charges:   PT Evaluation $PT Eval Low Complexity: 1 Low PT Treatments $Gait Training: 8-22 mins        Louie Casa, SPT Acute Rehab: [phone]   Domingo Dimes 06/07/2021, 4:59 PM

## 2021-06-08 ENCOUNTER — Other Ambulatory Visit: Payer: Self-pay | Admitting: Internal Medicine

## 2021-06-08 LAB — CBC WITH DIFFERENTIAL/PLATELET
Abs Immature Granulocytes: 0.07 10*3/uL (ref 0.00–0.07)
Basophils Absolute: 0 10*3/uL (ref 0.0–0.1)
Basophils Relative: 0 %
Eosinophils Absolute: 0 10*3/uL (ref 0.0–0.5)
Eosinophils Relative: 0 %
HCT: 42.2 % (ref 39.0–52.0)
Hemoglobin: 13.5 g/dL (ref 13.0–17.0)
Immature Granulocytes: 1 %
Lymphocytes Relative: 3 %
Lymphs Abs: 0.4 10*3/uL — ABNORMAL LOW (ref 0.7–4.0)
MCH: 30.9 pg (ref 26.0–34.0)
MCHC: 32 g/dL (ref 30.0–36.0)
MCV: 96.6 fL (ref 80.0–100.0)
Monocytes Absolute: 0.2 10*3/uL (ref 0.1–1.0)
Monocytes Relative: 2 %
Neutro Abs: 13.6 10*3/uL — ABNORMAL HIGH (ref 1.7–7.7)
Neutrophils Relative %: 94 %
Platelets: 140 10*3/uL — ABNORMAL LOW (ref 150–400)
RBC: 4.37 MIL/uL (ref 4.22–5.81)
RDW: 12.7 % (ref 11.5–15.5)
WBC: 14.3 10*3/uL — ABNORMAL HIGH (ref 4.0–10.5)
nRBC: 0 % (ref 0.0–0.2)

## 2021-06-08 MED ORDER — PREDNISONE 20 MG PO TABS: 40.0000 mg | ORAL_TABLET | Freq: Every day | ORAL | 0 refills | Status: AC

## 2021-06-08 MED ORDER — IPRATROPIUM-ALBUTEROL 0.5-2.5 (3) MG/3ML IN SOLN: 3.0000 mL | RESPIRATORY_TRACT | Status: DC | PRN

## 2021-06-08 MED ORDER — GUAIFENESIN ER 600 MG PO TB12: 600.0000 mg | ORAL_TABLET | Freq: Two times a day (BID) | ORAL | 0 refills | Status: AC

## 2021-06-08 NOTE — Plan of Care (Signed)

## 2021-06-08 NOTE — Care Management Important Message (Signed)
Important Message  Patient Details  Name: Daniel Barker MRN: [id number] Date of Birth: 10/06/1946   Medicare Important Message Given:  Yes     Iridessa Harrow Montine Circle 06/08/2021, 2:34 PM

## 2021-06-08 NOTE — TOC Initial Note (Addendum)
Transition of Care [facility]) - Initial/Assessment Note    Patient Details  Name: Daniel Barker MRN: [id number] Date of Birth: 10/12/1946  [facility] CM/SW Contact:    Daniel Chars, LCSW Phone Number: 06/08/2021, 10:12 AM  Clinical Narrative:  CSW met with pt regarding home O2 provider.  Pt confirms he has all equipment at home, cannot recall the DME company.  Permission given to speak with step daughter Daniel Barker or granddaughter Daniel Barker.  CSW discussed readmission risk items: PCP in place in [place], Dr [name].  Pt drives own vehicle or family will help with transportation.  CSW spoke with Daniel Barker at [place] who confirmed that pt is active O2 client.      1020: Per MD, pt DC today.  Spoke with pt again, his step daughter will pick him up after work and she will bring his portable O2 tank with her.               Expected Discharge Plan: Home/Self Care Barriers to Discharge: Continued Medical Work up   Patient Goals and CMS Choice Patient states their goals for this hospitalization and ongoing recovery are:: "take care of myself, my wife, and my dog" CMS Medicare.gov Compare Post Acute Care list provided to::  (NA)    Expected Discharge Plan and Services Expected Discharge Plan: Home/Self Care In-house Referral: Clinical Social Work   Post Acute Care Choice: Durable Medical Equipment Living arrangements for the past 2 months: Single Family Home                           HH Arranged: NA          Prior Living Arrangements/Services Living arrangements for the past 2 months: Single Family Home Lives with:: Spouse, Relatives (step daughter) Patient language and need for interpreter reviewed:: Yes Do you feel safe going back to the place where you live?: Yes      Need for Family Participation in Patient Care: No (Comment) Care giver support system in place?: Yes (comment) Current home services: DME ([employer]) Criminal Activity/Legal Involvement Pertinent to  Current Situation/Hospitalization: No - Comment as needed  Activities of Daily Living Home Assistive Devices/Equipment: None ADL Screening (condition at time of admission) Patient's cognitive ability adequate to safely complete daily activities?: Yes Is the patient deaf or have difficulty hearing?: Yes Does the patient have difficulty seeing, even when wearing glasses/contacts?: No Does the patient have difficulty concentrating, remembering, or making decisions?: No Patient able to express need for assistance with ADLs?: Yes Does the patient have difficulty dressing or bathing?: Yes Independently performs ADLs?: Yes (appropriate for developmental age) Does the patient have difficulty walking or climbing stairs?: Yes Weakness of Legs: Both Weakness of Arms/Hands: Both  Permission Sought/Granted Permission sought to share information with : Family Supports Permission granted to share information with : Yes, Verbal Permission Granted  Share Information with NAME: Daniel Barker, step daughter           Emotional Assessment Appearance:: Appears stated age Attitude/Demeanor/Rapport: Engaged Affect (typically observed): Appropriate, Pleasant Orientation: : Oriented to Self, Oriented to Place, Oriented to  Time, Oriented to Situation Alcohol / Substance Use: Not Applicable Psych Involvement: No (comment)  Admission diagnosis:  Acute exacerbation of chronic obstructive pulmonary disease (COPD) ([place]) [J44.1] COPD exacerbation ([place]) [J44.1] Patient Active Problem List   Diagnosis Date Noted   SOB (shortness of breath) 06/05/2021   Elevated troponin 06/05/2021   Chronic kidney disease, stage 3a ([place])  Acute dyspnea    Acute on chronic respiratory failure with hypoxia and hypercapnia ([facility]) 05/19/2021   Atrial fibrillation, chronic ([place]) 05/19/2021   Chronic kidney disease 05/19/2021   COVID-19 virus infection 05/18/2021   Acute exacerbation of chronic obstructive pulmonary disease (COPD)  ([place]) 04/05/2021   Closed displaced spiral fracture of shaft of left humerus 12/23/2020   Acute on chronic respiratory failure with hypoxia ([place]) 09/02/2020   Urinary retention 07/08/2020   BPH (benign prostatic hyperplasia) 07/08/2020   COPD GOLD IV / group D  05/28/2020   Cigarette smoker 05/28/2020   AKI (acute kidney injury) ([place]) 06/03/2014   Anemia 06/03/2014   CAD (coronary atherosclerotic disease) 06/03/2014   Acute respiratory failure with hypercapnia ([place]) 06/01/2014   Diabetes mellitus ([place]) 06/01/2014   Hypertension 06/01/2014   PCP:  Daniel Squibb, MD Pharmacy:   [facility] [place], [place] [address] [place] [address] [place] [id number] Phone: [phone] Fax: [phone]     Social Determinants of Health (SDOH) Interventions    Readmission Risk Interventions No flowsheet data found.

## 2021-06-08 NOTE — Discharge Summary (Signed)
Physician Discharge Summary  Daniel Barker [id number] DOB: 1946-09-11 DOA: 06/05/2021  PCP: [name], MD  Admit date: 06/05/2021 Discharge date: 06/08/2021  Admitted From: Home Disposition:  Home  Discharge Condition:Stable CODE STATUS:FULL Diet recommendation: Heart Healthy   Brief/Interim Summary:  Patient is a 61 male with history of chronic hypoxic respiratory failure on 2 L of oxygen per minute, COPD, recent history of COVID infection, chronic A. fib not on anticoagulation, stage III CKD with baseline creatinine of 1.3-1.5, coronary disease status post CABG presented with dyspnea on exertion.  He was recently admitted and managed here from 6/21 -6/26 for COVID infection.  He was discharged home on prednisone taper.  Patient presented to the emergency department with complaints of dyspnea on exertion.  Patient was admitted for the management of acute hypoxic respiratory failure secondary to COPD exacerbation and in the setting of recent COVID infection.  His respiratory status gradually improved.  Currently he is on baseline oxygen requirement.  He was seen by PT/OT , no follow-up recommended.  He is medically stable for discharge home today.  Following problems were addressed during his hospitalization:   Acute on chronic respiratory failure with hypoxia/dyspnea on exertion: Likely secondary to COPD exacerbation and recent history of COVID. Currently he is on baseline oxygen requirement.  VQ scan negative for PE. CXR didn't show any PNA.   Acute COPD exacerbation: Continue bronchodilators, currently on baseline oxygen requirement.  Continue mucolytic's.  He finished the course of   azithromycin.he will be discharged on steroids   Recent COVID infection: Hospitalized from 6/21-6/26 for COVID infection.  Currently not on isolation.  Complains of cough.   Leukocytosis: Most likely secondary to recent history of steroid use for COVID infection.  Continue to monitor, improving    Elevated troponin: Mild, flat trained.  Denies any chest pain.  EKG did not show any ischemic changes.  Echocardiogram did not show any wall motion abnormality, showed ejection fraction of 60 to [id number], grade 1 diastolic dysfunction   Chronic A. fib:   Not on anticoagulation because patient had declined.  Follows with Dr. [name], cardiology.  Monitor on telemetry.   BPH: on Flomax   Stage IIIa CKD: Baseline kidney function ranged from 1.3-1.5.  Currently kidney function at baseline.   Hyperlipidemia: On fenofibrate   GERD: On Protonix   Thrombocytopenia: Mild, continue to monitor as an outpatient   Elevated BNP: Patient looks euvolemic on examination.  BNP elevated.  Continue torsemide 40 mg that he takes at home.  Echo as above   Debility/deconditioning: Patient lives with stepdaughter ,his wife has dementia.  We have requested for PT/OT evaluation,no follow up recommended         Discharge Diagnoses:  Principal Problem:   Acute exacerbation of chronic obstructive pulmonary disease (COPD) ([place]) Active Problems:   BPH (benign prostatic hyperplasia)   Atrial fibrillation, chronic (HCC)   SOB (shortness of breath)   Elevated troponin   Chronic kidney disease, stage 3a (HCC)    Discharge Instructions  Discharge Instructions     Diet - low sodium heart healthy   Complete by: As directed    Discharge instructions   Complete by: As directed    1)Please follow-up with your PCP in a week. 2)take prescribed medications as instructed.   Increase activity slowly   Complete by: As directed       Allergies as of 06/08/2021       Reactions   Lipitor [atorvastatin]  Medication List     TAKE these medications    acetaminophen 500 MG tablet Commonly known as: TYLENOL Take 1,000 mg by mouth every 6 (six) hours as needed.   albuterol (2.5 MG/3ML) 0.083% nebulizer solution Commonly known as: PROVENTIL INHALE THE CONTENTS OF 1 VIAL VIA NEBULIZER EVERY 6 HOURS AS  NEEDED FOR WHEEZING OR SHORTNESS OF BREATH What changed: See the new instructions.   albuterol 108 (90 Base) MCG/ACT inhaler Commonly known as: VENTOLIN HFA USE 2 PUFFS BY MOUTH EVERY 4 HOURS AS NEEDED What changed:  how much to take how to take this when to take this reasons to take this   ascorbic acid 500 MG tablet Commonly known as: VITAMIN C Take 1 tablet (500 mg total) by mouth daily.   budesonide-formoterol 160-4.5 MCG/ACT inhaler Commonly known as: Symbicort Take 2 puffs first thing in am and then another 2 puffs about 12 hours later.   clopidogrel 75 MG tablet Commonly known as: PLAVIX Take 75 mg by mouth daily.   fenofibrate micronized 134 MG capsule Commonly known as: LOFIBRA Take 134 mg by mouth daily before breakfast.   guaiFENesin 600 MG 12 hr tablet Commonly known as: Mucinex Take 1 tablet (600 mg total) by mouth 2 (two) times daily for 5 days.   guaiFENesin-dextromethorphan 100-10 MG/5ML syrup Commonly known as: ROBITUSSIN DM Take 5 mLs by mouth every 4 (four) hours as needed for cough.   montelukast 10 MG tablet Commonly known as: SINGULAIR Take 10 mg by mouth at bedtime.   pantoprazole 40 MG tablet Commonly known as: PROTONIX Take 1 tablet (40 mg total) by mouth 2 (two) times daily.   predniSONE 20 MG tablet Commonly known as: DELTASONE Take 2 tablets (40 mg total) by mouth daily for 5 days. Start taking on: June 09, 2021 What changed:  how much to take how to take this when to take this additional instructions   propafenone 225 MG tablet Commonly known as: RYTHMOL Take 1 tablet (225 mg total) by mouth every 8 (eight) hours.   propranolol 40 MG tablet Commonly known as: INDERAL Take 40 mg by mouth 2 (two) times daily.   tamsulosin 0.4 MG Caps capsule Commonly known as: FLOMAX Take 1 capsule (0.4 mg total) by mouth daily.   torsemide 100 MG tablet Commonly known as: DEMADEX Take 0.5 tablets (50 mg total) by mouth daily.   zinc  sulfate 220 (50 Zn) MG capsule Take 1 capsule (220 mg total) by mouth daily.               Durable Medical Equipment  (From admission, onward)           Start     Ordered   06/08/21 0737  For home use only DME oxygen  Once       Question Answer Comment  Length of Need Lifetime   Liters per Minute 3   Frequency Continuous (stationary and portable oxygen unit needed)   Oxygen delivery system Gas      06/08/21 0736            Follow-up Information     [name], MD. Schedule an appointment as soon as possible for a visit in 1 week(s).   Specialty: Internal Medicine Contact information: [place] [address] [phone]                Allergies  Allergen Reactions   Lipitor [Atorvastatin]     Consultations: None  Procedures/Studies: DG Chest 2 View  Result Date: 06/05/2021 CLINICAL DATA:  Chest pain EXAM: CHEST - 2 VIEW COMPARISON:  May 18, 2021 FINDINGS: Prior median sternotomy. The heart size and mediastinal contours are within normal limits. Aortic atherosclerosis. Pulmonary hyperinflation with apical predominant emphysematous change and chronic bronchitic changes. No new focal consolidation. No pleural effusion. No pneumothorax. Thoracic spondylosis. IMPRESSION: 1. No active cardiopulmonary disease. 2. Chronic changes of COPD. 3. Aortic Atherosclerosis (ICD10-I70.0) and Emphysema (ICD10-J43.9). Electronically Signed   By: Dahlia Bailiff MD   On: 06/05/2021 15:49   NM Pulmonary Perfusion  Result Date: 06/06/2021 CLINICAL DATA:  Respiratory failure.  Shortness of.  COPD. EXAM: NUCLEAR MEDICINE PERFUSION LUNG SCAN TECHNIQUE: Perfusion images were obtained in multiple projections after intravenous injection of radiopharmaceutical. Ventilation scans intentionally deferred if perfusion scan and chest x-ray adequate for interpretation during COVID 19 epidemic. RADIOPHARMACEUTICALS:  4.4 mCi Tc-37mMAA IV COMPARISON:  Chest  radiographs obtained yesterday. FINDINGS: Minimally heterogeneous perfusion of both lungs with no discrete perfusion defects seen. IMPRESSION: 1. Minimally heterogeneous perfusion of both lungs, compatible with the history of COPD. 2. No evidence of pulmonary embolism. Electronically Signed   By: SClaudie ReveringM.D.   On: 06/06/2021 16:44   DG Chest Portable 1 View  Result Date: 05/18/2021 CLINICAL DATA:  Shortness of breath EXAM: PORTABLE CHEST 1 VIEW COMPARISON:  04/05/2021, CT 04/05/2021 FINDINGS: Post sternotomy changes. Emphysematous disease and mild bronchitic change. No focal opacity or pleural effusion. Stable cardiomediastinal silhouette with aortic atherosclerosis. No pneumothorax. IMPRESSION: No active disease.  Emphysema and mild bronchitic changes Electronically Signed   By: KDonavan FoilM.D.   On: 05/18/2021 16:03   ECHOCARDIOGRAM COMPLETE  Result Date: 06/06/2021    ECHOCARDIOGRAM REPORT   Patient Name:   Daniel AAGAARDDate of Exam: 06/06/2021 Medical Rec #:  [id number]     Height:       67.0 in Accession #:    [id number]    Weight:       154.8 lb Date of Birth:  [birth year]    BSA:          1.814 m Patient Age:    89years       BP:           144/77 mmHg Patient Gender: M              HR:           76 bpm. Exam Location:  Inpatient Procedure: 2D Echo, Cardiac Doppler and Color Doppler Indications:    Elevated Troponin  History:        Patient has no prior history of Echocardiogram examinations.                 Prior CABG, COPD, Arrythmias:Atrial Fibrillation; Risk                 Factors:Hypertension, Dyslipidemia and Current Smoker. Stented                 coronary artery (From Hx), Hx of Covid-19 infection.  Sonographer:    BAlvino ChapelRCS Referring Phys: [id number] Point 1. There is exaggerated septal displacement with respiration as well as exaggerated respiratory variation in mitral inflow velocities. This suggests constrictive physiology, but may also be due to  increased work of breathing with COPD exacerbation or asthma. Left ventricular ejection fraction, by estimation, is 60 to 65%. The left ventricle has normal function. The left ventricle  has no regional wall motion abnormalities. Left ventricular diastolic parameters are consistent with Grade I diastolic dysfunction (impaired relaxation).  2. Right ventricular systolic function is normal. The right ventricular size is mildly enlarged.  3. Right atrial size was mildly dilated.  4. The mitral valve is normal in structure. No evidence of mitral valve regurgitation.  5. The aortic valve has an indeterminant number of cusps. There is moderate calcification of the aortic valve. There is moderate thickening of the aortic valve. Aortic valve regurgitation is not visualized. Mild to moderate aortic valve sclerosis/calcification is present, without any evidence of aortic stenosis.  6. The inferior vena cava is dilated in size with >50% respiratory variability, suggesting right atrial pressure of 8 mmHg. FINDINGS  Left Ventricle: There is exaggerated septal displacement with respiration as well as exaggerated respiratory variation in mitral inflow velocities. This suggests constrictive physiology, but may also be due to increased work of breathing with COPD exacerbation or asthma. Left ventricular ejection fraction, by estimation, is 60 to 65%. The left ventricle has normal function. The left ventricle has no regional wall motion abnormalities. The left ventricular internal cavity size was normal in size. There is no left ventricular hypertrophy. Abnormal (paradoxical) septal motion consistent with post-operative status. Left ventricular diastolic parameters are consistent with Grade I diastolic dysfunction (impaired relaxation). Indeterminate filling pressures. Right Ventricle: The right ventricular size is mildly enlarged. No increase in right ventricular wall thickness. Right ventricular systolic function is normal. Left  Atrium: Left atrial size was normal in size. Right Atrium: Right atrial size was mildly dilated. Pericardium: There is no evidence of pericardial effusion. Presence of pericardial fat pad. Mitral Valve: The mitral valve is normal in structure. No evidence of mitral valve regurgitation. Tricuspid Valve: The tricuspid valve is normal in structure. Tricuspid valve regurgitation is not demonstrated. Aortic Valve: The aortic valve has an indeterminant number of cusps. There is moderate calcification of the aortic valve. There is moderate thickening of the aortic valve. Aortic valve regurgitation is not visualized. Mild to moderate aortic valve sclerosis/calcification is present, without any evidence of aortic stenosis. Pulmonic Valve: The pulmonic valve was not well visualized. Pulmonic valve regurgitation is not visualized. Aorta: The aortic root and ascending aorta are structurally normal, with no evidence of dilitation. Venous: The inferior vena cava is dilated in size with greater than 50% respiratory variability, suggesting right atrial pressure of 8 mmHg. IAS/Shunts: No atrial level shunt detected by color flow Doppler.  LEFT VENTRICLE PLAX 2D LVIDd:         4.30 cm  Diastology LVIDs:         2.70 cm  LV e' medial:    7.13 cm/s LV PW:         1.10 cm  LV E/e' medial:  12.1 LV IVS:        0.90 cm  LV e' lateral:   6.85 cm/s LVOT diam:     1.60 cm  LV E/e' lateral: 12.5 LV SV:         55 LV SV Index:   30 LVOT Area:     2.01 cm  RIGHT VENTRICLE RV S prime:     10.00 cm/s TAPSE (M-mode): 1.4 cm LEFT ATRIUM             Index       RIGHT ATRIUM           Index LA diam:        3.20 cm 1.76 cm/m  RA  Area:     20.10 cm LA Vol (A2C):   42.9 ml 23.65 ml/m RA Volume:   58.40 ml  32.20 ml/m LA Vol (A4C):   48.9 ml 26.96 ml/m LA Biplane Vol: 48.8 ml 26.91 ml/m  AORTIC VALVE LVOT Vmax:   133.00 cm/s LVOT Vmean:  77.100 cm/s LVOT VTI:    0.273 m  AORTA Ao Root diam: 3.50 cm MITRAL VALVE MV Area (PHT): 2.81 cm    SHUNTS MV  Decel Time: 270 msec    Systemic VTI:  0.27 m MV E velocity: 85.90 cm/s  Systemic Diam: 1.60 cm MV A velocity: 96.85 cm/s MV E/A ratio:  0.89 Mihai Croitoru MD Electronically signed by Sanda Klein MD Signature Date/Time: 06/06/2021/5:33:55 PM    Final       Subjective:  Patient seen and examined the bedside this morning.  Medically stable for discharge today.  I called and discussed with the stepdaughter on phone   Discharge Exam: Vitals:   06/08/21 0406 06/08/21 0742  BP: 118/62   Pulse: 66 89  Resp: 16 20  Temp: 97.6 F (36.4 C)   SpO2: 100% 98%   Vitals:   06/07/21 2046 06/08/21 0406 06/08/21 0742 06/08/21 0746  BP: (!) 142/79 118/62    Pulse: 71 66 89   Resp: '20 16 20   '$ Temp: 97.6 F (36.4 C) 97.6 F (36.4 C)    TempSrc:      SpO2: (!) 57% 100% 98%   Weight:    69.4 kg    General: Pt is alert, awake, not in acute distress Cardiovascular: RRR, S1/S2 +, no rubs, no gallops Respiratory: CTA bilaterally, no wheezing, no rhonchi Abdominal: Soft, NT, ND, bowel sounds + Extremities: no edema, no cyanosis    The results of significant diagnostics from this hospitalization (including imaging, microbiology, ancillary and laboratory) are listed below for reference.     Microbiology: Recent Results (from the past 240 hour(s))  SARS CORONAVIRUS 2 (TAT 6-24 HRS) Nasopharyngeal Nasopharyngeal Swab     Status: Abnormal   Collection Time: 06/05/21  8:13 PM   Specimen: Nasopharyngeal Swab  Result Value Ref Range Status   SARS Coronavirus 2 POSITIVE (A) NEGATIVE Final    Comment: (NOTE) SARS-CoV-2 target nucleic acids are DETECTED.  The SARS-CoV-2 RNA is generally detectable in upper and lower respiratory specimens during the acute phase of infection. Positive results are indicative of the presence of SARS-CoV-2 RNA. Clinical correlation with patient history and other diagnostic information is  necessary to determine patient infection status. Positive results do not rule  out bacterial infection or co-infection with other viruses.  The expected result is Negative.  Fact Sheet for Patients: SugarRoll.be  Fact Sheet for Healthcare Providers: https://www.woods-mathews.com/  This test is not yet approved or cleared by the [place] FDA and  has been authorized for detection and/or diagnosis of SARS-CoV-2 by FDA under an Emergency Use Authorization (EUA). This EUA will remain  in effect (meaning this test can be used) for the duration of the COVID-19 declaration under Section 564(b)(1) of the Act, 21 U. S.C. section 360bbb-3(b)(1), unless the authorization is terminated or revoked sooner.   Performed at [facility], [place] [address] [place], [place] [address]      Labs: BNP (last 3 results) Recent Labs    04/05/21 1847 05/18/21 1548 06/05/21 1527  BNP 190.0* 241.0* [id number]*   Basic Metabolic Panel: Recent Labs  Lab 06/05/21 1527 06/05/21 2230 06/06/21 0130 06/07/21 0301  NA  140  --  139 141  K 3.6  --  4.3 3.7  CL 101  --  101 95*  CO2 33*  --  32 39*  GLUCOSE 110*  --  260* 172*  BUN 20  --  21 35*  CREATININE 1.33*  --  1.47* 1.44*  CALCIUM 9.0  --  8.7* 8.9  MG  --  1.9 1.9  --   PHOS  --   --  2.5  --    Liver Function Tests: Recent Labs  Lab 06/06/21 0130  AST 14*  ALT 18  ALKPHOS 46  BILITOT 0.8  PROT 5.0*  ALBUMIN 2.7*   No results for input(s): LIPASE, AMYLASE in the last 168 hours. No results for input(s): AMMONIA in the last 168 hours. CBC: Recent Labs  Lab 06/05/21 1527 06/06/21 0130 06/07/21 0301 06/08/21 0349  WBC 21.3* 20.5* 14.1* 14.3*  NEUTROABS  --   --  13.4* 13.6*  HGB 12.5* 11.5* 11.8* 13.5  HCT 40.4 36.5* 37.2* 42.2  MCV 98.5 98.4 97.1 96.6  PLT 135* 110* 116* 140*   Cardiac Enzymes: No results for input(s): CKTOTAL, CKMB, CKMBINDEX, TROPONINI in the last 168 hours. BNP: Invalid input(s): POCBNP CBG: No results for input(s): GLUCAP in the  last 168 hours. D-Dimer Recent Labs    06/05/21 2230  DDIMER 0.74*   Hgb A1c No results for input(s): HGBA1C in the last 72 hours. Lipid Profile No results for input(s): CHOL, HDL, LDLCALC, TRIG, CHOLHDL, LDLDIRECT in the last 72 hours. Thyroid function studies No results for input(s): TSH, T4TOTAL, T3FREE, THYROIDAB in the last 72 hours.  Invalid input(s): FREET3 Anemia work up No results for input(s): VITAMINB12, FOLATE, FERRITIN, TIBC, IRON, RETICCTPCT in the last 72 hours. Urinalysis    Component Value Date/Time   COLORURINE YELLOW 03/28/2020 1601   APPEARANCEUR HAZY (A) 03/28/2020 1601   LABSPEC 1.009 03/28/2020 1601   PHURINE 7.0 03/28/2020 1601   GLUCOSEU NEGATIVE 03/28/2020 1601   HGBUR MODERATE (A) 03/28/2020 1601   BILIRUBINUR NEGATIVE 03/28/2020 1601   KETONESUR NEGATIVE 03/28/2020 1601   PROTEINUR NEGATIVE 03/28/2020 1601   NITRITE NEGATIVE 03/28/2020 1601   LEUKOCYTESUR MODERATE (A) 03/28/2020 1601   Sepsis Labs Invalid input(s): PROCALCITONIN,  WBC,  LACTICIDVEN Microbiology Recent Results (from the past 240 hour(s))  SARS CORONAVIRUS 2 (TAT 6-24 HRS) Nasopharyngeal Nasopharyngeal Swab     Status: Abnormal   Collection Time: 06/05/21  8:13 PM   Specimen: Nasopharyngeal Swab  Result Value Ref Range Status   SARS Coronavirus 2 POSITIVE (A) NEGATIVE Final    Comment: (NOTE) SARS-CoV-2 target nucleic acids are DETECTED.  The SARS-CoV-2 RNA is generally detectable in upper and lower respiratory specimens during the acute phase of infection. Positive results are indicative of the presence of SARS-CoV-2 RNA. Clinical correlation with patient history and other diagnostic information is  necessary to determine patient infection status. Positive results do not rule out bacterial infection or co-infection with other viruses.  The expected result is Negative.  Fact Sheet for Patients: SugarRoll.be  Fact Sheet for Healthcare  Providers: https://www.woods-mathews.com/  This test is not yet approved or cleared by the [place] FDA and  has been authorized for detection and/or diagnosis of SARS-CoV-2 by FDA under an Emergency Use Authorization (EUA). This EUA will remain  in effect (meaning this test can be used) for the duration of the COVID-19 declaration under Section 564(b)(1) of the Act, 21 U. S.C. section 360bbb-3(b)(1), unless the authorization is terminated  or revoked sooner.   Performed at [facility], [place] [address], [place], [place] [address]     Please note: You were cared for by a hospitalist during your hospital stay. Once you are discharged, your primary care physician will handle any further medical issues. Please note that NO REFILLS for any discharge medications will be authorized once you are discharged, as it is imperative that you return to your primary care physician (or establish a relationship with a primary care physician if you do not have one) for your post hospital discharge needs so that they can reassess your need for medications and monitor your lab values.    Time coordinating discharge: 40 minutes  SIGNED:   Shelly Coss, MD  [facility] 06/08/2021, 10:27 AM Pager [phone]  If 7PM-7AM, please contact night-coverage [email or web address] Password [email or web address]

## 2021-06-08 NOTE — Progress Notes (Signed)
AVS provided to pt and reviewed. All questions answered.

## 2021-06-09 ENCOUNTER — Encounter: Payer: Self-pay | Admitting: *Deleted

## 2021-06-09 ENCOUNTER — Other Ambulatory Visit: Payer: Self-pay | Admitting: *Deleted

## 2021-06-09 NOTE — Patient Outreach (Addendum)
[facility]) Care Management  06/09/2021  Daniel Barker 1946/07/16 [id number]  [facility] outreach to post hospital referred patient   Referral Date: 05/25/21 Referral Source: [facility] hospital liaison Referral Reason: post hospital/complex care  Insurance: united [employer] medicare    7/9-12/22 dyspnea on exertion dx acute hypoxic respiratory failure secondary to COPD exacerbation and in the setting of recent COVID infection  6/21-26/2022 copd exacerbation, atrial fibrillation, covid infection       Successful outreach to the home number [phone]   Patient is able to verify HIPAA ([place]) identifiers Reviewed and addressed the purpose of the follow up call with the patient   Consent: [facility] ([place]) RN CM reviewed [facility] services with patient. Patient gave verbal consent for services.   post hospital assessment [name] reports he feels better  He is jovial during this outreach He continues to be Hard of hearing [facility])  He reports returning the hospital related to his COPD He denies cost concerns with filling all discharge medications  reports he can make them & no assist from RN CM needed but will call RN CM prn   Social lives with stepdaughter, his wife has dementia, has also assist from grandchildren  Plans Patient agrees to care plan and follow up within the next 30 business days  Goals Addressed               This Visit's Progress     Patient Stated     [facility]) Make and Keep All Appointments (pt-stated)   On track     Timeframe:  Short-Term Goal Priority:  Medium Start Date:          06/02/21                   Expected End Date:      07/28/21                 Follow Up Date 06/23/21   Hearing Knowledge  - ask family or friend for a ride - call to cancel if needed     Notes:  06/09/21 d/c home on 06/08/21 post hospital follow up 06/09/21 pending making his appointments as he had to cancel  the previous ones Decline assist from RN CM  06/05/21 re admission 06/02/21 pending making follow up appointments       [facility]) Track and Manage My Symptoms-COPD (pt-stated)   Not on track     Timeframe:  Long-Range Goal Priority:  High Start Date:            06/09/21                 Expected End Date:        08/27/21               Follow Up Date 06/23/21 Barriers: Hearing Knowledge     - eliminate symptom triggers at home - follow rescue plan if symptoms flare-up - keep follow-up appointments     Notes:  06/09/21 doing better, monitoring for increase symptoms, taking medications, plans to return to see pulmonologist         Joelene Millin L. Lavina Hamman, RN, BSN, [place] Coordinator Office number [phone] [facility] number [phone] Fax number [phone]

## 2021-06-10 ENCOUNTER — Other Ambulatory Visit: Payer: Self-pay | Admitting: *Deleted

## 2021-06-10 NOTE — Patient Outreach (Signed)
[facility]) Care Management  06/10/2021  Daniel Barker 15-Apr-1946 [id number]   [place] coordination  [facility] multidisciplinary case discussion template completed and sent to [place] after review of EPIC chart information plus outreach to patient on 06/09/21  Plan [facility] RN CM will follow up with patient within the next 30 business days  Arabelle Bollig L. Lavina Hamman, RN, BSN, [place] Coordinator Office number [phone] Mobile number [phone]  [facility] number [phone] Fax number [phone]

## 2021-06-17 ENCOUNTER — Encounter: Payer: Self-pay | Admitting: *Deleted
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
## 2021-06-20 DIAGNOSIS — J449 Chronic obstructive pulmonary disease, unspecified: Secondary | ICD-10-CM | POA: Diagnosis not present

## 2021-06-21 ENCOUNTER — Other Ambulatory Visit: Payer: Self-pay

## 2021-06-21 ENCOUNTER — Emergency Department (HOSPITAL_COMMUNITY): Payer: Medicare Other

## 2021-06-21 ENCOUNTER — Encounter (HOSPITAL_COMMUNITY): Payer: Self-pay

## 2021-06-21 ENCOUNTER — Inpatient Hospital Stay (HOSPITAL_COMMUNITY)
Admission: EM | Admit: 2021-06-21 | Discharge: 2021-06-23 | DRG: 189 | Disposition: A | Discharge status: Home Health | Payer: Medicare Other | Attending: Family Medicine | Admitting: Family Medicine

## 2021-06-21 DIAGNOSIS — Z8616 Personal history of COVID-19: Secondary | ICD-10-CM | POA: Diagnosis not present

## 2021-06-21 DIAGNOSIS — Z832 Family history of diseases of the blood and blood-forming organs and certain disorders involving the immune mechanism: Secondary | ICD-10-CM

## 2021-06-21 DIAGNOSIS — E1122 Type 2 diabetes mellitus with diabetic chronic kidney disease: Secondary | ICD-10-CM | POA: Diagnosis present

## 2021-06-21 DIAGNOSIS — N189 Chronic kidney disease, unspecified: Secondary | ICD-10-CM | POA: Diagnosis present

## 2021-06-21 DIAGNOSIS — I130 Hypertensive heart and chronic kidney disease with heart failure and stage 1 through stage 4 chronic kidney disease, or unspecified chronic kidney disease: Secondary | ICD-10-CM | POA: Diagnosis not present

## 2021-06-21 DIAGNOSIS — I482 Chronic atrial fibrillation, unspecified: Secondary | ICD-10-CM | POA: Diagnosis present

## 2021-06-21 DIAGNOSIS — J9622 Acute and chronic respiratory failure with hypercapnia: Secondary | ICD-10-CM | POA: Diagnosis present

## 2021-06-21 DIAGNOSIS — I2510 Atherosclerotic heart disease of native coronary artery without angina pectoris: Secondary | ICD-10-CM | POA: Diagnosis not present

## 2021-06-21 DIAGNOSIS — J9621 Acute and chronic respiratory failure with hypoxia: Secondary | ICD-10-CM | POA: Diagnosis not present

## 2021-06-21 DIAGNOSIS — J441 Chronic obstructive pulmonary disease with (acute) exacerbation: Secondary | ICD-10-CM | POA: Diagnosis not present

## 2021-06-21 DIAGNOSIS — Z7951 Long term (current) use of inhaled steroids: Secondary | ICD-10-CM

## 2021-06-21 DIAGNOSIS — N400 Enlarged prostate without lower urinary tract symptoms: Secondary | ICD-10-CM | POA: Diagnosis present

## 2021-06-21 DIAGNOSIS — N179 Acute kidney failure, unspecified: Secondary | ICD-10-CM | POA: Diagnosis not present

## 2021-06-21 DIAGNOSIS — J439 Emphysema, unspecified: Secondary | ICD-10-CM | POA: Diagnosis present

## 2021-06-21 DIAGNOSIS — D638 Anemia in other chronic diseases classified elsewhere: Secondary | ICD-10-CM | POA: Diagnosis present

## 2021-06-21 DIAGNOSIS — I517 Cardiomegaly: Secondary | ICD-10-CM | POA: Diagnosis not present

## 2021-06-21 DIAGNOSIS — Z825 Family history of asthma and other chronic lower respiratory diseases: Secondary | ICD-10-CM

## 2021-06-21 DIAGNOSIS — I480 Paroxysmal atrial fibrillation: Secondary | ICD-10-CM | POA: Diagnosis present

## 2021-06-21 DIAGNOSIS — R0689 Other abnormalities of breathing: Secondary | ICD-10-CM | POA: Diagnosis not present

## 2021-06-21 DIAGNOSIS — J45909 Unspecified asthma, uncomplicated: Secondary | ICD-10-CM | POA: Diagnosis not present

## 2021-06-21 DIAGNOSIS — I10 Essential (primary) hypertension: Secondary | ICD-10-CM | POA: Diagnosis present

## 2021-06-21 DIAGNOSIS — E860 Dehydration: Secondary | ICD-10-CM | POA: Diagnosis present

## 2021-06-21 DIAGNOSIS — I959 Hypotension, unspecified: Secondary | ICD-10-CM | POA: Diagnosis not present

## 2021-06-21 DIAGNOSIS — E861 Hypovolemia: Secondary | ICD-10-CM | POA: Diagnosis not present

## 2021-06-21 DIAGNOSIS — Z79899 Other long term (current) drug therapy: Secondary | ICD-10-CM

## 2021-06-21 DIAGNOSIS — Z9981 Dependence on supplemental oxygen: Secondary | ICD-10-CM | POA: Diagnosis not present

## 2021-06-21 DIAGNOSIS — R069 Unspecified abnormalities of breathing: Secondary | ICD-10-CM | POA: Diagnosis not present

## 2021-06-21 DIAGNOSIS — J9611 Chronic respiratory failure with hypoxia: Secondary | ICD-10-CM | POA: Diagnosis present

## 2021-06-21 DIAGNOSIS — D649 Anemia, unspecified: Secondary | ICD-10-CM | POA: Diagnosis present

## 2021-06-21 DIAGNOSIS — R0602 Shortness of breath: Secondary | ICD-10-CM

## 2021-06-21 DIAGNOSIS — J449 Chronic obstructive pulmonary disease, unspecified: Secondary | ICD-10-CM | POA: Diagnosis present

## 2021-06-21 DIAGNOSIS — Z8249 Family history of ischemic heart disease and other diseases of the circulatory system: Secondary | ICD-10-CM | POA: Diagnosis not present

## 2021-06-21 DIAGNOSIS — R0902 Hypoxemia: Secondary | ICD-10-CM

## 2021-06-21 DIAGNOSIS — A419 Sepsis, unspecified organism: Secondary | ICD-10-CM

## 2021-06-21 DIAGNOSIS — Z951 Presence of aortocoronary bypass graft: Secondary | ICD-10-CM | POA: Diagnosis not present

## 2021-06-21 DIAGNOSIS — I700 Atherosclerosis of aorta: Secondary | ICD-10-CM | POA: Diagnosis not present

## 2021-06-21 DIAGNOSIS — Z7902 Long term (current) use of antithrombotics/antiplatelets: Secondary | ICD-10-CM

## 2021-06-21 DIAGNOSIS — I5032 Chronic diastolic (congestive) heart failure: Secondary | ICD-10-CM | POA: Diagnosis present

## 2021-06-21 DIAGNOSIS — E7800 Pure hypercholesterolemia, unspecified: Secondary | ICD-10-CM | POA: Diagnosis present

## 2021-06-21 DIAGNOSIS — H9190 Unspecified hearing loss, unspecified ear: Secondary | ICD-10-CM | POA: Diagnosis not present

## 2021-06-21 DIAGNOSIS — E119 Type 2 diabetes mellitus without complications: Secondary | ICD-10-CM

## 2021-06-21 DIAGNOSIS — D631 Anemia in chronic kidney disease: Secondary | ICD-10-CM | POA: Diagnosis not present

## 2021-06-21 DIAGNOSIS — Z743 Need for continuous supervision: Secondary | ICD-10-CM | POA: Diagnosis not present

## 2021-06-21 DIAGNOSIS — U071 COVID-19: Secondary | ICD-10-CM | POA: Diagnosis not present

## 2021-06-21 DIAGNOSIS — N1831 Chronic kidney disease, stage 3a: Secondary | ICD-10-CM | POA: Diagnosis present

## 2021-06-21 DIAGNOSIS — Z888 Allergy status to other drugs, medicaments and biological substances status: Secondary | ICD-10-CM

## 2021-06-21 DIAGNOSIS — I499 Cardiac arrhythmia, unspecified: Secondary | ICD-10-CM | POA: Diagnosis not present

## 2021-06-21 DIAGNOSIS — Z87891 Personal history of nicotine dependence: Secondary | ICD-10-CM

## 2021-06-21 DIAGNOSIS — R6889 Other general symptoms and signs: Secondary | ICD-10-CM | POA: Diagnosis not present

## 2021-06-21 LAB — CBC WITH DIFFERENTIAL/PLATELET
Abs Immature Granulocytes: 0.14 10*3/uL — ABNORMAL HIGH (ref 0.00–0.07)
Basophils Absolute: 0.1 10*3/uL (ref 0.0–0.1)
Basophils Relative: 1 %
Eosinophils Absolute: 0.1 10*3/uL (ref 0.0–0.5)
Eosinophils Relative: 1 %
HCT: 38.1 % — ABNORMAL LOW (ref 39.0–52.0)
Hemoglobin: 11.9 g/dL — ABNORMAL LOW (ref 13.0–17.0)
Immature Granulocytes: 1 %
Lymphocytes Relative: 12 %
Lymphs Abs: 1.6 10*3/uL (ref 0.7–4.0)
MCH: 31.2 pg (ref 26.0–34.0)
MCHC: 31.2 g/dL (ref 30.0–36.0)
MCV: 100 fL (ref 80.0–100.0)
Monocytes Absolute: 1.1 10*3/uL — ABNORMAL HIGH (ref 0.1–1.0)
Monocytes Relative: 8 %
Neutro Abs: 10.4 10*3/uL — ABNORMAL HIGH (ref 1.7–7.7)
Neutrophils Relative %: 77 %
Platelets: 245 10*3/uL (ref 150–400)
RBC: 3.81 MIL/uL — ABNORMAL LOW (ref 4.22–5.81)
RDW: 13.7 % (ref 11.5–15.5)
WBC: 13.4 10*3/uL — ABNORMAL HIGH (ref 4.0–10.5)
nRBC: 0 % (ref 0.0–0.2)

## 2021-06-21 LAB — URINALYSIS, ROUTINE W REFLEX MICROSCOPIC
Bilirubin Urine: NEGATIVE
Glucose, UA: NEGATIVE mg/dL
Hgb urine dipstick: NEGATIVE
Ketones, ur: 5 mg/dL — AB
Leukocytes,Ua: NEGATIVE
Nitrite: NEGATIVE
Protein, ur: NEGATIVE mg/dL
Specific Gravity, Urine: 1.018 (ref 1.005–1.030)
pH: 5 (ref 5.0–8.0)

## 2021-06-21 LAB — LACTIC ACID, PLASMA
Lactic Acid, Venous: 0.8 mmol/L (ref 0.5–1.9)
Lactic Acid, Venous: 1.1 mmol/L (ref 0.5–1.9)
Lactic Acid, Venous: 1.1 mmol/L (ref 0.5–1.9)

## 2021-06-21 LAB — COMPREHENSIVE METABOLIC PANEL
ALT: 16 U/L (ref 0–44)
AST: 15 U/L (ref 15–41)
Albumin: 3.2 g/dL — ABNORMAL LOW (ref 3.5–5.0)
Alkaline Phosphatase: 58 U/L (ref 38–126)
Anion gap: 8 (ref 5–15)
BUN: 29 mg/dL — ABNORMAL HIGH (ref 8–23)
CO2: 37 mmol/L — ABNORMAL HIGH (ref 22–32)
Calcium: 8.7 mg/dL — ABNORMAL LOW (ref 8.9–10.3)
Chloride: 92 mmol/L — ABNORMAL LOW (ref 98–111)
Creatinine, Ser: 1.88 mg/dL — ABNORMAL HIGH (ref 0.61–1.24)
GFR, Estimated: 37 mL/min — ABNORMAL LOW (ref >60)
Glucose, Bld: 116 mg/dL — ABNORMAL HIGH (ref 70–99)
Potassium: 3.9 mmol/L (ref 3.5–5.1)
Sodium: 137 mmol/L (ref 135–145)
Total Bilirubin: 1.1 mg/dL (ref 0.3–1.2)
Total Protein: 5.5 g/dL — ABNORMAL LOW (ref 6.5–8.1)

## 2021-06-21 LAB — BLOOD GAS, VENOUS
Acid-Base Excess: 12.1 mmol/L — ABNORMAL HIGH (ref 0.0–2.0)
Bicarbonate: 33.1 mmol/L — ABNORMAL HIGH (ref 20.0–28.0)
FIO2: 28
O2 Saturation: 68.9 %
Patient temperature: 36.4
pCO2, Ven: 73.5 mmHg (ref 44.0–60.0)
pH, Ven: 7.336 (ref 7.250–7.430)
pO2, Ven: 40.2 mmHg (ref 32.0–45.0)

## 2021-06-21 LAB — BRAIN NATRIURETIC PEPTIDE
B Natriuretic Peptide: 98 pg/mL (ref 0.0–100.0)
B Natriuretic Peptide: 112 pg/mL — ABNORMAL HIGH (ref 0.0–100.0)

## 2021-06-21 LAB — PROTIME-INR
INR: 1 (ref 0.8–1.2)
Prothrombin Time: 13.5 seconds (ref 11.4–15.2)

## 2021-06-21 LAB — APTT: aPTT: 32 seconds (ref 24–36)

## 2021-06-21 LAB — MRSA NEXT GEN BY PCR, NASAL: MRSA by PCR Next Gen: NOT DETECTED

## 2021-06-21 MED ORDER — TORSEMIDE 20 MG PO TABS
50.0000 mg | ORAL_TABLET | Freq: Every day | ORAL | Status: DC
  Administered 2021-06-23: 50 mg via ORAL
  Filled 2021-06-21: qty 3

## 2021-06-21 MED ORDER — ACETAMINOPHEN 650 MG RE SUPP: 650.0000 mg | Freq: Four times a day (QID) | RECTAL | Status: DC | PRN

## 2021-06-21 MED ORDER — ONDANSETRON HCL 4 MG/2ML IJ SOLN: 4.0000 mg | Freq: Four times a day (QID) | INTRAMUSCULAR | Status: DC | PRN

## 2021-06-21 MED ORDER — GUAIFENESIN-DM 100-10 MG/5ML PO SYRP
10.0000 mL | ORAL_SOLUTION | Freq: Three times a day (TID) | ORAL | Status: DC
  Administered 2021-06-21 – 2021-06-23 (×6): 10 mL via ORAL
  Filled 2021-06-21 (×6): qty 10

## 2021-06-21 MED ORDER — HYDROMORPHONE HCL 1 MG/ML IJ SOLN: 0.5000 mg | INTRAMUSCULAR | Status: DC | PRN

## 2021-06-21 MED ORDER — LACTATED RINGERS IV BOLUS (SEPSIS)
250.0000 mL | Freq: Once | INTRAVENOUS | Status: AC
  Administered 2021-06-21: 250 mL via INTRAVENOUS

## 2021-06-21 MED ORDER — ACETAMINOPHEN 325 MG PO TABS: 650.0000 mg | ORAL_TABLET | Freq: Four times a day (QID) | ORAL | Status: DC | PRN

## 2021-06-21 MED ORDER — SODIUM CHLORIDE 0.9 % IV SOLN: INTRAVENOUS | Status: AC

## 2021-06-21 MED ORDER — ALBUTEROL SULFATE HFA 108 (90 BASE) MCG/ACT IN AERS: 2.0000 | INHALATION_SPRAY | RESPIRATORY_TRACT | Status: DC | PRN

## 2021-06-21 MED ORDER — OXYCODONE HCL 5 MG PO TABS
5.0000 mg | ORAL_TABLET | ORAL | Status: DC | PRN
Start: 2021-06-21 — End: 2021-06-23

## 2021-06-21 MED ORDER — SODIUM CHLORIDE 0.9% FLUSH
3.0000 mL | Freq: Two times a day (BID) | INTRAVENOUS | Status: DC
  Administered 2021-06-22 – 2021-06-23 (×3): 3 mL via INTRAVENOUS

## 2021-06-21 MED ORDER — SODIUM CHLORIDE 0.9 % IV BOLUS
1000.0000 mL | Freq: Once | INTRAVENOUS | Status: AC
  Administered 2021-06-21: 1000 mL via INTRAVENOUS

## 2021-06-21 MED ORDER — SENNOSIDES-DOCUSATE SODIUM 8.6-50 MG PO TABS: 1.0000 | ORAL_TABLET | Freq: Every evening | ORAL | Status: DC | PRN

## 2021-06-21 MED ORDER — ASCORBIC ACID 500 MG PO TABS
500.0000 mg | ORAL_TABLET | Freq: Every day | ORAL | Status: DC
  Administered 2021-06-22 – 2021-06-23 (×2): 500 mg via ORAL
  Filled 2021-06-21 (×2): qty 1

## 2021-06-21 MED ORDER — SODIUM CHLORIDE 0.9 % IV SOLN: INTRAVENOUS | Status: DC

## 2021-06-21 MED ORDER — PROPAFENONE HCL 225 MG PO TABS
225.0000 mg | ORAL_TABLET | Freq: Three times a day (TID) | ORAL | Status: DC
  Administered 2021-06-21 – 2021-06-23 (×6): 225 mg via ORAL
  Filled 2021-06-21 (×13): qty 1

## 2021-06-21 MED ORDER — SODIUM CHLORIDE 0.9 % IV SOLN: 250.0000 mL | INTRAVENOUS | Status: DC | PRN

## 2021-06-21 MED ORDER — SODIUM CHLORIDE 0.9 % IV SOLN
2.0000 g | Freq: Two times a day (BID) | INTRAVENOUS | Status: DC
  Administered 2021-06-21: 2 g via INTRAVENOUS
  Filled 2021-06-21: qty 2

## 2021-06-21 MED ORDER — CLOPIDOGREL BISULFATE 75 MG PO TABS
75.0000 mg | ORAL_TABLET | Freq: Every day | ORAL | Status: DC
  Administered 2021-06-22 – 2021-06-23 (×2): 75 mg via ORAL
  Filled 2021-06-21 (×2): qty 1

## 2021-06-21 MED ORDER — PANTOPRAZOLE SODIUM 40 MG PO TBEC
40.0000 mg | DELAYED_RELEASE_TABLET | Freq: Two times a day (BID) | ORAL | Status: DC
  Administered 2021-06-21 – 2021-06-23 (×4): 40 mg via ORAL
  Filled 2021-06-21 (×4): qty 1

## 2021-06-21 MED ORDER — BISACODYL 5 MG PO TBEC: 5.0000 mg | DELAYED_RELEASE_TABLET | Freq: Every day | ORAL | Status: DC | PRN

## 2021-06-21 MED ORDER — LACTATED RINGERS IV SOLN: INTRAVENOUS | Status: DC

## 2021-06-21 MED ORDER — LACTATED RINGERS IV BOLUS (SEPSIS)
1000.0000 mL | Freq: Once | INTRAVENOUS | Status: AC
  Administered 2021-06-21: 1000 mL via INTRAVENOUS

## 2021-06-21 MED ORDER — VANCOMYCIN HCL 1500 MG/300ML IV SOLN
1500.0000 mg | Freq: Once | INTRAVENOUS | Status: AC
  Administered 2021-06-21: 1500 mg via INTRAVENOUS
  Filled 2021-06-21: qty 300

## 2021-06-21 MED ORDER — PROPRANOLOL HCL 20 MG PO TABS
40.0000 mg | ORAL_TABLET | Freq: Two times a day (BID) | ORAL | Status: DC
  Administered 2021-06-22 – 2021-06-23 (×3): 40 mg via ORAL
  Filled 2021-06-21 (×3): qty 2

## 2021-06-21 MED ORDER — SODIUM CHLORIDE 0.9 % IV SOLN
2.0000 g | Freq: Once | INTRAVENOUS | Status: AC
  Administered 2021-06-21: 2 g via INTRAVENOUS
  Filled 2021-06-21: qty 2

## 2021-06-21 MED ORDER — HYDRALAZINE HCL 20 MG/ML IJ SOLN: 10.0000 mg | INTRAMUSCULAR | Status: DC | PRN

## 2021-06-21 MED ORDER — SODIUM CHLORIDE 0.9 % IV BOLUS: 500.0000 mL | Freq: Once | INTRAVENOUS | Status: DC

## 2021-06-21 MED ORDER — VANCOMYCIN HCL 750 MG/150ML IV SOLN: 750.0000 mg | INTRAVENOUS | Status: DC

## 2021-06-21 MED ORDER — ONDANSETRON HCL 4 MG PO TABS: 4.0000 mg | ORAL_TABLET | Freq: Four times a day (QID) | ORAL | Status: DC | PRN

## 2021-06-21 MED ORDER — VANCOMYCIN HCL IN DEXTROSE 1-5 GM/200ML-% IV SOLN: 1000.0000 mg | Freq: Once | INTRAVENOUS | Status: DC

## 2021-06-21 MED ORDER — HEPARIN SODIUM (PORCINE) 5000 UNIT/ML IJ SOLN
5000.0000 [IU] | Freq: Three times a day (TID) | INTRAMUSCULAR | Status: DC
  Administered 2021-06-21 – 2021-06-23 (×6): 5000 [IU] via SUBCUTANEOUS
  Filled 2021-06-21 (×6): qty 1

## 2021-06-21 MED ORDER — FLUTICASONE FUROATE-VILANTEROL 200-25 MCG/INH IN AEPB
1.0000 | INHALATION_SPRAY | Freq: Every day | RESPIRATORY_TRACT | Status: DC
  Administered 2021-06-22 – 2021-06-23 (×2): 1 via RESPIRATORY_TRACT
  Filled 2021-06-21: qty 28

## 2021-06-21 MED ORDER — LEVALBUTEROL HCL 0.63 MG/3ML IN NEBU: 0.6300 mg | INHALATION_SOLUTION | Freq: Four times a day (QID) | RESPIRATORY_TRACT | Status: DC | PRN

## 2021-06-21 MED ORDER — MONTELUKAST SODIUM 10 MG PO TABS
10.0000 mg | ORAL_TABLET | Freq: Every day | ORAL | Status: DC
  Administered 2021-06-21 – 2021-06-22 (×2): 10 mg via ORAL
  Filled 2021-06-21 (×2): qty 1

## 2021-06-21 MED ORDER — TAMSULOSIN HCL 0.4 MG PO CAPS
0.4000 mg | ORAL_CAPSULE | Freq: Every day | ORAL | Status: DC
  Administered 2021-06-22 – 2021-06-23 (×2): 0.4 mg via ORAL
  Filled 2021-06-21 (×2): qty 1

## 2021-06-21 MED ORDER — IPRATROPIUM BROMIDE 0.02 % IN SOLN: 0.5000 mg | Freq: Four times a day (QID) | RESPIRATORY_TRACT | Status: DC | PRN

## 2021-06-21 MED ORDER — SODIUM CHLORIDE 0.9% FLUSH: 3.0000 mL | INTRAVENOUS | Status: DC | PRN

## 2021-06-21 MED ORDER — TRAZODONE HCL 50 MG PO TABS: 25.0000 mg | ORAL_TABLET | Freq: Every evening | ORAL | Status: DC | PRN

## 2021-06-21 MED ORDER — PROPRANOLOL HCL 10 MG PO TABS: 40.0000 mg | ORAL_TABLET | Freq: Two times a day (BID) | ORAL | Status: DC

## 2021-06-21 MED ORDER — METRONIDAZOLE 500 MG/100ML IV SOLN
500.0000 mg | Freq: Once | INTRAVENOUS | Status: AC
  Administered 2021-06-21: 500 mg via INTRAVENOUS
  Filled 2021-06-21: qty 100

## 2021-06-21 MED ORDER — ZINC SULFATE 220 (50 ZN) MG PO CAPS
220.0000 mg | ORAL_CAPSULE | Freq: Every day | ORAL | Status: DC
  Administered 2021-06-22 – 2021-06-23 (×2): 220 mg via ORAL
  Filled 2021-06-21 (×2): qty 1

## 2021-06-21 NOTE — ED Provider Notes (Signed)
[facility] EMERGENCY DEPARTMENT Provider Note   CSN: [id number] Arrival date & time: 06/21/21  1150     History Chief Complaint  Patient presents with   Shortness of Breath    Daniel Barker is a 75 y.o. male.  Patient brought in by EMS for shortness of breath.  Patient has a history of COPD normally on 2 L of oxygen but started to feel much more short of breath this morning.  Patient had 2 albuterol treatments at home.  EMS gave him a DuoNeb and 125 mg of Solu-Medrol.  Patient arrived here with a low temp of 97.5 respiratory rate 26.  Blood pressure low at 88/61.  He arrived on 4 L of oxygen was 100%.  We dialed him down to 2 L of oxygen his oxygen sats remained clear.  Past medical history significant for COPD coronary artery disease atrial fibrillation patient on Plavix.  Also significant for coronary artery disease history of a CABG chronic kidney disease hypertension no mention of congestive heart failure patient diagnosed with COVID several weeks ago.  Recently admitted July 9 for COPD exacerbation at [facility].  Also admitted June 21 for acute exacerbation of chronic obstructive disease.  Same admission on May 9.  Today's presentation very similar.  Other than the hypotension.  Patient hard of hearing but is talking fine.  Denies any lightheadedness or feeling like he is got a passout.  Just complaining of the shortness of breath.  Patient during his admission at the beginning of this month I had a VQ scan that was negative for pulmonary embolus.      Past Medical History:  Diagnosis Date   Asthma    Atrial fibrillation (HCC)    BPH (benign prostatic hyperplasia)    Cigarette smoker    CKD (chronic kidney disease)    COPD (chronic obstructive pulmonary disease) (HCC)    Coronary artery disease    Cough    High cholesterol    Hx of CABG    Hypercholesteremia    Hypertension    Localized edema    Stented coronary artery     Patient Active Problem List   Diagnosis Date  Noted   SOB (shortness of breath) 06/05/2021   Elevated troponin 06/05/2021   Chronic kidney disease, stage 3a (HCC)    Acute dyspnea    Acute on chronic respiratory failure with hypoxia and hypercapnia (HCC) 05/19/2021   Atrial fibrillation, chronic ([place]) 05/19/2021   Chronic kidney disease 05/19/2021   COVID-19 virus infection 05/18/2021   Acute exacerbation of chronic obstructive pulmonary disease (COPD) ([place]) 04/05/2021   Closed displaced spiral fracture of shaft of left humerus 12/23/2020   Acute on chronic respiratory failure with hypoxia ([place]) 09/02/2020   Urinary retention 07/08/2020   BPH (benign prostatic hyperplasia) 07/08/2020   COPD GOLD IV / group D  05/28/2020   Cigarette smoker 05/28/2020   AKI (acute kidney injury) ([place]) 06/03/2014   Anemia 06/03/2014   CAD (coronary atherosclerotic disease) 06/03/2014   Acute respiratory failure with hypercapnia ([place]) 06/01/2014   Diabetes mellitus ([place]) 06/01/2014   Hypertension 06/01/2014    Past Surgical History:  Procedure Laterality Date   CARDIAC SURGERY     CORONARY ARTERY BYPASS GRAFT         Family History  Problem Relation Age of Onset   Hypertension Mother    Clotting disorder Mother    COPD Father     Social History   Tobacco Use   Smoking status:  Former    Packs/day: 1.00    Years: 60.00    Pack years: 60.00    Types: Cigarettes    Quit date: 09/01/2020    Years since quitting: 0.8   Smokeless tobacco: Never  Vaping Use   Vaping Use: Never used  Substance Use Topics   Alcohol use: Never   Drug use: Never    Home Medications Prior to Admission medications   Medication Sig Start Date End Date Taking? Authorizing Provider  acetaminophen (TYLENOL) 500 MG tablet Take 1,000 mg by mouth every 6 (six) hours as needed.    [provider]  albuterol (PROVENTIL) (2.5 MG/3ML) 0.083% nebulizer solution INHALE THE CONTENTS OF 1 VIAL VIA NEBULIZER EVERY 6 HOURS AS NEEDED FOR WHEEZING OR SHORTNESS OF  BREATH 06/09/21   Tanda Rockers, MD  albuterol (VENTOLIN HFA) 108 (90 Base) MCG/ACT inhaler USE 2 PUFFS BY MOUTH EVERY 4 HOURS AS NEEDED 05/04/21   Tanda Rockers, MD  ascorbic acid (VITAMIN C) 500 MG tablet Take 1 tablet (500 mg total) by mouth daily. 05/24/21   Barton Dubois, MD  budesonide-formoterol [facility]) 160-4.5 MCG/ACT inhaler Take 2 puffs first thing in am and then another 2 puffs about 12 hours later. 10/27/20   Tanda Rockers, MD  clopidogrel (PLAVIX) 75 MG tablet Take 75 mg by mouth daily.    [provider]  fenofibrate micronized (LOFIBRA) 134 MG capsule Take 134 mg by mouth daily before breakfast.    [provider]  guaiFENesin-dextromethorphan (ROBITUSSIN DM) 100-10 MG/5ML syrup Take 5 mLs by mouth every 4 (four) hours as needed for cough. 05/23/21   Barton Dubois, MD  montelukast (SINGULAIR) 10 MG tablet Take 10 mg by mouth at bedtime.    [provider]  pantoprazole (PROTONIX) 40 MG tablet Take 1 tablet (40 mg total) by mouth 2 (two) times daily. 05/23/21   Barton Dubois, MD  propafenone (RYTHMOL) 225 MG tablet Take 1 tablet (225 mg total) by mouth every 8 (eight) hours. 02/03/21   Josue Hector, MD  propranolol (INDERAL) 40 MG tablet Take 40 mg by mouth 2 (two) times daily. 05/05/21   [provider]  tamsulosin (FLOMAX) 0.4 MG CAPS capsule Take 1 capsule (0.4 mg total) by mouth daily. 07/08/20   McKenzie, Candee Furbish, MD  torsemide (DEMADEX) 100 MG tablet Take 0.5 tablets (50 mg total) by mouth daily. 04/08/21   Roxan Hockey, MD  zinc sulfate 220 (50 Zn) MG capsule Take 1 capsule (220 mg total) by mouth daily. 05/24/21   Barton Dubois, MD    Allergies    Lipitor [atorvastatin]  Review of Systems   Review of Systems  Constitutional:  Negative for chills and fever.  HENT:  Positive for hearing loss. Negative for ear pain and sore throat.   Eyes:  Negative for pain and visual disturbance.  Respiratory:  Positive for cough, shortness of  breath and wheezing.   Cardiovascular:  Negative for chest pain and palpitations.  Gastrointestinal:  Negative for abdominal pain and vomiting.  Genitourinary:  Negative for dysuria and hematuria.  Musculoskeletal:  Negative for arthralgias and back pain.  Skin:  Negative for color change and rash.  Neurological:  Negative for seizures and syncope.  All other systems reviewed and are negative.  Physical Exam Updated Vital Signs BP (!) 86/61   Pulse 60   Temp (!) 97.5 F (36.4 C) (Oral)   Resp (!) 21   SpO2 98%   Physical Exam Vitals and  nursing note reviewed.  Constitutional:      Appearance: Normal appearance. He is well-developed.  HENT:     Head: Normocephalic and atraumatic.     Mouth/Throat:     Mouth: Mucous membranes are dry.  Eyes:     Extraocular Movements: Extraocular movements intact.     Conjunctiva/sclera: Conjunctivae normal.     Pupils: Pupils are equal, round, and reactive to light.  Cardiovascular:     Rate and Rhythm: Normal rate and regular rhythm.     Heart sounds: No murmur heard. Pulmonary:     Effort: Pulmonary effort is normal. No respiratory distress.     Breath sounds: Wheezing and rhonchi present.  Abdominal:     Palpations: Abdomen is soft.     Tenderness: There is no abdominal tenderness.  Musculoskeletal:        General: Swelling present.     Cervical back: Neck supple.  Skin:    General: Skin is warm and dry.     Capillary Refill: Capillary refill takes 2 to 3 seconds.  Neurological:     General: No focal deficit present.     Mental Status: He is alert and oriented to person, place, and time.     Cranial Nerves: Cranial nerve deficit present.     Comments: Patient is hard of hearing    ED Results / Procedures / Treatments   Labs (all labs ordered are listed, but only abnormal results are displayed) Labs Reviewed  COMPREHENSIVE METABOLIC PANEL - Abnormal; Notable for the following components:      Result Value   Chloride 92 (*)     CO2 37 (*)    Glucose, Bld 116 (*)    BUN 29 (*)    Creatinine, Ser 1.88 (*)    Calcium 8.7 (*)    Total Protein 5.5 (*)    Albumin 3.2 (*)    GFR, Estimated 37 (*)    All other components within normal limits  CBC WITH DIFFERENTIAL/PLATELET - Abnormal; Notable for the following components:   WBC 13.4 (*)    RBC 3.81 (*)    Hemoglobin 11.9 (*)    HCT 38.1 (*)    Neutro Abs 10.4 (*)    Monocytes Absolute 1.1 (*)    Abs Immature Granulocytes 0.14 (*)    All other components within normal limits  BLOOD GAS, VENOUS - Abnormal; Notable for the following components:   pCO2, Ven 73.5 (*)    Bicarbonate 33.1 (*)    Acid-Base Excess 12.1 (*)    All other components within normal limits  CULTURE, BLOOD (ROUTINE X 2)  CULTURE, BLOOD (ROUTINE X 2)  LACTIC ACID, PLASMA  BRAIN NATRIURETIC PEPTIDE  URINALYSIS, ROUTINE W REFLEX MICROSCOPIC    EKG EKG Interpretation  Date/Time:  Monday June 21 2021 12:03:03 EDT Ventricular Rate:  65 PR Interval:  143 QRS Duration: 113 QT Interval:  481 QTC Calculation: 501 R Axis:   69 Text Interpretation: Sinus or ectopic atrial rhythm Anteroseptal infarct, old Borderline repolarization abnormality Prolonged QT interval Baseline wander Confirmed by Fredia Sorrow [phone]) on 06/21/2021 12:13:01 PM  Radiology DG Chest Port 1 View  Result Date: 06/21/2021 CLINICAL DATA:  Shortness of breath. EXAM: PORTABLE CHEST 1 VIEW COMPARISON:  06/05/2022 FINDINGS: Previous median sternotomy CABG procedure normal heart size. Aortic atherosclerosis. Heart size and mediastinal contours appear normal. No pleural effusion or edema. Coarsened interstitial markings identified. No superimposed airspace consolidation. IMPRESSION: 1. No acute cardiopulmonary abnormalities. 2. Chronic interstitial coarsening. Electronically  Signed   By: Kerby Moors M.D.   On: 06/21/2021 12:56    Procedures Procedures   Medications Ordered in ED Medications  0.9 %  sodium chloride  infusion (has no administration in time range)  lactated ringers infusion (has no administration in time range)  lactated ringers bolus 1,000 mL (has no administration in time range)    And  lactated ringers bolus 1,000 mL (has no administration in time range)    And  lactated ringers bolus 250 mL (has no administration in time range)  ceFEPIme (MAXIPIME) 2 g in sodium chloride 0.9 % 100 mL IVPB (has no administration in time range)  metroNIDAZOLE (FLAGYL) IVPB 500 mg (has no administration in time range)  vancomycin (VANCOREADY) IVPB 1500 mg/300 mL (has no administration in time range)    Followed by  vancomycin (VANCOREADY) IVPB 750 mg/150 mL (has no administration in time range)  ceFEPIme (MAXIPIME) 2 g in sodium chloride 0.9 % 100 mL IVPB (has no administration in time range)  sodium chloride 0.9 % bolus 1,000 mL (1,000 mLs Intravenous New Bag/Given 06/21/21 1233)    ED Course  I have reviewed the triage vital signs and the nursing notes.  Pertinent labs & imaging results that were available during my care of the patient were reviewed by me and considered in my medical decision making (see chart for details).    MDM Rules/Calculators/A&P                           CRITICAL CARE Performed by: Fredia Sorrow Total critical care time: 60 minutes Critical care time was exclusive of separately billable procedures and treating other patients. Critical care was necessary to treat or prevent imminent or life-threatening deterioration. Critical care was time spent personally by me on the following activities: development of treatment plan with patient and/or surrogate as well as nursing, discussions with consultants, evaluation of patient's response to treatment, examination of patient, obtaining history from patient or surrogate, ordering and performing treatments and interventions, ordering and review of laboratory studies, ordering and review of radiographic studies, pulse oximetry and  re-evaluation of patient's condition.   Patient hypotensive.  But lactic acid normal.  Patient with some wheezing.  Venous blood gas shows an elevated PCO2.  pH baseline.  Patient without a significant leukocytosis less than 14.  Usually is elevated some.  Patient given 500 cc bolus blood pressures came up into the low 90s.  Will receive another 500 cc bolus.  Clinically initially felt as if this was probably not sepsis.  But had did not have a good explanation for his low blood pressures other than that he was in acute kidney injury injury could be prerenal.  And may be therefore fluid dehydrated.  Chest x-ray without evidence of pneumonia.  However due to the persistent lower blood pressures when had decided to give the 30 cc/kg bolus and broad-spectrum antibiotics just to cover.  Pulmonary embolus is a possibility but patient had a VQ scan beginning of the month that was negative for PE did have COVID a few weeks ago.  So not completely ruled out.  But due to his acute kidney injury not a candidate for CT scan angio.  Will discussed with the hospitalist.  We have 2 good peripheral IVs.  Other possibility patient is on Inderal.  And with the renal change could be a little bit toxic from that as well.  Discussed with the hospitalist for admission.  Due to the elevated CO2 patient was started on BiPAP.  Is comfortable on that.   Final Clinical Impression(s) / ED Diagnoses Final diagnoses:  COPD exacerbation ([place])  Acute on chronic respiratory failure with hypoxia and hypercapnia (HCC)  AKI (acute kidney injury) ([place])  Sepsis, due to unspecified organism, unspecified whether acute organ dysfunction present [facility])    Rx / DC Orders ED Discharge Orders     None        Fredia Sorrow, MD 06/21/21 1443
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
## 2021-06-21 NOTE — Progress Notes (Signed)
Went to put patient on Bipap.  RN still had some medications to give.  Asked RN to call 30 minutes after meds were given.

## 2021-06-21 NOTE — ED Triage Notes (Signed)
Patient presents to the ED via [facility] for shortness of breath.  Patient states that he is normally short of breath, but worse this morning. Patient had 2 albuterol treatments at home.  EMS administered 1 duoneb and '125mg'$  of solu-medrol.

## 2021-06-21 NOTE — H&P (Addendum)
History and Physical   Patient: Daniel Barker                            PCP: [name], MD                    DOB: [birth year]            DOA: 06/21/2021 [id number]             DOS: 06/21/2021, 3:12 PM  Patient coming from:   HOME  I have personally reviewed patient's medical records, in electronic medical records, including:  [place], and care everywhere.    Chief Complaint:   Chief Complaint  Patient presents with   Shortness of Breath    History of present illness:    Daniel Barker is a 75 y.o. male with medical history significant of grade 1 diastolic congestive heart failure, COPD, chronic respiratory failure dependent on 2 L of oxygen via nasal cannula continuously, chronic atrial fibrillation not chronically anticoagulated with exception of Plavix, stage IIIa CKD, baseline creatinine 1.3-1.5, CAD, status post CABG, BPH, HLD, HTN, multiple hospitalization for COPD exacerbation, SARS-CoV-2 positive on 6/21 - 05/23/21 Presented today once again for shortness of breath, was found hypotensive,   Patient Denies having: Fever, Chills, Cough, Chest Pain, Abd pain, N/V/D, headache, dizziness, lightheadedness,  Dysuria, Joint pain, rash, open wounds  ED Course:   Vitals: Blood pressure 101/61, pulse (!) 59, temperature (!) 97.5 F (36.4 C), temperature source Oral, resp. rate (!) 22, SpO2 100 %.  Blood pressure on arrival 81/53 Abnormal labs: WBC 13.4, hemoglobin 11.9, chloride 92, CO2 37, BUN 29, creatinine 1.88, GFR 37 Lactic acid 0.8, BUN 98, Chest x-ray chronic interstitial disease, no acute cardiopulmonary disease    Review of Systems: As per HPI, otherwise 10 point review of systems were negative.   ----------------------------------------------------------------------------------------------------------------------  Allergies  Allergen Reactions   Lipitor [Atorvastatin]     Home MEDs:  Prior to Admission medications   Medication Sig Start Date End  Date Taking? Authorizing Provider  acetaminophen (TYLENOL) 500 MG tablet Take 1,000 mg by mouth every 6 (six) hours as needed.    [provider]  albuterol (PROVENTIL) (2.5 MG/3ML) 0.083% nebulizer solution INHALE THE CONTENTS OF 1 VIAL VIA NEBULIZER EVERY 6 HOURS AS NEEDED FOR WHEEZING OR SHORTNESS OF BREATH 06/09/21   Tanda Rockers, MD  albuterol (VENTOLIN HFA) 108 (90 Base) MCG/ACT inhaler USE 2 PUFFS BY MOUTH EVERY 4 HOURS AS NEEDED 05/04/21   Tanda Rockers, MD  ascorbic acid (VITAMIN C) 500 MG tablet Take 1 tablet (500 mg total) by mouth daily. 05/24/21   Barton Dubois, MD  budesonide-formoterol [facility]) 160-4.5 MCG/ACT inhaler Take 2 puffs first thing in am and then another 2 puffs about 12 hours later. 10/27/20   Tanda Rockers, MD  clopidogrel (PLAVIX) 75 MG tablet Take 75 mg by mouth daily.    [provider]  fenofibrate micronized (LOFIBRA) 134 MG capsule Take 134 mg by mouth daily before breakfast.    [provider]  guaiFENesin-dextromethorphan (ROBITUSSIN DM) 100-10 MG/5ML syrup Take 5 mLs by mouth every 4 (four) hours as needed for cough. 05/23/21   Barton Dubois, MD  montelukast (SINGULAIR) 10 MG tablet Take 10 mg by mouth at bedtime.    [provider]  pantoprazole (PROTONIX) 40 MG tablet Take 1 tablet (40 mg total) by mouth 2 (two) times daily. 05/23/21  Barton Dubois, MD  propafenone (RYTHMOL) 225 MG tablet Take 1 tablet (225 mg total) by mouth every 8 (eight) hours. 02/03/21   Josue Hector, MD  propranolol (INDERAL) 40 MG tablet Take 40 mg by mouth 2 (two) times daily. 05/05/21   [provider]  tamsulosin (FLOMAX) 0.4 MG CAPS capsule Take 1 capsule (0.4 mg total) by mouth daily. 07/08/20   McKenzie, Candee Furbish, MD  torsemide (DEMADEX) 100 MG tablet Take 0.5 tablets (50 mg total) by mouth daily. 04/08/21   Roxan Hockey, MD  zinc sulfate 220 (50 Zn) MG capsule Take 1 capsule (220 mg total) by mouth daily. 05/24/21   Barton Dubois, MD    PRN MEDs: sodium chloride, acetaminophen **OR** acetaminophen, bisacodyl, hydrALAZINE, HYDROmorphone (DILAUDID) injection, ipratropium, levalbuterol, ondansetron **OR** ondansetron (ZOFRAN) IV, oxyCODONE, senna-docusate, sodium chloride flush, traZODone  Past Medical History:  Diagnosis Date   Asthma    Atrial fibrillation (HCC)    BPH (benign prostatic hyperplasia)    Cigarette smoker    CKD (chronic kidney disease)    COPD (chronic obstructive pulmonary disease) (HCC)    Coronary artery disease    Cough    High cholesterol    Hx of CABG    Hypercholesteremia    Hypertension    Localized edema    Stented coronary artery     Past Surgical History:  Procedure Laterality Date   CARDIAC SURGERY     CORONARY ARTERY BYPASS GRAFT       reports that he quit smoking about 9 months ago. His smoking use included cigarettes. He has a 60.00 pack-year smoking history. He has never used smokeless tobacco. He reports that he does not drink alcohol and does not use drugs.   Family History  Problem Relation Age of Onset   Hypertension Mother    Clotting disorder Mother    COPD Father     Physical Exam:   Vitals:   06/21/21 1400 06/21/21 1415 06/21/21 1430 06/21/21 1500  BP: (!) 95/59 91/60 (!) 94/59 101/61  Pulse:  60 60 (!) 59  Resp: (!) 24 (!) 24 (!) 21 (!) 22  Temp:      TempSrc:      SpO2:  100% 100% 100%   Constitutional: NAD, calm, comfortable Eyes: PERRL, lids and conjunctivae normal ENMT: Mucous membranes are moist. Posterior pharynx clear of any exudate or lesions.Normal dentition.  Neck: normal, supple, no masses, no thyromegaly Respiratory: clear to auscultation bilaterally, no wheezing, no crackles. Normal respiratory effort. No accessory muscle use.  Cardiovascular: Regular rate and rhythm, no murmurs / rubs / gallops. No extremity edema. 2+ pedal pulses. No carotid bruits.  Abdomen: no tenderness, no masses palpated. No hepatosplenomegaly. Bowel  sounds positive.  Musculoskeletal: no clubbing / cyanosis. No joint deformity upper and lower extremities. Good ROM, no contractures. Normal muscle tone.  Neurologic: CN II-XII grossly intact. Sensation intact, DTR normal. Strength 5/5 in all 4.  Psychiatric: Normal judgment and insight. Alert and oriented x 3. Normal mood.  Skin: no rashes, lesions, ulcers. No induration Decubitus/ulcers:  Wounds: per nursing documentation         Labs on admission:    I have personally reviewed following labs and imaging studies  CBC: Recent Labs  Lab 06/21/21 1218  WBC 13.4*  NEUTROABS 10.4*  HGB 11.9*  HCT 38.1*  MCV 100.0  PLT 99991111   Basic Metabolic Panel: Recent Labs  Lab 06/21/21 1218  NA 137  K 3.9  CL 92*  CO2 37*  GLUCOSE 116*  BUN 29*  CREATININE 1.88*  CALCIUM 8.7*   GFR: Estimated Creatinine Clearance: 32.2 mL/min (A) (by C-G formula based on SCr of 1.88 mg/dL (H)). Liver Function Tests: Recent Labs  Lab 06/21/21 1218  AST 15  ALT 16  ALKPHOS 58  BILITOT 1.1  PROT 5.5*  ALBUMIN 3.2*   No results for input(s): LIPASE, AMYLASE in the last 168 hours. No results for input(s): AMMONIA in the last 168 hours. Coagulation Profile: No results for input(s): INR, PROTIME in the last 168 hours. Cardiac Enzymes: No results for input(s): CKTOTAL, CKMB, CKMBINDEX, TROPONINI in the last 168 hours. BNP (last 3 results) No results for input(s): PROBNP in the last 8760 hours. HbA1C: No results for input(s): HGBA1C in the last 72 hours. CBG: No results for input(s): GLUCAP in the last 168 hours. Lipid Profile: No results for input(s): CHOL, HDL, LDLCALC, TRIG, CHOLHDL, LDLDIRECT in the last 72 hours. Thyroid Function Tests: No results for input(s): TSH, T4TOTAL, FREET4, T3FREE, THYROIDAB in the last 72 hours. Anemia Panel: No results for input(s): VITAMINB12, FOLATE, FERRITIN, TIBC, IRON, RETICCTPCT in the last 72 hours. Urine analysis:    Component Value Date/Time    COLORURINE YELLOW 03/28/2020 1601   APPEARANCEUR HAZY (A) 03/28/2020 1601   LABSPEC 1.009 03/28/2020 1601   PHURINE 7.0 03/28/2020 1601   GLUCOSEU NEGATIVE 03/28/2020 1601   HGBUR MODERATE (A) 03/28/2020 1601   BILIRUBINUR NEGATIVE 03/28/2020 1601   KETONESUR NEGATIVE 03/28/2020 1601   PROTEINUR NEGATIVE 03/28/2020 1601   NITRITE NEGATIVE 03/28/2020 1601   LEUKOCYTESUR MODERATE (A) 03/28/2020 1601     Radiologic Exams on Admission:   DG Chest Port 1 View  Result Date: 06/21/2021 CLINICAL DATA:  Shortness of breath. EXAM: PORTABLE CHEST 1 VIEW COMPARISON:  06/05/2022 FINDINGS: Previous median sternotomy CABG procedure normal heart size. Aortic atherosclerosis. Heart size and mediastinal contours appear normal. No pleural effusion or edema. Coarsened interstitial markings identified. No superimposed airspace consolidation. IMPRESSION: 1. No acute cardiopulmonary abnormalities. 2. Chronic interstitial coarsening. Electronically Signed   By: Kerby Moors M.D.   On: 06/21/2021 12:56    EKG:   Independently reviewed.  Orders placed or performed during the hospital encounter of 06/21/21   EKG 12-Lead   EKG 12-Lead   EKG 12-Lead   ---------------------------------------------------------------------------------------------------------------------------------------    Assessment / Plan:   Principal Problem:   Hypoxia Active Problems:   Acute on chronic respiratory failure with hypoxia (HCC)   COPD GOLD IV / group D    Anemia   CAD (coronary atherosclerotic disease)   Diabetes mellitus ([place])   Hypertension   COVID-19 virus infection   Atrial fibrillation, chronic (HCC)   Chronic kidney disease, stage 3a (HCC)  Principal Problem:  Acute on chronic respiratory failure with hypoxia (HCC)/in the setting of COPD GOLD IV / group D  -Patient will be admitted to stepdown unit -Currently meeting sepsis criteria due to leukocytosis, hypotension, a on CKD, -But the above can be  explained by chronic disease (COPD, dehydration, CKD,) -Chest x-ray negative acute infiltrate, chronic history of renal disease -Baseline O2 demand 2 L -In ED was placed on BiPAP 09/20/1939, currently satting 100%  Ruling out sepsis -On arrival temp 97.5, pulse 59, RR 26, BP 81/53, WBC 13.4, BUN 29, creatinine 1.88 -Lactic acid 0.8 -Currently meeting sepsis criteria due to leukocytosis, hypotension, a on CKD, -But the above can be explained by chronic disease (COPD, dehydration, CKD,) -Sepsis protocol initiated... aggressive IV fluid, IV antibiotics (  cefepime, vancomycin -Blood cultures been obtained -Pending UA -Chest x-ray chronic interstitial lung disease negative for any acute infiltrate -We will de-escalate antibiotics soon if sepsis is ruled out   Hypotension -Ruling sepsis, likely multifactorial including medication, aggressive diuretics, BP meds -According to sepsis protocol we will continue IV fluid, being mindful of volume overload due to history of diastolic CHF -We will holding home medications of Inderal, Demadex, -We will monitor closely, we may have to withhold IV fluids, consider using pressors if remains hypotensive Acutely with this admission 2040   Anemia of chronic disease -H&H stable at baseline monitor    CAD (coronary atherosclerotic disease)/with history of CABG-continue home medication of Plavix,   Hyperlipidemia -not on any statins, continue fenofibrate   Diabetes mellitus ([place]) -no new medication, checking hemoglobin A1c, SSI coverage    Hypertension -hypotensive therefore holding BP medication including propanolol,     COVID-19 virus infection -recent known infection-SARS-CoV-2 positive on 05/18/21 According to latest recommendation no need for isolation,     Grade 1 diastolic congestive heart failure, -Withholding diuretics for now Demadex due to hypotension, dehydration, -Records reviewed, patient's last echo 06/06/2021 was reviewed ejection  fraction 60-65%, negative for any LVH- left heart function preserved -Monitoring daily weights I's and O's -Patient is not on any ACE inhibitor likely due to CKD    COPD-with chronic respiratory failure dependent on 2 L of oxygen via nasal cannula continuously,   chronic atrial fibrillation -records from his cardiologist Dr. Karlene Lineman reviewed, not chronically anticoagulated with exception of Plavix (it appears due to patient's choice not chronically anticoagulated with other agents )  Stage IIIa CKD,  - baseline creatinine 1.3-1.5, -BUN 29, creatinine 1.88 with GFR 37, baseline 51 -Continuing IV fluid hydration, -Avoiding nephrotoxins  BPH, -Continue home medication of Flomax  Recent diagnosis of SARS-CoV-2 in infection, multiple hospitalization for COPD exacerbation  -Continue BiPAP, with a goal to wean off to O2 by nasal cannula, Goal to maintain O2 sat 88-92% -Continue DuoNeb bronchodilator, mucolytic's,  -Antibiotics per sepsis protocol, with holding steroids use at this time   G  Cultures:  -Blood cultures x2 Antimicrobial: -Broad-spectrum antibiotics cefepime vancomycin  Consults called:  None -------------------------------------------------------------------------------------------------------------------------------------------- DVT prophylaxis: SCD/Compression stockings and Heparin SQ Code Status:   Code Status: Full Code   Admission status: Patient will be admitted as Inpatient, with a greater than 2 midnight length of stay. Level of care: Stepdown   Family Communication:  none at bedside  (The above findings and plan of care has been discussed with patient in detail, the patient expressed understanding and agreement of above plan)  ---------------------------------------------------------------------------------------------------------------------------------------------  Disposition Plan: >3 days Status is: Inpatient  Remains inpatient appropriate  because:Hemodynamically unstable, Persistent severe electrolyte disturbances, and Inpatient level of care appropriate due to severity of illness  Dispo: The patient is from: Home              Anticipated d/c is to: Home with HH in 2-3 days               Patient currently is not medically stable to d/c.  As remain hypotensive, hypoxic... Needing BiPAP, IV fluids, ruling out sepsis   Difficult to place patient No   ----------------------------------------------------------------------------------------------------------------------------------------------  Time spent: > than 66  Min.   SIGNED: Deatra James, MD, FHM. [facility],  Pager (Please use [email or web address] to page to text)  If 7PM-7AM, please contact night-coverage [email or web address],  06/21/2021, 3:12 PM

## 2021-06-22 ENCOUNTER — Inpatient Hospital Stay (HOSPITAL_COMMUNITY): Payer: Medicare Other

## 2021-06-22 DIAGNOSIS — R0902 Hypoxemia: Secondary | ICD-10-CM | POA: Diagnosis not present

## 2021-06-22 LAB — BASIC METABOLIC PANEL
Anion gap: 8 (ref 5–15)
BUN: 34 mg/dL — ABNORMAL HIGH (ref 8–23)
CO2: 34 mmol/L — ABNORMAL HIGH (ref 22–32)
Calcium: 8.6 mg/dL — ABNORMAL LOW (ref 8.9–10.3)
Chloride: 100 mmol/L (ref 98–111)
Creatinine, Ser: 1.58 mg/dL — ABNORMAL HIGH (ref 0.61–1.24)
GFR, Estimated: 46 mL/min — ABNORMAL LOW (ref >60)
Glucose, Bld: 154 mg/dL — ABNORMAL HIGH (ref 70–99)
Potassium: 4.5 mmol/L (ref 3.5–5.1)
Sodium: 142 mmol/L (ref 135–145)

## 2021-06-22 LAB — CBC
HCT: 36.8 % — ABNORMAL LOW (ref 39.0–52.0)
Hemoglobin: 11.2 g/dL — ABNORMAL LOW (ref 13.0–17.0)
MCH: 31 pg (ref 26.0–34.0)
MCHC: 30.4 g/dL (ref 30.0–36.0)
MCV: 101.9 fL — ABNORMAL HIGH (ref 80.0–100.0)
Platelets: 202 10*3/uL (ref 150–400)
RBC: 3.61 MIL/uL — ABNORMAL LOW (ref 4.22–5.81)
RDW: 13.3 % (ref 11.5–15.5)
WBC: 7.4 10*3/uL (ref 4.0–10.5)
nRBC: 0 % (ref 0.0–0.2)

## 2021-06-22 LAB — BLOOD GAS, ARTERIAL
Acid-Base Excess: 6.5 mmol/L — ABNORMAL HIGH (ref 0.0–2.0)
Bicarbonate: 29.3 mmol/L — ABNORMAL HIGH (ref 20.0–28.0)
FIO2: 28
O2 Saturation: 92.8 %
Patient temperature: 36.4
pCO2 arterial: 59 mmHg — ABNORMAL HIGH (ref 32.0–48.0)
pH, Arterial: 7.351 (ref 7.350–7.450)
pO2, Arterial: 67.9 mmHg — ABNORMAL LOW (ref 83.0–108.0)

## 2021-06-22 LAB — PROTIME-INR
INR: 1 (ref 0.8–1.2)
Prothrombin Time: 12.8 seconds (ref 11.4–15.2)

## 2021-06-22 MED ORDER — ALBUTEROL SULFATE (2.5 MG/3ML) 0.083% IN NEBU
2.5000 mg | INHALATION_SOLUTION | RESPIRATORY_TRACT | Status: DC | PRN
  Administered 2021-06-22 (×2): 2.5 mg via RESPIRATORY_TRACT
  Filled 2021-06-22 (×2): qty 3

## 2021-06-22 MED ORDER — CHLORHEXIDINE GLUCONATE CLOTH 2 % EX PADS
6.0000 | MEDICATED_PAD | Freq: Every day | CUTANEOUS | Status: DC
  Administered 2021-06-22 – 2021-06-23 (×2): 6 via TOPICAL

## 2021-06-22 MED ORDER — METHYLPREDNISOLONE SODIUM SUCC 40 MG IJ SOLR
40.0000 mg | Freq: Two times a day (BID) | INTRAMUSCULAR | Status: DC
  Administered 2021-06-22 – 2021-06-23 (×3): 40 mg via INTRAVENOUS
  Filled 2021-06-22 (×3): qty 1

## 2021-06-22 NOTE — Evaluation (Addendum)
Physical Therapy Evaluation Patient Details Name: Daniel Barker MRN: [id number] DOB: [birth year] Today's Date: 06/22/2021   History of Present Illness  Daniel Barker is a 75 y.o. male with medical history significant of grade 1 diastolic congestive heart failure, COPD, chronic respiratory failure dependent on 2 L of oxygen via nasal cannula continuously, chronic atrial fibrillation not chronically anticoagulated with exception of Plavix, stage IIIa CKD, baseline creatinine 1.3-1.5, CAD, status post CABG, BPH, HLD, HTN, multiple hospitalization for COPD exacerbation,  SARS-CoV-2 positive on 6/21 - 05/23/21  Presented today once again for shortness of breath, was found hypotensive   Clinical Impression  Patient functioning near baseline for functional mobility and gait, other than limited endurance mostly due to SOB.  Patient demonstrates good return for transferring to chair and commode, ambulation in room/hallway without use of an AD without loss of balance, on 2 LPM with SpO2 at 93% and tolerated sitting up in chair after therapy.  Plan:  Patient discharged from physical therapy to care of nursing for ambulation daily as tolerated for length of stay.      Follow Up Recommendations No PT follow up    Equipment Recommendations  None recommended by PT    Recommendations for Other Services       Precautions / Restrictions Precautions Precautions: Fall Restrictions Weight Bearing Restrictions: No      Mobility  Bed Mobility Overal bed mobility: Modified Independent                  Transfers Overall transfer level: Modified independent                  Ambulation/Gait Ambulation/Gait assistance: Modified independent (Device/Increase time) Gait Distance (Feet): 75 Feet Assistive device: None Gait Pattern/deviations: WFL(Within Functional Limits) Gait velocity: decreased   General Gait Details: slightly labored cadence without loss of balance, limited mostly due  to c/o SOB with SpO2 at 93% while on 2 LPM O2  Stairs            Wheelchair Mobility    Modified Rankin (Stroke Patients Only)       Balance Overall balance assessment: No apparent balance deficits (not formally assessed)                                           Pertinent Vitals/Pain Pain Assessment: No/denies pain    Home Living Family/patient expects to be discharged to:: Private residence   Available Help at Discharge: Family;Available PRN/intermittently Type of Home: Mobile home Home Access: Stairs to enter Entrance Stairs-Rails: Right;Left;Can reach both Entrance Stairs-Number of Steps: 4-5 Home Layout: One level Home Equipment: Cane - single point;Walker - 2 wheels      Prior Function Level of Independence: Independent         Comments: household and short distanced community ambulator, uses RW PRN, drives     Hand Dominance   Dominant Hand: Left    Extremity/Trunk Assessment   Upper Extremity Assessment Upper Extremity Assessment: Overall WFL for tasks assessed    Lower Extremity Assessment Lower Extremity Assessment: Overall WFL for tasks assessed    Cervical / Trunk Assessment Cervical / Trunk Assessment: Kyphotic  Communication   Communication: HOH  Cognition Arousal/Alertness: Awake/alert Behavior During Therapy: WFL for tasks assessed/performed Overall Cognitive Status: Within Functional Limits for tasks assessed  General Comments      Exercises     Assessment/Plan    PT Assessment Patent does not need any further PT services  PT Problem List Decreased mobility;Decreased activity tolerance       PT Treatment Interventions      PT Goals (Current goals can be found in the Care Plan section)  Acute Rehab PT Goals Patient Stated Goal: return home with family to assist PT Goal Formulation: With patient Time For Goal Achievement: 06/22/21 Potential to  Achieve Goals: Good    Frequency     Barriers to discharge        Co-evaluation               AM-PAC PT "6 Clicks" Mobility  Outcome Measure Help needed turning from your back to your side while in a flat bed without using bedrails?: None Help needed moving from lying on your back to sitting on the side of a flat bed without using bedrails?: None Help needed moving to and from a bed to a chair (including a wheelchair)?: None Help needed standing up from a chair using your arms (e.g., wheelchair or bedside chair)?: None Help needed to walk in hospital room?: A Little Help needed climbing 3-5 steps with a railing? : A Little 6 Click Score: 22    End of Session Equipment Utilized During Treatment: Oxygen Activity Tolerance: Patient tolerated treatment well;Patient limited by fatigue Patient left: in chair;with call bell/phone within reach Nurse Communication: Mobility status PT Visit Diagnosis: Unsteadiness on feet (R26.81);Other abnormalities of gait and mobility (R26.89);Muscle weakness (generalized) (M62.81)    Time: [id number] PT Time Calculation (min) (ACUTE ONLY): 26 min   Charges:   PT Evaluation $PT Eval Moderate Complexity: 1 Mod PT Treatments $Therapeutic Activity: 23-37 mins        12:12 PM, 06/22/21 Lonell Grandchild, MPT Physical Therapist with [facility] [place] 336 [phone] office [phone] mobile phone

## 2021-06-22 NOTE — Progress Notes (Signed)
Progress note  Patient: Daniel Barker                            PCP: [name], MD                    DOB: 03/25/1946            DOA: 06/21/2021 [id number]:[id number]             DOS: 06/22/2021, 2:08 PM  Patient coming from:   HOME  I have personally reviewed patient's medical records, in [facility] records, including:  [place], and care everywhere.    Subjective:    The patient was seen and examined this morning, remained stable, has been weaned off BiPAP, satting 98% on 2 L of oxygen by nasal cannula, continues to complain of shortness of breath with minimal exertion. BP has improved  History of present illness:    Gregor Dershem is a 75 y.o. male with medical history significant of grade 1 diastolic congestive heart failure, COPD, chronic respiratory failure dependent on 2 L of oxygen via nasal cannula continuously, chronic atrial fibrillation not chronically anticoagulated with exception of Plavix, stage IIIa CKD, baseline creatinine 1.3-1.5, CAD, status post CABG, BPH, HLD, HTN, multiple hospitalization for COPD exacerbation, SARS-CoV-2 positive on 6/21 - 05/23/21 Presented today once again for shortness of breath, was found hypotensive,   Patient Denies having: Fever, Chills, Cough, Chest Pain, Abd pain, N/V/D, headache, dizziness, lightheadedness,  Dysuria, Joint pain, rash, open wounds  ED Course:   Vitals: Blood pressure 101/61, pulse (!) 59, temperature (!) 97.5 F (36.4 C), temperature source Oral, resp. rate (!) 22, SpO2 100 %.  Blood pressure on arrival 81/53 Abnormal labs: WBC 13.4, hemoglobin 11.9, chloride 92, CO2 37, BUN 29, creatinine 1.88, GFR 37 Lactic acid 0.8, BUN 98, Chest x-ray chronic interstitial disease, no acute cardiopulmonary disease Admitted for acute on chronic respiratory failure, hypotension,   ---------------------------------------------------------------------------------------------------------------------- Assessment / Plan:    Principal Problem:   Hypoxia Active Problems:   Acute on chronic respiratory failure with hypoxia (HCC)   COPD GOLD IV / group D    Anemia   CAD (coronary atherosclerotic disease)   Diabetes mellitus (HCC)   Hypertension   COVID-19 virus infection   Atrial fibrillation, chronic (HCC)   Chronic kidney disease, stage 3a (HCC)  Principal Problem:  Acute on chronic respiratory failure with hypoxia (HCC)/in the setting of COPD GOLD IV / group D  -Much improved respiratory failure, has been weaned off BiPAP, currently on 2 L of oxygen, satting 93% -Currently meeting sepsis criteria due to leukocytosis, hypotension, a on CKD, -But the above can be explained by chronic disease (COPD, dehydration, CKD,) -Chest x-ray negative acute infiltrate, chronic history of renal disease -Baseline O2 demand 2 L -In ED was placed on BiPAP 09/20/1939, >>.  Has been weaned off  Ruling out sepsis -Sepsis was ruled out -On arrival temp 97.5, pulse 59, RR 26, BP 81/53, WBC 13.4, BUN 29, creatinine 1.88 -Lactic acid 0.8 -On admission met sepsis criteria due to leukocytosis, hypotension, a on CKD, -But the above can be explained by chronic disease (COPD, dehydration, CKD,) -Sepsis protocol initiated... aggressive IV fluid, IV antibiotics (cefepime, vancomycin -Blood cultures >>> no growth to date -Pending UA -Chest x-ray chronic interstitial lung disease negative for any acute infiltrate -We have discontinued both IV antibiotics, cefepime and vancomycin   Hypotension -Blood pressure has improved for  gentle IV fluid hydration --discontinuing IV fluids -Likely due to dehydration, hypovolemia -Sepsis was ruled out likely multifactorial including medication, aggressive diuretics, BP meds -According to sepsis protocol we will continue IV fluid, being mindful of volume overload due to history of diastolic CHF -We will holding home medications of Inderal, Demadex, -We will monitor closely, we may have to  withhold IV fluids, consider using pressors if remains hypotensive    Anemia of chronic disease -Monitoring H&H, remained stable    CAD (coronary atherosclerotic disease)/with history of CABG -Stable -continue home medication of Plavix,   Hyperlipidemia -not on any statins, continue fenofibrate, stable   Diabetes mellitus (HCC) -no new medication, checking hemoglobin A1c, SSI coverage    Hypertension -hypotensive -with holding BP meds, anticipating resuming his beta-blockers    COVID-19 virus infection -recent known infection-SARS-CoV-2 positive on 05/18/21 According to latest recommendation no need for isolation,     Grade 1 diastolic congestive heart failure, -Withholding diuretics for now Demadex due to hypotension, dehydration, -Much improved anticipating restarting Demadex in a.m.  -Records reviewed, patient's last echo 06/06/2021 was reviewed ejection fraction 60-65%, negative for any LVH- left heart function preserved -Monitoring daily weights I's and O's -Patient is not on any ACE inhibitor likely due to CKD    COPD-with chronic respiratory failure dependent on 2 L of oxygen via nasal cannula continuously,  Back to baseline 2 L satting 93%  chronic atrial fibrillation -records from his cardiologist Dr. Karlene Lineman reviewed, not chronically anticoagulated with exception of Plavix (it appears due to patient's choice not chronically anticoagulated with other agents )  Stage IIIa CKD,  - baseline creatinine 1.3-1.5, -BUN 29, creatinine 1.88 with GFR 37, baseline 51 >> creatinine 1.58 -Status post FLA fluid hydration, will discontinue -Avoiding nephrotoxins  BPH, -Continue home medication of Flomax  Recent diagnosis of SARS-CoV-2 in infection, multiple hospitalization for COPD exacerbation  -Continue BiPAP, with a goal to wean off to O2 by nasal cannula, Goal to maintain O2 sat 88-92% -Continue DuoNeb bronchodilator, mucolytic's,  -Antibiotics per sepsis protocol, with  holding steroids use at this time    Cultures:  -Blood cultures x2 Antimicrobial: -Broad-spectrum antibiotics cefepime vancomycin..  Discontinued 06/22/2021  Consults called:  None -------------------------------------------------------------------------------------------------------------------------------------------- DVT prophylaxis: SCD/Compression stockings and Heparin SQ Code Status:   Code Status: Full Code   Admission status: Patient will be admitted as Inpatient, with a greater than 2 midnight length of stay. Level of care: Stepdown   Family Communication:  none at bedside  (The above findings and plan of care has been discussed with patient in detail, the patient expressed understanding and agreement of above plan)  ---------------------------------------------------------------------------------------------------------------------------------------------  Disposition Plan: >3 days Status is: Inpatient  Remains inpatient appropriate because:Hemodynamically unstable, Persistent severe electrolyte disturbances, and Inpatient level of care appropriate due to severity of illness  Dispo: The patient is from: Home              Anticipated d/c is to: Home with HH in 1-2 days               Patient currently is not medically stable to d/c.  As remain hypotensive, hypoxic... Needing BiPAP, IV fluids, ruling out sepsis   Difficult to place patient No      Allergies  Allergen Reactions   Lipitor [Atorvastatin]    Scheduled Meds:  ascorbic acid  500 mg Oral Daily   Chlorhexidine Gluconate Cloth  6 each Topical Daily   clopidogrel  75 mg Oral Daily   fluticasone furoate-vilanterol  1 puff Inhalation Daily   guaiFENesin-dextromethorphan  10 mL Oral Q8H   heparin  5,000 Units Subcutaneous Q8H   methylPREDNISolone (SOLU-MEDROL) injection  40 mg Intravenous Q12H   montelukast  10 mg Oral QHS   pantoprazole  40 mg Oral BID   propafenone  225 mg Oral Q8H   propranolol  40 mg  Oral BID   sodium chloride flush  3 mL Intravenous Q12H   sodium chloride flush  3 mL Intravenous Q12H   tamsulosin  0.4 mg Oral Daily   [START ON 06/23/2021] torsemide  50 mg Oral Daily   zinc sulfate  220 mg Oral Daily   Continuous Infusions:  sodium chloride     PRN Meds:.sodium chloride, acetaminophen **OR** acetaminophen, albuterol, bisacodyl, hydrALAZINE, HYDROmorphone (DILAUDID) injection, ondansetron **OR** ondansetron (ZOFRAN) IV, oxyCODONE, senna-docusate, sodium chloride flush, traZODone PRN MEDs: sodium chloride, acetaminophen **OR** acetaminophen, albuterol, bisacodyl, hydrALAZINE, HYDROmorphone (DILAUDID) injection, ondansetron **OR** ondansetron (ZOFRAN) IV, oxyCODONE, senna-docusate, sodium chloride flush, traZODone  Past Medical History:  Diagnosis Date   Asthma    Atrial fibrillation (HCC)    BPH (benign prostatic hyperplasia)    Cigarette smoker    CKD (chronic kidney disease)    COPD (chronic obstructive pulmonary disease) (HCC)    Coronary artery disease    Cough    High cholesterol    Hx of CABG    Hypercholesteremia    Hypertension    Localized edema    Stented coronary artery     Past Surgical History:  Procedure Laterality Date   CARDIAC SURGERY     CORONARY ARTERY BYPASS GRAFT       reports that he quit smoking about 9 months ago. His smoking use included cigarettes. He has a 60.00 pack-year smoking history. He has never used smokeless tobacco. He reports that he does not drink alcohol and does not use drugs.   Family History  Problem Relation Age of Onset   Hypertension Mother    Clotting disorder Mother    COPD Father     Physical Exam:   Vitals:   06/22/21 1100 06/22/21 1113 06/22/21 1202 06/22/21 1300  BP: 96/63  (!) 119/58 (!) 117/54  Pulse: 69 69 75 66  Resp: (!) 22 (!) $Remo'21 20 19  'McvLc$ Temp:  (!) 97.5 F (36.4 C)    TempSrc:  Oral    SpO2: 90% 90% 95% 93%  Weight:      Height:         Physical Exam:   General:  Hard of hearing,  otherwise alert, oriented, cooperative, no distress;   HEENT:  Normocephalic, PERRL, otherwise with in Normal limits   Neuro:  CNII-XII intact. , normal motor and sensation, reflexes intact   Lungs:   Clear to auscultation BL, Respirations unlabored, no wheezes / crackles  Cardio:    S1/S2, RRR, No murmure, No Rubs or Gallops   Abdomen:   Soft, non-tender, bowel sounds active all four quadrants,  no guarding or peritoneal signs.  Muscular skeletal:  Global generalized weaknesses, Limited exam - in bed, able to move all 4 extremities, Normal strength,  2+ pulses,  symmetric,   Skin:  Dry, warm to touch, negative for any Rashes,  Wounds: Please see nursing documentation       Labs on admission:    I have personally reviewed following labs and imaging studies  CBC: Recent Labs  Lab 06/21/21 1218 06/22/21 0423  WBC 13.4* 7.4  NEUTROABS 10.4*  --   HGB 11.9*  11.2*  HCT 38.1* 36.8*  MCV 100.0 101.9*  PLT 245 297   Basic Metabolic Panel: Recent Labs  Lab 06/21/21 1218 06/22/21 0423  NA 137 142  K 3.9 4.5  CL 92* 100  CO2 37* 34*  GLUCOSE 116* 154*  BUN 29* 34*  CREATININE 1.88* 1.58*  CALCIUM 8.7* 8.6*   GFR: Estimated Creatinine Clearance: 37 mL/min (A) (by C-G formula based on SCr of 1.58 mg/dL (H)). Liver Function Tests: Recent Labs  Lab 06/21/21 1218  AST 15  ALT 16  ALKPHOS 58  BILITOT 1.1  PROT 5.5*  ALBUMIN 3.2*   No results for input(s): LIPASE, AMYLASE in the last 168 hours. No results for input(s): AMMONIA in the last 168 hours. Coagulation Profile: Recent Labs  Lab 06/21/21 1218 06/22/21 0423  INR 1.0 1.0   Urine analysis:    Component Value Date/Time   COLORURINE YELLOW 06/21/2021 1740   APPEARANCEUR CLEAR 06/21/2021 1740   LABSPEC 1.018 06/21/2021 1740   PHURINE 5.0 06/21/2021 1740   GLUCOSEU NEGATIVE 06/21/2021 1740   HGBUR NEGATIVE 06/21/2021 1740   BILIRUBINUR NEGATIVE 06/21/2021 1740   KETONESUR 5 (A) 06/21/2021 1740   PROTEINUR  NEGATIVE 06/21/2021 1740   NITRITE NEGATIVE 06/21/2021 1740   LEUKOCYTESUR NEGATIVE 06/21/2021 1740     Radiologic Exams on Admission:   DG Chest Port 1 View  Result Date: 06/22/2021 CLINICAL DATA:  Shortness of breath and asthma, COVID positive 06/05/2021 EXAM: PORTABLE CHEST 1 VIEW COMPARISON:  06/21/2021 FINDINGS: Stable cardiomegaly and previous median sternotomy. Similar hyperinflation and left basilar scarring. No superimposed acute pneumonia, significant collapse or consolidation. Negative for edema, large effusion or pneumothorax. Trachea midline. Aorta atherosclerotic. IMPRESSION: Stable chest exam.  No interval change or acute finding. Aortic Atherosclerosis (ICD10-I70.0). Electronically Signed   By: Jerilynn Mages.  Shick M.D.   On: 06/22/2021 10:09   DG Chest Port 1 View  Result Date: 06/21/2021 CLINICAL DATA:  Shortness of breath. EXAM: PORTABLE CHEST 1 VIEW COMPARISON:  06/05/2022 FINDINGS: Previous median sternotomy CABG procedure normal heart size. Aortic atherosclerosis. Heart size and mediastinal contours appear normal. No pleural effusion or edema. Coarsened interstitial markings identified. No superimposed airspace consolidation. IMPRESSION: 1. No acute cardiopulmonary abnormalities. 2. Chronic interstitial coarsening. Electronically Signed   By: Kerby Moors M.D.   On: 06/21/2021 12:56    EKG:   Independently reviewed.  Orders placed or performed during the hospital encounter of 06/21/21   EKG 12-Lead   EKG 12-Lead   EKG 12-Lead   ---------------------------------------------------------------------------------------------------------------------------------------  ----------------------------------------------------------------------------------------------------------------------------------------------  Time spent: > than 66  Min.   SIGNED: Deatra James, MD, FHM. [facility],  Pager (Please use [email or web address] to page to text)  If 7PM-7AM, please contact  night-coverage [email or web address],  06/22/2021, 2:08 PM

## 2021-06-22 NOTE — Progress Notes (Addendum)
Patient is sitting up on side of bed at this time.  Will delay CPT till 0400.  Will place patient on Bipap if warranted.  Patient sat is 94% at this time and patient does not appear to be in any distress.

## 2021-06-22 NOTE — TOC Initial Note (Signed)
Transition of Care [facility]) - Initial/Assessment Note    Patient Details  Name: Daniel Barker MRN: [id number] Date of Birth: March 09, 1946  [facility] of [facility] CM/SW Contact:    Salome Arnt, [place] Phone Number: 06/22/2021, 9:42 AM  Clinical Narrative:  Pt admitted with acute on chronic respiratory failure with hypoxia. LCSW completed assessment due to high risk readmission score. Pt reports he lives with his wife and step-daughter. They both care for pt's wife who has dementia. At baseline, pt indicates he is independent with ADLs. He plans to return home when medically stable. TOC received consult for PCP needs/home health/DME. Pt reports he sees Dr. [name]. PT evaluation pending- LCSW will follow up with any recommendations.                Expected Discharge Plan: Home/Self Care Barriers to Discharge: Continued Medical Work up   Patient Goals and CMS Choice Patient states their goals for this hospitalization and ongoing recovery are:: return home      Expected Discharge Plan and Services Expected Discharge Plan: Home/Self Care In-house Referral: Clinical Social Work     Living arrangements for the past 2 months: Single Family Home                 DME Arranged: N/A DME Agency: NA                  Prior Living Arrangements/Services Living arrangements for the past 2 months: Single Family Home Lives with:: Spouse, Adult Children Patient language and need for interpreter reviewed:: Yes Do you feel safe going back to the place where you live?: Yes      Need for Family Participation in Patient Care: No (Comment) Care giver support system in place?: Yes (comment)   Criminal Activity/Legal Involvement Pertinent to Current Situation/Hospitalization: No - Comment as needed  Activities of Daily Living Home Assistive Devices/Equipment: CBG Meter, Oxygen ADL Screening (condition at time of admission) Patient's cognitive ability adequate to safely complete daily  activities?: Yes Is the patient deaf or have difficulty hearing?: Yes Does the patient have difficulty seeing, even when wearing glasses/contacts?: No Does the patient have difficulty concentrating, remembering, or making decisions?: No Patient able to express need for assistance with ADLs?: Yes Does the patient have difficulty dressing or bathing?: No Independently performs ADLs?: Yes (appropriate for developmental age) Does the patient have difficulty walking or climbing stairs?: No Weakness of Legs: Both Weakness of Arms/Hands: None  Permission Sought/Granted                  Emotional Assessment   Attitude/Demeanor/Rapport: Engaged Affect (typically observed): Pleasant Orientation: : Oriented to Self, Oriented to Place, Oriented to  Time, Oriented to Situation Alcohol / Substance Use: Not Applicable Psych Involvement: No (comment)  Admission diagnosis:  Hypoxia [R09.02] COPD exacerbation (HCC) [J44.1] AKI (acute kidney injury) ([place]) [N17.9] Acute on chronic respiratory failure with hypoxia and hypercapnia (HCC) [J96.21, J96.22] Sepsis, due to unspecified organism, unspecified whether acute organ dysfunction present [facility]) [A41.9] Patient Active Problem List   Diagnosis Date Noted   Hypoxia 06/21/2021   SOB (shortness of breath) 06/05/2021   Elevated troponin 06/05/2021   Chronic kidney disease, stage 3a ([place])    Acute on chronic respiratory failure with hypoxia and hypercapnia ([place]) 05/19/2021   Atrial fibrillation, chronic ([place]) 05/19/2021   COVID-19 virus infection 05/18/2021   Closed displaced spiral fracture of shaft of left humerus 12/23/2020   Acute on chronic respiratory failure with hypoxia ([place])  09/02/2020   Urinary retention 07/08/2020   BPH (benign prostatic hyperplasia) 07/08/2020   COPD GOLD IV / group D  05/28/2020   Cigarette smoker 05/28/2020   Anemia 06/03/2014   CAD (coronary atherosclerotic disease) 06/03/2014   Acute respiratory failure with  hypercapnia ([place]) 06/01/2014   Diabetes mellitus ([place]) 06/01/2014   Hypertension 06/01/2014   PCP:  [name], MD Pharmacy:   [facility] [place], [place] AT [address] [id number] [place] [id number] Phone: [phone] Fax: [phone]     Social Determinants of Health (SDOH) Interventions    Readmission Risk Interventions Readmission Risk Prevention Plan 06/22/2021 06/08/2021  Transportation Screening Complete Complete  PCP or Specialist Appt within 5-7 Days - Complete  Home Care Screening - Complete  Medication Review (RN Care Manager) Complete -  [place] or Home Care Consult Complete -  SW Recovery Care/Counseling Consult Complete -  Palliative Care Screening Not Applicable -  [place] Not Applicable -

## 2021-06-22 NOTE — Progress Notes (Signed)
Instructed patient on Incentive Spirometer.  Patient was able to do 260m X10.  Patient was trying, but had to keep instructing patient to take slow intake of breaths.  Left IS at patient's bedside.

## 2021-06-22 NOTE — Progress Notes (Signed)
OT Cancellation Note  Patient Details Name: Daniel Barker MRN: [id number] DOB: [birth year]   Cancelled Treatment:    Reason Eval/Treat Not Completed: OT screened, no needs identified, will sign off. Pt able to sit up out of bed and walk forward and backwards without assist. WFL Ue strength and ROM. Screen completed indicating pt does not need OT services.   Madyn Ivins OT, MOT   Larey Seat 06/22/2021, 3:45 PM

## 2021-06-22 NOTE — Progress Notes (Signed)
Patient seemed to be getting confused more per RN; patient placed back on Bipap.

## 2021-06-23 ENCOUNTER — Other Ambulatory Visit: Payer: Self-pay | Admitting: *Deleted

## 2021-06-23 DIAGNOSIS — R0902 Hypoxemia: Secondary | ICD-10-CM | POA: Diagnosis not present

## 2021-06-23 LAB — CBC
HCT: 35.6 % — ABNORMAL LOW (ref 39.0–52.0)
Hemoglobin: 11.2 g/dL — ABNORMAL LOW (ref 13.0–17.0)
MCH: 31.5 pg (ref 26.0–34.0)
MCHC: 31.5 g/dL (ref 30.0–36.0)
MCV: 100 fL (ref 80.0–100.0)
Platelets: 200 10*3/uL (ref 150–400)
RBC: 3.56 MIL/uL — ABNORMAL LOW (ref 4.22–5.81)
RDW: 13.3 % (ref 11.5–15.5)
WBC: 11.1 10*3/uL — ABNORMAL HIGH (ref 4.0–10.5)
nRBC: 0 % (ref 0.0–0.2)

## 2021-06-23 LAB — BASIC METABOLIC PANEL
Anion gap: 6 (ref 5–15)
BUN: 34 mg/dL — ABNORMAL HIGH (ref 8–23)
CO2: 33 mmol/L — ABNORMAL HIGH (ref 22–32)
Calcium: 8.7 mg/dL — ABNORMAL LOW (ref 8.9–10.3)
Chloride: 100 mmol/L (ref 98–111)
Creatinine, Ser: 1.3 mg/dL — ABNORMAL HIGH (ref 0.61–1.24)
GFR, Estimated: 58 mL/min — ABNORMAL LOW (ref >60)
Glucose, Bld: 175 mg/dL — ABNORMAL HIGH (ref 70–99)
Potassium: 4.2 mmol/L (ref 3.5–5.1)
Sodium: 139 mmol/L (ref 135–145)

## 2021-06-23 LAB — GLUCOSE, CAPILLARY: Glucose-Capillary: 145 mg/dL — ABNORMAL HIGH (ref 70–99)

## 2021-06-23 MED ORDER — METHYLPREDNISOLONE 4 MG PO TBPK: ORAL_TABLET | ORAL | 0 refills | Status: DC

## 2021-06-23 MED ORDER — TORSEMIDE 20 MG PO TABS: 20.0000 mg | ORAL_TABLET | Freq: Every day | ORAL | 1 refills | Status: DC

## 2021-06-23 NOTE — Care Management Important Message (Signed)
Important Message  Patient Details  Name: Daniel Barker MRN: [id number] Date of Birth: 11/26/1946   Medicare Important Message Given:  Yes     [name] 06/23/2021, 12:02 PM
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
## 2021-06-23 NOTE — TOC Transition Note (Signed)
Transition of Care [facility]) - CM/SW Discharge Note   Patient Details  Name: Daniel Barker MRN: [id number] Date of Birth: 01/02/1946  [facility]) CM/SW Contact:  Natasha Bence, LCSW Phone Number: 06/23/2021, 10:44 AM   Clinical Narrative:    CSW notified that patient declined [place] services. CSW contacted patient to inquire if patient has reconsidered decision. Patient continues to decline [facility] services and reported that he is moving this week and will not be able to participate in [facility] during move. CSW notified patient that patient can follow up with PCP for [facility] referral once patient is able and agreeable to participate in [facility] services. TOC signing off.    Final next level of care: Home/Self Care Barriers to Discharge: Barriers Resolved   Patient Goals and CMS Choice Patient states their goals for this hospitalization and ongoing recovery are:: Return home CMS Medicare.gov Compare Post Acute Care list provided to:: Patient Choice offered to / list presented to : Patient  Discharge Placement                    Patient and family notified of of transfer: 06/23/21  Discharge Plan and Services In-house Referral: Clinical Social Work              DME Arranged: N/A DME Agency: NA                  Social Determinants of Health ([place]) Interventions     Readmission Risk Interventions Readmission Risk Prevention Plan 06/22/2021 06/08/2021  Transportation Screening Complete Complete  PCP or Specialist Appt within 5-7 Days - Complete  Home Care Screening - Complete  Medication Review ([place]) Complete -  [place] or Home Care Consult Complete -  SW Recovery Care/Counseling Consult Complete -  Palliative Care Screening Not Applicable -  [place] Not Applicable -

## 2021-06-23 NOTE — Progress Notes (Signed)
Encouraged patient to get OOB to ambulate, pt states he would like to rest and try later.

## 2021-06-23 NOTE — Patient Outreach (Signed)
[facility]) Care Management  06/23/2021  Endy Mazzaferro [birth year] [id number]   [place] coordination  Completed [facility] multidisciplinary template and sent to [place] RN CM will follow up with patient within the next 4-7 business days  Tayla Panozzo L. Lavina Hamman, RN, BSN, [place] Coordinator Office number [phone] Mobile number [phone]  Main [facility] number [phone] Fax number [phone]

## 2021-06-23 NOTE — Discharge Summary (Signed)
Physician Discharge Summary [facility]    Patient: Daniel Barker                   Admit date: 06/21/2021   DOB: 03-20-1946             Discharge date:06/23/2021/10:59 AM [id number]                          PCP: Benita Stabile, MD  Disposition: HOME with Home Health   Recommendations for Outpatient Follow-up:   Follow up: With PCP, pulmonologist within 1 to 2 weeks Continue supplemental oxygen, instructed to maintain O2 sat 88-92%, Instructed if inhalers does not shortness of breath may proceed with DuoNeb treatment via nebulizer machine that he has at home. To follow prednisone taper actions His Demadex was reduced from 50 to 20 mg daily as he was found excessively dry on this admission  Discharge Condition: Stable   Code Status:   Code Status: Full Code  Diet recommendation: Cardiac diet   Discharge Diagnoses:    Principal Problem:   Hypoxia Active Problems:   Acute on chronic respiratory failure with hypoxia (HCC)   COPD GOLD IV / group D    Anemia   CAD (coronary atherosclerotic disease)   Diabetes mellitus (HCC)   Hypertension   COVID-19 virus infection   Atrial fibrillation, chronic (HCC)   Chronic kidney disease, stage 3a (HCC)   History of Present Illness/ Hospital Course Charline Bills Summary:    Daniel Barker is a 75 y.o. male with medical history significant of grade 1 diastolic congestive heart failure, COPD, chronic respiratory failure dependent on 2 L of oxygen via nasal cannula continuously, chronic atrial fibrillation not chronically anticoagulated with exception of Plavix, stage IIIa CKD, baseline creatinine 1.3-1.5, CAD, status post CABG, BPH, HLD, HTN, multiple hospitalization for COPD exacerbation, SARS-CoV-2 positive on 6/21 - 05/23/21 Presented today once again for shortness of breath, was found hypotensive,    Patient Denies having: Fever, Chills, Cough, Chest Pain, Abd pain, N/V/D, headache, dizziness, lightheadedness,  Dysuria, Joint  pain, rash, open wounds   ED Course:   Vitals: Blood pressure 101/61, pulse (!) 59, temperature (!) 97.5 F (36.4 C), temperature source Oral, resp. rate (!) 22, SpO2 100 %.  Blood pressure on arrival 81/53 Abnormal labs: WBC 13.4, hemoglobin 11.9, chloride 92, CO2 37, BUN 29, creatinine 1.88, GFR 37 Lactic acid 0.8, BUN 98, Chest x-ray chronic interstitial disease, no acute cardiopulmonary disease Admitted for acute on chronic respiratory failure, hypotension,     Principal Problem:   Acute on chronic respiratory failure with hypoxia (HCC)/in the setting of COPD GOLD IV / group D -Improved, off BiPAP, satting 96% on 2 L -Currently meeting sepsis criteria due to leukocytosis, hypotension, a on CKD, -But the above can be explained by chronic disease (COPD, dehydration, CKD,) -Chest x-ray negative acute infiltrate, chronic history of renal disease -Baseline O2 demand 2 L -In ED was placed on BiPAP 09/20/1939, >>.  Has been weaned off   Ruling out sepsis -Sepsis was ruled out, antibiotics were discontinued -On arrival temp 97.5, pulse 59, RR 26, BP 81/53, WBC 13.4, BUN 29, creatinine 1.88 -Lactic acid 0.8 -On admission met sepsis criteria due to leukocytosis, hypotension, a on CKD, -But the above can be explained by chronic disease (COPD, dehydration, CKD,) -Sepsis protocol initiated... aggressive IV fluid, IV antibiotics (cefepime, vancomycin -Blood cultures >>> no growth to date -Chest x-ray chronic interstitial lung disease negative  for any acute infiltrate -We have discontinued both IV antibiotics, cefepime and vancomycin     Hypotension -Resolved -Blood pressure has improved for gentle IV fluid hydration --discontinuing IV fluids -Likely due to dehydration, hypovolemia -Sepsis was ruled out likely multifactorial including medication, aggressive diuretics, BP meds -According to sepsis protocol we will continue IV fluid, being mindful of volume overload due to history of  diastolic CHF -Was holding home medications of Inderal, Demadex,... Demadex would resume at lower dose 20 mg daily        Anemia of chronic disease -Monitoring H&H, remained stable     CAD (coronary atherosclerotic disease)/with history of CABG -Stable -continue home medication of Plavix,   Hyperlipidemia -not on any statins, continue fenofibrate, stable    Diabetes mellitus ([place]) -no new medication, diabetic diet   Hypertension -hypotensive -resolved BP stabilized, resume beta-blockers and Demadex at lower dose    COVID-19 virus infection -recent known infection-SARS-CoV-2 positive on 05/18/21 According to latest recommendation no need for isolation,      Grade 1 diastolic congestive heart failure, -Withholding diuretics  Demadex due to hypotension, dehydration -Resuming Demadex at lower dose 20 mg daily   -Records reviewed, patient's last echo 06/06/2021 was reviewed ejection fraction 60-65%, negative for any LVH- left heart function preserved -Monitoring daily weights I's and O's -Patient is not on any ACE inhibitor likely due to CKD      COPD-with chronic respiratory failure dependent on 2 L of oxygen via nasal cannula continuously,  Back to baseline 2 L satting 93%   chronic atrial fibrillation -records from his cardiologist Dr. Karlene Lineman reviewed, not chronically anticoagulated with exception of Plavix (it appears due to patient's choice not chronically anticoagulated with other agents )   Stage IIIa CKD,  - baseline creatinine 1.3-1.5, -BUN 29, creatinine 1.88 with GFR 37, baseline 51 >> creatinine 1.58 -Status post FLA fluid hydration, will discontinue -Avoiding nephrotoxins   BPH, -Continue home medication of Flomax   Recent diagnosis of SARS-CoV-2 in infection, multiple hospitalization for COPD exacerbation -Continue BiPAP, with a goal to wean off to O2 by nasal cannula, Goal to maintain O2 sat 88-92% -Continue DuoNeb bronchodilator, mucolytic's, -Antibiotics per  sepsis protocol, with holding steroids use at this time     Cultures: -Blood cultures x2 Antimicrobial: -Broad-spectrum antibiotics cefepime vancomycin..  Discontinued 06/22/2021   Consults called:  None  Code Status:   Code Status: Full Code     Admission status: Patient will be admitted as Inpatient, with a greater than 2 midnight length of stay. Level of care: Stepdown     Family Communication:  none at bedside (The above findings and plan of care has been discussed with patient in detail, the patient expressed understanding and agreement of above plan) ---------------------------------------------------------------------------------------------------------------------------------------------  Dispo: The patient is from: Home              Anticipated d/c is to: Home        Discharge Instructions:   Discharge Instructions     Activity as tolerated - No restrictions   Complete by: As directed    Call MD for:  difficulty breathing, headache or visual disturbances   Complete by: As directed    Call MD for:  persistant dizziness or light-headedness   Complete by: As directed    Call MD for:  persistant nausea and vomiting   Complete by: As directed    Call MD for:  redness, tenderness, or signs of infection (pain, swelling, redness, odor or green/yellow discharge  around incision site)   Complete by: As directed    Call MD for:  temperature >100.4   Complete by: As directed    Diet - low sodium heart healthy   Complete by: As directed    Discharge instructions   Complete by: As directed    Continue current medication, your Demadex has been increased from 50 to 20 mg daily, continue daily weight, your supplemental oxygen, use your nebulizer machine with the bronchodilator medication if inhalers and rescue inhaler not helping. Continue taking Medrol Dosepak with instruction to taper down. Continue using incentive spirometer   Increase activity slowly   Complete by: As  directed         Medication List     TAKE these medications    acetaminophen 500 MG tablet Commonly known as: TYLENOL Take 1,000 mg by mouth every 6 (six) hours as needed.   albuterol 108 (90 Base) MCG/ACT inhaler Commonly known as: VENTOLIN HFA USE 2 PUFFS BY MOUTH EVERY 4 HOURS AS NEEDED   albuterol (2.5 MG/3ML) 0.083% nebulizer solution Commonly known as: PROVENTIL INHALE THE CONTENTS OF 1 VIAL VIA NEBULIZER EVERY 6 HOURS AS NEEDED FOR WHEEZING OR SHORTNESS OF BREATH   ascorbic acid 500 MG tablet Commonly known as: VITAMIN C Take 1 tablet (500 mg total) by mouth daily.   budesonide-formoterol 160-4.5 MCG/ACT inhaler Commonly known as: Symbicort Take 2 puffs first thing in am and then another 2 puffs about 12 hours later.   clopidogrel 75 MG tablet Commonly known as: PLAVIX Take 75 mg by mouth daily.   fenofibrate micronized 134 MG capsule Commonly known as: LOFIBRA Take 134 mg by mouth daily before breakfast.   guaiFENesin-dextromethorphan 100-10 MG/5ML syrup Commonly known as: ROBITUSSIN DM Take 5 mLs by mouth every 4 (four) hours as needed for cough.   methylPREDNISolone 4 MG Tbpk tablet Commonly known as: MEDROL DOSEPAK Medrol Dosepak take as instructed   montelukast 10 MG tablet Commonly known as: SINGULAIR Take 10 mg by mouth at bedtime.   pantoprazole 40 MG tablet Commonly known as: PROTONIX Take 1 tablet (40 mg total) by mouth 2 (two) times daily.   propafenone 225 MG tablet Commonly known as: RYTHMOL Take 1 tablet (225 mg total) by mouth every 8 (eight) hours.   propranolol 40 MG tablet Commonly known as: INDERAL Take 40 mg by mouth 2 (two) times daily.   tamsulosin 0.4 MG Caps capsule Commonly known as: FLOMAX Take 1 capsule (0.4 mg total) by mouth daily.   torsemide 20 MG tablet Commonly known as: Demadex Take 1 tablet (20 mg total) by mouth daily. What changed:  medication strength how much to take   zinc sulfate 220 (50 Zn) MG  capsule Take 1 capsule (220 mg total) by mouth daily.        Allergies  Allergen Reactions   Lipitor [Atorvastatin]      Procedures /Studies:   DG Chest 2 View  Result Date: 06/05/2021 CLINICAL DATA:  Chest pain EXAM: CHEST - 2 VIEW COMPARISON:  May 18, 2021 FINDINGS: Prior median sternotomy. The heart size and mediastinal contours are within normal limits. Aortic atherosclerosis. Pulmonary hyperinflation with apical predominant emphysematous change and chronic bronchitic changes. No new focal consolidation. No pleural effusion. No pneumothorax. Thoracic spondylosis. IMPRESSION: 1. No active cardiopulmonary disease. 2. Chronic changes of COPD. 3. Aortic Atherosclerosis (ICD10-I70.0) and Emphysema (ICD10-J43.9). Electronically Signed   By: Dahlia Bailiff MD   On: 06/05/2021 15:49   NM Pulmonary Perfusion  Result  Date: 06/06/2021 CLINICAL DATA:  Respiratory failure.  Shortness of.  COPD. EXAM: NUCLEAR MEDICINE PERFUSION LUNG SCAN TECHNIQUE: Perfusion images were obtained in multiple projections after intravenous injection of radiopharmaceutical. Ventilation scans intentionally deferred if perfusion scan and chest x-ray adequate for interpretation during COVID 19 epidemic. RADIOPHARMACEUTICALS:  4.4 mCi Tc-74m MAA IV COMPARISON:  Chest radiographs obtained yesterday. FINDINGS: Minimally heterogeneous perfusion of both lungs with no discrete perfusion defects seen. IMPRESSION: 1. Minimally heterogeneous perfusion of both lungs, compatible with the history of COPD. 2. No evidence of pulmonary embolism. Electronically Signed   By: Claudie Revering M.D.   On: 06/06/2021 16:44   DG Chest Port 1 View  Result Date: 06/22/2021 CLINICAL DATA:  Shortness of breath and asthma, COVID positive 06/05/2021 EXAM: PORTABLE CHEST 1 VIEW COMPARISON:  06/21/2021 FINDINGS: Stable cardiomegaly and previous median sternotomy. Similar hyperinflation and left basilar scarring. No superimposed acute pneumonia, significant  collapse or consolidation. Negative for edema, large effusion or pneumothorax. Trachea midline. Aorta atherosclerotic. IMPRESSION: Stable chest exam.  No interval change or acute finding. Aortic Atherosclerosis (ICD10-I70.0). Electronically Signed   By: Jerilynn Mages.  Shick M.D.   On: 06/22/2021 10:09   DG Chest Port 1 View  Result Date: 06/21/2021 CLINICAL DATA:  Shortness of breath. EXAM: PORTABLE CHEST 1 VIEW COMPARISON:  06/05/2022 FINDINGS: Previous median sternotomy CABG procedure normal heart size. Aortic atherosclerosis. Heart size and mediastinal contours appear normal. No pleural effusion or edema. Coarsened interstitial markings identified. No superimposed airspace consolidation. IMPRESSION: 1. No acute cardiopulmonary abnormalities. 2. Chronic interstitial coarsening. Electronically Signed   By: Kerby Moors M.D.   On: 06/21/2021 12:56   ECHOCARDIOGRAM COMPLETE  Result Date: 06/06/2021    ECHOCARDIOGRAM REPORT   Patient Name:   MAXAMUS COLAO Date of Exam: 06/06/2021 Medical Rec #:  [id number]      Height:       67.0 in Accession #:    [id number]     Weight:       154.8 lb Date of Birth:  06/20/1946     BSA:          1.814 m Patient Age:    75 years       BP:           144/77 mmHg Patient Gender: M              HR:           76 bpm. Exam Location:  Inpatient Procedure: 2D Echo, Cardiac Doppler and Color Doppler Indications:    Elevated Troponin  History:        Patient has no prior history of Echocardiogram examinations.                 Prior CABG, COPD, Arrythmias:Atrial Fibrillation; Risk                 Factors:Hypertension, Dyslipidemia and Current Smoker. Stented                 coronary artery (From Hx), Hx of Covid-19 infection.  Sonographer:    Alvino Chapel RCS Referring Phys: [id number] [place]  1. There is exaggerated septal displacement with respiration as well as exaggerated respiratory variation in mitral inflow velocities. This suggests constrictive physiology, but may  also be due to increased work of breathing with COPD exacerbation or asthma. Left ventricular ejection fraction, by estimation, is 60 to 65%. The left ventricle has normal function. The left ventricle has no regional wall motion abnormalities.  Left ventricular diastolic parameters are consistent with Grade I diastolic dysfunction (impaired relaxation).  2. Right ventricular systolic function is normal. The right ventricular size is mildly enlarged.  3. Right atrial size was mildly dilated.  4. The mitral valve is normal in structure. No evidence of mitral valve regurgitation.  5. The aortic valve has an indeterminant number of cusps. There is moderate calcification of the aortic valve. There is moderate thickening of the aortic valve. Aortic valve regurgitation is not visualized. Mild to moderate aortic valve sclerosis/calcification is present, without any evidence of aortic stenosis.  6. The inferior vena cava is dilated in size with >50% respiratory variability, suggesting right atrial pressure of 8 mmHg. FINDINGS  Left Ventricle: There is exaggerated septal displacement with respiration as well as exaggerated respiratory variation in mitral inflow velocities. This suggests constrictive physiology, but may also be due to increased work of breathing with COPD exacerbation or asthma. Left ventricular ejection fraction, by estimation, is 60 to 65%. The left ventricle has normal function. The left ventricle has no regional wall motion abnormalities. The left ventricular internal cavity size was normal in size. There is no left ventricular hypertrophy. Abnormal (paradoxical) septal motion consistent with post-operative status. Left ventricular diastolic parameters are consistent with Grade I diastolic dysfunction (impaired relaxation). Indeterminate filling pressures. Right Ventricle: The right ventricular size is mildly enlarged. No increase in right ventricular wall thickness. Right ventricular systolic function is  normal. Left Atrium: Left atrial size was normal in size. Right Atrium: Right atrial size was mildly dilated. Pericardium: There is no evidence of pericardial effusion. Presence of pericardial fat pad. Mitral Valve: The mitral valve is normal in structure. No evidence of mitral valve regurgitation. Tricuspid Valve: The tricuspid valve is normal in structure. Tricuspid valve regurgitation is not demonstrated. Aortic Valve: The aortic valve has an indeterminant number of cusps. There is moderate calcification of the aortic valve. There is moderate thickening of the aortic valve. Aortic valve regurgitation is not visualized. Mild to moderate aortic valve sclerosis/calcification is present, without any evidence of aortic stenosis. Pulmonic Valve: The pulmonic valve was not well visualized. Pulmonic valve regurgitation is not visualized. Aorta: The aortic root and ascending aorta are structurally normal, with no evidence of dilitation. Venous: The inferior vena cava is dilated in size with greater than 50% respiratory variability, suggesting right atrial pressure of 8 mmHg. IAS/Shunts: No atrial level shunt detected by color flow Doppler.  LEFT VENTRICLE PLAX 2D LVIDd:         4.30 cm  Diastology LVIDs:         2.70 cm  LV e' medial:    7.13 cm/s LV PW:         1.10 cm  LV E/e' medial:  12.1 LV IVS:        0.90 cm  LV e' lateral:   6.85 cm/s LVOT diam:     1.60 cm  LV E/e' lateral: 12.5 LV SV:         55 LV SV Index:   30 LVOT Area:     2.01 cm  RIGHT VENTRICLE RV S prime:     10.00 cm/s TAPSE (M-mode): 1.4 cm LEFT ATRIUM             Index       RIGHT ATRIUM           Index LA diam:        3.20 cm 1.76 cm/m  RA Area:     20.10  cm LA Vol (A2C):   42.9 ml 23.65 ml/m RA Volume:   58.40 ml  32.20 ml/m LA Vol (A4C):   48.9 ml 26.96 ml/m LA Biplane Vol: 48.8 ml 26.91 ml/m  AORTIC VALVE LVOT Vmax:   133.00 cm/s LVOT Vmean:  77.100 cm/s LVOT VTI:    0.273 m  AORTA Ao Root diam: 3.50 cm MITRAL VALVE MV Area (PHT): 2.81 cm     SHUNTS MV Decel Time: 270 msec    Systemic VTI:  0.27 m MV E velocity: 85.90 cm/s  Systemic Diam: 1.60 cm MV A velocity: 96.85 cm/s MV E/A ratio:  0.89 Mihai Croitoru MD Electronically signed by Sanda Klein MD Signature Date/Time: 06/06/2021/5:33:55 PM    Final     Subjective:   Patient was seen and examined 06/23/2021, 10:59 AM Patient stable today. No acute distress.  No issues overnight Stable for discharge.  Discharge Exam:    Vitals:   06/23/21 0400 06/23/21 0500 06/23/21 0600 06/23/21 0745  BP: (!) 128/56 122/66 138/66   Pulse: 68 (!) 50 64   Resp: 19 (!) 23 (!) 22   Temp: 98 F (36.7 C)     TempSrc: Axillary     SpO2: 96% 90% 94% 96%  Weight:  74.7 kg    Height:        General: Pt lying comfortably in bed & appears comfortable on current 2 L of oxygen via nasal cannula,  mild-moderate chronic distress with shortness of breath Cardiovascular: S1 & S2 heard, RRR, S1/S2 +. No murmurs, rubs, gallops or clicks. No JVD or pedal edema. Respiratory: Chronic shortness of breath and respite distress  Chronic wheezing but improved, no rhonchi or crackles. No increased work of breathing. Abdominal:  Non-distended, non-tender & soft. No organomegaly or masses appreciated. Normal bowel sounds heard. CNS: Alert and oriented. No focal deficits. Extremities: no edema, no cyanosis      The results of significant diagnostics from this hospitalization (including imaging, microbiology, ancillary and laboratory) are listed below for reference.      Microbiology:   Recent Results (from the past 240 hour(s))  Culture, blood (Routine X 2) w Reflex to ID Panel     Status: None (Preliminary result)   Collection Time: 06/21/21 12:18 PM   Specimen: BLOOD  Result Value Ref Range Status   Specimen Description BLOOD  Final   Special Requests NONE  Final   Culture   Final    NO GROWTH 2 DAYS Performed at [facility], [address] [place], [place] [address]    Report Status PENDING   Incomplete  Culture, blood (Routine X 2) w Reflex to ID Panel     Status: None (Preliminary result)   Collection Time: 06/21/21  1:04 PM   Specimen: BLOOD  Result Value Ref Range Status   Specimen Description BLOOD RIGHT WRIST  Final   Special Requests   Final    BOTTLES DRAWN AEROBIC ONLY Blood Culture results may not be optimal due to an inadequate volume of blood received in culture bottles   Culture   Final    NO GROWTH 2 DAYS Performed at [facility], [address] [place], [place] [address]    Report Status PENDING  Incomplete  MRSA Next Gen by PCR, Nasal     Status: None   Collection Time: 06/21/21  5:44 PM   Specimen: Nasal Mucosa; Nasal Swab  Result Value Ref Range Status   MRSA by PCR Next Gen NOT DETECTED NOT DETECTED  Final    Comment: (NOTE) The GeneXpert MRSA Assay (FDA approved for NASAL specimens only), is one component of a comprehensive MRSA colonization surveillance program. It is not intended to diagnose MRSA infection nor to guide or monitor treatment for MRSA infections. Test performance is not FDA approved in patients less than 29 years old. Performed at [facility], [address] [place], [place] [address]      Labs:   CBC: Recent Labs  Lab 06/21/21 1218 06/22/21 0423 06/23/21 0644  WBC 13.4* 7.4 11.1*  NEUTROABS 10.4*  --   --   HGB 11.9* 11.2* 11.2*  HCT 38.1* 36.8* 35.6*  MCV 100.0 101.9* 100.0  PLT 245 202 623   Basic Metabolic Panel: Recent Labs  Lab 06/21/21 1218 06/22/21 0423 06/23/21 0644  NA 137 142 139  K 3.9 4.5 4.2  CL 92* 100 100  CO2 37* 34* 33*  GLUCOSE 116* 154* 175*  BUN 29* 34* 34*  CREATININE 1.88* 1.58* 1.30*  CALCIUM 8.7* 8.6* 8.7*   Liver Function Tests: Recent Labs  Lab 06/21/21 1218  AST 15  ALT 16  ALKPHOS 58  BILITOT 1.1  PROT 5.5*  ALBUMIN 3.2*   BNP (last 3 results) Recent Labs    05/18/21 1548 06/05/21 1527 06/21/21 1218  BNP 241.0* 261.9* 112.0*  98.0   Cardiac Enzymes: No results  for input(s): CKTOTAL, CKMB, CKMBINDEX, TROPONINI in the last 168 hours. CBG: Recent Labs  Lab 06/23/21 0820  GLUCAP 145*   Hgb A1c No results for input(s): HGBA1C in the last 72 hours. Lipid Profile No results for input(s): CHOL, HDL, LDLCALC, TRIG, CHOLHDL, LDLDIRECT in the last 72 hours. Thyroid function studies No results for input(s): TSH, T4TOTAL, T3FREE, THYROIDAB in the last 72 hours.  Invalid input(s): FREET3 Anemia work up No results for input(s): VITAMINB12, FOLATE, FERRITIN, TIBC, IRON, RETICCTPCT in the last 72 hours. Urinalysis    Component Value Date/Time   COLORURINE YELLOW 06/21/2021 1740   APPEARANCEUR CLEAR 06/21/2021 1740   LABSPEC 1.018 06/21/2021 1740   PHURINE 5.0 06/21/2021 1740   GLUCOSEU NEGATIVE 06/21/2021 1740   HGBUR NEGATIVE 06/21/2021 1740   BILIRUBINUR NEGATIVE 06/21/2021 1740   KETONESUR 5 (A) 06/21/2021 1740   PROTEINUR NEGATIVE 06/21/2021 1740   NITRITE NEGATIVE 06/21/2021 1740   LEUKOCYTESUR NEGATIVE 06/21/2021 1740         Time coordinating discharge: Over 45 minutes  SIGNED: Deatra James, MD, FACP, [facility]. [facility],  Please use [email or web address] to Page If 7PM-7AM, please contact night-coverage [email or web address], Password [facility] 06/23/2021, 10:59 AM

## 2021-06-23 NOTE — Progress Notes (Signed)
Patient sitting in chair. CPT held at this time.

## 2021-06-23 NOTE — Progress Notes (Signed)
NURSING PROGRESS NOTE  Ahmed Vreeland [id number] Discharge Data: 06/23/2021 3:45 PM Attending Provider: Deatra James, MD [id number], Edwinna Areola, MD     [name] to be D/C'd Home per MD order.  Discussed with the patient the After Visit Summary and all questions fully answered. All IV's discontinued with no bleeding noted. All belongings returned to patient for patient to take home.   Last Vital Signs:  Blood pressure 138/66, pulse 64, temperature 98 F (36.7 C), temperature source Axillary, resp. rate (!) 22, height '5\' 6"'$  (1.676 m), weight 74.7 kg, SpO2 96 %.  Discharge Medication List Allergies as of 06/23/2021       Reactions   Lipitor [atorvastatin]         Medication List     TAKE these medications    acetaminophen 500 MG tablet Commonly known as: TYLENOL Take 1,000 mg by mouth every 6 (six) hours as needed.   albuterol 108 (90 Base) MCG/ACT inhaler Commonly known as: VENTOLIN HFA USE 2 PUFFS BY MOUTH EVERY 4 HOURS AS NEEDED   albuterol (2.5 MG/3ML) 0.083% nebulizer solution Commonly known as: PROVENTIL INHALE THE CONTENTS OF 1 VIAL VIA NEBULIZER EVERY 6 HOURS AS NEEDED FOR WHEEZING OR SHORTNESS OF BREATH   ascorbic acid 500 MG tablet Commonly known as: VITAMIN C Take 1 tablet (500 mg total) by mouth daily.   budesonide-formoterol 160-4.5 MCG/ACT inhaler Commonly known as: Symbicort Take 2 puffs first thing in am and then another 2 puffs about 12 hours later.   clopidogrel 75 MG tablet Commonly known as: PLAVIX Take 75 mg by mouth daily.   fenofibrate micronized 134 MG capsule Commonly known as: LOFIBRA Take 134 mg by mouth daily before breakfast.   guaiFENesin-dextromethorphan 100-10 MG/5ML syrup Commonly known as: ROBITUSSIN DM Take 5 mLs by mouth every 4 (four) hours as needed for cough.   methylPREDNISolone 4 MG Tbpk tablet Commonly known as: MEDROL DOSEPAK Medrol Dosepak take as instructed   montelukast 10 MG tablet Commonly known as:  SINGULAIR Take 10 mg by mouth at bedtime.   pantoprazole 40 MG tablet Commonly known as: PROTONIX Take 1 tablet (40 mg total) by mouth 2 (two) times daily.   propafenone 225 MG tablet Commonly known as: RYTHMOL Take 1 tablet (225 mg total) by mouth every 8 (eight) hours.   propranolol 40 MG tablet Commonly known as: INDERAL Take 40 mg by mouth 2 (two) times daily.   tamsulosin 0.4 MG Caps capsule Commonly known as: FLOMAX Take 1 capsule (0.4 mg total) by mouth daily.   torsemide 20 MG tablet Commonly known as: Demadex Take 1 tablet (20 mg total) by mouth daily. What changed:  medication strength how much to take   zinc sulfate 220 (50 Zn) MG capsule Take 1 capsule (220 mg total) by mouth daily.         Doristine Devoid, RN

## 2021-06-23 NOTE — Progress Notes (Signed)
Per pt's daughter, [name], she will not be able to pick patient up for discharge until 4-5 pm.

## 2021-06-26 LAB — CULTURE, BLOOD (ROUTINE X 2)
Culture: NO GROWTH
Culture: NO GROWTH

## 2021-06-29 ENCOUNTER — Other Ambulatory Visit: Payer: Self-pay | Admitting: Urology

## 2021-06-30 ENCOUNTER — Other Ambulatory Visit: Payer: Self-pay | Admitting: *Deleted
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
## 2021-07-01 ENCOUNTER — Other Ambulatory Visit: Payer: Self-pay | Admitting: *Deleted

## 2021-07-01 DIAGNOSIS — E782 Mixed hyperlipidemia: Secondary | ICD-10-CM | POA: Diagnosis not present

## 2021-07-01 DIAGNOSIS — J9621 Acute and chronic respiratory failure with hypoxia: Secondary | ICD-10-CM | POA: Diagnosis not present

## 2021-07-01 DIAGNOSIS — I959 Hypotension, unspecified: Secondary | ICD-10-CM | POA: Diagnosis not present

## 2021-07-01 DIAGNOSIS — G250 Essential tremor: Secondary | ICD-10-CM | POA: Diagnosis not present

## 2021-07-01 DIAGNOSIS — Z0001 Encounter for general adult medical examination with abnormal findings: Secondary | ICD-10-CM | POA: Diagnosis not present

## 2021-07-01 DIAGNOSIS — Z7689 Persons encountering health services in other specified circumstances: Secondary | ICD-10-CM | POA: Diagnosis not present

## 2021-07-01 DIAGNOSIS — D638 Anemia in other chronic diseases classified elsewhere: Secondary | ICD-10-CM | POA: Diagnosis not present

## 2021-07-01 DIAGNOSIS — J449 Chronic obstructive pulmonary disease, unspecified: Secondary | ICD-10-CM | POA: Diagnosis not present

## 2021-07-01 DIAGNOSIS — I5032 Chronic diastolic (congestive) heart failure: Secondary | ICD-10-CM | POA: Diagnosis not present

## 2021-07-01 DIAGNOSIS — N1831 Chronic kidney disease, stage 3a: Secondary | ICD-10-CM | POA: Diagnosis not present

## 2021-07-01 DIAGNOSIS — I482 Chronic atrial fibrillation, unspecified: Secondary | ICD-10-CM | POA: Diagnosis not present

## 2021-07-01 DIAGNOSIS — I2510 Atherosclerotic heart disease of native coronary artery without angina pectoris: Secondary | ICD-10-CM | POA: Diagnosis not present

## 2021-07-01 NOTE — Patient Outreach (Signed)
[facility]) Care Management  07/01/2021  Tatsumi Dowty July 12, 1946 [id number]   [place] coordination- [facility] multidisciplinary team discussion  Patient reviewed with [facility] multidisciplinary (MCD) team members Pending further outreach to patient after unsuccessful attempt on 06/30/21   Recommendations - evaluate smoke cessation needs/second hand smoke exposure - attempt connection with possible [place] covid long hauler program/provider (? Local health department) -  Review with primary care provider (PCP) for possible palliative care needs -assess pt decline of anticoagulants  Plan [facility] RN CM will follow up with recommendations and update MCD team    Toccara Alford L. Lavina Hamman, RN, BSN, [place] Coordinator Office number [phone] Mobile number [phone]  Main [facility] number [phone] Fax number [phone]

## 2021-07-02 ENCOUNTER — Emergency Department (HOSPITAL_COMMUNITY)
Admission: EM | Admit: 2021-07-02 | Discharge: 2021-07-03 | Disposition: A | Discharge status: Home / Self Care | Payer: Medicare Other | Attending: Emergency Medicine | Admitting: Emergency Medicine

## 2021-07-02 ENCOUNTER — Emergency Department (HOSPITAL_COMMUNITY): Payer: Medicare Other

## 2021-07-02 ENCOUNTER — Other Ambulatory Visit: Payer: Self-pay

## 2021-07-02 ENCOUNTER — Encounter (HOSPITAL_COMMUNITY): Payer: Self-pay

## 2021-07-02 DIAGNOSIS — J441 Chronic obstructive pulmonary disease with (acute) exacerbation: Secondary | ICD-10-CM | POA: Insufficient documentation

## 2021-07-02 DIAGNOSIS — Z7951 Long term (current) use of inhaled steroids: Secondary | ICD-10-CM | POA: Diagnosis not present

## 2021-07-02 DIAGNOSIS — Z7902 Long term (current) use of antithrombotics/antiplatelets: Secondary | ICD-10-CM | POA: Insufficient documentation

## 2021-07-02 DIAGNOSIS — I129 Hypertensive chronic kidney disease with stage 1 through stage 4 chronic kidney disease, or unspecified chronic kidney disease: Secondary | ICD-10-CM | POA: Insufficient documentation

## 2021-07-02 DIAGNOSIS — Z951 Presence of aortocoronary bypass graft: Secondary | ICD-10-CM | POA: Insufficient documentation

## 2021-07-02 DIAGNOSIS — N1831 Chronic kidney disease, stage 3a: Secondary | ICD-10-CM | POA: Insufficient documentation

## 2021-07-02 DIAGNOSIS — Z87891 Personal history of nicotine dependence: Secondary | ICD-10-CM | POA: Diagnosis not present

## 2021-07-02 DIAGNOSIS — I2510 Atherosclerotic heart disease of native coronary artery without angina pectoris: Secondary | ICD-10-CM | POA: Insufficient documentation

## 2021-07-02 DIAGNOSIS — R0602 Shortness of breath: Secondary | ICD-10-CM | POA: Diagnosis not present

## 2021-07-02 DIAGNOSIS — I4891 Unspecified atrial fibrillation: Secondary | ICD-10-CM | POA: Insufficient documentation

## 2021-07-02 DIAGNOSIS — R600 Localized edema: Secondary | ICD-10-CM | POA: Diagnosis not present

## 2021-07-02 DIAGNOSIS — J45909 Unspecified asthma, uncomplicated: Secondary | ICD-10-CM | POA: Insufficient documentation

## 2021-07-02 DIAGNOSIS — R0600 Dyspnea, unspecified: Secondary | ICD-10-CM | POA: Diagnosis present

## 2021-07-02 DIAGNOSIS — Z8616 Personal history of COVID-19: Secondary | ICD-10-CM | POA: Insufficient documentation

## 2021-07-02 DIAGNOSIS — E1122 Type 2 diabetes mellitus with diabetic chronic kidney disease: Secondary | ICD-10-CM | POA: Insufficient documentation

## 2021-07-02 LAB — BASIC METABOLIC PANEL
Anion gap: 7 (ref 5–15)
BUN: 28 mg/dL — ABNORMAL HIGH (ref 8–23)
CO2: 27 mmol/L (ref 22–32)
Calcium: 9.6 mg/dL (ref 8.9–10.3)
Chloride: 107 mmol/L (ref 98–111)
Creatinine, Ser: 0.8 mg/dL (ref 0.61–1.24)
GFR, Estimated: >60 mL/min (ref >60)
Glucose, Bld: 109 mg/dL — ABNORMAL HIGH (ref 70–99)
Potassium: 4.5 mmol/L (ref 3.5–5.1)
Sodium: 141 mmol/L (ref 135–145)

## 2021-07-02 LAB — HEPATIC FUNCTION PANEL
ALT: 20 U/L (ref 0–44)
AST: 16 U/L (ref 15–41)
Albumin: 3.7 g/dL (ref 3.5–5.0)
Alkaline Phosphatase: 59 U/L (ref 38–126)
Bilirubin, Direct: 0.2 mg/dL (ref 0.0–0.2)
Indirect Bilirubin: 0.7 mg/dL (ref 0.3–0.9)
Total Bilirubin: 0.9 mg/dL (ref 0.3–1.2)
Total Protein: 6.5 g/dL (ref 6.5–8.1)

## 2021-07-02 LAB — CBC
HCT: 45.1 % (ref 39.0–52.0)
Hemoglobin: 14.4 g/dL (ref 13.0–17.0)
MCH: 31 pg (ref 26.0–34.0)
MCHC: 31.9 g/dL (ref 30.0–36.0)
MCV: 97 fL (ref 80.0–100.0)
Platelets: 206 10*3/uL (ref 150–400)
RBC: 4.65 MIL/uL (ref 4.22–5.81)
RDW: 14.7 % (ref 11.5–15.5)
WBC: 9 10*3/uL (ref 4.0–10.5)
nRBC: 0 % (ref 0.0–0.2)

## 2021-07-02 LAB — BRAIN NATRIURETIC PEPTIDE: B Natriuretic Peptide: 302 pg/mL — ABNORMAL HIGH (ref 0.0–100.0)

## 2021-07-02 MED ORDER — ALBUTEROL SULFATE (2.5 MG/3ML) 0.083% IN NEBU
5.0000 mg | INHALATION_SOLUTION | Freq: Once | RESPIRATORY_TRACT | Status: AC
  Administered 2021-07-02: 5 mg via RESPIRATORY_TRACT
  Filled 2021-07-02: qty 6

## 2021-07-02 MED ORDER — IPRATROPIUM BROMIDE 0.02 % IN SOLN
0.5000 mg | Freq: Once | RESPIRATORY_TRACT | Status: AC
  Administered 2021-07-02: 0.5 mg via RESPIRATORY_TRACT
  Filled 2021-07-02: qty 2.5

## 2021-07-02 MED ORDER — METHYLPREDNISOLONE SODIUM SUCC 125 MG IJ SOLR
125.0000 mg | Freq: Once | INTRAMUSCULAR | Status: AC
  Administered 2021-07-02: 125 mg via INTRAVENOUS
  Filled 2021-07-02: qty 2

## 2021-07-02 NOTE — ED Triage Notes (Signed)
Pt presents to ED from home for sob- hx of COPD and covid in June 2022. Pt says this started 2 days ago, saw his PCP yesterday and was told it may be residual from Covid. Pt wears 2 L at home. Pt 92% on room air and can speak full sentences. Pt placed on 2L oxygen via [place].

## 2021-07-02 NOTE — ED Provider Notes (Signed)
[facility] EMERGENCY DEPARTMENT Provider Note   CSN: [id number] Arrival date & time: 07/02/21  1931     History Chief Complaint  Patient presents with   Shortness of Breath    COPD    Daniel Barker is a 75 y.o. male.  HPI 75 year old male presents with a chief complaint of dyspnea.  He has been feeling short of breath particularly bad for the last 2 days.  He just got off a Medrol Dosepak.  He has had some wheezing as well as her chronic cough since developing COVID about 5 weeks ago.  No fevers or chest pain.  He has had some chronic leg swelling.  He feels like over the last several months his abdomen has been distended and a chronic full feeling.  He was told by his PCP he has extra fluid.  Past Medical History:  Diagnosis Date   Asthma    Atrial fibrillation (HCC)    BPH (benign prostatic hyperplasia)    Cigarette smoker    CKD (chronic kidney disease)    COPD (chronic obstructive pulmonary disease) (HCC)    Coronary artery disease    Cough    High cholesterol    Hx of CABG    Hypercholesteremia    Hypertension    Localized edema    Stented coronary artery     Patient Active Problem List   Diagnosis Date Noted   Hypoxia 06/21/2021   SOB (shortness of breath) 06/05/2021   Elevated troponin 06/05/2021   Chronic kidney disease, stage 3a ([place])    Acute on chronic respiratory failure with hypoxia and hypercapnia ([place]) 05/19/2021   Atrial fibrillation, chronic ([place]) 05/19/2021   COVID-19 virus infection 05/18/2021   Closed displaced spiral fracture of shaft of left humerus 12/23/2020   Acute on chronic respiratory failure with hypoxia ([place]) 09/02/2020   Urinary retention 07/08/2020   BPH (benign prostatic hyperplasia) 07/08/2020   COPD GOLD IV / group D  05/28/2020   Cigarette smoker 05/28/2020   Anemia 06/03/2014   CAD (coronary atherosclerotic disease) 06/03/2014   Acute respiratory failure with hypercapnia ([place]) 06/01/2014   Diabetes mellitus ([place]) 06/01/2014    Hypertension 06/01/2014    Past Surgical History:  Procedure Laterality Date   CARDIAC SURGERY     CORONARY ARTERY BYPASS GRAFT         Family History  Problem Relation Age of Onset   Hypertension Mother    Clotting disorder Mother    COPD Father     Social History   Tobacco Use   Smoking status: Former    Packs/day: 1.00    Years: 60.00    Pack years: 60.00    Types: Cigarettes    Quit date: 09/01/2020    Years since quitting: 0.8   Smokeless tobacco: Never  Vaping Use   Vaping Use: Never used  Substance Use Topics   Alcohol use: Never   Drug use: Never    Home Medications Prior to Admission medications   Medication Sig Start Date End Date Taking? Authorizing Provider  acetaminophen (TYLENOL) 500 MG tablet Take 1,000 mg by mouth every 6 (six) hours as needed.    [provider]  albuterol (PROVENTIL) (2.5 MG/3ML) 0.083% nebulizer solution INHALE THE CONTENTS OF 1 VIAL VIA NEBULIZER EVERY 6 HOURS AS NEEDED FOR WHEEZING OR SHORTNESS OF BREATH 06/09/21   Tanda Rockers, MD  albuterol (VENTOLIN HFA) 108 (90 Base) MCG/ACT inhaler USE 2 PUFFS BY MOUTH EVERY 4 HOURS AS  NEEDED 05/04/21   Tanda Rockers, MD  ascorbic acid (VITAMIN C) 500 MG tablet Take 1 tablet (500 mg total) by mouth daily. 05/24/21   Barton Dubois, MD  budesonide-formoterol [facility]) 160-4.5 MCG/ACT inhaler Take 2 puffs first thing in am and then another 2 puffs about 12 hours later. 10/27/20   Tanda Rockers, MD  clopidogrel (PLAVIX) 75 MG tablet Take 75 mg by mouth daily.    [provider]  fenofibrate micronized (LOFIBRA) 134 MG capsule Take 134 mg by mouth daily before breakfast.    [provider]  guaiFENesin-dextromethorphan (ROBITUSSIN DM) 100-10 MG/5ML syrup Take 5 mLs by mouth every 4 (four) hours as needed for cough. 05/23/21   Barton Dubois, MD  methylPREDNISolone (MEDROL DOSEPAK) 4 MG TBPK tablet Medrol Dosepak take as instructed 06/23/21   Shahmehdi, Erling Conte A, MD   montelukast (SINGULAIR) 10 MG tablet Take 10 mg by mouth at bedtime.    [provider]  pantoprazole (PROTONIX) 40 MG tablet Take 1 tablet (40 mg total) by mouth 2 (two) times daily. 05/23/21   Barton Dubois, MD  propafenone (RYTHMOL) 225 MG tablet Take 1 tablet (225 mg total) by mouth every 8 (eight) hours. 02/03/21   Josue Hector, MD  propranolol (INDERAL) 40 MG tablet Take 40 mg by mouth 2 (two) times daily. 05/05/21   [provider]  tamsulosin (FLOMAX) 0.4 MG CAPS capsule TAKE 1 CAPSULE(0.4 MG) BY MOUTH DAILY 06/30/21   McKenzie, Candee Furbish, MD  torsemide (DEMADEX) 20 MG tablet Take 1 tablet (20 mg total) by mouth daily. 06/23/21 07/23/21  ShahmehdiValeria Batman, MD  zinc sulfate 220 (50 Zn) MG capsule Take 1 capsule (220 mg total) by mouth daily. 05/24/21   Barton Dubois, MD    Allergies    Lipitor [atorvastatin]  Review of Systems   Review of Systems  Constitutional:  Negative for fever.  Respiratory:  Positive for cough, shortness of breath and wheezing.   Cardiovascular:  Positive for leg swelling. Negative for chest pain.  All other systems reviewed and are negative.  Physical Exam Updated Vital Signs BP 137/68   Pulse 64   Temp 98 F (36.7 C) (Oral)   Resp 20   Ht '5\' 6"'$  (1.676 m)   Wt 74.7 kg   SpO2 100%   BMI 26.58 kg/m   Physical Exam Vitals and nursing note reviewed.  Constitutional:      Appearance: He is well-developed.  HENT:     Head: Normocephalic and atraumatic.     Right Ear: External ear normal.     Left Ear: External ear normal.     Nose: Nose normal.  Eyes:     General:        Right eye: No discharge.        Left eye: No discharge.  Cardiovascular:     Rate and Rhythm: Normal rate and regular rhythm.     Heart sounds: Normal heart sounds.  Pulmonary:     Effort: Pulmonary effort is normal. Tachypnea present. No accessory muscle usage or respiratory distress.     Breath sounds: Decreased breath sounds and wheezing present.   Abdominal:     Palpations: Abdomen is soft.     Tenderness: There is no abdominal tenderness.     Comments: No obvious ascites/distention  Musculoskeletal:     Cervical back: Neck supple.     Right lower leg: Edema present.     Left lower leg: Edema present.  Comments: Mild ankle/feet edema  Skin:    General: Skin is warm and dry.  Neurological:     Mental Status: He is alert.  Psychiatric:        Mood and Affect: Mood is not anxious.    ED Results / Procedures / Treatments   Labs (all labs ordered are listed, but only abnormal results are displayed) Labs Reviewed  BASIC METABOLIC PANEL - Abnormal; Notable for the following components:      Result Value   Glucose, Bld 109 (*)    BUN 28 (*)    All other components within normal limits  BRAIN NATRIURETIC PEPTIDE - Abnormal; Notable for the following components:   B Natriuretic Peptide 302.0 (*)    All other components within normal limits  CBC  HEPATIC FUNCTION PANEL    EKG None  Radiology DG Chest 2 View  Result Date: 07/02/2021 CLINICAL DATA:  Shortness of breath EXAM: CHEST - 2 VIEW COMPARISON:  06/22/2021 FINDINGS: Prior CABG. Heart is normal size. Linear scarring in the lung bases. Mild hyperinflation. No effusions or acute bony abnormality. IMPRESSION: COPD/chronic changes. No active disease. Electronically Signed   By: Rolm Baptise M.D.   On: 07/02/2021 20:53    Procedures Procedures   Medications Ordered in ED Medications  albuterol (PROVENTIL) (2.5 MG/3ML) 0.083% nebulizer solution 5 mg (5 mg Nebulization Given 07/02/21 2240)  ipratropium (ATROVENT) nebulizer solution 0.5 mg (0.5 mg Nebulization Given 07/02/21 2240)  methylPREDNISolone sodium succinate (SOLU-MEDROL) 125 mg/2 mL injection 125 mg (125 mg Intravenous Given 07/02/21 2235)  albuterol (PROVENTIL) (2.5 MG/3ML) 0.083% nebulizer solution 5 mg (5 mg Nebulization Given 07/02/21 2353)  ipratropium (ATROVENT) nebulizer solution 0.5 mg (0.5 mg Nebulization  Given 07/02/21 2353)    ED Course  I have reviewed the triage vital signs and the nursing notes.  Pertinent labs & imaging results that were available during my care of the patient were reviewed by me and considered in my medical decision making (see chart for details).    MDM Rules/Calculators/A&P                           Patient appears to have a COPD exacerbation.  He has had improvement with albuterol.  He has a nonspecific BNP but his chest x-ray is clear and my suspicion of significant CHF is fairly low.  He was given Solu-Medrol.  Given improvement but not all the way back to normal will give a second treatment.  Care to Dr. Roxanne Mins.  Low suspicion for ACS, PE, bacterial pneumonia. Final Clinical Impression(s) / ED Diagnoses Final diagnoses:  None    Rx / DC Orders ED Discharge Orders     None        Sherwood Gambler, MD 07/03/21 [phone]

## 2021-07-02 NOTE — ED Notes (Signed)
RT at bedside.

## 2021-07-03 ENCOUNTER — Other Ambulatory Visit: Payer: Self-pay

## 2021-07-03 ENCOUNTER — Other Ambulatory Visit: Payer: Self-pay | Admitting: Internal Medicine

## 2021-07-03 DIAGNOSIS — Z8781 Personal history of (healed) traumatic fracture: Secondary | ICD-10-CM | POA: Insufficient documentation

## 2021-07-03 DIAGNOSIS — N1831 Chronic kidney disease, stage 3a: Secondary | ICD-10-CM | POA: Diagnosis present

## 2021-07-03 DIAGNOSIS — I5033 Acute on chronic diastolic (congestive) heart failure: Secondary | ICD-10-CM | POA: Insufficient documentation

## 2021-07-03 DIAGNOSIS — I5032 Chronic diastolic (congestive) heart failure: Secondary | ICD-10-CM | POA: Insufficient documentation

## 2021-07-03 IMAGING — DX DG CHEST 1V PORT
1 series · 1 of 1 positions shown · non-contrast
Comparison: 04/05/2021, CT 04/05/2021

CLINICAL DATA: Shortness of breath

EXAM:
PORTABLE CHEST 1 VIEW

[chest ap]
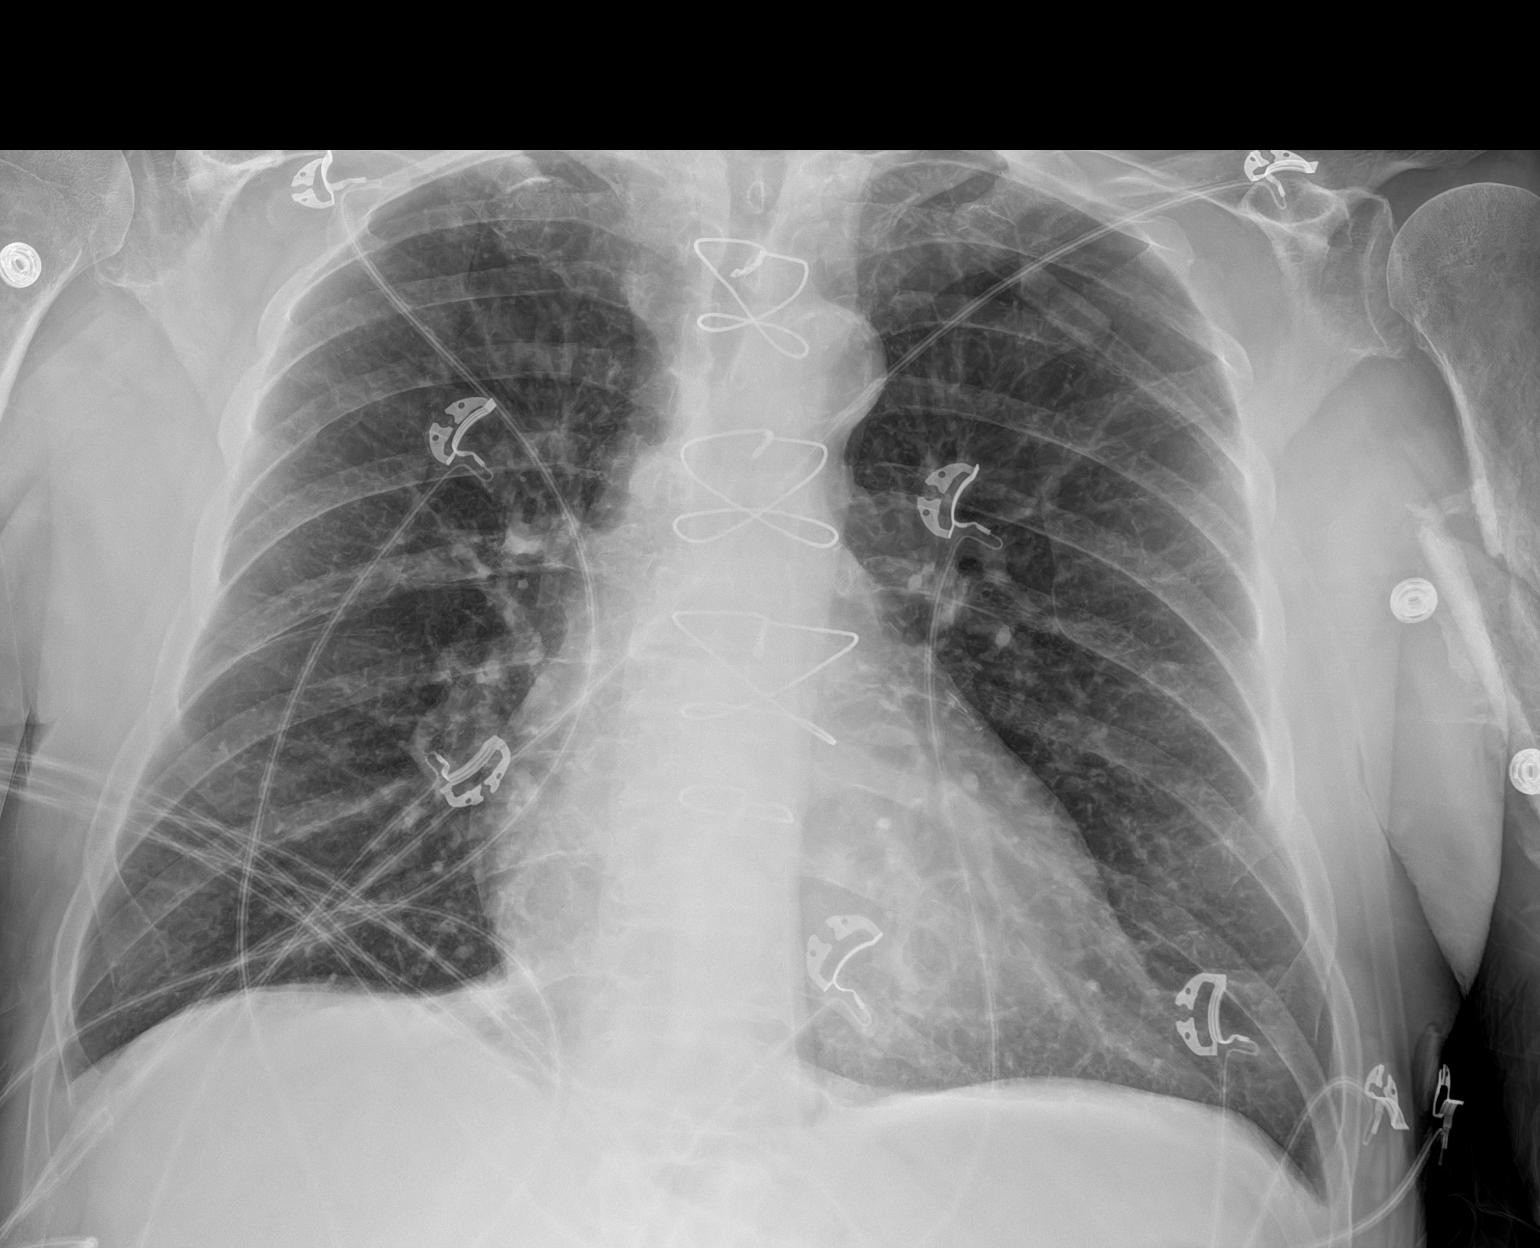

[1 of 1 positions shown; findings below may reference images not displayed]

FINDINGS: Post sternotomy changes. Emphysematous disease and mild bronchitic
change. No focal opacity or pleural effusion. Stable
cardiomediastinal silhouette with aortic atherosclerosis. No
pneumothorax.
IMPRESSION: No active disease.  Emphysema and mild bronchitic changes

## 2021-07-03 MED ORDER — IPRATROPIUM-ALBUTEROL 0.5-2.5 (3) MG/3ML IN SOLN
3.0000 mL | Freq: Once | RESPIRATORY_TRACT | Status: AC
  Administered 2021-07-03: 3 mL via RESPIRATORY_TRACT
  Filled 2021-07-03: qty 3

## 2021-07-03 MED ORDER — PREDNISONE 50 MG PO TABS: 50.0000 mg | ORAL_TABLET | Freq: Every day | ORAL | 0 refills | Status: DC

## 2021-07-03 NOTE — Discharge Instructions (Addendum)
Continue using your home inhaler and nebulizer as needed.  Return to the emergency department if symptoms or not being adequately controlled at home.

## 2021-07-03 NOTE — ED Provider Notes (Signed)
Care assumed from Dr. Regenia Skeeter, patient with COPD exacerbation, needs to reassess after nebulizer treatment.  12:38 AM Following nebulizer treatment, patient states only minimal improvement.  On exam, airflow is diminished throughout and there are faint expiratory wheezes which are more prominent on the left.  We will repeat albuterol with ipratropium.  3:02 AM Following second nebulizer treatment, patient was resting comfortably and fell asleep.  On reexam, lungs are clear.  He is felt to be safe for discharge.  He has had several hospitalizations for COPD recently, and he needs to see his pulmonologist to try to optimize his home regimen.  He is discharged with prescription for prednisone.   Delora Fuel, MD [id number] [phone]

## 2021-07-04 ENCOUNTER — Other Ambulatory Visit: Payer: Self-pay | Admitting: Internal Medicine

## 2021-07-05 ENCOUNTER — Other Ambulatory Visit: Payer: Self-pay | Admitting: *Deleted

## 2021-07-05 ENCOUNTER — Telehealth: Payer: Self-pay | Admitting: Internal Medicine

## 2021-07-05 ENCOUNTER — Other Ambulatory Visit: Payer: Self-pay

## 2021-07-05 ENCOUNTER — Encounter: Payer: Self-pay | Admitting: *Deleted

## 2021-07-05 MED ORDER — ALBUTEROL SULFATE (2.5 MG/3ML) 0.083% IN NEBU: 2.5000 mg | INHALATION_SOLUTION | Freq: Four times a day (QID) | RESPIRATORY_TRACT | 2 refills | Status: DC | PRN

## 2021-07-05 NOTE — Telephone Encounter (Signed)
Called and spoke with patient who states that he needs his albuterol nebulizer medication refilled and that he went to pharmacy to pick it up and it was his rescue inhaler not his nebulizer medication. RX has been sent to pharmacy. Advised patient to call us after they spoke with [employer] if they needed anything from [place] with portable oxygen. He expressed understanding. Nothing further needed at this time.
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
## 2021-07-12 ENCOUNTER — Telehealth: Payer: Self-pay | Admitting: Internal Medicine

## 2021-07-12 MED ORDER — BISOPROLOL FUMARATE 5 MG PO TABS: 5.0000 mg | ORAL_TABLET | Freq: Two times a day (BID) | ORAL | 5 refills | Status: DC

## 2021-07-12 NOTE — Telephone Encounter (Signed)
Call made to patient, confirmed DOB. Patient reports having increased congestions in his chest and he feels like it is draining. Runny nose that is worse at night. He confirms he is using a symbicort daily, and using his nebulizer about 2-3x/day. He does not feel like the inhaler is helping much. Denies fevers, body aches, or chills. He is no longer taking the prednisone or the medrol stating that was from a recent hospital visit on 8/5. He states he is aware he has an upcoming appt but requesting that something be called in for the congestion. Denies that he has tried anything OTC to help with congestion.   MW please advise. Thanks :)

## 2021-07-12 NOTE — Telephone Encounter (Signed)
For congestion can try mucinex dm 1200 mg every 12 hours as needed  The inderol is likely blocking the benefit of the albuterol and will need to consider changing it out to bisoprolol 5 mg bid - 0k to  do this now so we can see the benefit at f/u or let whoever wrote the inderol make the change (maybe it's being used for tremor which will be worse on bisoprolol but breathing should be a lot better and less need for albuterol which adds to the tremor anyway)
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
## 2021-07-12 NOTE — Telephone Encounter (Signed)
Called and spoke with pt letting him know recs stated by MW and he verbalized understanding. Pt said to go ahead and send Rx for bisoprolol to pharmacy for him so he could start taking that instead of the propranolol to see if he sees any benefit.  Verified preferred pharmacy and Rx for bisoprolol was sent in for pt. Nothing further needed.

## 2021-07-13 ENCOUNTER — Other Ambulatory Visit: Payer: Self-pay

## 2021-07-13 ENCOUNTER — Other Ambulatory Visit: Payer: Self-pay | Admitting: *Deleted

## 2021-07-13 NOTE — Patient Outreach (Addendum)
[facility]) Care Management  07/13/2021  Daniel Barker January 05, 1946 [id number]   [facility] outreach to complex care patient   Daniel Daniel Barker was referred to [facility] on 05/25/21 for post hospital services with diagnosis Chronic obstructive pulmonary disease (COPD) and for complex case management services and disease management   Patient is able to verify HIPAA identified   Assessment Daniel Barker discussed 2 primary concerns today and reports "No not really' when RN CM inquires if he is feeling better today  1) Worsening shortness of breath symptoms He reports poor sleep and shortness of breath when only lying down for the last 3 nights He is following his action plan and has outreached to his pulmonologist  He has picked up medicines ordered by pulmonology medicine MD, bisoprolol and mucinex 1200 Discussed why these medicines prescribed  Discussed CHF, encouraged weighing daily and the importance in order to be able to outreach to MD to report any worsening symptoms He voiced understanding Reviewed and reminded him on following his COPD & CHF action plan and to call the pulmonology or cardiology office even if have upcoming appointments   2) Continued care of his wife with dementia while he is sick He reports in his absence his other step daughter from [place] arrived to his step daughter, Daniel Barker's home and is helping to take care for his wife [facility] RN CM re sent/text the information for [place] Department of Social Services (DSS) to Daniel Barker, Daniel Barker and his grand daughter Daniel Barker as he requested    Sent via [employer] of personal care services, information on [place] senior services, [place], [employer] and [place] DSS  He concludes the call today as he wants to take a nap while he can as he has not slept well recently   RN CM outreached and left a message via Dr [name] front desk answering service as the office is closed for office meeting for the rest of the day   Left message sharing the [facility] multidisciplinary team suggestion of the pcp office initiation of a possible palliative care goals of care conversation with the patient considering his recent admissions and noting he is the caregiver of his wife (dementia) Daniel Barker has informed RN CM he has advance directives and today another step daughter is assisting with his wife's care.    Patient Active Problem List   Diagnosis Date Noted   Chronic kidney disease, stage 3a ([place]) 07/03/2021   Chronic diastolic heart failure ([place]) 07/03/2021   History of fracture 07/03/2021   SOB (shortness of breath) 06/05/2021   Elevated troponin 06/05/2021   Acute on chronic respiratory failure with hypoxia and hypercapnia ([place]) 05/19/2021   COVID-19 virus infection 05/18/2021   Essential tremor 05/04/2021   Chronic atrial fibrillation ([place]) 04/25/2021   Chronic kidney disease 04/25/2021   Asthma 04/25/2021   H/O coronary angioplasty 04/25/2021   Impaired fasting glucose 04/25/2021   Cellulitis 04/25/2021   Edema 04/25/2021   Nicotine dependence 04/25/2021   Overweight 04/25/2021   Prediabetes 04/25/2021   Tremor 04/25/2021   Vitamin D deficiency 04/25/2021   Mixed hyperlipidemia 04/16/2021   Closed displaced spiral fracture of shaft of left humerus 12/23/2020   Chronic hypoxemic respiratory failure ([place]) 09/02/2020   Urinary retention 07/08/2020   Benign prostatic hyperplasia with lower urinary tract symptoms 07/08/2020   Chronic obstructive lung disease ([place]) 05/28/2020   Cigarette smoker 05/28/2020   Anemia of chronic disease 06/03/2014   Atherosclerosis of coronary artery without angina pectoris 06/03/2014  Acute respiratory failure with hypercapnia ([place]) 06/01/2014   Diabetes mellitus ([place]) 06/01/2014   Low blood pressure 06/01/2014    Plans Patient agrees to care plan and follow up within the next 30 business days  Goals Addressed               This Visit's Progress     Patient Stated      [facility]) Make and Keep All Appointments (pt-stated)   On track     Timeframe:  Short-Term Goal Priority:  Medium Start Date:          06/02/21                   Expected End Date:      07/28/21                 Follow Up Date 07/19/21 Barriers:  Hearing Knowledge  - ask family or friend for a ride - call to cancel if needed    Notes:  07/13/21 attending all appointments, outreaching to MD offices per action plan, pending cardiology appointment, extra family present to assist him with care of his wife 06/30/21 unable to speak at time of [facility] outreach 06/23/21 hospitalized  06/09/21 d/c home on 06/08/21 post hospital follow up 06/09/21 pending making his appointments as he had to cancel the previous ones Decline assist from RN CM  06/05/21 re admission 06/02/21 pending making follow up appointments      [facility]) Track and Manage My Symptoms-COPD (pt-stated)   On track     Timeframe:  Long-Range Goal Priority:  High Start Date:            06/09/21                 Expected End Date:        08/27/21               Follow Up Date 07/19/21 Barriers: Hearing Knowledge     - arrange respite care for caregiver - eliminate symptom triggers at home - follow rescue plan if symptoms flare-up - keep follow-up appointments - use an extra pillow to sleep   Notes:  07/13/21 continues with shortness of breath especially at night, outreached to pulmonology per Action plan resulting in medicine changes, to see cardiology 07/16/21, Has step daughter caring for his wife, taking naps  06/30/21 unable to speak at time of [facility] outreach 06/23/21 hospitalized  06/09/21 doing better, monitoring for increase symptoms, taking medications, plans to return to see pulmonologist      Track and Manage Symptoms-Heart Failure [facility]) (pt-stated)   On track     Timeframe:  Long-Range Goal Priority:  High Start Date:                 07/13/21            Expected End Date:       09/26/21                Follow Up Date 07/19/21    -  develop a rescue plan - know when to call the doctor - track symptoms and what helps feel better or worse     Notes:  07/13/21 after review of CHF and home interventions, will attempt to weigh more. Voiced understanding of CHF action plan, when to call MD, states sleeping up helps him to feel better, to see cardiology on 07/16/21         Joelene Millin L. Lavina Hamman, RN,  BSN, [place] Coordinator Office number [phone] [facility] number [phone] Fax number [phone]

## 2021-07-16 ENCOUNTER — Encounter: Payer: Self-pay | Admitting: Cardiovascular Disease

## 2021-07-16 ENCOUNTER — Ambulatory Visit (INDEPENDENT_AMBULATORY_CARE_PROVIDER_SITE_OTHER): Payer: Medicare Other | Admitting: Cardiovascular Disease

## 2021-07-16 ENCOUNTER — Other Ambulatory Visit: Payer: Self-pay

## 2021-07-16 VITALS — BP 82/40 | HR 62 | Ht 66.5 in | Wt 157.2 lb

## 2021-07-16 DIAGNOSIS — Z951 Presence of aortocoronary bypass graft: Secondary | ICD-10-CM

## 2021-07-16 DIAGNOSIS — J449 Chronic obstructive pulmonary disease, unspecified: Secondary | ICD-10-CM | POA: Diagnosis not present

## 2021-07-16 DIAGNOSIS — I2510 Atherosclerotic heart disease of native coronary artery without angina pectoris: Secondary | ICD-10-CM | POA: Diagnosis not present

## 2021-07-16 NOTE — Patient Instructions (Signed)
Medication Instructions:  Your physician recommends that you continue on your current medications as directed. Please refer to the Current Medication list given to you today.  *If you need a refill on your cardiac medications before your next appointment, please call your pharmacy*   Lab Work: NONE   If you have labs (blood work) drawn today and your tests are completely normal, you will receive your results only by: . MyChart Message (if you have MyChart) OR . A paper copy in the mail If you have any lab test that is abnormal or we need to change your treatment, we will call you to review the results.   Testing/Procedures: NONE    Follow-Up: At [facility], you and your health needs are our priority.  As part of our continuing mission to provide you with exceptional heart care, we have created designated Provider Care Teams.  These Care Teams include your primary Cardiologist (physician) and Advanced Practice Providers (APPs -  Physician Assistants and Nurse Practitioners) who all work together to provide you with the care you need, when you need it.  We recommend signing up for the patient portal called "MyChart".  Sign up information is provided on this After Visit Summary.  MyChart is used to connect with patients for Virtual Visits (Telemedicine).  Patients are able to view lab/test results, encounter notes, upcoming appointments, etc.  Non-urgent messages can be sent to your provider as well.   To learn more about what you can do with MyChart, go to [email or web address].    Your next appointment:   1 year(s)  The format for your next appointment:   In Person  Provider:   Peter Nishan, MD   Other Instructions Thank you for choosing [facility]!    

## 2021-07-17 ENCOUNTER — Inpatient Hospital Stay (HOSPITAL_COMMUNITY): Payer: Medicare Other

## 2021-07-17 ENCOUNTER — Emergency Department (HOSPITAL_COMMUNITY): Payer: Medicare Other

## 2021-07-17 ENCOUNTER — Inpatient Hospital Stay (HOSPITAL_COMMUNITY)
Admission: EM | Admit: 2021-07-17 | Discharge: 2021-07-21 | DRG: 208 | Disposition: A | Discharge status: Home Health | Payer: Medicare Other | Attending: Internal Medicine | Admitting: Internal Medicine

## 2021-07-17 DIAGNOSIS — Z79899 Other long term (current) drug therapy: Secondary | ICD-10-CM

## 2021-07-17 DIAGNOSIS — E782 Mixed hyperlipidemia: Secondary | ICD-10-CM | POA: Diagnosis not present

## 2021-07-17 DIAGNOSIS — Z825 Family history of asthma and other chronic lower respiratory diseases: Secondary | ICD-10-CM | POA: Diagnosis not present

## 2021-07-17 DIAGNOSIS — E7800 Pure hypercholesterolemia, unspecified: Secondary | ICD-10-CM | POA: Diagnosis not present

## 2021-07-17 DIAGNOSIS — J9622 Acute and chronic respiratory failure with hypercapnia: Secondary | ICD-10-CM | POA: Diagnosis not present

## 2021-07-17 DIAGNOSIS — N401 Enlarged prostate with lower urinary tract symptoms: Secondary | ICD-10-CM | POA: Diagnosis present

## 2021-07-17 DIAGNOSIS — E559 Vitamin D deficiency, unspecified: Secondary | ICD-10-CM | POA: Diagnosis not present

## 2021-07-17 DIAGNOSIS — I130 Hypertensive heart and chronic kidney disease with heart failure and stage 1 through stage 4 chronic kidney disease, or unspecified chronic kidney disease: Secondary | ICD-10-CM | POA: Diagnosis present

## 2021-07-17 DIAGNOSIS — G9341 Metabolic encephalopathy: Secondary | ICD-10-CM | POA: Diagnosis not present

## 2021-07-17 DIAGNOSIS — R54 Age-related physical debility: Secondary | ICD-10-CM | POA: Diagnosis present

## 2021-07-17 DIAGNOSIS — N1831 Chronic kidney disease, stage 3a: Secondary | ICD-10-CM | POA: Diagnosis not present

## 2021-07-17 DIAGNOSIS — Z7902 Long term (current) use of antithrombotics/antiplatelets: Secondary | ICD-10-CM

## 2021-07-17 DIAGNOSIS — I2510 Atherosclerotic heart disease of native coronary artery without angina pectoris: Secondary | ICD-10-CM | POA: Diagnosis present

## 2021-07-17 DIAGNOSIS — I482 Chronic atrial fibrillation, unspecified: Secondary | ICD-10-CM | POA: Diagnosis present

## 2021-07-17 DIAGNOSIS — Z7951 Long term (current) use of inhaled steroids: Secondary | ICD-10-CM | POA: Diagnosis not present

## 2021-07-17 DIAGNOSIS — Z20822 Contact with and (suspected) exposure to covid-19: Secondary | ICD-10-CM | POA: Diagnosis present

## 2021-07-17 DIAGNOSIS — Z8249 Family history of ischemic heart disease and other diseases of the circulatory system: Secondary | ICD-10-CM | POA: Diagnosis not present

## 2021-07-17 DIAGNOSIS — H9190 Unspecified hearing loss, unspecified ear: Secondary | ICD-10-CM | POA: Diagnosis present

## 2021-07-17 DIAGNOSIS — I5033 Acute on chronic diastolic (congestive) heart failure: Secondary | ICD-10-CM | POA: Diagnosis present

## 2021-07-17 DIAGNOSIS — Z4659 Encounter for fitting and adjustment of other gastrointestinal appliance and device: Secondary | ICD-10-CM

## 2021-07-17 DIAGNOSIS — F17210 Nicotine dependence, cigarettes, uncomplicated: Secondary | ICD-10-CM | POA: Diagnosis present

## 2021-07-17 DIAGNOSIS — J441 Chronic obstructive pulmonary disease with (acute) exacerbation: Principal | ICD-10-CM | POA: Diagnosis present

## 2021-07-17 DIAGNOSIS — I5032 Chronic diastolic (congestive) heart failure: Secondary | ICD-10-CM | POA: Diagnosis not present

## 2021-07-17 DIAGNOSIS — J9602 Acute respiratory failure with hypercapnia: Secondary | ICD-10-CM | POA: Diagnosis present

## 2021-07-17 DIAGNOSIS — I700 Atherosclerosis of aorta: Secondary | ICD-10-CM | POA: Diagnosis not present

## 2021-07-17 DIAGNOSIS — Z01818 Encounter for other preprocedural examination: Secondary | ICD-10-CM

## 2021-07-17 DIAGNOSIS — Z8616 Personal history of COVID-19: Secondary | ICD-10-CM

## 2021-07-17 DIAGNOSIS — J45909 Unspecified asthma, uncomplicated: Secondary | ICD-10-CM | POA: Diagnosis not present

## 2021-07-17 DIAGNOSIS — J9621 Acute and chronic respiratory failure with hypoxia: Secondary | ICD-10-CM | POA: Diagnosis present

## 2021-07-17 DIAGNOSIS — I480 Paroxysmal atrial fibrillation: Secondary | ICD-10-CM | POA: Diagnosis not present

## 2021-07-17 DIAGNOSIS — Z4682 Encounter for fitting and adjustment of non-vascular catheter: Secondary | ICD-10-CM | POA: Diagnosis not present

## 2021-07-17 DIAGNOSIS — Z7189 Other specified counseling: Secondary | ICD-10-CM | POA: Diagnosis not present

## 2021-07-17 DIAGNOSIS — J9601 Acute respiratory failure with hypoxia: Secondary | ICD-10-CM | POA: Diagnosis not present

## 2021-07-17 DIAGNOSIS — Z951 Presence of aortocoronary bypass graft: Secondary | ICD-10-CM

## 2021-07-17 DIAGNOSIS — E778 Other disorders of glycoprotein metabolism: Secondary | ICD-10-CM | POA: Diagnosis present

## 2021-07-17 DIAGNOSIS — Z888 Allergy status to other drugs, medicaments and biological substances status: Secondary | ICD-10-CM

## 2021-07-17 DIAGNOSIS — Z9889 Other specified postprocedural states: Secondary | ICD-10-CM | POA: Diagnosis not present

## 2021-07-17 DIAGNOSIS — I10 Essential (primary) hypertension: Secondary | ICD-10-CM | POA: Diagnosis not present

## 2021-07-17 DIAGNOSIS — R0602 Shortness of breath: Secondary | ICD-10-CM | POA: Diagnosis present

## 2021-07-17 DIAGNOSIS — Z832 Family history of diseases of the blood and blood-forming organs and certain disorders involving the immune mechanism: Secondary | ICD-10-CM | POA: Diagnosis not present

## 2021-07-17 DIAGNOSIS — Z978 Presence of other specified devices: Secondary | ICD-10-CM

## 2021-07-17 DIAGNOSIS — Z7952 Long term (current) use of systemic steroids: Secondary | ICD-10-CM

## 2021-07-17 DIAGNOSIS — Z955 Presence of coronary angioplasty implant and graft: Secondary | ICD-10-CM

## 2021-07-17 DIAGNOSIS — Z515 Encounter for palliative care: Secondary | ICD-10-CM | POA: Diagnosis not present

## 2021-07-17 DIAGNOSIS — J449 Chronic obstructive pulmonary disease, unspecified: Secondary | ICD-10-CM | POA: Diagnosis not present

## 2021-07-17 DIAGNOSIS — Z9981 Dependence on supplemental oxygen: Secondary | ICD-10-CM

## 2021-07-17 LAB — BLOOD GAS, ARTERIAL
Acid-Base Excess: 11.3 mmol/L — ABNORMAL HIGH (ref 0.0–2.0)
Acid-Base Excess: 9.8 mmol/L — ABNORMAL HIGH (ref 0.0–2.0)
Bicarbonate: 31.9 mmol/L — ABNORMAL HIGH (ref 20.0–28.0)
Bicarbonate: 32.7 mmol/L — ABNORMAL HIGH (ref 20.0–28.0)
FIO2: 100
FIO2: 60
O2 Saturation: 97.2 %
O2 Saturation: 99.9 %
Patient temperature: 37
Patient temperature: 36.3
pCO2 arterial: 101 mmHg (ref 32.0–48.0)
pCO2 arterial: 56.8 mmHg — ABNORMAL HIGH (ref 32.0–48.0)
pH, Arterial: 7.212 — ABNORMAL LOW (ref 7.350–7.450)
pH, Arterial: 7.402 (ref 7.350–7.450)
pO2, Arterial: 104 mmHg (ref 83.0–108.0)
pO2, Arterial: 213 mmHg — ABNORMAL HIGH (ref 83.0–108.0)

## 2021-07-17 LAB — COMPREHENSIVE METABOLIC PANEL
ALT: 16 U/L (ref 0–44)
AST: 16 U/L (ref 15–41)
Albumin: 3.5 g/dL (ref 3.5–5.0)
Alkaline Phosphatase: 56 U/L (ref 38–126)
Anion gap: 5 (ref 5–15)
BUN: 21 mg/dL (ref 8–23)
CO2: 37 mmol/L — ABNORMAL HIGH (ref 22–32)
Calcium: 9 mg/dL (ref 8.9–10.3)
Chloride: 99 mmol/L (ref 98–111)
Creatinine, Ser: 1.21 mg/dL (ref 0.61–1.24)
GFR, Estimated: >60 mL/min (ref >60)
Glucose, Bld: 102 mg/dL — ABNORMAL HIGH (ref 70–99)
Potassium: 3.9 mmol/L (ref 3.5–5.1)
Sodium: 141 mmol/L (ref 135–145)
Total Bilirubin: 0.8 mg/dL (ref 0.3–1.2)
Total Protein: 6.6 g/dL (ref 6.5–8.1)

## 2021-07-17 LAB — URINALYSIS, ROUTINE W REFLEX MICROSCOPIC
Bacteria, UA: NONE SEEN
Bilirubin Urine: NEGATIVE
Glucose, UA: NEGATIVE mg/dL
Hgb urine dipstick: NEGATIVE
Ketones, ur: NEGATIVE mg/dL
Leukocytes,Ua: NEGATIVE
Nitrite: NEGATIVE
Protein, ur: 100 mg/dL — AB
Specific Gravity, Urine: 1.015 (ref 1.005–1.030)
pH: 5 (ref 5.0–8.0)

## 2021-07-17 LAB — CBC WITH DIFFERENTIAL/PLATELET
Abs Immature Granulocytes: 0.11 10*3/uL — ABNORMAL HIGH (ref 0.00–0.07)
Basophils Absolute: 0.1 10*3/uL (ref 0.0–0.1)
Basophils Relative: 1 %
Eosinophils Absolute: 0.2 10*3/uL (ref 0.0–0.5)
Eosinophils Relative: 2 %
HCT: 39.4 % (ref 39.0–52.0)
Hemoglobin: 12 g/dL — ABNORMAL LOW (ref 13.0–17.0)
Immature Granulocytes: 1 %
Lymphocytes Relative: 19 %
Lymphs Abs: 1.9 10*3/uL (ref 0.7–4.0)
MCH: 31.4 pg (ref 26.0–34.0)
MCHC: 30.5 g/dL (ref 30.0–36.0)
MCV: 103.1 fL — ABNORMAL HIGH (ref 80.0–100.0)
Monocytes Absolute: 0.7 10*3/uL (ref 0.1–1.0)
Monocytes Relative: 8 %
Neutro Abs: 7 10*3/uL (ref 1.7–7.7)
Neutrophils Relative %: 69 %
Platelets: 194 10*3/uL (ref 150–400)
RBC: 3.82 MIL/uL — ABNORMAL LOW (ref 4.22–5.81)
RDW: 14.6 % (ref 11.5–15.5)
WBC: 9.9 10*3/uL (ref 4.0–10.5)
nRBC: 0 % (ref 0.0–0.2)

## 2021-07-17 LAB — BLOOD GAS, VENOUS
Acid-Base Excess: 11.1 mmol/L — ABNORMAL HIGH (ref 0.0–2.0)
Acid-Base Excess: 12.4 mmol/L — ABNORMAL HIGH (ref 0.0–2.0)
Bicarbonate: 31.3 mmol/L — ABNORMAL HIGH (ref 20.0–28.0)
Bicarbonate: 33 mmol/L — ABNORMAL HIGH (ref 20.0–28.0)
FIO2: 36
FIO2: 40
O2 Saturation: 95.4 %
O2 Saturation: 73 %
Patient temperature: 36.5
Patient temperature: 36.5
pCO2, Ven: 107 mmHg (ref 44.0–60.0)
pCO2, Ven: 88.1 mmHg (ref 44.0–60.0)
pH, Ven: 7.188 — CL (ref 7.250–7.430)
pH, Ven: 7.272 (ref 7.250–7.430)
pO2, Ven: 66.1 mmHg — ABNORMAL HIGH (ref 32.0–45.0)
pO2, Ven: 44.9 mmHg (ref 32.0–45.0)

## 2021-07-17 LAB — TROPONIN I (HIGH SENSITIVITY)
Troponin I (High Sensitivity): 11 ng/L (ref <18)
Troponin I (High Sensitivity): 10 ng/L (ref <18)

## 2021-07-17 LAB — BRAIN NATRIURETIC PEPTIDE: B Natriuretic Peptide: 638 pg/mL — ABNORMAL HIGH (ref 0.0–100.0)

## 2021-07-17 MED ORDER — SODIUM CHLORIDE 0.9 % IV BOLUS
500.0000 mL | Freq: Once | INTRAVENOUS | Status: AC
  Administered 2021-07-17: 500 mL via INTRAVENOUS

## 2021-07-17 MED ORDER — IPRATROPIUM-ALBUTEROL 0.5-2.5 (3) MG/3ML IN SOLN: 10.0000 mL | Freq: Once | RESPIRATORY_TRACT | Status: DC

## 2021-07-17 MED ORDER — ETOMIDATE 2 MG/ML IV SOLN
20.0000 mg | Freq: Once | INTRAVENOUS | Status: AC
  Administered 2021-07-17: 20 mg via INTRAVENOUS

## 2021-07-17 MED ORDER — SUCCINYLCHOLINE CHLORIDE 200 MG/10ML IV SOSY
100.0000 mg | PREFILLED_SYRINGE | Freq: Once | INTRAVENOUS | Status: AC
  Administered 2021-07-17: 100 mg via INTRAVENOUS

## 2021-07-17 MED ORDER — IPRATROPIUM-ALBUTEROL 0.5-2.5 (3) MG/3ML IN SOLN
3.0000 mL | Freq: Once | RESPIRATORY_TRACT | Status: AC
  Administered 2021-07-17: 3 mL via RESPIRATORY_TRACT
  Filled 2021-07-17: qty 3

## 2021-07-17 MED ORDER — PROPOFOL 1000 MG/100ML IV EMUL
0.0000 ug/kg/min | INTRAVENOUS | Status: DC
  Administered 2021-07-17: 5 ug/kg/min via INTRAVENOUS
  Administered 2021-07-17: 10 ug/kg/min via INTRAVENOUS
  Filled 2021-07-17: qty 100

## 2021-07-17 MED ORDER — ALBUTEROL SULFATE (2.5 MG/3ML) 0.083% IN NEBU
INHALATION_SOLUTION | RESPIRATORY_TRACT | Status: AC
  Filled 2021-07-17: qty 12

## 2021-07-17 MED ORDER — IPRATROPIUM-ALBUTEROL 0.5-2.5 (3) MG/3ML IN SOLN
3.0000 mL | Freq: Four times a day (QID) | RESPIRATORY_TRACT | Status: DC
  Administered 2021-07-17 – 2021-07-19 (×6): 3 mL via RESPIRATORY_TRACT
  Filled 2021-07-17 (×6): qty 3

## 2021-07-17 MED ORDER — FUROSEMIDE 10 MG/ML IJ SOLN
80.0000 mg | Freq: Once | INTRAMUSCULAR | Status: AC
  Administered 2021-07-17: 80 mg via INTRAVENOUS
  Filled 2021-07-17: qty 8

## 2021-07-17 MED ORDER — FENTANYL CITRATE PF 50 MCG/ML IJ SOSY: 25.0000 ug | PREFILLED_SYRINGE | INTRAMUSCULAR | Status: DC | PRN

## 2021-07-17 MED ORDER — ALBUTEROL SULFATE (2.5 MG/3ML) 0.083% IN NEBU
2.5000 mg | INHALATION_SOLUTION | Freq: Once | RESPIRATORY_TRACT | Status: AC
  Administered 2021-07-17: 2.5 mg via RESPIRATORY_TRACT
  Filled 2021-07-17: qty 3

## 2021-07-17 MED ORDER — METHYLPREDNISOLONE SODIUM SUCC 125 MG IJ SOLR
80.0000 mg | Freq: Two times a day (BID) | INTRAMUSCULAR | Status: DC
  Administered 2021-07-17 – 2021-07-20 (×7): 80 mg via INTRAVENOUS
  Filled 2021-07-17 (×7): qty 2

## 2021-07-17 MED ORDER — METHYLPREDNISOLONE SODIUM SUCC 125 MG IJ SOLR
125.0000 mg | Freq: Once | INTRAMUSCULAR | Status: AC
  Administered 2021-07-17: 125 mg via INTRAVENOUS
  Filled 2021-07-17: qty 2

## 2021-07-17 MED ORDER — CHLORHEXIDINE GLUCONATE 0.12% ORAL RINSE (MEDLINE KIT)
15.0000 mL | Freq: Two times a day (BID) | OROMUCOSAL | Status: DC
  Administered 2021-07-17 – 2021-07-18 (×2): 15 mL via OROMUCOSAL

## 2021-07-17 MED ORDER — DOCUSATE SODIUM 50 MG/5ML PO LIQD
100.0000 mg | Freq: Two times a day (BID) | ORAL | Status: DC
  Administered 2021-07-17 – 2021-07-18 (×2): 100 mg
  Filled 2021-07-17 (×5): qty 10

## 2021-07-17 MED ORDER — PANTOPRAZOLE SODIUM 40 MG IV SOLR
40.0000 mg | Freq: Every day | INTRAVENOUS | Status: DC
  Administered 2021-07-18: 40 mg via INTRAVENOUS
  Filled 2021-07-17: qty 40

## 2021-07-17 MED ORDER — DEXMEDETOMIDINE HCL IN NACL 400 MCG/100ML IV SOLN
0.4000 ug/kg/h | INTRAVENOUS | Status: DC
  Administered 2021-07-17 – 2021-07-18 (×2): 0.4 ug/kg/h via INTRAVENOUS
  Filled 2021-07-17 (×2): qty 100

## 2021-07-17 MED ORDER — FENTANYL CITRATE PF 50 MCG/ML IJ SOSY
25.0000 ug | PREFILLED_SYRINGE | INTRAMUSCULAR | Status: DC | PRN
  Administered 2021-07-17: 50 ug via INTRAVENOUS
  Filled 2021-07-17: qty 1

## 2021-07-17 MED ORDER — AZITHROMYCIN 500 MG IV SOLR
500.0000 mg | INTRAVENOUS | Status: DC
Start: 2021-07-17 — End: 2021-07-19
  Administered 2021-07-17 – 2021-07-18 (×2): 500 mg via INTRAVENOUS
  Filled 2021-07-17 (×2): qty 500

## 2021-07-17 MED ORDER — ALBUTEROL SULFATE (2.5 MG/3ML) 0.083% IN NEBU: 2.5000 mg | INHALATION_SOLUTION | RESPIRATORY_TRACT | Status: DC | PRN

## 2021-07-17 MED ORDER — HEPARIN SODIUM (PORCINE) 5000 UNIT/ML IJ SOLN
5000.0000 [IU] | Freq: Three times a day (TID) | INTRAMUSCULAR | Status: DC
  Administered 2021-07-17 – 2021-07-21 (×12): 5000 [IU] via SUBCUTANEOUS
  Filled 2021-07-17 (×12): qty 1

## 2021-07-17 MED ORDER — LACTATED RINGERS IV BOLUS
1000.0000 mL | Freq: Once | INTRAVENOUS | Status: AC
  Administered 2021-07-17: 1000 mL via INTRAVENOUS

## 2021-07-17 MED ORDER — POLYETHYLENE GLYCOL 3350 17 G PO PACK
17.0000 g | PACK | Freq: Every day | ORAL | Status: DC
  Administered 2021-07-18: 17 g
  Filled 2021-07-17: qty 1

## 2021-07-17 MED ORDER — ORAL CARE MOUTH RINSE
15.0000 mL | OROMUCOSAL | Status: DC
  Administered 2021-07-17 – 2021-07-18 (×6): 15 mL via OROMUCOSAL

## 2021-07-17 NOTE — ED Provider Notes (Addendum)
[facility] EMERGENCY DEPARTMENT Provider Note   CSN: [id number] Arrival date & time: 07/17/21  1154     History Chief Complaint  Patient presents with   Shortness of Breath    Daniel Barker is a 75 y.o. male with PMH A. fib, CKD, COPD with multiple hospitalizations for COPD exacerbation, HLD, CAD status post CABG who presents the emergency department for evaluation of shortness of breath.  On initial presentation, patient is significantly tachypneic and moving minimal air.  He has faint wheezes and is altered.  He is saturating 100% on 6 L but has significant increased work of breathing and is unable to provide history.   Shortness of Breath     Past Medical History:  Diagnosis Date   Asthma    Atrial fibrillation (HCC)    BPH (benign prostatic hyperplasia)    Cigarette smoker    CKD (chronic kidney disease)    COPD (chronic obstructive pulmonary disease) (HCC)    Coronary artery disease    Cough    High cholesterol    Hx of CABG    Hypercholesteremia    Hypertension    Localized edema    Stented coronary artery     Patient Active Problem List   Diagnosis Date Noted   Chronic kidney disease, stage 3a ([place]) 07/03/2021   Chronic diastolic heart failure ([place]) 07/03/2021   History of fracture 07/03/2021   SOB (shortness of breath) 06/05/2021   Elevated troponin 06/05/2021   Acute on chronic respiratory failure with hypoxia and hypercapnia ([place]) 05/19/2021   COVID-19 virus infection 05/18/2021   Essential tremor 05/04/2021   Chronic atrial fibrillation ([place]) 04/25/2021   Chronic kidney disease 04/25/2021   Asthma 04/25/2021   H/O coronary angioplasty 04/25/2021   Impaired fasting glucose 04/25/2021   Cellulitis 04/25/2021   Edema 04/25/2021   Nicotine dependence 04/25/2021   Overweight 04/25/2021   Prediabetes 04/25/2021   Tremor 04/25/2021   Vitamin D deficiency 04/25/2021   Mixed hyperlipidemia 04/16/2021   Closed displaced spiral fracture of shaft of left  humerus 12/23/2020   Chronic hypoxemic respiratory failure ([place]) 09/02/2020   Urinary retention 07/08/2020   Benign prostatic hyperplasia with lower urinary tract symptoms 07/08/2020   Chronic obstructive lung disease ([place]) 05/28/2020   Cigarette smoker 05/28/2020   Anemia of chronic disease 06/03/2014   Atherosclerosis of coronary artery without angina pectoris 06/03/2014   Acute respiratory failure with hypercapnia ([place]) 06/01/2014   Diabetes mellitus ([place]) 06/01/2014   Low blood pressure 06/01/2014    Past Surgical History:  Procedure Laterality Date   CARDIAC SURGERY     CORONARY ARTERY BYPASS GRAFT         Family History  Problem Relation Age of Onset   Hypertension Mother    Clotting disorder Mother    COPD Father     Social History   Tobacco Use   Smoking status: Former    Packs/day: 1.00    Years: 60.00    Pack years: 60.00    Types: Cigarettes    Quit date: 09/01/2020    Years since quitting: 0.8   Smokeless tobacco: Never  Vaping Use   Vaping Use: Never used  Substance Use Topics   Alcohol use: Never   Drug use: Never    Home Medications Prior to Admission medications   Medication Sig Start Date End Date Taking? Authorizing Provider  acetaminophen (TYLENOL) 500 MG tablet Take 1,000 mg by mouth every 6 (six) hours as needed.    [provider]  albuterol (PROVENTIL) (2.5 MG/3ML) 0.083% nebulizer solution Take 3 mLs (2.5 mg total) by nebulization every 6 (six) hours as needed for wheezing or shortness of breath. 07/05/21   Tanda Rockers, MD  albuterol (VENTOLIN HFA) 108 (90 Base) MCG/ACT inhaler INHALE 2 PUFFS BY MOUTH EVERY 4 HOURS AS NEEDED 07/05/21   Tanda Rockers, MD  ascorbic acid (VITAMIN C) 500 MG tablet Take 1 tablet (500 mg total) by mouth daily. 05/24/21   Barton Dubois, MD  bisoprolol (ZEBETA) 5 MG tablet Take 1 tablet (5 mg total) by mouth 2 (two) times daily. 07/12/21   Tanda Rockers, MD  budesonide-formoterol (SYMBICORT) 160-4.5  MCG/ACT inhaler Take 2 puffs first thing in am and then another 2 puffs about 12 hours later. 10/27/20   Tanda Rockers, MD  clopidogrel (PLAVIX) 75 MG tablet Take 75 mg by mouth daily.    [provider]  fenofibrate micronized (LOFIBRA) 134 MG capsule Take 134 mg by mouth daily before breakfast.    [provider]  guaiFENesin-dextromethorphan (ROBITUSSIN DM) 100-10 MG/5ML syrup Take 5 mLs by mouth every 4 (four) hours as needed for cough. 05/23/21   Barton Dubois, MD  methylPREDNISolone (MEDROL DOSEPAK) 4 MG TBPK tablet Medrol Dosepak take as instructed 06/23/21   Shahmehdi, Erling Conte A, MD  montelukast (SINGULAIR) 10 MG tablet Take 10 mg by mouth at bedtime.    [provider]  pantoprazole (PROTONIX) 40 MG tablet Take 1 tablet (40 mg total) by mouth 2 (two) times daily. 05/23/21   Barton Dubois, MD  predniSONE (DELTASONE) 50 MG tablet Take 1 tablet (50 mg total) by mouth daily. [id number]   Delora Fuel, MD  propafenone (RYTHMOL) 225 MG tablet Take 1 tablet (225 mg total) by mouth every 8 (eight) hours. 02/03/21   Josue Hector, MD  tamsulosin (FLOMAX) 0.4 MG CAPS capsule TAKE 1 CAPSULE(0.4 MG) BY MOUTH DAILY 06/30/21   McKenzie, Candee Furbish, MD  torsemide (DEMADEX) 20 MG tablet Take 1 tablet (20 mg total) by mouth daily. 06/23/21 07/23/21  Deatra James, MD  varenicline (CHANTIX) 0.5 MG tablet Take 0.5 mg by mouth 2 (two) times daily. 04/08/21   [provider]  zinc sulfate 220 (50 Zn) MG capsule Take 1 capsule (220 mg total) by mouth daily. 05/24/21   Barton Dubois, MD    Allergies    Lipitor [atorvastatin]  Review of Systems   Review of Systems  Unable to perform ROS: Severe respiratory distress  Respiratory:  Positive for shortness of breath.    Physical Exam Updated Vital Signs BP 101/64 (BP Location: Right Arm)   Pulse (!) 52   Temp 97.7 F (36.5 C) (Oral)   Resp 18   SpO2 98%   Physical Exam Vitals and nursing note reviewed.  Constitutional:       General: He is in acute distress.     Appearance: He is well-developed. He is ill-appearing.  HENT:     Head: Normocephalic and atraumatic.  Eyes:     Conjunctiva/sclera: Conjunctivae normal.  Cardiovascular:     Rate and Rhythm: Normal rate and regular rhythm.     Heart sounds: No murmur heard. Pulmonary:     Effort: Tachypnea, accessory muscle usage and respiratory distress present.     Breath sounds: Wheezing present.  Abdominal:     Palpations: Abdomen is soft.     Tenderness: There is no abdominal tenderness.  Musculoskeletal:     Cervical back: Neck supple.  Skin:    General: Skin is warm and dry.  Neurological:     Mental Status: He is alert.    ED Results / Procedures / Treatments   Labs (all labs ordered are listed, but only abnormal results are displayed) Labs Reviewed  CBC WITH DIFFERENTIAL/PLATELET - Abnormal; Notable for the following components:      Result Value   RBC 3.82 (*)    Hemoglobin 12.0 (*)    MCV 103.1 (*)    Abs Immature Granulocytes 0.11 (*)    All other components within normal limits  COMPREHENSIVE METABOLIC PANEL - Abnormal; Notable for the following components:   CO2 37 (*)    Glucose, Bld 102 (*)    All other components within normal limits  BRAIN NATRIURETIC PEPTIDE - Abnormal; Notable for the following components:   B Natriuretic Peptide 638.0 (*)    All other components within normal limits  BLOOD GAS, VENOUS - Abnormal; Notable for the following components:   pH, Ven 7.188 (*)    pCO2, Ven 107 (*)    pO2, Ven 66.1 (*)    Bicarbonate 31.3 (*)    Acid-Base Excess 11.1 (*)    All other components within normal limits  TROPONIN I (HIGH SENSITIVITY)    EKG EKG Interpretation  Date/Time:  Saturday July 17 2021 12:19:41 EDT Ventricular Rate:  64 PR Interval:  123 QRS Duration: 106 QT Interval:  460 QTC Calculation: 475 R Axis:   58 Text Interpretation: Sinus or ectopic atrial rhythm Anteroseptal infarct, old Nonspecific  T abnormalities, lateral leads Baseline wander in lead(s) V5 Confirmed by [place] ([id number]) on 07/17/2021 1:16:22 PM  Radiology DG Chest Portable 1 View  Result Date: 07/17/2021 CLINICAL DATA:  Dyspnea. EXAM: PORTABLE CHEST 1 VIEW COMPARISON:  07/02/2021 FINDINGS: Heart size is normal. There is linear atelectasis or scarring at the LEFT lung base, stable in appearance. No new consolidations or pulmonary edema. IMPRESSION: Stable LEFT LOWER lobe atelectasis or scarring. Electronically Signed   By: Nolon Nations M.D.   On: 07/17/2021 12:40    Procedures Procedures   Medications Ordered in ED Medications  albuterol (PROVENTIL) (2.5 MG/3ML) 0.083% nebulizer solution (has no administration in time range)  ipratropium-albuterol (DUONEB) 0.5-2.5 (3) MG/3ML nebulizer solution 3 mL (3 mLs Nebulization Given 07/17/21 1235)  methylPREDNISolone sodium succinate (SOLU-MEDROL) 125 mg/2 mL injection 125 mg (125 mg Intravenous Given 07/17/21 1233)  ipratropium-albuterol (DUONEB) 0.5-2.5 (3) MG/3ML nebulizer solution 10 mL (10 mLs Nebulization Given 07/17/21 1312)    ED Course  I have reviewed the triage vital signs and the nursing notes.  Pertinent labs & imaging results that were available during my care of the patient were reviewed by me and considered in my medical decision making (see chart for details).    MDM Rules/Calculators/A&P                          Patient seen in the emergency department for evaluation of shortness of breath.  Patient arrives satting 76% on 2 L.  Patient increased to 6 L and satting 100%.  Physical exam reveals a patient in severe respiratory distress, tachypneic taking shallow breath sounds moving minimal air.  Initial blood gas with a pH of 7.18 with a PCO2 of 107.  Patient immediately started on BiPAP after chest x-ray confirms no pneumothorax.  BNP elevated to 638, high-sensitivity troponin unremarkable..  Patient given duo nebs through the BiPAP and 125 mg  methylprednisolone given.  On reevaluation, patient's mental status has improved significantly and repeat gas with a PCO2 of 88.  Patient will require admission to a stepdown bed for management of improving COPD exacerbation.  CRITICAL CARE Performed by: Teressa Lower   Total critical care time: 30 minutes  Critical care time was exclusive of separately billable procedures and treating other patients.  Critical care was necessary to treat or prevent imminent or life-threatening deterioration.  Critical care was time spent personally by me on the following activities: development of treatment plan with patient and/or surrogate as well as nursing, discussions with consultants, evaluation of patient's response to treatment, examination of patient, obtaining history from patient or surrogate, ordering and performing treatments and interventions, ordering and review of laboratory studies, ordering and review of radiographic studies, pulse oximetry and re-evaluation of patient's condition.    Final Clinical Impression(s) / ED Diagnoses Final diagnoses:  None    Rx / DC Orders ED Discharge Orders     None        Arelyn Gauer, MD 07/17/21 [place], [place], MD 07/17/21 [place]    Kaeleigh Westendorf, [place], MD 07/17/21 1408

## 2021-07-17 NOTE — ED Provider Notes (Signed)
Patient was admitted on BiPAP.  But got significant CO2 retention.  Had a spell of some bradycardia which was sinus bradycardia.  Arterial blood gas showed that his POC O2 sat gone up to 101.  Contacted by the admitting hospitalist for intubation.  Which we did with me supervising the physician assistant will Ileene Patrick.  Went very smoothly see his note.  No complicating factors.  Patient was given etomidate 20 mg and 100 mg of succinylcholine.  Patient was taken off of BiPAP once we were ready and immediately intubated without any difficulties.  I provided a substantive portion of the care of this patient.  I personally performed the entirety of the medical decision making for this encounter.  EKG Interpretation  Date/Time:  Saturday July 17 2021 12:19:41 EDT Ventricular Rate:  64 PR Interval:  123 QRS Duration: 106 QT Interval:  460 QTC Calculation: 475 R Axis:   58 Text Interpretation: Sinus or ectopic atrial rhythm Anteroseptal infarct, old Nonspecific T abnormalities, lateral leads Baseline wander in lead(s) V5 Confirmed by [place], [place] ([id number]) on 07/17/2021 1:16:22 PM    Fredia Sorrow, MD 07/17/21 1930

## 2021-07-17 NOTE — ED Notes (Signed)
Patient brady down to 43. Dr Matilde Sprang and Dr Bonner Puna aware. Dr Bonner Puna to assess patient.

## 2021-07-17 NOTE — ED Notes (Signed)
Date and time results received: 07/17/21 @ 1246  Test: Blood Gas Critical Value: PH 7.188 PCO2 107  Name of Provider Notified: Dr Matilde Sprang Orders Received? Or Actions Taken?: Orders Received - See Orders for details

## 2021-07-17 NOTE — ED Notes (Signed)
MD & PA at bedside to intubate.

## 2021-07-17 NOTE — H&P (Signed)
History and Physical   Daniel Barker [id number] DOB: 1946-11-21 DOA: 07/17/2021  Referring MD/NP/PA: Dr. Tinnie Gens, [place] PCP: [name], MD Outpatient Specialists: Cardiology, Dr. [name]; Pulmonary, Dr. [name]  Patient coming from: Home  Chief Complaint: Respiratory distress  HPI: Daniel Barker is a 75 y.o. male with a history of 2L O2-dependent COPD, ongoing tobacco smoking, atrial fibrillation, BPH, stage IIIa CKD, CAD s/p CABG and stenting among others who presented to the ED 8/20 in respiratory distress. History was limited by encephalopathy related to respiratory distress.   Per daughter-in-law by phone:  Very tired, very weak, can't catch breath, some difficult to understand speech for the past 2 days. Each day getting a little bit worse. They tried to convince him to come to the ED last night but he's pretty fiercely independent. Starting to have urinary incontinence in the past few days. He finally consented to having his granddaughter bring him to the ED today.  ED Course: Hypoxic requiring 6L O2 (baseline 2L) with tachypnea and very diminished breath sounds found to have respiratory acidosis on VBG 7.188/107/66/31/95%. He was treated with duonebs through BiPAP with reported improvement in mentation. CXR without infiltrate. Troponin normal and BNP elevated above all previous values. Intensivist consulted, recommending hospitalist admission as his repeat VBG showed improvement to 7.272/88/44/12/33. Upon assessment, the patient's mentation was worsening and ABG confirmed progressive hypercarbia. The patient was intubated after confirmation of goals of care/code status discussion with his HCPOA.  Review of Systems: Unable to perform due to encephalopathy, and per HPI. All others reviewed and are negative.   Past Medical History:  Diagnosis Date   Asthma    Atrial fibrillation (HCC)    BPH (benign prostatic hyperplasia)    Cigarette smoker    CKD (chronic kidney disease)    COPD  (chronic obstructive pulmonary disease) (HCC)    Coronary artery disease    Cough    High cholesterol    Hx of CABG    Hypercholesteremia    Hypertension    Localized edema    Stented coronary artery    Past Surgical History:  Procedure Laterality Date   CARDIAC SURGERY     CORONARY ARTERY BYPASS GRAFT     - Continues smoking, moved from [place], [place] earlier this year to be with step daughter. No reported EtOH or illicit drugs.   reports that he quit smoking about 10 months ago. His smoking use included cigarettes. He has a 60.00 pack-year smoking history. He has never used smokeless tobacco. He reports that he does not drink alcohol and does not use drugs. Allergies  Allergen Reactions   Lipitor [Atorvastatin]    Family History  Problem Relation Age of Onset   Hypertension Mother    Clotting disorder Mother    COPD Father    - Family history otherwise reviewed and not pertinent. Prior to Admission medications   Medication Sig Start Date End Date Taking? Authorizing Provider  acetaminophen (TYLENOL) 500 MG tablet Take 1,000 mg by mouth every 6 (six) hours as needed.    [provider]  albuterol (PROVENTIL) (2.5 MG/3ML) 0.083% nebulizer solution Take 3 mLs (2.5 mg total) by nebulization every 6 (six) hours as needed for wheezing or shortness of breath. 07/05/21   Tanda Rockers, MD  albuterol (VENTOLIN HFA) 108 (90 Base) MCG/ACT inhaler INHALE 2 PUFFS BY MOUTH EVERY 4 HOURS AS NEEDED 07/05/21   Tanda Rockers, MD  ascorbic acid (VITAMIN C) 500 MG tablet Take 1 tablet (500  mg total) by mouth daily. 05/24/21   Barton Dubois, MD  bisoprolol (ZEBETA) 5 MG tablet Take 1 tablet (5 mg total) by mouth 2 (two) times daily. 07/12/21   Tanda Rockers, MD  budesonide-formoterol (SYMBICORT) 160-4.5 MCG/ACT inhaler Take 2 puffs first thing in am and then another 2 puffs about 12 hours later. 10/27/20   Tanda Rockers, MD  clopidogrel (PLAVIX) 75 MG tablet Take 75 mg by mouth daily.     [provider]  fenofibrate micronized (LOFIBRA) 134 MG capsule Take 134 mg by mouth daily before breakfast.    [provider]  guaiFENesin-dextromethorphan (ROBITUSSIN DM) 100-10 MG/5ML syrup Take 5 mLs by mouth every 4 (four) hours as needed for cough. 05/23/21   Barton Dubois, MD  methylPREDNISolone (MEDROL DOSEPAK) 4 MG TBPK tablet Medrol Dosepak take as instructed 06/23/21   Shahmehdi, Erling Conte A, MD  montelukast (SINGULAIR) 10 MG tablet Take 10 mg by mouth at bedtime.    [provider]  pantoprazole (PROTONIX) 40 MG tablet Take 1 tablet (40 mg total) by mouth 2 (two) times daily. 05/23/21   Barton Dubois, MD  predniSONE (DELTASONE) 50 MG tablet Take 1 tablet (50 mg total) by mouth daily. [id number]   Delora Fuel, MD  propafenone (RYTHMOL) 225 MG tablet Take 1 tablet (225 mg total) by mouth every 8 (eight) hours. 02/03/21   Josue Hector, MD  tamsulosin (FLOMAX) 0.4 MG CAPS capsule TAKE 1 CAPSULE(0.4 MG) BY MOUTH DAILY 06/30/21   McKenzie, Candee Furbish, MD  torsemide (DEMADEX) 20 MG tablet Take 1 tablet (20 mg total) by mouth daily. 06/23/21 07/23/21  Deatra James, MD  varenicline (CHANTIX) 0.5 MG tablet Take 0.5 mg by mouth 2 (two) times daily. 04/08/21   [provider]  zinc sulfate 220 (50 Zn) MG capsule Take 1 capsule (220 mg total) by mouth daily. 05/24/21   Barton Dubois, MD    Physical Exam: Vitals:   07/17/21 1409 07/17/21 1600 07/17/21 1640 07/17/21 1700  BP: 120/65 (!) 137/57 130/68 140/68  Pulse: (!) 54 (!) 52 (!) 48 (!) 59  Resp: '18 20 17 '$ (!) 42  Temp:      TempSrc:      SpO2: 100% 99% 95% (!) 84%   Constitutional: Frail elderly chronically ill-appearing male poorly responsive on BiPAP. Eyes: Lids and conjunctivae normal, PERRL ENMT: Mucous membranes are tacky. Posterior pharynx clear of any exudate or lesions. Poor dentition.  Neck: normal, supple, no masses, no thyromegaly Respiratory: Very diminished breath sounds without wheezes without  accessory muscle use on BiPAP. Cardiovascular: Regular bradycardia with frequent PACs and compensatory pauses, no murmurs, rubs, or gallops. HR rises above 60 reliably when made to be more alert. No carotid bruits. No JVD. 2+ pitting LE edema. Palpable pedal pulses. Abdomen: Normoactive bowel sounds. No tenderness, non-distended, and no masses palpated. No hepatosplenomegaly. GU: No indwelling catheter Musculoskeletal: + clubbing. No cyanosis. Palpable deformity left humerus that doesn't appear overly tender. No other deformities.  Skin: Warm, dry. No rashes, wounds, or ulcers on visualized skin. Median sternotomy scar noted. Neurologic: CN II-XII grossly intact without focal sensory or motor deficits, though not cooperative with full assessment.  Psychiatric: Drowsy and rousable for periods of a few seconds, speaks in 2 word sentences before falling back to sleep. Oriented to hospital and on rechecks he remembers I'm the doctor.  Labs on Admission: I have personally reviewed following labs and imaging studies  CBC: Recent Labs  Lab 07/17/21 1226  WBC  9.9  NEUTROABS 7.0  HGB 12.0*  HCT 39.4  MCV 103.1*  PLT [id number]   Basic Metabolic Panel: Recent Labs  Lab 07/17/21 1226  NA 141  K 3.9  CL 99  CO2 37*  GLUCOSE 102*  BUN 21  CREATININE 1.21  CALCIUM 9.0   GFR: Estimated Creatinine Clearance: 49.2 mL/min (by C-G formula based on SCr of 1.21 mg/dL). Liver Function Tests: Recent Labs  Lab 07/17/21 1226  AST 16  ALT 16  ALKPHOS 56  BILITOT 0.8  PROT 6.6  ALBUMIN 3.5   Urine analysis:    Component Value Date/Time   COLORURINE YELLOW 07/17/2021 [place] 07/17/2021 1614   LABSPEC 1.015 07/17/2021 1614   PHURINE 5.0 07/17/2021 1614   GLUCOSEU NEGATIVE 07/17/2021 1614   [place] 07/17/2021 [place] 07/17/2021 [place] 07/17/2021 1614   PROTEINUR 100 (A) 07/17/2021 1614   NITRITE NEGATIVE 07/17/2021 1614    LEUKOCYTESUR NEGATIVE 07/17/2021 1614    No results found for this or any previous visit (from the past 240 hour(s)).   Radiological Exams on Admission: DG Chest Portable 1 View  Result Date: 07/17/2021 CLINICAL DATA:  Dyspnea. EXAM: PORTABLE CHEST 1 VIEW COMPARISON:  07/02/2021 FINDINGS: Heart size is normal. There is linear atelectasis or scarring at the LEFT lung base, stable in appearance. No new consolidations or pulmonary edema. IMPRESSION: Stable LEFT LOWER lobe atelectasis or scarring. Electronically Signed   By: Nolon Nations M.D.   On: 07/17/2021 12:40   DG Chest Port 1V same Day  Result Date: 07/17/2021 CLINICAL DATA:  Postablation EXAM: PORTABLE CHEST 1 VIEW COMPARISON:  07/17/2021 FINDINGS: Postoperative changes in the mediastinum. An endotracheal tube is present with tip measuring about 1.5 cm above the carina. Enteric tube with tip in the left upper quadrant consistent with location in the upper stomach. Heart size and pulmonary vascularity are normal. No airspace disease or consolidation in the lungs. Linear scarring or atelectasis suggested in the left base. Similar appearance to previous study. No pleural effusions. No pneumothorax. Calcification of the aorta. IMPRESSION: Appliances appear in satisfactory position. Linear scarring or atelectasis in the left lung base similar to prior study. Otherwise no change. Electronically Signed   By: Lucienne Capers M.D.   On: 07/17/2021 17:29    EKG: Independently reviewed. Regular with multifocal p waves without acute ST segment changes.   Assessment/Plan Active Problems:   Acute respiratory failure with hypoxia and hypercarbia (HCC)   COPD exacerbation (HCC)   Acute respiratory failure with hypoxia and hypercarbia on chronic hypoxic respiratory failure due to COPD exacerbation:  - Breath sounds remain diminished. Though VBG was improved, his mentation was worsening, repeat ABG confirmed progressive respiratory failure despite BiPAP  and continuous neb. Decision to intubate was made, intubated by EDP. This occurred after [place] discussions with [place]. She unfortunately has never had any discussion with the patient and he's never declared his wishes in this situation. He would, she believes, desire trial of intubation. She understands his very grim prognosis.   - Empiric lasix. He's on torsemide and has elevated BNP (even from elevated baseline) and significant LE edema despite recent office visit note from cardiology reporting no edema on exam. - Continue steroids, add azithromycin - Palliative care consulted as prognosis is extremely poor at this time and even if he were to rebound clinically, his many readmissions suggest he's nearing end-stage of COPD and continues to smoke.  - Vent orders  placed, ABG in 1 hr. - VAP protocol initiated - CXR in AM  Acute hypercarbic encephalopathy: Waxing and waning.  - Monitor closely. Currently maintaining airway.   PAF, ectopic atrial rhythm with bradycardia:  - Hold beta blocker due to this and COPD exacerbation for now.  - Has declined anticoagulation in the past per EMR.   BPH:  - Continue flomax once able to administer enteral medications.  - Check bladder scan if unable to void. Has history of retention.  HTN, HLD, CAD s/p CABG  History of covid-16 May 2021: No evidence of pneumonia on CXR. No need to test or isolate at this time.   History of left humerus fracture: Appears stable.   DVT prophylaxis: Heparin   Code Status: Full code  Family Communication: Daughter-in-law, Daniel Barker, with whom he pives and who has healthcare power of attorney. Disposition Plan: Uncertain. Prognosis is poor. Patient has a high likelihood of mortality this admission. Consults called: PCCM  Admission status: Inpatient    Patrecia Pour, MD [facility] [email or web address] 07/17/2021, 5:34 PM

## 2021-07-17 NOTE — ED Notes (Signed)
Date and time results received: 07/17/21 '@1406'$   Test: Blood Gas Critical Value: PCO2 88.1  Name of Provider Notified: Dr Matilde Sprang  Orders Received? Or Actions Taken?: No orders given at this time.

## 2021-07-17 NOTE — Progress Notes (Signed)
eLink Physician-Brief Progress Note Patient Name: Daniel Barker DOB: [birth year] MRN: [id number]   Date of Service  07/17/2021  HPI/Events of Note  75 yo male with  2L O2-dependent COPD, ongoing tobacco smoking, atrial fibrillation, BPH, stage IIIa CKD, CAD s/p CABG and stenting among others who presented to the ED 8/20 in respiratory distress. Intubated in ED d/t AECOPD with hypercarbia. Arrives in ICU intubated and ventilated. BP - 64/50 and HR = 59. Bedside nurse contacting hospitalist to change sedation which is currently a Propofol IV infusion. Last LVEF = 60-65%.  eICU Interventions  Plan: Bolus with LR 1 liter IV over 1 hour now.      Intervention Category Major Interventions: Respiratory failure - evaluation and management;Hypotension - evaluation and management  Lysle Dingwall 07/17/2021, 7:37 PM

## 2021-07-17 NOTE — ED Notes (Signed)
Pt intubated. 7.5 ET tube. 26@ the lip.  Positive color changed noted. Breath sounds noted bilaterally

## 2021-07-17 NOTE — ED Notes (Signed)
Patient placed on Bipap. 14/6 50%

## 2021-07-17 NOTE — ED Notes (Signed)
EDP in room to assess patient. Resp paged for Bi-pap.

## 2021-07-17 NOTE — ED Provider Notes (Signed)
Procedure Name: Intubation Date/Time: 07/17/2021 5:03 PM Performed by: Marcello Fennel, PA-C Pre-anesthesia Checklist: Patient identified Oxygen Delivery Method: Simple face mask Preoxygenation: Pre-oxygenation with 100% oxygen Induction Type: IV induction Ventilation: Mask ventilation without difficulty Laryngoscope Size: Glidescope, Mac and 4 Grade View: Grade II Endobronchial tube: Double lumen EBT and 28 Fr Tube size: 7.5 mm Number of attempts: 1 Airway Equipment and Method: Rigid stylet and Video-laryngoscopy Placement Confirmation: ETT inserted through vocal cords under direct vision, CO2 detector and Breath sounds checked- equal and bilateral Secured at: 26 cm Tube secured with: ETT holder Dental Injury: Teeth and Oropharynx as per pre-operative assessment    Patient currently admitted, due to COPD exacerbation was on BiPAP repeat blood gas showed minimal improvement and intubation was requested by admitting team.  Procedure was performed under the supervision of Dr. Rogene Houston, respiratory therapy, nursing staff and all equipment were at hand and ready.  Intubation was successful, patient tolerated procedure well, will obtain imaging for confirm placement.  Imaging reveals tube is in place.      Marcello Fennel, PA-C 07/17/21 1926    Fredia Sorrow, MD 07/21/21 2005

## 2021-07-17 NOTE — ED Notes (Signed)
Date and time results received: 07/17/21 @ 1636  (use smartphrase ".now" to insert current time)  Test: ABG Critical Value: PCO2 101  Name of Provider Notified: Dr Bonner Puna   Orders Received? Or Actions Taken?: Orders Received - See Orders for details

## 2021-07-18 ENCOUNTER — Inpatient Hospital Stay (HOSPITAL_COMMUNITY): Payer: Medicare Other

## 2021-07-18 DIAGNOSIS — G9341 Metabolic encephalopathy: Secondary | ICD-10-CM

## 2021-07-18 DIAGNOSIS — J9601 Acute respiratory failure with hypoxia: Secondary | ICD-10-CM | POA: Diagnosis not present

## 2021-07-18 DIAGNOSIS — N401 Enlarged prostate with lower urinary tract symptoms: Secondary | ICD-10-CM | POA: Diagnosis not present

## 2021-07-18 DIAGNOSIS — E778 Other disorders of glycoprotein metabolism: Secondary | ICD-10-CM | POA: Diagnosis present

## 2021-07-18 DIAGNOSIS — I482 Chronic atrial fibrillation, unspecified: Secondary | ICD-10-CM

## 2021-07-18 HISTORY — DX: Metabolic encephalopathy: G93.41

## 2021-07-18 LAB — CBC
HCT: 34.1 % — ABNORMAL LOW (ref 39.0–52.0)
Hemoglobin: 10.8 g/dL — ABNORMAL LOW (ref 13.0–17.0)
MCH: 31.9 pg (ref 26.0–34.0)
MCHC: 31.7 g/dL (ref 30.0–36.0)
MCV: 100.6 fL — ABNORMAL HIGH (ref 80.0–100.0)
Platelets: 187 10*3/uL (ref 150–400)
RBC: 3.39 MIL/uL — ABNORMAL LOW (ref 4.22–5.81)
RDW: 14.4 % (ref 11.5–15.5)
WBC: 7.6 10*3/uL (ref 4.0–10.5)
nRBC: 0 % (ref 0.0–0.2)

## 2021-07-18 LAB — BLOOD GAS, ARTERIAL
Acid-Base Excess: 11.2 mmol/L — ABNORMAL HIGH (ref 0.0–2.0)
Bicarbonate: 34.5 mmol/L — ABNORMAL HIGH (ref 20.0–28.0)
FIO2: 50
O2 Saturation: 99 %
Patient temperature: 37.6
pCO2 arterial: 49.3 mmHg — ABNORMAL HIGH (ref 32.0–48.0)
pH, Arterial: 7.472 — ABNORMAL HIGH (ref 7.350–7.450)
pO2, Arterial: 139 mmHg — ABNORMAL HIGH (ref 83.0–108.0)

## 2021-07-18 LAB — MRSA NEXT GEN BY PCR, NASAL: MRSA by PCR Next Gen: NOT DETECTED

## 2021-07-18 LAB — BASIC METABOLIC PANEL
Anion gap: 13 (ref 5–15)
BUN: 26 mg/dL — ABNORMAL HIGH (ref 8–23)
CO2: 33 mmol/L — ABNORMAL HIGH (ref 22–32)
Calcium: 8.8 mg/dL — ABNORMAL LOW (ref 8.9–10.3)
Chloride: 97 mmol/L — ABNORMAL LOW (ref 98–111)
Creatinine, Ser: 1.34 mg/dL — ABNORMAL HIGH (ref 0.61–1.24)
GFR, Estimated: 56 mL/min — ABNORMAL LOW (ref >60)
Glucose, Bld: 164 mg/dL — ABNORMAL HIGH (ref 70–99)
Potassium: 4.1 mmol/L (ref 3.5–5.1)
Sodium: 143 mmol/L (ref 135–145)

## 2021-07-18 LAB — PHOSPHORUS: Phosphorus: 3.1 mg/dL (ref 2.5–4.6)

## 2021-07-18 LAB — MAGNESIUM: Magnesium: 1.7 mg/dL (ref 1.7–2.4)

## 2021-07-18 LAB — TRIGLYCERIDES: Triglycerides: 143 mg/dL (ref <150)

## 2021-07-18 MED ORDER — TAMSULOSIN HCL 0.4 MG PO CAPS
0.4000 mg | ORAL_CAPSULE | Freq: Every day | ORAL | Status: DC
  Administered 2021-07-18 – 2021-07-20 (×3): 0.4 mg via ORAL
  Filled 2021-07-18 (×3): qty 1

## 2021-07-18 MED ORDER — PROPAFENONE HCL 225 MG PO TABS
225.0000 mg | ORAL_TABLET | Freq: Three times a day (TID) | ORAL | Status: DC
  Administered 2021-07-18 – 2021-07-21 (×9): 225 mg via ORAL
  Filled 2021-07-18 (×15): qty 1

## 2021-07-18 MED ORDER — GUAIFENESIN ER 600 MG PO TB12
1200.0000 mg | ORAL_TABLET | Freq: Two times a day (BID) | ORAL | Status: DC
  Administered 2021-07-18 – 2021-07-21 (×6): 1200 mg via ORAL
  Filled 2021-07-18 (×6): qty 2

## 2021-07-18 MED ORDER — PANTOPRAZOLE SODIUM 40 MG PO TBEC
40.0000 mg | DELAYED_RELEASE_TABLET | Freq: Two times a day (BID) | ORAL | Status: DC
  Administered 2021-07-18 – 2021-07-21 (×6): 40 mg via ORAL
  Filled 2021-07-18 (×6): qty 1

## 2021-07-18 MED ORDER — BUDESONIDE 0.25 MG/2ML IN SUSP
0.2500 mg | Freq: Two times a day (BID) | RESPIRATORY_TRACT | Status: DC
  Administered 2021-07-18 – 2021-07-21 (×6): 0.25 mg via RESPIRATORY_TRACT
  Filled 2021-07-18 (×6): qty 2

## 2021-07-18 MED ORDER — CHLORHEXIDINE GLUCONATE CLOTH 2 % EX PADS
6.0000 | MEDICATED_PAD | Freq: Every day | CUTANEOUS | Status: DC
  Administered 2021-07-18 – 2021-07-21 (×4): 6 via TOPICAL

## 2021-07-18 MED ORDER — CLOPIDOGREL BISULFATE 75 MG PO TABS
75.0000 mg | ORAL_TABLET | Freq: Every day | ORAL | Status: DC
  Administered 2021-07-19 – 2021-07-21 (×3): 75 mg via ORAL
  Filled 2021-07-18 (×3): qty 1

## 2021-07-18 MED ORDER — TORSEMIDE 20 MG PO TABS
20.0000 mg | ORAL_TABLET | Freq: Every day | ORAL | Status: DC
  Administered 2021-07-19 – 2021-07-21 (×3): 20 mg via ORAL
  Filled 2021-07-18 (×3): qty 1

## 2021-07-18 MED ORDER — MONTELUKAST SODIUM 10 MG PO TABS
10.0000 mg | ORAL_TABLET | Freq: Every day | ORAL | Status: DC
  Administered 2021-07-18 – 2021-07-20 (×3): 10 mg via ORAL
  Filled 2021-07-18 (×3): qty 1

## 2021-07-18 NOTE — Progress Notes (Signed)
PROGRESS NOTE    Mark Hassey  [id number] DOB: Jul 11, 1946 DOA: 07/17/2021 PCP: Benita Stabile, MD    Brief Narrative:  75 year old male with a history of oxygen dependent COPD, atrial fibrillation, chronic kidney disease stage IIIa, CAD status post CABG, presented in respiratory distress.  He was noted to be encephalopathic due to hypercapnia.  Felt to have COPD exacerbation started on BiPAP.  Did not have significant improvement in blood gas and ultimately required intubation.  After 24 hours, patient noted to have improved blood gas, mental status as well as air movement bilaterally.  He was extubated from ventilator on 8/21.  Palliative care consulted to help address goals of care.   Assessment & Plan:   Active Problems:   Cigarette smoker   Benign prostatic hyperplasia with lower urinary tract symptoms   Chronic atrial fibrillation (HCC)   Chronic kidney disease, stage 3a (HCC)   Chronic diastolic heart failure (HCC)   Acute respiratory failure with hypoxia and hypercarbia (HCC)   COPD exacerbation (HCC)   Acute metabolic encephalopathy   Acute on chronic respiratory failure with hypoxia and hypercarbia -Secondary to COPD exacerbation -Initially presented with diminished breath sounds and worsening mentation -ABG confirmed progressive hypercarbic respiratory failure -He did not have significant improvement BiPAP -Patient was intubated on 8/20 -Following intubation, hypercapnia significantly improved -On 8/21, patient was awake, breathing comfortably on ventilator with good inspiratory volumes and was able to follow commands -Patient was extubated on 8/21  COPD exacerbation -Continue on steroids -Currently on azithromycin -Continue bronchodilators and inhaled steroids  Chronic diastolic congestive heart failure -Patient does have some lower extremity edema, but no pulmonary edema noted on chest imaging -Continue on home dose of torsemide  Acute metabolic  encephalopathy -Secondary to hypercapnia -Mental status is back to baseline  Chronic kidney disease stage III -Creatinine currently at baseline, continue to follow  Paroxysmal atrial fibrillation -Appears to be chronically on Rythmol, will continue -Not on anticoagulation due to patient's choice -Currently on Plavix  BPH -Continue on tamsulosin  Goals of care -Patient has had repeated admissions for COPD exacerbation/respiratory issues -Long-term prognosis appears to be poor -Requested palliative care to help address CODE STATUS and further goals of care.   DVT prophylaxis: heparin injection 5,000 Units Start: 07/17/21 2200  Code Status: Full code Family Communication: updated step daughter Chip Boer who is POA Disposition Plan: Status is: Inpatient  Remains inpatient appropriate because:Ongoing diagnostic testing needed not appropriate for outpatient work up and Inpatient level of care appropriate due to severity of illness  Dispo: The patient is from: Home              Anticipated d/c is to:  TBD              Patient currently is not medically stable to d/c.   Difficult to place patient No         Consultants:  Palliative care  Procedures:  ETT 8/20 > 8/21  Antimicrobials:  Azithromycin 8/20 >   Subjective: Patient seen today on ventilator.  He is awake.  Able to follow commands.  Objective: Vitals:   07/18/21 1415 07/18/21 1430 07/18/21 1445 07/18/21 1500  BP: (!) 109/50     Pulse: 72 70 76 71  Resp: (!) 25 (!) 21 17 20   Temp:      TempSrc:      SpO2: 95% 93% 93% 92%  Weight:        Intake/Output Summary (Last 24 hours) at 07/18/2021 1926 Last  data filed at 07/18/2021 1500 Gross per 24 hour  Intake 414.15 ml  Output 1400 ml  Net -985.85 ml   Filed Weights   07/17/21 1909 07/18/21 0500  Weight: 69.4 kg 69.6 kg    Examination:  General exam: Appears calm and comfortable, intubated on ventilator  Respiratory system: Clear to auscultation.  Respiratory effort normal. Cardiovascular system: S1 & S2 heard, RRR. No JVD, murmurs, rubs, gallops or clicks. 1+ pedal edema. Gastrointestinal system: Abdomen is nondistended, soft and nontender. No organomegaly or masses felt. Normal bowel sounds heard. Central nervous system: Alert and oriented. No focal neurological deficits. Extremities: Symmetric 5 x 5 power. Skin: No rashes, lesions or ulcers Psychiatry: Judgement and insight appear normal. Mood & affect appropriate.     Data Reviewed: I have personally reviewed following labs and imaging studies  CBC: Recent Labs  Lab 07/17/21 1226 07/18/21 0404  WBC 9.9 7.6  NEUTROABS 7.0  --   HGB 12.0* 10.8*  HCT 39.4 34.1*  MCV 103.1* 100.6*  PLT 194 161   Basic Metabolic Panel: Recent Labs  Lab 07/17/21 1226 07/18/21 0404  NA 141 143  K 3.9 4.1  CL 99 97*  CO2 37* 33*  GLUCOSE 102* 164*  BUN 21 26*  CREATININE 1.21 1.34*  CALCIUM 9.0 8.8*  MG  --  1.7  PHOS  --  3.1   GFR: Estimated Creatinine Clearance: 44.5 mL/min (A) (by C-G formula based on SCr of 1.34 mg/dL (H)). Liver Function Tests: Recent Labs  Lab 07/17/21 1226  AST 16  ALT 16  ALKPHOS 56  BILITOT 0.8  PROT 6.6  ALBUMIN 3.5   No results for input(s): LIPASE, AMYLASE in the last 168 hours. No results for input(s): AMMONIA in the last 168 hours. Coagulation Profile: No results for input(s): INR, PROTIME in the last 168 hours. Cardiac Enzymes: No results for input(s): CKTOTAL, CKMB, CKMBINDEX, TROPONINI in the last 168 hours. BNP (last 3 results) No results for input(s): PROBNP in the last 8760 hours. HbA1C: No results for input(s): HGBA1C in the last 72 hours. CBG: No results for input(s): GLUCAP in the last 168 hours. Lipid Profile: Recent Labs    07/18/21 0404  TRIG 143   Thyroid Function Tests: No results for input(s): TSH, T4TOTAL, FREET4, T3FREE, THYROIDAB in the last 72 hours. Anemia Panel: No results for input(s): VITAMINB12,  FOLATE, FERRITIN, TIBC, IRON, RETICCTPCT in the last 72 hours. Sepsis Labs: No results for input(s): PROCALCITON, LATICACIDVEN in the last 168 hours.  Recent Results (from the past 240 hour(s))  MRSA Next Gen by PCR, Nasal     Status: None   Collection Time: 07/17/21 10:28 PM   Specimen: Nasal Mucosa; Nasal Swab  Result Value Ref Range Status   MRSA by PCR Next Gen NOT DETECTED NOT DETECTED Final    Comment: (NOTE) The GeneXpert MRSA Assay (FDA approved for NASAL specimens only), is one component of a comprehensive MRSA colonization surveillance program. It is not intended to diagnose MRSA infection nor to guide or monitor treatment for MRSA infections. Test performance is not FDA approved in patients less than 31 years old. Performed at [facility], [address], [place], [place] [address]          Radiology Studies: Portable Chest xray  Result Date: 07/18/2021 CLINICAL DATA:  Endotracheal tube.  Asthma.  COPD.  Ex-smoker. EXAM: PORTABLE CHEST 1 VIEW COMPARISON:  Yesterday FINDINGS: Endotracheal tube terminates 4.9 cm above carina. Nasogastric tube terminates at the gastric  cardia with the side port just below the gastroesophageal junction. Prior median sternotomy. Midline trachea. Normal heart size. Atherosclerosis in the transverse aorta. No pleural effusion or pneumothorax. Diffuse peribronchial thickening. Mild left base scarring or subsegmental atelectasis laterally. IMPRESSION: Endotracheal tube remains appropriately positioned. No acute cardiopulmonary disease. Peribronchial thickening which may relate to chronic bronchitis or smoking. Electronically Signed   By: Abigail Miyamoto M.D.   On: 07/18/2021 05:32   DG Chest Portable 1 View  Result Date: 07/17/2021 CLINICAL DATA:  Dyspnea. EXAM: PORTABLE CHEST 1 VIEW COMPARISON:  07/02/2021 FINDINGS: Heart size is normal. There is linear atelectasis or scarring at the LEFT lung base, stable in appearance. No new consolidations or  pulmonary edema. IMPRESSION: Stable LEFT LOWER lobe atelectasis or scarring. Electronically Signed   By: Nolon Nations M.D.   On: 07/17/2021 12:40   DG Chest Port 1V same Day  Result Date: 07/17/2021 CLINICAL DATA:  OG tube placement. EXAM: PORTABLE CHEST 1 VIEW COMPARISON:  07/17/2021 FINDINGS: Endotracheal tube is in place, approximately 5 centimeters above the carina. Orogastric tube is in place, tip overlying the level of the stomach, side port in the proximal stomach. Median sternotomy.  Heart size is normal.  Lungs are clear. IMPRESSION: Interval placement of orogastric tube. No evidence for acute cardiopulmonary abnormality. Electronically Signed   By: Nolon Nations M.D.   On: 07/17/2021 18:33   DG Chest Port 1V same Day  Result Date: 07/17/2021 CLINICAL DATA:  Postablation EXAM: PORTABLE CHEST 1 VIEW COMPARISON:  07/17/2021 FINDINGS: Postoperative changes in the mediastinum. An endotracheal tube is present with tip measuring about 1.5 cm above the carina. Enteric tube with tip in the left upper quadrant consistent with location in the upper stomach. Heart size and pulmonary vascularity are normal. No airspace disease or consolidation in the lungs. Linear scarring or atelectasis suggested in the left base. Similar appearance to previous study. No pleural effusions. No pneumothorax. Calcification of the aorta. IMPRESSION: Appliances appear in satisfactory position. Linear scarring or atelectasis in the left lung base similar to prior study. Otherwise no change. Electronically Signed   By: Lucienne Capers M.D.   On: 07/17/2021 17:29        Scheduled Meds:  budesonide (PULMICORT) nebulizer solution  0.25 mg Nebulization BID   chlorhexidine gluconate (MEDLINE KIT)  15 mL Mouth Rinse BID   Chlorhexidine Gluconate Cloth  6 each Topical Daily   clopidogrel  75 mg Oral Daily   docusate  100 mg Per Tube BID   guaiFENesin  1,200 mg Oral BID   heparin  5,000 Units Subcutaneous Q8H    ipratropium-albuterol  10 mL Nebulization Once   ipratropium-albuterol  3 mL Nebulization Q6H   mouth rinse  15 mL Mouth Rinse 10 times per day   methylPREDNISolone (SOLU-MEDROL) injection  80 mg Intravenous Q12H   montelukast  10 mg Oral QHS   pantoprazole  40 mg Oral BID   polyethylene glycol  17 g Per Tube Daily   propafenone  225 mg Oral Q8H   [START ON 07/19/2021] tamsulosin  0.4 mg Oral QPC supper   torsemide  20 mg Oral Daily   Continuous Infusions:  azithromycin 500 mg (07/18/21 1727)     LOS: 1 day    Time spent: 52mins    Kathie Dike, MD [facility]   If 7PM-7AM, please contact night-coverage [email or web address]  07/18/2021, 7:26 PM

## 2021-07-18 NOTE — Procedures (Signed)
Extubation Procedure Note  Patient Details:   Name: Daniel Barker DOB: July 11, 1946 MRN: [id number]   Airway Documentation:    Vent end date: 07/18/21 Vent end time: 1330   Evaluation  O2 sats: stable throughout Complications: No apparent complications Patient did tolerate procedure well. Bilateral Breath Sounds: Clear, Diminished   Yes   Patient extubated without any complications and placed on 2L [place]. All vitals are stable. Patient has adequate cough and able to speak clearly.  Blanchie Serve 07/18/2021, 1:33 PM

## 2021-07-19 ENCOUNTER — Encounter (HOSPITAL_COMMUNITY): Payer: Self-pay | Admitting: Family Medicine

## 2021-07-19 ENCOUNTER — Other Ambulatory Visit: Payer: Self-pay | Admitting: *Deleted

## 2021-07-19 DIAGNOSIS — J9601 Acute respiratory failure with hypoxia: Secondary | ICD-10-CM | POA: Diagnosis not present

## 2021-07-19 DIAGNOSIS — N401 Enlarged prostate with lower urinary tract symptoms: Secondary | ICD-10-CM | POA: Diagnosis not present

## 2021-07-19 DIAGNOSIS — Z7189 Other specified counseling: Secondary | ICD-10-CM | POA: Diagnosis not present

## 2021-07-19 DIAGNOSIS — G9341 Metabolic encephalopathy: Secondary | ICD-10-CM | POA: Diagnosis not present

## 2021-07-19 DIAGNOSIS — I482 Chronic atrial fibrillation, unspecified: Secondary | ICD-10-CM | POA: Diagnosis not present

## 2021-07-19 DIAGNOSIS — Z515 Encounter for palliative care: Secondary | ICD-10-CM | POA: Diagnosis not present

## 2021-07-19 DIAGNOSIS — J9602 Acute respiratory failure with hypercapnia: Secondary | ICD-10-CM | POA: Diagnosis not present

## 2021-07-19 LAB — CBC
HCT: 31.8 % — ABNORMAL LOW (ref 39.0–52.0)
Hemoglobin: 10 g/dL — ABNORMAL LOW (ref 13.0–17.0)
MCH: 31.3 pg (ref 26.0–34.0)
MCHC: 31.4 g/dL (ref 30.0–36.0)
MCV: 99.7 fL (ref 80.0–100.0)
Platelets: 168 10*3/uL (ref 150–400)
RBC: 3.19 MIL/uL — ABNORMAL LOW (ref 4.22–5.81)
RDW: 14.6 % (ref 11.5–15.5)
WBC: 11.6 10*3/uL — ABNORMAL HIGH (ref 4.0–10.5)
nRBC: 0 % (ref 0.0–0.2)

## 2021-07-19 LAB — BASIC METABOLIC PANEL
Anion gap: 8 (ref 5–15)
BUN: 25 mg/dL — ABNORMAL HIGH (ref 8–23)
CO2: 36 mmol/L — ABNORMAL HIGH (ref 22–32)
Calcium: 8.9 mg/dL (ref 8.9–10.3)
Chloride: 98 mmol/L (ref 98–111)
Creatinine, Ser: 1.21 mg/dL (ref 0.61–1.24)
GFR, Estimated: >60 mL/min (ref >60)
Glucose, Bld: 148 mg/dL — ABNORMAL HIGH (ref 70–99)
Potassium: 4.1 mmol/L (ref 3.5–5.1)
Sodium: 142 mmol/L (ref 135–145)

## 2021-07-19 LAB — MAGNESIUM: Magnesium: 1.9 mg/dL (ref 1.7–2.4)

## 2021-07-19 MED ORDER — IPRATROPIUM-ALBUTEROL 0.5-2.5 (3) MG/3ML IN SOLN
3.0000 mL | Freq: Three times a day (TID) | RESPIRATORY_TRACT | Status: DC
  Administered 2021-07-19: 3 mL via RESPIRATORY_TRACT
  Filled 2021-07-19: qty 3

## 2021-07-19 MED ORDER — IPRATROPIUM-ALBUTEROL 0.5-2.5 (3) MG/3ML IN SOLN
3.0000 mL | Freq: Two times a day (BID) | RESPIRATORY_TRACT | Status: DC
  Administered 2021-07-19 – 2021-07-21 (×4): 3 mL via RESPIRATORY_TRACT
  Filled 2021-07-19 (×5): qty 3

## 2021-07-19 MED ORDER — AZITHROMYCIN 250 MG PO TABS
500.0000 mg | ORAL_TABLET | Freq: Every day | ORAL | Status: DC
  Administered 2021-07-19 – 2021-07-20 (×2): 500 mg via ORAL
  Filled 2021-07-19 (×2): qty 2

## 2021-07-19 NOTE — Consult Note (Signed)
Consultation Note Date: 07/19/2021   Patient Name: Daniel Barker  DOB: Dec 24, 1945  MRN: [id number]  Age / Sex: 75 y.o., male  PCP: Daniel Squibb, MD Referring Physician: Kathie Dike, MD  Reason for Consultation: Establishing goals of care  HPI/Patient Profile: 75 y.o. male  with past medical history of O2 dependent COPD with ongoing tobacco smoking, CAD with CABG and stenting, A. fib, HTN/HLD, asthma, CKD 3A, BPH, admitted on 07/17/2021 with acute on chronic respiratory failure with hypoxia and hypercarbia secondary to COPD exacerbation.   Clinical Assessment and Goals of Care: I have reviewed medical records including EPIC notes, labs and imaging, received report from RN, assessed the patient and met to discuss diagnosis prognosis, GOC, EOL wishes, disposition and options.  Daniel Barker is lying quietly in bed.  He greets me making and mostly keeping eye contact.  He appears acutely/chronically ill and somewhat frail.  He is alert and oriented, able to make his basic needs known.  There is no family at bedside at this time.    I introduced Palliative Medicine as specialized medical care for people living with serious illness. It focuses on providing relief from the symptoms and stress of a serious illness. The goal is to improve quality of life for both the patient and the family.  We discussed a brief life review of the patient.  Daniel Barker tells me that he and his wife currently live with his his stepdaughter, Daniel Barker.  He is somewhat independent with IADLs, but has been hospitalized 5 times in the last 6 months.    We then focused on their current illness.  We talked about his respiratory failure, COPD, asthma care.  We talked about the treatment plan.  The natural disease trajectory and expectations at EOL were discussed.  Advanced directives, concepts specific to code status, were considered and  discussed.  Daniel Barker tells me that he remembers being intubated, and although he would not want to go through that again, he would accept life support if it meant he would die without it.  He does set limits, states he would not want tracheostomy.  I encouraged him to share his choices with his healthcare surrogate.  Palliative Care services outpatient were not discussed today, but to be discussed during next visit.  Discussed the importance of continued conversation with family and the medical providers regarding overall plan of care and treatment options, ensuring decisions are within the context of the patient's values and GOCs.  Questions and concerns were addressed.  The family was encouraged to call with questions or concerns.  PMT will continue to support holistically.  Conference with attending, bedside nursing staff, transition of care team related to patient condition, needs, goals of care.  HCPOA   OTHER -Daniel Barker names his stepdaughter, Daniel Barker, as his healthcare surrogate.  He states that his wife is suffering with dementia.    SUMMARY OF RECOMMENDATIONS   At this point full scope/full code Set limits for no trach Unsure if he would except  short-term rehab if qualified Would benefit from outpatient palliative   Code Status/Advance Care Planning: Full code -set limits for no trach  Symptom Management:  Per hospitalist, no additional needs at this time.  Palliative Prophylaxis:  Frequent Pain Assessment and Oral Care  Additional Recommendations (Limitations, Scope, Preferences): Full Scope Treatment  Psycho-social/Spiritual:  Desire for further Chaplaincy support:no Additional Recommendations: Caregiving  Support/Resources and Education on Hospice  Prognosis:  Unable to determine, 3 to 6 months or less would not be surprising based on chronic illness burden, 5 hospital visits in 6 months, decreasing functional status.  Discharge Planning:  To be determined,  based on outcomes/recommendations.       Primary Diagnoses: Present on Admission:  Acute respiratory failure with hypoxia and hypercarbia (HCC)  COPD exacerbation (HCC)  Benign prostatic hyperplasia with lower urinary tract symptoms  Chronic atrial fibrillation (HCC)  Chronic diastolic heart failure (HCC)  Acute metabolic encephalopathy  Chronic kidney disease, stage 3a ([place])  Cigarette smoker   I have reviewed the medical record, interviewed the patient and family, and examined the patient. The following aspects are pertinent.  Past Medical History:  Diagnosis Date   Asthma    Atrial fibrillation (HCC)    BPH (benign prostatic hyperplasia)    Cigarette smoker    CKD (chronic kidney disease)    COPD (chronic obstructive pulmonary disease) (HCC)    Coronary artery disease    Cough    High cholesterol    Hx of CABG    Hypercholesteremia    Hypertension    Localized edema    Stented coronary artery    Social History   Socioeconomic History   Marital status: Married    Spouse name: [name]   Number of children: 3   Years of education: high school   Highest education level: Not on file  Occupational History   Not on file  Tobacco Use   Smoking status: Former    Packs/day: 1.00    Years: 60.00    Pack years: 60.00    Types: Cigarettes    Quit date: 09/01/2020    Years since quitting: 0.8   Smokeless tobacco: Never  Vaping Use   Vaping Use: Never used  Substance and Sexual Activity   Alcohol use: Never   Drug use: Never   Sexual activity: Not on file  Other Topics Concern   Not on file  Social History Narrative   Lives with step daughter, Daniel Barker who moved them from [facility] [place] to    [employer]   He is hard of hearing and needs eye surgery   Social Determinants of Health   Financial Resource Strain: Low Risk    Difficulty of Paying Living Expenses: Not hard at all  Food Insecurity: No Food Insecurity   Worried About Sales executive in the Last Year: Never true   [place] in the Last Year: Never true  Transportation Needs: No Transportation Needs   Lack of Transportation (Medical): No   Lack of Transportation (Non-Medical): No  Physical Activity: Not on file  Stress: No Stress Concern Present   Feeling of Stress : Only a little  Social Connections: Unknown   Frequency of Communication with Friends and Family: More than three times a week   Frequency of Social Gatherings with Friends and Family: More than three times a week   Attends Religious Services: Patient refused   Active Member of Clubs or Organizations: Patient refused  Attends Archivist Meetings: Patient refused   Marital Status: Married   Family History  Problem Relation Age of Onset   Hypertension Mother    Clotting disorder Mother    COPD Father    Scheduled Meds:  azithromycin  500 mg Oral q1800   budesonide (PULMICORT) nebulizer solution  0.25 mg Nebulization BID   Chlorhexidine Gluconate Cloth  6 each Topical Daily   clopidogrel  75 mg Oral Daily   docusate  100 mg Per Tube BID   guaiFENesin  1,200 mg Oral BID   heparin  5,000 Units Subcutaneous Q8H   ipratropium-albuterol  3 mL Nebulization BID   methylPREDNISolone (SOLU-MEDROL) injection  80 mg Intravenous Q12H   montelukast  10 mg Oral QHS   pantoprazole  40 mg Oral BID   polyethylene glycol  17 g Per Tube Daily   propafenone  225 mg Oral Q8H   tamsulosin  0.4 mg Oral QPC supper   torsemide  20 mg Oral Daily   Continuous Infusions: PRN Meds:.albuterol Medications Prior to Admission:  Prior to Admission medications   Medication Sig Start Date End Date Taking? Authorizing Provider  acetaminophen (TYLENOL) 500 MG tablet Take 1,000 mg by mouth every 6 (six) hours as needed.    [provider]  albuterol (PROVENTIL) (2.5 MG/3ML) 0.083% nebulizer solution Take 3 mLs (2.5 mg total) by nebulization every 6 (six) hours as needed for wheezing or shortness of  breath. 07/05/21   Tanda Rockers, MD  albuterol (VENTOLIN HFA) 108 (90 Base) MCG/ACT inhaler INHALE 2 PUFFS BY MOUTH EVERY 4 HOURS AS NEEDED Patient taking differently: Inhale 1-2 puffs into the lungs every 4 (four) hours as needed for wheezing or shortness of breath. 07/05/21   Tanda Rockers, MD  amLODipine (NORVASC) 5 MG tablet Take 5 mg by mouth daily. 06/04/21   [provider]  ascorbic acid (VITAMIN C) 500 MG tablet Take 1 tablet (500 mg total) by mouth daily. 05/24/21   Barton Dubois, MD  bisoprolol (ZEBETA) 5 MG tablet Take 1 tablet (5 mg total) by mouth 2 (two) times daily. 07/12/21   Tanda Rockers, MD  budesonide-formoterol (SYMBICORT) 160-4.5 MCG/ACT inhaler Take 2 puffs first thing in am and then another 2 puffs about 12 hours later. 10/27/20   Tanda Rockers, MD  clopidogrel (PLAVIX) 75 MG tablet Take 75 mg by mouth daily.    [provider]  fenofibrate micronized (LOFIBRA) 134 MG capsule Take 134 mg by mouth daily before breakfast.    [provider]  guaiFENesin-dextromethorphan (ROBITUSSIN DM) 100-10 MG/5ML syrup Take 5 mLs by mouth every 4 (four) hours as needed for cough. 05/23/21   Barton Dubois, MD  methylPREDNISolone (MEDROL DOSEPAK) 4 MG TBPK tablet Medrol Dosepak take as instructed 06/23/21   Shahmehdi, Erling Conte A, MD  montelukast (SINGULAIR) 10 MG tablet Take 10 mg by mouth at bedtime.    [provider]  pantoprazole (PROTONIX) 40 MG tablet Take 1 tablet (40 mg total) by mouth 2 (two) times daily. 05/23/21   Barton Dubois, MD  predniSONE (DELTASONE) 50 MG tablet Take 1 tablet (50 mg total) by mouth daily. 06/02/43   Delora Fuel, MD  propafenone (RYTHMOL) 225 MG tablet Take 1 tablet (225 mg total) by mouth every 8 (eight) hours. 02/03/21   Josue Hector, MD  tamsulosin (FLOMAX) 0.4 MG CAPS capsule TAKE 1 CAPSULE(0.4 MG) BY MOUTH DAILY 06/30/21   Alyson Ingles Candee Furbish, MD  torsemide (DEMADEX) 20 MG tablet  Take 1 tablet (20 mg total) by mouth daily.  06/23/21 07/23/21  Deatra James, MD  varenicline (CHANTIX) 0.5 MG tablet Take 0.5 mg by mouth 2 (two) times daily. 04/08/21   [provider]  zinc sulfate 220 (50 Zn) MG capsule Take 1 capsule (220 mg total) by mouth daily. 05/24/21   Barton Dubois, MD   Allergies  Allergen Reactions   Lipitor [Atorvastatin]    Review of Systems  Unable to perform ROS: Acuity of condition   Physical Exam Vitals and nursing note reviewed.  Constitutional:      General: He is not in acute distress.    Appearance: He is ill-appearing.  Cardiovascular:     Rate and Rhythm: Normal rate.  Pulmonary:     Effort: Pulmonary effort is normal. No tachypnea.  Skin:    General: Skin is warm and dry.  Neurological:     Mental Status: He is alert and oriented to person, place, and time.  Psychiatric:        Mood and Affect: Mood normal.        Behavior: Behavior normal.    Vital Signs: BP (!) 126/52   Pulse 65   Temp 97.9 F (36.6 C) (Oral)   Resp 18   Wt 69.6 kg   SpO2 97%   BMI 24.39 kg/m  Pain Scale: 0-10 POSS *See Group Information*: 1-Acceptable,Awake and alert Pain Score: 0-No pain   SpO2: SpO2: 97 % O2 Device:SpO2: 97 % O2 Flow Rate: .O2 Flow Rate (L/min): 2 L/min  IO: Intake/output summary:  Intake/Output Summary (Last 24 hours) at 07/19/2021 1524 Last data filed at 07/19/2021 1400 Gross per 24 hour  Intake 490 ml  Output 800 ml  Net -310 ml    LBM: Last BM Date:  (unknown) Baseline Weight: Weight: 69.4 kg Most recent weight: Weight: 69.6 kg     Palliative Assessment/Data:   Flowsheet Rows    Flowsheet Row Most Recent Value  Intake Tab   Referral Department Hospitalist  Unit at Time of Referral Med/Surg Unit  Palliative Care Primary Diagnosis Pulmonary  Date Notified 07/17/21  Palliative Care Type New Palliative care  Reason for referral Clarify Goals of Care  Date of Admission 07/17/21  Date first seen by Palliative Care 07/19/21  # of days Palliative  referral response time 2 Day(s)  # of days IP prior to Palliative referral 0  Clinical Assessment   Palliative Performance Scale Score 30%  Pain Max last 24 hours Not able to report  Pain Min Last 24 hours Not able to report  Dyspnea Max Last 24 Hours Not able to report  Dyspnea Min Last 24 hours Not able to report  Psychosocial & Spiritual Assessment   Palliative Care Outcomes        Time In: 1300 Time Out: 1410 Time Total: 70 minutes  Greater than 50%  of this time was spent counseling and coordinating care related to the above assessment and plan.  Signed by: Drue Novel, NP   Please contact Palliative Medicine Team phone at [phone] for questions and concerns.  For individual provider: See Shea Evans

## 2021-07-19 NOTE — Progress Notes (Signed)
PROGRESS NOTE    Daniel Barker  [id number] DOB: [birth year] DOA: 07/17/2021 PCP: [name], MD    Brief Narrative:  75 year old male with a history of oxygen dependent COPD, atrial fibrillation, chronic kidney disease stage IIIa, CAD status post CABG, presented in respiratory distress.  He was noted to be encephalopathic due to hypercapnia.  Felt to have COPD exacerbation started on BiPAP.  Did not have significant improvement in blood gas and ultimately required intubation.  After 24 hours, patient noted to have improved blood gas, mental status as well as air movement bilaterally.  He was extubated from ventilator on 8/21.  Palliative care consulted to help address goals of care.   Assessment & Plan:   Active Problems:   Cigarette smoker   Benign prostatic hyperplasia with lower urinary tract symptoms   Chronic atrial fibrillation (HCC)   Chronic kidney disease, stage 3a (HCC)   Chronic diastolic heart failure (HCC)   Acute respiratory failure with hypoxia and hypercarbia (HCC)   COPD exacerbation (HCC)   Acute metabolic encephalopathy   Acute on chronic respiratory failure with hypoxia and hypercarbia -Secondary to COPD exacerbation -Initially presented with diminished breath sounds and worsening mentation -ABG confirmed progressive hypercarbic respiratory failure -He did not have significant improvement BiPAP -Patient was intubated on 8/20 -Following intubation, hypercapnia significantly improved -On 8/21, patient was awake, breathing comfortably on ventilator with good inspiratory volumes and was able to follow commands -Patient was extubated on 8/21  COPD exacerbation -Continue on steroids -Currently on azithromycin -Continue bronchodilators and inhaled steroids  Chronic diastolic congestive heart failure -Patient does have some lower extremity edema, but no pulmonary edema noted on chest imaging -Continue on home dose of torsemide  Acute metabolic  encephalopathy -Secondary to hypercapnia -Mental status is back to baseline  Chronic kidney disease stage III -Creatinine currently at baseline, continue to follow  Paroxysmal atrial fibrillation -Appears to be chronically on Rythmol, will continue -Not on anticoagulation due to patient's choice -Currently on Plavix  BPH -Continue on tamsulosin  Goals of care -Patient has had repeated admissions for COPD exacerbation/respiratory issues -Long-term prognosis appears to be poor -Requested palliative care to help address CODE STATUS and further goals of care.   DVT prophylaxis: heparin injection 5,000 Units Start: 07/17/21 2200  Code Status: Full code Family Communication: updated step daughter [name] who is POA Disposition Plan: Status is: Inpatient  Remains inpatient appropriate because:Ongoing diagnostic testing needed not appropriate for outpatient work up and Inpatient level of care appropriate due to severity of illness  Dispo: The patient is from: Home              Anticipated d/c is to:  TBD              Patient currently is not medically stable to d/c.   Difficult to place patient No         Consultants:  Palliative care  Procedures:  ETT 8/20 > 8/21  Antimicrobials:  Azithromycin 8/20 >   Subjective: Feeling better today. Shortness of breath improving  Objective: Vitals:   07/19/21 1812 07/19/21 1900 07/19/21 1932 07/19/21 1937  BP: (!) 96/52 (!) 121/49    Pulse: 92 73    Resp: 20 (!) 25    Temp:      TempSrc:      SpO2: 92% 97% 96% 100%  Weight:        Intake/Output Summary (Last 24 hours) at 07/19/2021 2005 Last data filed at 07/19/2021 1702 Gross per  24 hour  Intake 490 ml  Output 1225 ml  Net -735 ml   Filed Weights   07/17/21 1909 07/18/21 0500  Weight: 69.4 kg 69.6 kg    Examination:  General exam: Alert, awake, oriented x 3 Respiratory system: Clear to auscultation. Respiratory effort normal. Cardiovascular system:RRR. No  murmurs, rubs, gallops. Gastrointestinal system: Abdomen is nondistended, soft and nontender. No organomegaly or masses felt. Normal bowel sounds heard. Central nervous system: Alert and oriented. No focal neurological deficits. Extremities: No C/C/E, +pedal pulses Skin: No rashes, lesions or ulcers Psychiatry: Judgement and insight appear normal. Mood & affect appropriate.      Data Reviewed: I have personally reviewed following labs and imaging studies  CBC: Recent Labs  Lab 07/17/21 1226 07/18/21 0404 07/19/21 0437  WBC 9.9 7.6 11.6*  NEUTROABS 7.0  --   --   HGB 12.0* 10.8* 10.0*  HCT 39.4 34.1* 31.8*  MCV 103.1* 100.6* 99.7  PLT 194 187 [id number]   Basic Metabolic Panel: Recent Labs  Lab 07/17/21 1226 07/18/21 0404 07/19/21 0437  NA 141 143 142  K 3.9 4.1 4.1  CL 99 97* 98  CO2 37* 33* 36*  GLUCOSE 102* 164* 148*  BUN 21 26* 25*  CREATININE 1.21 1.34* 1.21  CALCIUM 9.0 8.8* 8.9  MG  --  1.7 1.9  PHOS  --  3.1  --    GFR: Estimated Creatinine Clearance: 49.2 mL/min (by C-G formula based on SCr of 1.21 mg/dL). Liver Function Tests: Recent Labs  Lab 07/17/21 1226  AST 16  ALT 16  ALKPHOS 56  BILITOT 0.8  PROT 6.6  ALBUMIN 3.5   No results for input(s): LIPASE, AMYLASE in the last 168 hours. No results for input(s): AMMONIA in the last 168 hours. Coagulation Profile: No results for input(s): INR, PROTIME in the last 168 hours. Cardiac Enzymes: No results for input(s): CKTOTAL, CKMB, CKMBINDEX, TROPONINI in the last 168 hours. BNP (last 3 results) No results for input(s): PROBNP in the last 8760 hours. HbA1C: No results for input(s): HGBA1C in the last 72 hours. CBG: No results for input(s): GLUCAP in the last 168 hours. Lipid Profile: Recent Labs    07/18/21 0404  TRIG 143   Thyroid Function Tests: No results for input(s): TSH, T4TOTAL, FREET4, T3FREE, THYROIDAB in the last 72 hours. Anemia Panel: No results for input(s): VITAMINB12, FOLATE,  FERRITIN, TIBC, IRON, RETICCTPCT in the last 72 hours. Sepsis Labs: No results for input(s): PROCALCITON, LATICACIDVEN in the last 168 hours.  Recent Results (from the past 240 hour(s))  MRSA Next Gen by PCR, Nasal     Status: None   Collection Time: 07/17/21 10:28 PM   Specimen: Nasal Mucosa; Nasal Swab  Result Value Ref Range Status   MRSA by PCR Next Gen NOT DETECTED NOT DETECTED Final    Comment: (NOTE) The GeneXpert MRSA Assay (FDA approved for NASAL specimens only), is one component of a comprehensive MRSA colonization surveillance program. It is not intended to diagnose MRSA infection nor to guide or monitor treatment for MRSA infections. Test performance is not FDA approved in patients less than 73 years old. Performed at [facility], [address], [place], [place] [address]          Radiology Studies: Portable Chest xray  Result Date: 07/18/2021 CLINICAL DATA:  Endotracheal tube.  Asthma.  COPD.  Ex-smoker. EXAM: PORTABLE CHEST 1 VIEW COMPARISON:  Yesterday FINDINGS: Endotracheal tube terminates 4.9 cm above carina. Nasogastric tube terminates at the  gastric cardia with the side port just below the gastroesophageal junction. Prior median sternotomy. Midline trachea. Normal heart size. Atherosclerosis in the transverse aorta. No pleural effusion or pneumothorax. Diffuse peribronchial thickening. Mild left base scarring or subsegmental atelectasis laterally. IMPRESSION: Endotracheal tube remains appropriately positioned. No acute cardiopulmonary disease. Peribronchial thickening which may relate to chronic bronchitis or smoking. Electronically Signed   By: Abigail Miyamoto M.D.   On: 07/18/2021 05:32        Scheduled Meds:  azithromycin  500 mg Oral q1800   budesonide (PULMICORT) nebulizer solution  0.25 mg Nebulization BID   Chlorhexidine Gluconate Cloth  6 each Topical Daily   clopidogrel  75 mg Oral Daily   docusate  100 mg Per Tube BID   guaiFENesin  1,200 mg Oral BID    heparin  5,000 Units Subcutaneous Q8H   ipratropium-albuterol  3 mL Nebulization BID   methylPREDNISolone (SOLU-MEDROL) injection  80 mg Intravenous Q12H   montelukast  10 mg Oral QHS   pantoprazole  40 mg Oral BID   polyethylene glycol  17 g Per Tube Daily   propafenone  225 mg Oral Q8H   tamsulosin  0.4 mg Oral QPC supper   torsemide  20 mg Oral Daily   Continuous Infusions:     LOS: 2 days    Time spent: 54mns    JKathie Dike MD [facility]   If 7PM-7AM, please contact night-coverage [email or web address]  07/19/2021, 8:05 PM

## 2021-07-19 NOTE — Progress Notes (Signed)
eLink Physician-Brief Progress Note Patient Name: Daniel Barker DOB: 04-03-1946 MRN: [id number]   Date of Service  07/19/2021  HPI/Events of Note  Elink to do: camera eval.  Extubated in noon , COPD exacerbation to 2 lit nasal o2.  Camera: Alert and using Y[place]auer to self suctioning. Stable Vitals and sats. RR is under 14.  eICU Interventions  Continue care     Intervention Category Intermediate Interventions: Other:  Elmer Sow 07/19/2021, 12:55 AM

## 2021-07-19 NOTE — Patient Outreach (Signed)
[facility]) Care Management  07/19/2021  Nicholis Hennigan 04-29-1946 [id number]   [place] coordination-hospitalization  Noted with hospital admission on 07/17/21 in ICU 2022 with acute on chronic respiratory failure with hypoxia and hypercarbia secondary to COPD exacerbation Palliative care has been introduced  Plan [facility] RN CM will follow up with patient within the next 7-10 business days   Olia Hinderliter L. Lavina Hamman, RN, BSN, [place] Coordinator Office number [phone] Mobile number [phone]  Main [facility] number [phone] Fax number [phone]

## 2021-07-20 DIAGNOSIS — Z515 Encounter for palliative care: Secondary | ICD-10-CM | POA: Diagnosis not present

## 2021-07-20 DIAGNOSIS — J9602 Acute respiratory failure with hypercapnia: Secondary | ICD-10-CM | POA: Diagnosis not present

## 2021-07-20 DIAGNOSIS — J9601 Acute respiratory failure with hypoxia: Secondary | ICD-10-CM | POA: Diagnosis not present

## 2021-07-20 DIAGNOSIS — Z7189 Other specified counseling: Secondary | ICD-10-CM

## 2021-07-20 NOTE — Progress Notes (Signed)
PROGRESS NOTE    Daniel Barker  [id number] DOB: 08-Mar-1946 DOA: 07/17/2021 PCP: [name], MD    Brief Narrative:  75 year old male with a history of oxygen dependent COPD, atrial fibrillation, chronic kidney disease stage IIIa, CAD status post CABG, presented in respiratory distress.  He was noted to be encephalopathic due to hypercapnia.  Felt to have COPD exacerbation started on BiPAP.  Did not have significant improvement in blood gas and ultimately required intubation.  After 24 hours, patient noted to have improved blood gas, mental status as well as air movement bilaterally.  He was extubated from ventilator on 8/21.  Palliative care consulted to help address goals of care.   Assessment & Plan:   Active Problems:   Cigarette smoker   Benign prostatic hyperplasia with lower urinary tract symptoms   Chronic atrial fibrillation (HCC)   Chronic kidney disease, stage 3a (HCC)   Chronic diastolic heart failure (HCC)   Acute respiratory failure with hypoxia and hypercarbia (HCC)   COPD exacerbation (HCC)   Acute metabolic encephalopathy   Acute on chronic respiratory failure with hypoxia and hypercarbia -Secondary to COPD exacerbation -Initially presented with diminished breath sounds and worsening mentation -ABG confirmed progressive hypercarbic respiratory failure -He did not have significant improvement BiPAP -Patient was intubated on 8/20 -Following intubation, hypercapnia significantly improved -On 8/21, patient was awake, breathing comfortably on ventilator with good inspiratory volumes and was able to follow commands -Patient was extubated on 8/21 -Currently on 2 L of oxygen  COPD exacerbation -Continue on steroids -Currently on azithromycin -Continue bronchodilators and inhaled steroids -Still feels somewhat short of breath -If he is feeling improved by tomorrow, can consider transitioning Solu-Medrol to prednisone  Chronic diastolic congestive heart  failure -Patient does have some lower extremity edema, but no pulmonary edema noted on chest imaging -Continue on home dose of torsemide  Acute metabolic encephalopathy -Secondary to hypercapnia -Mental status is back to baseline  Chronic kidney disease stage III -Creatinine currently at baseline, continue to follow  Paroxysmal atrial fibrillation -Appears to be chronically on Rythmol, will continue -Not on anticoagulation due to patient's choice -Currently on Plavix  BPH -Continue on tamsulosin  Goals of care -Patient has had repeated admissions for COPD exacerbation/respiratory issues -Long-term prognosis appears to be poor -Seen by palliative care and wishes to remain full code at this time -Outpatient palliative care to follow after discharge   DVT prophylaxis: heparin injection 5,000 Units Start: 07/17/21 2200  Code Status: Full code Family Communication: updated step daughter [name] who is POA Disposition Plan: Status is: Inpatient  Remains inpatient appropriate because:Ongoing diagnostic testing needed not appropriate for outpatient work up and Inpatient level of care appropriate due to severity of illness  Dispo: The patient is from: Home              Anticipated d/c is to: Home              Patient currently is not medically stable to d/c.   Difficult to place patient No         Consultants:  Palliative care  Procedures:  ETT 8/20 > 8/21  Antimicrobials:  Azithromycin 8/20 >   Subjective: Still has some shortness of breath.  Does not feel that respiratory status is back to baseline yet.  Objective: Vitals:   07/20/21 1107 07/20/21 1500 07/20/21 1600 07/20/21 1605  BP:  (!) 142/54 (!) 132/55   Pulse: 63 67    Resp: (!) 23 (!) 22  13  Temp: 97.6 F (36.4 C)   97.7 F (36.5 C)  TempSrc: Oral   Oral  SpO2: 100% 100%    Weight:        Intake/Output Summary (Last 24 hours) at 07/20/2021 1843 Last data filed at 07/20/2021 1809 Gross per 24  hour  Intake 360 ml  Output 1800 ml  Net -1440 ml   Filed Weights   07/17/21 1909 07/18/21 0500 07/20/21 0422  Weight: 69.4 kg 69.6 kg 70.9 kg    Examination:  General exam: Alert, awake, oriented x 3 Respiratory system: Diminished breath sounds bilaterally. Respiratory effort normal. Cardiovascular system:RRR. No murmurs, rubs, gallops. Gastrointestinal system: Abdomen is nondistended, soft and nontender. No organomegaly or masses felt. Normal bowel sounds heard. Central nervous system: Alert and oriented. No focal neurological deficits. Extremities: No C/C/E, +pedal pulses Skin: No rashes, lesions or ulcers Psychiatry: Judgement and insight appear normal. Mood & affect appropriate.       Data Reviewed: I have personally reviewed following labs and imaging studies  CBC: Recent Labs  Lab 07/17/21 1226 07/18/21 0404 07/19/21 0437  WBC 9.9 7.6 11.6*  NEUTROABS 7.0  --   --   HGB 12.0* 10.8* 10.0*  HCT 39.4 34.1* 31.8*  MCV 103.1* 100.6* 99.7  PLT 194 187 [id number]   Basic Metabolic Panel: Recent Labs  Lab 07/17/21 1226 07/18/21 0404 07/19/21 0437  NA 141 143 142  K 3.9 4.1 4.1  CL 99 97* 98  CO2 37* 33* 36*  GLUCOSE 102* 164* 148*  BUN 21 26* 25*  CREATININE 1.21 1.34* 1.21  CALCIUM 9.0 8.8* 8.9  MG  --  1.7 1.9  PHOS  --  3.1  --    GFR: Estimated Creatinine Clearance: 49.2 mL/min (by C-G formula based on SCr of 1.21 mg/dL). Liver Function Tests: Recent Labs  Lab 07/17/21 1226  AST 16  ALT 16  ALKPHOS 56  BILITOT 0.8  PROT 6.6  ALBUMIN 3.5   No results for input(s): LIPASE, AMYLASE in the last 168 hours. No results for input(s): AMMONIA in the last 168 hours. Coagulation Profile: No results for input(s): INR, PROTIME in the last 168 hours. Cardiac Enzymes: No results for input(s): CKTOTAL, CKMB, CKMBINDEX, TROPONINI in the last 168 hours. BNP (last 3 results) No results for input(s): PROBNP in the last 8760 hours. HbA1C: No results for input(s):  HGBA1C in the last 72 hours. CBG: No results for input(s): GLUCAP in the last 168 hours. Lipid Profile: Recent Labs    07/18/21 0404  TRIG 143   Thyroid Function Tests: No results for input(s): TSH, T4TOTAL, FREET4, T3FREE, THYROIDAB in the last 72 hours. Anemia Panel: No results for input(s): VITAMINB12, FOLATE, FERRITIN, TIBC, IRON, RETICCTPCT in the last 72 hours. Sepsis Labs: No results for input(s): PROCALCITON, LATICACIDVEN in the last 168 hours.  Recent Results (from the past 240 hour(s))  MRSA Next Gen by PCR, Nasal     Status: None   Collection Time: 07/17/21 10:28 PM   Specimen: Nasal Mucosa; Nasal Swab  Result Value Ref Range Status   MRSA by PCR Next Gen NOT DETECTED NOT DETECTED Final    Comment: (NOTE) The GeneXpert MRSA Assay (FDA approved for NASAL specimens only), is one component of a comprehensive MRSA colonization surveillance program. It is not intended to diagnose MRSA infection nor to guide or monitor treatment for MRSA infections. Test performance is not FDA approved in patients less than 54 years old. Performed at [facility], [place]  [address], [place], [place] [address]          Radiology Studies: No results found.      Scheduled Meds:  azithromycin  500 mg Oral q1800   budesonide (PULMICORT) nebulizer solution  0.25 mg Nebulization BID   Chlorhexidine Gluconate Cloth  6 each Topical Daily   clopidogrel  75 mg Oral Daily   docusate  100 mg Per Tube BID   guaiFENesin  1,200 mg Oral BID   heparin  5,000 Units Subcutaneous Q8H   ipratropium-albuterol  3 mL Nebulization BID   methylPREDNISolone (SOLU-MEDROL) injection  80 mg Intravenous Q12H   montelukast  10 mg Oral QHS   pantoprazole  40 mg Oral BID   polyethylene glycol  17 g Per Tube Daily   propafenone  225 mg Oral Q8H   tamsulosin  0.4 mg Oral QPC supper   torsemide  20 mg Oral Daily   Continuous Infusions:     LOS: 3 days    Time spent: 87mns    JKathie Dike  MD [facility]   If 7PM-7AM, please contact night-coverage [email or web address]  07/20/2021, 6:43 PM

## 2021-07-20 NOTE — Evaluation (Signed)
Physical Therapy Evaluation Patient Details Name: Daniel Barker MRN: [id number] DOB: 1946-06-07 Today's Date: 07/20/2021   History of Present Illness  Daniel Barker is a 75 y.o. male with a history of 2L O2-dependent COPD, ongoing tobacco smoking, atrial fibrillation, BPH, stage IIIa CKD, CAD s/p CABG and stenting among others who presented to the ED 8/20 in respiratory distress. History was limited by encephalopathy related to respiratory distress.      Per daughter-in-law by phone:   Very tired, very weak, can't catch breath, some difficult to understand speech for the past 2 days. Each day getting a little bit worse. They tried to convince him to come to the ED last night but he's pretty fiercely independent. Starting to have urinary incontinence in the past few days. He finally consented to having his granddaughter bring him to the ED today.   Clinical Impression  Patient functioning near baseline for functional mobility and gait other than requiring use of RW for ambulation due to leaning on nearby objects for support.  Patient demonstrates good return for bed mobility, transfers and ambulation using RW without loss of balance, but SpO2 dropped from 98% to 80% while on room air during ambulation - RN notified.  Plan:  Patient discharged from physical therapy to care of nursing for ambulation daily using RW as tolerated for length of stay.      Follow Up Recommendations Home health PT;Supervision for mobility/OOB;Supervision - Intermittent    Equipment Recommendations  None recommended by PT    Recommendations for Other Services       Precautions / Restrictions Precautions Precautions: Fall Restrictions Weight Bearing Restrictions: No      Mobility  Bed Mobility Overal bed mobility: Modified Independent                  Transfers Overall transfer level: Modified independent                  Ambulation/Gait Ambulation/Gait assistance: Supervision Gait  Distance (Feet): 100 Feet Assistive device: Rolling walker (2 wheeled) Gait Pattern/deviations: Decreased step length - right;Decreased step length - left;Decreased stride length Gait velocity: decreased   General Gait Details: slightly labored cadence having to lean on nearby objects for support, required use of RW demonstrating good return for ambulation in room/hallways without loss of balance, on room air with SpO2 dropping from 98% to 80%  Stairs            Wheelchair Mobility    Modified Rankin (Stroke Patients Only)       Balance Overall balance assessment: Needs assistance Sitting-balance support: Feet supported;No upper extremity supported Sitting balance-Leahy Scale: Good Sitting balance - Comments: seated at EOB   Standing balance support: During functional activity;No upper extremity supported Standing balance-Leahy Scale: Poor Standing balance comment: fair/poor without AD, fair/good using RW                             Pertinent Vitals/Pain Pain Assessment: No/denies pain    Home Living Family/patient expects to be discharged to:: Private residence Living Arrangements: Spouse/significant other;Other relatives Available Help at Discharge: Family;Available PRN/intermittently Type of Home: Mobile home Home Access: Stairs to enter Entrance Stairs-Rails: Right;Left;Can reach both Entrance Stairs-Number of Steps: 3 Home Layout: One level Home Equipment: Cane - single point;Walker - 2 wheels;Shower seat;Bedside commode      Prior Function Level of Independence: Independent         Comments: household and short  distanced community ambulator, uses RW PRN, drives     Hand Dominance   Dominant Hand: Left    Extremity/Trunk Assessment   Upper Extremity Assessment Upper Extremity Assessment: Overall WFL for tasks assessed    Lower Extremity Assessment Lower Extremity Assessment: Generalized weakness    Cervical / Trunk  Assessment Cervical / Trunk Assessment: Normal  Communication   Communication: HOH  Cognition Arousal/Alertness: Awake/alert Behavior During Therapy: WFL for tasks assessed/performed Overall Cognitive Status: Within Functional Limits for tasks assessed                                        General Comments      Exercises     Assessment/Plan    PT Assessment All further PT needs can be met in the next venue of care  PT Problem List Decreased mobility;Decreased balance;Decreased activity tolerance;Decreased strength       PT Treatment Interventions      PT Goals (Current goals can be found in the Care Plan section)  Acute Rehab PT Goals Patient Stated Goal: return home with family to assist PT Goal Formulation: With patient Time For Goal Achievement: 07/20/21 Potential to Achieve Goals: Good    Frequency     Barriers to discharge        Co-evaluation               AM-PAC PT "6 Clicks" Mobility  Outcome Measure Help needed turning from your back to your side while in a flat bed without using bedrails?: None Help needed moving from lying on your back to sitting on the side of a flat bed without using bedrails?: None Help needed moving to and from a bed to a chair (including a wheelchair)?: None Help needed standing up from a chair using your arms (e.g., wheelchair or bedside chair)?: None Help needed to walk in hospital room?: A Little Help needed climbing 3-5 steps with a railing? : A Little 6 Click Score: 22    End of Session Equipment Utilized During Treatment: Oxygen Activity Tolerance: Patient tolerated treatment well;Patient limited by fatigue Patient left: in chair;with call bell/phone within reach Nurse Communication: Mobility status PT Visit Diagnosis: Unsteadiness on feet (R26.81);Other abnormalities of gait and mobility (R26.89);Muscle weakness (generalized) (M62.81)    Time: 2330-0762 PT Time Calculation (min) (ACUTE ONLY): 27  min   Charges:   PT Evaluation $PT Eval Moderate Complexity: 1 Mod PT Treatments $Therapeutic Activity: 23-37 mins        12:03 PM, 07/20/21 Lonell Grandchild, MPT Physical Therapist with [facility] [phone] office [phone] mobile phone

## 2021-07-20 NOTE — Progress Notes (Signed)
Palliative: [name] is sitting up in the [place] chair in his room with his lunch tray in front of him.  He greets me making and mostly keeping eye contact.  He is alert and oriented, able to make his basic needs known.  He is very hard of hearing.  There is no family at bedside at this time.  [name] tells me that he feels that he is improving.  He shares that this hospitalization really scared him.  He tells me that his plan is to return to [place] to see family again.  He tells me of his experience while intubated, stating that he was asked about reintubation if they pulled the tube.  He tells me that he could not talk during this time and was unsure of what choice to make.  [name] goes further to detail his choices related to [place].  [name] tells me that he would want attempted CPR/life support if there was a "50 to 60% chance" that he would survive.  He tells me that if he were intubated, then extubated, if he failed he would not want to be reintubated.   He was to have his stepdaughter, Daniel Barker, named as his healthcare surrogate.  When chaplain visited, he must have declined.  I encouraged him to complete this paperwork naming his healthcare surrogate  We talked about outpatient palliative services to continue goals of care, "what if's and maybe's", discussions.  He is agreeable.  Provider choice offered, and he chooses hospice of [facility].  Conference with attending, bedside nursing staff, transition of care team related to patient condition, needs, goals of care, disposition.  Plan: Full scope/full code, but did set limits on length of time and reintubation.  Home with home health services if qualified.  Outpatient palliative through hospice of [facility].  51 minutes Daniel Axe, NP Palliative medicine team Team phone [phone] Greater than 50% of this time was spent counseling and coordinating care related to the above assessment and plan.

## 2021-07-20 NOTE — TOC Transition Note (Signed)
Transition of Care [facility]) - CM/SW Discharge Note   Patient Details  Name: Wade Batdorf MRN: [id number] Date of Birth: Jul 14, 1946  [facility] of [facility]) CM/SW Contact:  Boneta Lucks, RN Phone Number: 07/20/2021, 3:19 PM   Clinical Narrative:   Patient ready for discharge home. Palliative consulted TOC to refer to [facility] Palliative services. PT recommended HHPT.  Patient refused HH, but agreeable to palliative. Referral sent to [facility].    Barriers to Discharge: Barriers Resolved  Patient Goals and CMS Choice Patient states their goals for this hospitalization and ongoing recovery are:: to go home. CMS Medicare.gov Compare Post Acute Care list provided to:: Patient    Discharge Placement          Discharge Plan and Services    [facility] Palliative services            Readmission Risk Interventions Readmission Risk Prevention Plan 07/20/2021 06/22/2021 06/08/2021  Transportation Screening Complete Complete Complete  PCP or Specialist Appt within 5-7 Days - - Complete  Home Care Screening - - Complete  Medication Review ([place]) Complete Complete -  [place] or Home Care Consult Complete Complete -  SW Recovery Care/Counseling Consult - Complete -  Palliative Care Screening - Not Applicable -  [place] - Not Applicable -

## 2021-07-21 DIAGNOSIS — J9602 Acute respiratory failure with hypercapnia: Secondary | ICD-10-CM | POA: Diagnosis not present

## 2021-07-21 DIAGNOSIS — G9341 Metabolic encephalopathy: Secondary | ICD-10-CM | POA: Diagnosis not present

## 2021-07-21 DIAGNOSIS — J9601 Acute respiratory failure with hypoxia: Secondary | ICD-10-CM | POA: Diagnosis not present

## 2021-07-21 MED ORDER — PREDNISONE 50 MG PO TABS: 50.0000 mg | ORAL_TABLET | Freq: Every day | ORAL | 0 refills | Status: DC

## 2021-07-21 MED ORDER — METHYLPREDNISOLONE SODIUM SUCC 40 MG IJ SOLR
40.0000 mg | Freq: Two times a day (BID) | INTRAMUSCULAR | Status: DC
  Administered 2021-07-21: 40 mg via INTRAVENOUS
  Filled 2021-07-21: qty 1

## 2021-07-21 NOTE — TOC Transition Note (Signed)
Transition of Care [facility]) - CM/SW Discharge Note   Patient Details  Name: Daniel Barker MRN: [id number] Date of Birth: 29-Sep-1946  Transition of Care [facility]) CM/SW Contact:  Salome Arnt, LCSW Phone Number: 07/21/2021, 1:13 PM   Clinical Narrative:  Pt d/c today. LCSW spoke with pt who states family will be picking him up. Colletta [place] with [facility] notified of d/c. Pt aware. No other needs reported at this time.      Final next level of care: Home/Self Care Barriers to Discharge: Barriers Resolved   Patient Goals and CMS Choice Patient states their goals for this hospitalization and ongoing recovery are:: to go home. CMS Medicare.gov Compare Post Acute Care list provided to:: Patient    Discharge Placement                       Discharge Plan and Services                DME Arranged: N/A         HH Arranged: Refused HH          Social Determinants of Health (SDOH) Interventions     Readmission Risk Interventions Readmission Risk Prevention Plan 07/20/2021 06/22/2021 06/08/2021  Transportation Screening Complete Complete Complete  PCP or Specialist Appt within 5-7 Days - - Complete  Home Care Screening - - Complete  Medication Review ([place]) Complete Complete -  [place] or Home Care Consult Complete Complete -  SW Recovery Care/Counseling Consult - Complete -  Palliative Care Screening - Not Applicable -  [place] - Not Applicable -

## 2021-07-21 NOTE — Progress Notes (Signed)
Discharge instructions reviewed with patient and family. All questions answered.  Patient to pick up prednisone at pharmacy.

## 2021-07-21 NOTE — Progress Notes (Signed)
Waiting for transport for patient. Unable to coordinate ride until 4pm currently. Continuing to work on transport for discharge

## 2021-07-21 NOTE — Plan of Care (Signed)

## 2021-07-21 NOTE — Discharge Summary (Signed)
Physician Discharge Summary  Daniel Barker [id number] DOB: 06/05/1946 DOA: 07/17/2021  PCP: [name], MD  Admit date: 07/17/2021  Discharge date: 07/21/2021  Admitted From:Home  Disposition:  Home  Recommendations for Outpatient Follow-up:  Follow up with PCP in 1-2 weeks Continue on prednisone for 5 more days as prescribed Continue on other home medications as previously prescribed Follow-up with pulmonology with referral sent  Home Health:Outpatient palliative care  Equipment/Devices: Has home nasal cannula oxygen  Discharge Condition:Stable  CODE STATUS: Full  Diet recommendation: Heart Healthy  Brief/Interim Summary:  75 year old male with a history of oxygen dependent COPD, atrial fibrillation, chronic kidney disease stage IIIa, CAD status post CABG, presented in respiratory distress.  He was noted to be encephalopathic due to hypercapnia.  Felt to have COPD exacerbation started on BiPAP.  Did not have significant improvement in blood gas and ultimately required intubation.  After 24 hours, patient noted to have improved blood gas, mental status as well as air movement bilaterally.  He was extubated from ventilator on 8/21.  Palliative was consulted to help with addressing goals of care, but he still desires to be full code.  His COPD exacerbation has now resolved and he may transition to oral prednisone as prescribed for 5 more days.  He also has home oxygen as needed.  No other acute concerns or events noted throughout the course of this admission.  He will be referred to pulmonology in the outpatient setting for further follow-up.   Discharge Diagnoses:  Active Problems:   Cigarette smoker   Benign prostatic hyperplasia with lower urinary tract symptoms   Chronic atrial fibrillation (HCC)   Chronic kidney disease, stage 3a (HCC)   Chronic diastolic heart failure (HCC)   Acute respiratory failure with hypoxia and hypercarbia (HCC)   COPD exacerbation (HCC)    Acute metabolic encephalopathy  Principal discharge diagnosis: Acute on chronic hypoxemic and hypercarbic respiratory failure secondary to COPD exacerbation.  Discharge Instructions  Discharge Instructions     Diet - low sodium heart healthy   Complete by: As directed    Increase activity slowly   Complete by: As directed       Allergies as of 07/21/2021       Reactions   Lipitor [atorvastatin]         Medication List     STOP taking these medications    methylPREDNISolone 4 MG Tbpk tablet Commonly known as: MEDROL DOSEPAK       TAKE these medications    acetaminophen 500 MG tablet Commonly known as: TYLENOL Take 1,000 mg by mouth every 6 (six) hours as needed.   albuterol 108 (90 Base) MCG/ACT inhaler Commonly known as: VENTOLIN HFA INHALE 2 PUFFS BY MOUTH EVERY 4 HOURS AS NEEDED What changed:  how much to take how to take this when to take this reasons to take this additional instructions   albuterol (2.5 MG/3ML) 0.083% nebulizer solution Commonly known as: PROVENTIL Take 3 mLs (2.5 mg total) by nebulization every 6 (six) hours as needed for wheezing or shortness of breath. What changed: Another medication with the same name was changed. Make sure you understand how and when to take each.   amLODipine 5 MG tablet Commonly known as: NORVASC Take 5 mg by mouth daily.   ascorbic acid 500 MG tablet Commonly known as: VITAMIN C Take 1 tablet (500 mg total) by mouth daily.   bisoprolol 5 MG tablet Commonly known as: ZEBETA Take 1 tablet (5 mg total) by  mouth 2 (two) times daily.   budesonide-formoterol 160-4.5 MCG/ACT inhaler Commonly known as: Symbicort Take 2 puffs first thing in am and then another 2 puffs about 12 hours later.   clopidogrel 75 MG tablet Commonly known as: PLAVIX Take 75 mg by mouth daily.   fenofibrate micronized 134 MG capsule Commonly known as: LOFIBRA Take 134 mg by mouth daily before breakfast.    guaiFENesin-dextromethorphan 100-10 MG/5ML syrup Commonly known as: ROBITUSSIN DM Take 5 mLs by mouth every 4 (four) hours as needed for cough.   montelukast 10 MG tablet Commonly known as: SINGULAIR Take 10 mg by mouth at bedtime.   pantoprazole 40 MG tablet Commonly known as: PROTONIX Take 1 tablet (40 mg total) by mouth 2 (two) times daily.   predniSONE 50 MG tablet Commonly known as: DELTASONE Take 1 tablet (50 mg total) by mouth daily.   propafenone 225 MG tablet Commonly known as: RYTHMOL Take 1 tablet (225 mg total) by mouth every 8 (eight) hours.   tamsulosin 0.4 MG Caps capsule Commonly known as: FLOMAX TAKE 1 CAPSULE(0.4 MG) BY MOUTH DAILY   torsemide 20 MG tablet Commonly known as: Demadex Take 1 tablet (20 mg total) by mouth daily.   varenicline 0.5 MG tablet Commonly known as: CHANTIX Take 0.5 mg by mouth 2 (two) times daily.   zinc sulfate 220 (50 Zn) MG capsule Take 1 capsule (220 mg total) by mouth daily.        Follow-up Information     [name], MD. Schedule an appointment as soon as possible for a visit in 2 week(s).   Specialty: Internal Medicine Contact information: [place] [address] [phone]                Allergies  Allergen Reactions   Lipitor [Atorvastatin]     Consultations: Palliative care   Procedures/Studies: DG Chest 2 View  Result Date: 07/02/2021 CLINICAL DATA:  Shortness of breath EXAM: CHEST - 2 VIEW COMPARISON:  06/22/2021 FINDINGS: Prior CABG. Heart is normal size. Linear scarring in the lung bases. Mild hyperinflation. No effusions or acute bony abnormality. IMPRESSION: COPD/chronic changes. No active disease. Electronically Signed   By: Rolm Baptise M.D.   On: 07/02/2021 20:53   Portable Chest xray  Result Date: 07/18/2021 CLINICAL DATA:  Endotracheal tube.  Asthma.  COPD.  Ex-smoker. EXAM: PORTABLE CHEST 1 VIEW COMPARISON:  Yesterday FINDINGS: Endotracheal tube terminates 4.9  cm above carina. Nasogastric tube terminates at the gastric cardia with the side port just below the gastroesophageal junction. Prior median sternotomy. Midline trachea. Normal heart size. Atherosclerosis in the transverse aorta. No pleural effusion or pneumothorax. Diffuse peribronchial thickening. Mild left base scarring or subsegmental atelectasis laterally. IMPRESSION: Endotracheal tube remains appropriately positioned. No acute cardiopulmonary disease. Peribronchial thickening which may relate to chronic bronchitis or smoking. Electronically Signed   By: Abigail Miyamoto M.D.   On: 07/18/2021 05:32   DG Chest Portable 1 View  Result Date: 07/17/2021 CLINICAL DATA:  Dyspnea. EXAM: PORTABLE CHEST 1 VIEW COMPARISON:  07/02/2021 FINDINGS: Heart size is normal. There is linear atelectasis or scarring at the LEFT lung base, stable in appearance. No new consolidations or pulmonary edema. IMPRESSION: Stable LEFT LOWER lobe atelectasis or scarring. Electronically Signed   By: Nolon Nations M.D.   On: 07/17/2021 12:40   DG Chest Port 1 View  Result Date: 06/22/2021 CLINICAL DATA:  Shortness of breath and asthma, COVID positive 06/05/2021 EXAM: PORTABLE CHEST 1 VIEW  COMPARISON:  06/21/2021 FINDINGS: Stable cardiomegaly and previous median sternotomy. Similar hyperinflation and left basilar scarring. No superimposed acute pneumonia, significant collapse or consolidation. Negative for edema, large effusion or pneumothorax. Trachea midline. Aorta atherosclerotic. IMPRESSION: Stable chest exam.  No interval change or acute finding. Aortic Atherosclerosis (ICD10-I70.0). Electronically Signed   By: Jerilynn Mages.  Shick M.D.   On: 06/22/2021 10:09   DG Chest Port 1 View  Result Date: 06/21/2021 CLINICAL DATA:  Shortness of breath. EXAM: PORTABLE CHEST 1 VIEW COMPARISON:  06/05/2022 FINDINGS: Previous median sternotomy CABG procedure normal heart size. Aortic atherosclerosis. Heart size and mediastinal contours appear normal. No  pleural effusion or edema. Coarsened interstitial markings identified. No superimposed airspace consolidation. IMPRESSION: 1. No acute cardiopulmonary abnormalities. 2. Chronic interstitial coarsening. Electronically Signed   By: Kerby Moors M.D.   On: 06/21/2021 12:56   DG Chest Port 1V same Day  Result Date: 07/17/2021 CLINICAL DATA:  OG tube placement. EXAM: PORTABLE CHEST 1 VIEW COMPARISON:  07/17/2021 FINDINGS: Endotracheal tube is in place, approximately 5 centimeters above the carina. Orogastric tube is in place, tip overlying the level of the stomach, side port in the proximal stomach. Median sternotomy.  Heart size is normal.  Lungs are clear. IMPRESSION: Interval placement of orogastric tube. No evidence for acute cardiopulmonary abnormality. Electronically Signed   By: Nolon Nations M.D.   On: 07/17/2021 18:33   DG Chest Port 1V same Day  Result Date: 07/17/2021 CLINICAL DATA:  Postablation EXAM: PORTABLE CHEST 1 VIEW COMPARISON:  07/17/2021 FINDINGS: Postoperative changes in the mediastinum. An endotracheal tube is present with tip measuring about 1.5 cm above the carina. Enteric tube with tip in the left upper quadrant consistent with location in the upper stomach. Heart size and pulmonary vascularity are normal. No airspace disease or consolidation in the lungs. Linear scarring or atelectasis suggested in the left base. Similar appearance to previous study. No pleural effusions. No pneumothorax. Calcification of the aorta. IMPRESSION: Appliances appear in satisfactory position. Linear scarring or atelectasis in the left lung base similar to prior study. Otherwise no change. Electronically Signed   By: Lucienne Capers M.D.   On: 07/17/2021 17:29     Discharge Exam: Vitals:   07/21/21 1007 07/21/21 1100  BP: 131/87   Pulse: 67   Resp: 18   Temp: 97.6 F (36.4 C) 97.6 F (36.4 C)  SpO2: 97%    Vitals:   07/21/21 0837 07/21/21 0839 07/21/21 1007 07/21/21 1100  BP:   131/87    Pulse:   67   Resp:   18   Temp:   97.6 F (36.4 C) 97.6 F (36.4 C)  TempSrc:   Oral Oral  SpO2: 93% 95% 97%   Weight:        General: Pt is alert, awake, not in acute distress Cardiovascular: RRR, S1/S2 +, no rubs, no gallops Respiratory: CTA bilaterally, no wheezing, no rhonchi, currently on nasal cannula oxygen Abdominal: Soft, NT, ND, bowel sounds + Extremities: no edema, no cyanosis    The results of significant diagnostics from this hospitalization (including imaging, microbiology, ancillary and laboratory) are listed below for reference.     Microbiology: Recent Results (from the past 240 hour(s))  MRSA Next Gen by PCR, Nasal     Status: None   Collection Time: 07/17/21 10:28 PM   Specimen: Nasal Mucosa; Nasal Swab  Result Value Ref Range Status   MRSA by PCR Next Gen NOT DETECTED NOT DETECTED Final    Comment: (NOTE)  The GeneXpert MRSA Assay (FDA approved for NASAL specimens only), is one component of a comprehensive MRSA colonization surveillance program. It is not intended to diagnose MRSA infection nor to guide or monitor treatment for MRSA infections. Test performance is not FDA approved in patients less than 87 years old. Performed at [facility], [address] [place], [place] [address]      Labs: BNP (last 3 results) Recent Labs    06/21/21 1218 07/02/21 2230 07/17/21 1226  BNP 112.0*  98.0 302.0* [id number]*   Basic Metabolic Panel: Recent Labs  Lab 07/17/21 1226 07/18/21 0404 07/19/21 0437  NA 141 143 142  K 3.9 4.1 4.1  CL 99 97* 98  CO2 37* 33* 36*  GLUCOSE 102* 164* 148*  BUN 21 26* 25*  CREATININE 1.21 1.34* 1.21  CALCIUM 9.0 8.8* 8.9  MG  --  1.7 1.9  PHOS  --  3.1  --    Liver Function Tests: Recent Labs  Lab 07/17/21 1226  AST 16  ALT 16  ALKPHOS 56  BILITOT 0.8  PROT 6.6  ALBUMIN 3.5   No results for input(s): LIPASE, AMYLASE in the last 168 hours. No results for input(s): AMMONIA in the last 168  hours. CBC: Recent Labs  Lab 07/17/21 1226 07/18/21 0404 07/19/21 0437  WBC 9.9 7.6 11.6*  NEUTROABS 7.0  --   --   HGB 12.0* 10.8* 10.0*  HCT 39.4 34.1* 31.8*  MCV 103.1* 100.6* 99.7  PLT 194 187 168   Cardiac Enzymes: No results for input(s): CKTOTAL, CKMB, CKMBINDEX, TROPONINI in the last 168 hours. BNP: Invalid input(s): POCBNP CBG: No results for input(s): GLUCAP in the last 168 hours. D-Dimer No results for input(s): DDIMER in the last 72 hours. Hgb A1c No results for input(s): HGBA1C in the last 72 hours. Lipid Profile No results for input(s): CHOL, HDL, LDLCALC, TRIG, CHOLHDL, LDLDIRECT in the last 72 hours. Thyroid function studies No results for input(s): TSH, T4TOTAL, T3FREE, THYROIDAB in the last 72 hours.  Invalid input(s): FREET3 Anemia work up No results for input(s): VITAMINB12, FOLATE, FERRITIN, TIBC, IRON, RETICCTPCT in the last 72 hours. Urinalysis    Component Value Date/Time   COLORURINE YELLOW 07/17/2021 [place] 07/17/2021 1614   LABSPEC 1.015 07/17/2021 1614   PHURINE 5.0 07/17/2021 1614   GLUCOSEU NEGATIVE 07/17/2021 1614   HGBUR NEGATIVE 07/17/2021 [place] 07/17/2021 [place] 07/17/2021 1614   PROTEINUR 100 (A) 07/17/2021 1614   NITRITE NEGATIVE 07/17/2021 1614   LEUKOCYTESUR NEGATIVE 07/17/2021 1614   Sepsis Labs Invalid input(s): PROCALCITONIN,  WBC,  LACTICIDVEN Microbiology Recent Results (from the past 240 hour(s))  MRSA Next Gen by PCR, Nasal     Status: None   Collection Time: 07/17/21 10:28 PM   Specimen: Nasal Mucosa; Nasal Swab  Result Value Ref Range Status   MRSA by PCR Next Gen NOT DETECTED NOT DETECTED Final    Comment: (NOTE) The GeneXpert MRSA Assay (FDA approved for NASAL specimens only), is one component of a comprehensive MRSA colonization surveillance program. It is not intended to diagnose MRSA infection nor to guide or monitor treatment for MRSA  infections. Test performance is not FDA approved in patients less than 59 years old. Performed at [facility], [address], [place], [place] [address]      Time coordinating discharge: 35 minutes  SIGNED:   Rodena Goldmann, DO [facility] 07/21/2021, 11:10 AM  If 7PM-7AM, please  contact night-coverage [email or web address]

## 2021-07-21 NOTE — Progress Notes (Signed)
Spoke with patient's granddaughter [name], will be here at 430pm for pickup

## 2021-07-26 ENCOUNTER — Encounter: Payer: Self-pay | Admitting: Internal Medicine

## 2021-07-26 ENCOUNTER — Other Ambulatory Visit: Payer: Self-pay

## 2021-07-26 ENCOUNTER — Ambulatory Visit (INDEPENDENT_AMBULATORY_CARE_PROVIDER_SITE_OTHER): Payer: Medicare Other | Admitting: Internal Medicine

## 2021-07-26 DIAGNOSIS — J9611 Chronic respiratory failure with hypoxia: Secondary | ICD-10-CM | POA: Diagnosis not present

## 2021-07-26 DIAGNOSIS — J449 Chronic obstructive pulmonary disease, unspecified: Secondary | ICD-10-CM | POA: Diagnosis not present

## 2021-07-26 MED ORDER — PREDNISONE 10 MG PO TABS: ORAL_TABLET | ORAL | 0 refills | Status: DC

## 2021-07-26 MED ORDER — SPIRIVA RESPIMAT 2.5 MCG/ACT IN AERS: 2.0000 | INHALATION_SPRAY | Freq: Every day | RESPIRATORY_TRACT | 0 refills | Status: DC

## 2021-07-26 NOTE — Patient Instructions (Addendum)
Make sure you check your oxygen saturation  at your highest level of activity  to be sure it stays over 90% and adjust  02 flow upward to maintain this level if needed but remember to turn it back to previous settings when you stop (to conserve your supply).   Prednisone 10 mg 4  each am  x 2  then 3 x 2days then stay on 20 mg per day until better then 1 daily - if worse go back to 2 until better   Add spiriva 2.5 mcg  x 2 puffs each am after your am dose of symbicort but not the pm dose   Only use your albuterol nebulizer as a rescue medication to be used if you can't catch your breath by resting or doing a relaxed purse lip breathing pattern.  - The less you use it, the better it will work when you need it. - Ok to use up to every 4 hours if you must but call for immediate appointment if use goes up over your usual need    Please schedule a follow up office visit in 4 weeks, sooner if needed  with all medications /inhalers/ solutions in hand so we can verify exactly what you are taking. This includes all medications from all doctors and over the counters

## 2021-07-26 NOTE — Assessment & Plan Note (Addendum)
HC03   06/08/19  = 37 - ono RA  11/10/20  sats < 89% x 7h 33 min so rec 11/30/2020 = >>  needs 2lpm and repeat on 2lpm> refused 02 concentrator  - 12/08/2020 Patient Saturations on Room Air at Rest = 93% and while Ambulating = 85  on 2 Liters of oxygen while Ambulating = 92% - [facility]03  07/19/21  :  36   Though somewhat paradoxic, when the lung fails to clear C02 properly and pC02 rises the lung then becomes a more efficient scavenger of C02 allowing lower work of breathing and  better C02 clearance albeit at a higher serum pC02 level - this is why pts can look a lot better than their ABG's would suggest and why it's so difficult to prognosticate endstage dz.  It's also why I strongly rec DNI status (ventilating pts down to a nl pC02 adversely affects this compensatory mechanism)  > if fails again and req intubation will need to commit to NIV indefinitely but for now best to just adjust 02 to maint sats low 90s no higher.          Each maintenance medication was reviewed in detail including emphasizing most importantly the difference between maintenance and prns and under what circumstances the prns are to be triggered using an action plan format where appropriate.  Total time for H and P, chart review, counseling, reviewing hfa/neb device(s) and generating customized AVS unique to this post hosp f/u  office visit / same day charting > 30 min

## 2021-07-26 NOTE — Assessment & Plan Note (Signed)
Quit smoking 08/2020  - 07/09/2020  After extensive coaching inhaler device,  effectiveness =    75% (short Ti) - 07/09/2020 rec symbicort instead of  breztri due to bladder outlet obst but once foley out successully ok to try breztri - PFT's  08/25/20   FEV1 0.33 (12 % ) ratio 0.30  p 0 % improvement from saba with  FV curve severe concavity  p neb prior to study  - 07/26/2021  After extensive coaching inhaler device,  effectiveness =    75% with smi > try adding spiriva and pred 20 mg until better 10 mg daily    Group D in terms of symptom/risk and laba/lama/ICS  therefore appropriate rx at this point >>>  Add spiriva to symb 160 plus pred 20 mg ceiling and 10 mg floor x 4 weeks then regroup  Re saba: I spent extra time with pt today reviewing appropriate use of albuterol for prn use on exertion with the following points: 1) saba is for relief of sob that does not improve by walking a slower pace or resting but rather if the pt does not improve after trying this first. 2) If the pt is convinced, as many are, that saba helps recover from activity faster then it's easy to tell if this is the case by re-challenging : ie stop, take the inhaler, then p 5 minutes try the exact same activity (intensity of workload) that just caused the symptoms and see if they are substantially diminished or not after saba 3) if there is an activity that reproducibly causes the symptoms, try the saba 15 min before the activity on alternate days   If in fact the saba really does help, then fine to continue to use it prn but advised may need to look closer at the maintenance regimen being used to achieve better control of airways disease with exertion.
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
## 2021-07-28 ENCOUNTER — Other Ambulatory Visit: Payer: Self-pay | Admitting: *Deleted

## 2021-07-28 NOTE — Patient Outreach (Addendum)
Daniel Barker) Care Management  07/28/2021  Daniel Barker Feb 24, 1946 [id number]   [facility] Unsuccessful outreach  General discharge EMMI received on 07/27/21 for red flag sad, hopeless, anxious, or empty" Yes response for EMMI outreach on Monday 07/26/21 1045  Transition of care services noted to be completed by primary care MD office staff- Dr [name] Transition of Care will be completed by primary care provider office who will refer to [facility] care management if needed.   Outreach attempt to the home number  No answer. [facility] RN CM left HIPAA [facility] Portability and Accountability Act) compliant voicemail message along with CM's contact info.   Plan: [name] RN CM scheduled this patient for another call attempt within 4-7 business days Unsuccessful outreach on 07/28/21 Note forward to pcp, Dr Marcella Dubs L. Lavina Hamman, RN, BSN, [place] Coordinator Office number [phone] Mobile number [phone]  Main [facility] number [phone] Fax number [phone]

## 2021-07-29 ENCOUNTER — Other Ambulatory Visit: Payer: Self-pay | Admitting: *Deleted

## 2021-07-29 NOTE — Patient Outreach (Signed)
[place] [facility]) Care Management  07/29/2021  Daily Hueston 08-26-1946 [id number]   [place] coordination-  [facility] multidisciplinary care discussion (MCD)   [facility] MCD team was sent a template report on [id number]  [facility] RN CM reviewed Epic notes [facility] RN CM reviewed and provided an update on patient status and progression to the [facility] MCD team on 07/29/21 [facility] RN CM answered questions for MCD team  Recommendations - 1) follow up on palliative care services 2) follow up on pulmonology visit 3) follow up on caregiver services for his wife 4) follow up on smoke cessation  Plan [facility] RN CM will follow up with within the next 7-10 business days Routed note to MD   [place]. Lavina Hamman, RN, BSN, [place] Coordinator Office number [phone] Mobile number [phone]  Main [facility] number [phone] Fax number [phone]

## 2021-08-05 ENCOUNTER — Other Ambulatory Visit: Payer: Self-pay | Admitting: *Deleted

## 2021-08-05 ENCOUNTER — Other Ambulatory Visit: Payer: Self-pay

## 2021-08-05 NOTE — Patient Outreach (Signed)
[place] [facility]) Care Management  08/05/2021  Daniel Barker 07/07/1946 [id number]   [facility] outreach to complex care patient    Daniel Daniel Barker was referred to [facility] on 05/23/21 by [facility] hospital liaison  Referral Reason: post hospital/complex care  Insurance: united healthcare medicare   Patient is able to verify HIPAA ([place] and Accountability Act) identifiers Reviewed and addressed the purpose of the follow up call with the patient   Consent: [facility] RN CM reviewed [facility] services with patient. Patient gave verbal consent for services.   Assessment  Daniel Barker reports today that hie has been "Sick to my stomach" but better with breathing/respiratory except when he talk for too long A change in his voice tone is noted after speaking for a period of time    Reviewed palliative care vs hospice He confirms he denied need of hospice   His primary concern now is related to his and his wife's multiple bills and co pays   Suggestion he apply for medicaid  He is concern with increasing bills and a charge of $6000 related to his wife's rehab after she fell and broke her hip  Reviewed medicare parts A, B, C, D Reviewed [place] billing assistance by outreaching to the number on his bills  Concluded as patient had to go to rest room   Plans Patient agrees to care plan and follow up within the next 7-10 business days  Jillianne Gamino L. Lavina Hamman, RN, BSN, [place] Coordinator Office number [phone] Main [facility] number [phone] Fax number [phone]

## 2021-08-08 ENCOUNTER — Emergency Department (HOSPITAL_COMMUNITY): Payer: Medicare Other

## 2021-08-08 ENCOUNTER — Other Ambulatory Visit: Payer: Self-pay

## 2021-08-08 ENCOUNTER — Encounter (HOSPITAL_COMMUNITY): Payer: Self-pay | Admitting: *Deleted

## 2021-08-08 ENCOUNTER — Inpatient Hospital Stay (HOSPITAL_COMMUNITY)
Admission: EM | Admit: 2021-08-08 | Discharge: 2021-08-12 | DRG: 177 | Disposition: A | Discharge status: Home / Self Care | Payer: Medicare Other | Attending: Family Medicine | Admitting: Family Medicine

## 2021-08-08 DIAGNOSIS — I482 Chronic atrial fibrillation, unspecified: Secondary | ICD-10-CM | POA: Diagnosis present

## 2021-08-08 DIAGNOSIS — Z743 Need for continuous supervision: Secondary | ICD-10-CM | POA: Diagnosis not present

## 2021-08-08 DIAGNOSIS — Z888 Allergy status to other drugs, medicaments and biological substances status: Secondary | ICD-10-CM

## 2021-08-08 DIAGNOSIS — Z87891 Personal history of nicotine dependence: Secondary | ICD-10-CM

## 2021-08-08 DIAGNOSIS — R54 Age-related physical debility: Secondary | ICD-10-CM | POA: Diagnosis not present

## 2021-08-08 DIAGNOSIS — J9621 Acute and chronic respiratory failure with hypoxia: Secondary | ICD-10-CM | POA: Diagnosis present

## 2021-08-08 DIAGNOSIS — H9193 Unspecified hearing loss, bilateral: Secondary | ICD-10-CM | POA: Diagnosis present

## 2021-08-08 DIAGNOSIS — T380X5A Adverse effect of glucocorticoids and synthetic analogues, initial encounter: Secondary | ICD-10-CM | POA: Diagnosis not present

## 2021-08-08 DIAGNOSIS — Z7952 Long term (current) use of systemic steroids: Secondary | ICD-10-CM

## 2021-08-08 DIAGNOSIS — J9622 Acute and chronic respiratory failure with hypercapnia: Secondary | ICD-10-CM | POA: Diagnosis not present

## 2021-08-08 DIAGNOSIS — Z8249 Family history of ischemic heart disease and other diseases of the circulatory system: Secondary | ICD-10-CM

## 2021-08-08 DIAGNOSIS — I5032 Chronic diastolic (congestive) heart failure: Secondary | ICD-10-CM | POA: Diagnosis not present

## 2021-08-08 DIAGNOSIS — Z7951 Long term (current) use of inhaled steroids: Secondary | ICD-10-CM

## 2021-08-08 DIAGNOSIS — Y92239 Unspecified place in hospital as the place of occurrence of the external cause: Secondary | ICD-10-CM | POA: Diagnosis not present

## 2021-08-08 DIAGNOSIS — I130 Hypertensive heart and chronic kidney disease with heart failure and stage 1 through stage 4 chronic kidney disease, or unspecified chronic kidney disease: Secondary | ICD-10-CM | POA: Diagnosis present

## 2021-08-08 DIAGNOSIS — R0689 Other abnormalities of breathing: Secondary | ICD-10-CM | POA: Diagnosis not present

## 2021-08-08 DIAGNOSIS — Z825 Family history of asthma and other chronic lower respiratory diseases: Secondary | ICD-10-CM | POA: Diagnosis not present

## 2021-08-08 DIAGNOSIS — N1831 Chronic kidney disease, stage 3a: Secondary | ICD-10-CM | POA: Diagnosis not present

## 2021-08-08 DIAGNOSIS — E119 Type 2 diabetes mellitus without complications: Secondary | ICD-10-CM

## 2021-08-08 DIAGNOSIS — J342 Deviated nasal septum: Secondary | ICD-10-CM | POA: Diagnosis present

## 2021-08-08 DIAGNOSIS — Z7902 Long term (current) use of antithrombotics/antiplatelets: Secondary | ICD-10-CM

## 2021-08-08 DIAGNOSIS — U071 COVID-19: Principal | ICD-10-CM | POA: Diagnosis present

## 2021-08-08 DIAGNOSIS — Z79899 Other long term (current) drug therapy: Secondary | ICD-10-CM

## 2021-08-08 DIAGNOSIS — E1122 Type 2 diabetes mellitus with diabetic chronic kidney disease: Secondary | ICD-10-CM | POA: Diagnosis not present

## 2021-08-08 DIAGNOSIS — J9601 Acute respiratory failure with hypoxia: Secondary | ICD-10-CM | POA: Diagnosis present

## 2021-08-08 DIAGNOSIS — J441 Chronic obstructive pulmonary disease with (acute) exacerbation: Principal | ICD-10-CM | POA: Diagnosis present

## 2021-08-08 DIAGNOSIS — E1165 Type 2 diabetes mellitus with hyperglycemia: Secondary | ICD-10-CM | POA: Diagnosis not present

## 2021-08-08 DIAGNOSIS — I5033 Acute on chronic diastolic (congestive) heart failure: Secondary | ICD-10-CM | POA: Diagnosis present

## 2021-08-08 DIAGNOSIS — Z951 Presence of aortocoronary bypass graft: Secondary | ICD-10-CM

## 2021-08-08 DIAGNOSIS — Z9981 Dependence on supplemental oxygen: Secondary | ICD-10-CM

## 2021-08-08 DIAGNOSIS — R059 Cough, unspecified: Secondary | ICD-10-CM | POA: Diagnosis not present

## 2021-08-08 DIAGNOSIS — Z832 Family history of diseases of the blood and blood-forming organs and certain disorders involving the immune mechanism: Secondary | ICD-10-CM | POA: Diagnosis not present

## 2021-08-08 DIAGNOSIS — Z955 Presence of coronary angioplasty implant and graft: Secondary | ICD-10-CM

## 2021-08-08 DIAGNOSIS — R0602 Shortness of breath: Secondary | ICD-10-CM | POA: Diagnosis not present

## 2021-08-08 DIAGNOSIS — Z7189 Other specified counseling: Secondary | ICD-10-CM | POA: Diagnosis not present

## 2021-08-08 DIAGNOSIS — N400 Enlarged prostate without lower urinary tract symptoms: Secondary | ICD-10-CM | POA: Diagnosis present

## 2021-08-08 DIAGNOSIS — I480 Paroxysmal atrial fibrillation: Secondary | ICD-10-CM | POA: Diagnosis present

## 2021-08-08 DIAGNOSIS — R001 Bradycardia, unspecified: Secondary | ICD-10-CM | POA: Diagnosis not present

## 2021-08-08 DIAGNOSIS — I2510 Atherosclerotic heart disease of native coronary artery without angina pectoris: Secondary | ICD-10-CM | POA: Diagnosis present

## 2021-08-08 DIAGNOSIS — Z515 Encounter for palliative care: Secondary | ICD-10-CM | POA: Diagnosis not present

## 2021-08-08 LAB — CBC WITH DIFFERENTIAL/PLATELET
Abs Immature Granulocytes: 0.22 10*3/uL — ABNORMAL HIGH (ref 0.00–0.07)
Basophils Absolute: 0 10*3/uL (ref 0.0–0.1)
Basophils Relative: 0 %
Eosinophils Absolute: 0 10*3/uL (ref 0.0–0.5)
Eosinophils Relative: 0 %
HCT: 40.4 % (ref 39.0–52.0)
Hemoglobin: 12.3 g/dL — ABNORMAL LOW (ref 13.0–17.0)
Immature Granulocytes: 2 %
Lymphocytes Relative: 5 %
Lymphs Abs: 0.7 10*3/uL (ref 0.7–4.0)
MCH: 31.6 pg (ref 26.0–34.0)
MCHC: 30.4 g/dL (ref 30.0–36.0)
MCV: 103.9 fL — ABNORMAL HIGH (ref 80.0–100.0)
Monocytes Absolute: 0.2 10*3/uL (ref 0.1–1.0)
Monocytes Relative: 2 %
Neutro Abs: 13 10*3/uL — ABNORMAL HIGH (ref 1.7–7.7)
Neutrophils Relative %: 91 %
Platelets: 180 10*3/uL (ref 150–400)
RBC: 3.89 MIL/uL — ABNORMAL LOW (ref 4.22–5.81)
RDW: 14.6 % (ref 11.5–15.5)
WBC: 14.2 10*3/uL — ABNORMAL HIGH (ref 4.0–10.5)
nRBC: 0 % (ref 0.0–0.2)

## 2021-08-08 LAB — RESP PANEL BY RT-PCR (FLU A&B, COVID) ARPGX2
Influenza A by PCR: NEGATIVE
Influenza B by PCR: NEGATIVE
SARS Coronavirus 2 by RT PCR: POSITIVE — AB

## 2021-08-08 LAB — BLOOD GAS, ARTERIAL
Acid-Base Excess: 19.7 mmol/L — ABNORMAL HIGH (ref 0.0–2.0)
Bicarbonate: 41.3 mmol/L — ABNORMAL HIGH (ref 20.0–28.0)
FIO2: 40
O2 Saturation: 92.1 %
Patient temperature: 36.5
pCO2 arterial: 86.1 mmHg (ref 32.0–48.0)
pH, Arterial: 7.349 — ABNORMAL LOW (ref 7.350–7.450)
pO2, Arterial: 68.7 mmHg — ABNORMAL LOW (ref 83.0–108.0)

## 2021-08-08 LAB — COMPREHENSIVE METABOLIC PANEL
ALT: 19 U/L (ref 0–44)
AST: 17 U/L (ref 15–41)
Albumin: 3.9 g/dL (ref 3.5–5.0)
Alkaline Phosphatase: 43 U/L (ref 38–126)
Anion gap: 11 (ref 5–15)
BUN: 35 mg/dL — ABNORMAL HIGH (ref 8–23)
CO2: 42 mmol/L — ABNORMAL HIGH (ref 22–32)
Calcium: 9.2 mg/dL (ref 8.9–10.3)
Chloride: 88 mmol/L — ABNORMAL LOW (ref 98–111)
Creatinine, Ser: 1.62 mg/dL — ABNORMAL HIGH (ref 0.61–1.24)
GFR, Estimated: 44 mL/min — ABNORMAL LOW (ref >60)
Glucose, Bld: 174 mg/dL — ABNORMAL HIGH (ref 70–99)
Potassium: 4.6 mmol/L (ref 3.5–5.1)
Sodium: 141 mmol/L (ref 135–145)
Total Bilirubin: 0.9 mg/dL (ref 0.3–1.2)
Total Protein: 7 g/dL (ref 6.5–8.1)

## 2021-08-08 LAB — TROPONIN I (HIGH SENSITIVITY)
Troponin I (High Sensitivity): 14 ng/L (ref <18)
Troponin I (High Sensitivity): 11 ng/L (ref <18)

## 2021-08-08 LAB — BRAIN NATRIURETIC PEPTIDE: B Natriuretic Peptide: 812 pg/mL — ABNORMAL HIGH (ref 0.0–100.0)

## 2021-08-08 MED ORDER — ENOXAPARIN SODIUM 40 MG/0.4ML IJ SOSY: 40.0000 mg | PREFILLED_SYRINGE | INTRAMUSCULAR | Status: DC

## 2021-08-08 MED ORDER — ALBUTEROL (5 MG/ML) CONTINUOUS INHALATION SOLN
10.0000 mg/h | INHALATION_SOLUTION | RESPIRATORY_TRACT | Status: AC
  Administered 2021-08-08: 10 mg/h via RESPIRATORY_TRACT
  Filled 2021-08-08: qty 20

## 2021-08-08 MED ORDER — INSULIN ASPART 100 UNIT/ML IJ SOLN
0.0000 [IU] | Freq: Every day | INTRAMUSCULAR | Status: DC
  Administered 2021-08-09 – 2021-08-10 (×2): 2 [IU] via SUBCUTANEOUS
  Filled 2021-08-08: qty 1

## 2021-08-08 MED ORDER — IPRATROPIUM-ALBUTEROL 0.5-2.5 (3) MG/3ML IN SOLN
3.0000 mL | Freq: Four times a day (QID) | RESPIRATORY_TRACT | Status: DC
  Administered 2021-08-08 – 2021-08-12 (×14): 3 mL via RESPIRATORY_TRACT
  Filled 2021-08-08 (×14): qty 3

## 2021-08-08 MED ORDER — ONDANSETRON HCL 4 MG PO TABS: 4.0000 mg | ORAL_TABLET | Freq: Four times a day (QID) | ORAL | Status: DC | PRN

## 2021-08-08 MED ORDER — CLOPIDOGREL BISULFATE 75 MG PO TABS
75.0000 mg | ORAL_TABLET | Freq: Every day | ORAL | Status: DC
  Administered 2021-08-08 – 2021-08-12 (×4): 75 mg via ORAL
  Filled 2021-08-08 (×5): qty 1

## 2021-08-08 MED ORDER — MONTELUKAST SODIUM 10 MG PO TABS
10.0000 mg | ORAL_TABLET | Freq: Every day | ORAL | Status: DC
  Administered 2021-08-09 – 2021-08-11 (×4): 10 mg via ORAL
  Filled 2021-08-08 (×4): qty 1

## 2021-08-08 MED ORDER — ONDANSETRON HCL 4 MG/2ML IJ SOLN: 4.0000 mg | Freq: Four times a day (QID) | INTRAMUSCULAR | Status: DC | PRN

## 2021-08-08 MED ORDER — ACETAMINOPHEN 325 MG PO TABS: 650.0000 mg | ORAL_TABLET | Freq: Four times a day (QID) | ORAL | Status: DC | PRN

## 2021-08-08 MED ORDER — DOXYCYCLINE HYCLATE 100 MG PO TABS
100.0000 mg | ORAL_TABLET | Freq: Two times a day (BID) | ORAL | Status: DC
  Administered 2021-08-09 – 2021-08-12 (×6): 100 mg via ORAL
  Filled 2021-08-08 (×7): qty 1

## 2021-08-08 MED ORDER — METHYLPREDNISOLONE SODIUM SUCC 125 MG IJ SOLR: 60.0000 mg | Freq: Three times a day (TID) | INTRAMUSCULAR | Status: DC

## 2021-08-08 MED ORDER — PANTOPRAZOLE SODIUM 40 MG PO TBEC
40.0000 mg | DELAYED_RELEASE_TABLET | Freq: Two times a day (BID) | ORAL | Status: DC
  Administered 2021-08-09 – 2021-08-12 (×7): 40 mg via ORAL
  Filled 2021-08-08 (×8): qty 1

## 2021-08-08 MED ORDER — INSULIN ASPART 100 UNIT/ML IJ SOLN
0.0000 [IU] | Freq: Three times a day (TID) | INTRAMUSCULAR | Status: DC
  Administered 2021-08-09 – 2021-08-10 (×4): 2 [IU] via SUBCUTANEOUS
  Administered 2021-08-11: 3 [IU] via SUBCUTANEOUS
  Administered 2021-08-11: 5 [IU] via SUBCUTANEOUS
  Administered 2021-08-12: 2 [IU] via SUBCUTANEOUS
  Filled 2021-08-08 (×2): qty 1

## 2021-08-08 MED ORDER — DOXYCYCLINE HYCLATE 100 MG PO TABS
100.0000 mg | ORAL_TABLET | Freq: Once | ORAL | Status: AC
  Administered 2021-08-08: 100 mg via ORAL
  Filled 2021-08-08: qty 1

## 2021-08-08 MED ORDER — GUAIFENESIN ER 600 MG PO TB12
600.0000 mg | ORAL_TABLET | Freq: Two times a day (BID) | ORAL | Status: DC
  Administered 2021-08-09 – 2021-08-12 (×7): 600 mg via ORAL
  Filled 2021-08-08 (×8): qty 1

## 2021-08-08 MED ORDER — ENOXAPARIN SODIUM 40 MG/0.4ML IJ SOSY
40.0000 mg | PREFILLED_SYRINGE | INTRAMUSCULAR | Status: DC
  Administered 2021-08-09 – 2021-08-11 (×4): 40 mg via SUBCUTANEOUS
  Filled 2021-08-08 (×4): qty 0.4

## 2021-08-08 MED ORDER — METHYLPREDNISOLONE SODIUM SUCC 125 MG IJ SOLR
90.0000 mg | Freq: Two times a day (BID) | INTRAMUSCULAR | Status: DC
  Administered 2021-08-09 – 2021-08-10 (×3): 90 mg via INTRAVENOUS
  Filled 2021-08-08 (×3): qty 2

## 2021-08-08 MED ORDER — FUROSEMIDE 10 MG/ML IJ SOLN
40.0000 mg | Freq: Once | INTRAMUSCULAR | Status: AC
  Administered 2021-08-08: 40 mg via INTRAVENOUS
  Filled 2021-08-08: qty 4

## 2021-08-08 MED ORDER — FUROSEMIDE 10 MG/ML IJ SOLN
40.0000 mg | Freq: Three times a day (TID) | INTRAMUSCULAR | Status: DC
  Administered 2021-08-09: 40 mg via INTRAVENOUS
  Filled 2021-08-08: qty 4

## 2021-08-08 MED ORDER — MAGNESIUM SULFATE 2 GM/50ML IV SOLN
2.0000 g | INTRAVENOUS | Status: AC
  Administered 2021-08-08: 2 g via INTRAVENOUS
  Filled 2021-08-08: qty 50

## 2021-08-08 MED ORDER — ACETAMINOPHEN 650 MG RE SUPP: 650.0000 mg | Freq: Four times a day (QID) | RECTAL | Status: DC | PRN

## 2021-08-08 MED ORDER — METHYLPREDNISOLONE SODIUM SUCC 125 MG IJ SOLR
125.0000 mg | Freq: Once | INTRAMUSCULAR | Status: AC
  Administered 2021-08-08: 125 mg via INTRAVENOUS
  Filled 2021-08-08: qty 2

## 2021-08-08 MED ORDER — BISOPROLOL FUMARATE 5 MG PO TABS
5.0000 mg | ORAL_TABLET | Freq: Two times a day (BID) | ORAL | Status: DC
  Administered 2021-08-09: 5 mg via ORAL
  Filled 2021-08-08: qty 1

## 2021-08-08 MED ORDER — TAMSULOSIN HCL 0.4 MG PO CAPS
0.4000 mg | ORAL_CAPSULE | Freq: Every day | ORAL | Status: DC
  Administered 2021-08-08 – 2021-08-12 (×4): 0.4 mg via ORAL
  Filled 2021-08-08 (×5): qty 1

## 2021-08-08 MED ORDER — PROPAFENONE HCL 225 MG PO TABS
225.0000 mg | ORAL_TABLET | Freq: Three times a day (TID) | ORAL | Status: DC
  Administered 2021-08-09 – 2021-08-10 (×6): 225 mg via ORAL
  Filled 2021-08-08 (×16): qty 1

## 2021-08-08 NOTE — ED Notes (Signed)
Date and time results received: 08/08/21 1625   Test: COVID Critical Value: POSITIVE  Name of Provider Notified: EDP  Orders Received? Or Actions Taken?: Notified

## 2021-08-08 NOTE — Assessment & Plan Note (Addendum)
Continue with IV solumedrol, po doxycycline. duonebs q6h. Pt remains adamant that he wants to be a FULL CODE. Pt states he wants to be intubated again if he breathing gets worse.

## 2021-08-08 NOTE — H&P (Signed)
History and Physical    Daniel Barker [id number] DOB: [birth year] DOA: 08/08/2021  PCP: [name], MD   Patient coming from: Home  I have personally briefly reviewed patient's old medical records in [place]  CC: SOB HPI: 75 yo WM with hx of severe COPD, chronic hypercapnic and hypoxic respiratory failure on home O2 at 2 L minute, diabetes, chronic diastolic heart failure, bilateral hearing loss, chronic A. fib not on anticoagulation due to patient's choice, CKD stage III presents the ER today with a 2-day history of worsening shortness of breath.  Patient states that he was at a picnic on Friday.  Start developing worsening shortness of breath on Saturday.  Came to the ER on Sunday.  Patient states he is not any fevers today but did feel feverish on Saturday.  Patient has had a cough with worsening shortness of breath.  He describes his sputum is somewhat yellow.  This is quite normal for him.  His breathing difficulties have increased over the weekend.  Patient states that he lives with his stepdaughter and his demented wife.  Patient states that is not following a low-salt diet at home.  He states that when he was discharged last time to the hospital, his legs were improved as far as edema.  They started swelling about 5 days after discharge.  Patient states that he eats deli meat every day in form of a sandwich for lunch.  He also eats hot dogs several times a week.  He has never been counseled on a low-salt diet.  In the ER, patient noted to be hypoxic.  Submental O2 increased to 4 L a minute.  Chest X demonstrated COPD.  Laboratory findings include continued positive COVID-19 test.  He was also tested positive for COVID back in June 05, 2021.  ABG today pH 7.34 PCO2 of 86 PO2 of 68 on 4 L of oxygen.  white count 14.2.  BNP was elevated at 812, creatinine has increased to 1.62.  At discharge it was 1.21.  PCO2 is increased to 42.  Due to worsening hypoxia and COPD  exacerbation, [facility] hospitalist contacted for admission.   ED Course: start on IV solumedrol, IV lasix, po doxycycline.  Review of Systems:  Review of Systems  Constitutional:  Positive for fever and malaise/fatigue.  HENT:  Positive for hearing loss.        Chronic hearing loss  Eyes: Negative.   Respiratory:  Positive for cough and shortness of breath.   Cardiovascular: Negative.   Gastrointestinal: Negative.   Genitourinary: Negative.   Musculoskeletal:        +LE edema  Skin: Negative.   Neurological:  Positive for weakness.  Endo/Heme/Allergies: Negative.   Psychiatric/Behavioral: Negative.    All other systems reviewed and are negative.  Past Medical History:  Diagnosis Date   Acute metabolic encephalopathy [id number]   Asthma    Atrial fibrillation (HCC)    BPH (benign prostatic hyperplasia)    Cigarette smoker    CKD (chronic kidney disease)    COPD (chronic obstructive pulmonary disease) (HCC)    Coronary artery disease    Cough    High cholesterol    Hx of CABG    Hypercholesteremia    Hypertension    Localized edema    Stented coronary artery     Past Surgical History:  Procedure Laterality Date   CARDIAC SURGERY     CORONARY ARTERY BYPASS GRAFT       reports that he  quit smoking about 11 months ago. His smoking use included cigarettes. He has a 60.00 pack-year smoking history. He has never used smokeless tobacco. He reports that he does not drink alcohol and does not use drugs.  Allergies  Allergen Reactions   Lipitor [Atorvastatin]     Unknown reaction    Family History  Problem Relation Age of Onset   Hypertension Mother    Clotting disorder Mother    COPD Father     Prior to Admission medications   Medication Sig Start Date End Date Taking? Authorizing Provider  acetaminophen (TYLENOL) 500 MG tablet Take 1,000 mg by mouth every 6 (six) hours as needed.   Yes [provider]  albuterol (PROVENTIL) (2.5 MG/3ML) 0.083% nebulizer  solution Take 3 mLs (2.5 mg total) by nebulization every 6 (six) hours as needed for wheezing or shortness of breath. 07/05/21  Yes Tanda Rockers, MD  albuterol (VENTOLIN HFA) 108 (90 Base) MCG/ACT inhaler INHALE 2 PUFFS BY MOUTH EVERY 4 HOURS AS NEEDED Patient taking differently: Inhale 1-2 puffs into the lungs every 4 (four) hours as needed for wheezing or shortness of breath. 07/05/21  Yes Tanda Rockers, MD  amLODipine (NORVASC) 5 MG tablet Take 5 mg by mouth daily. 06/04/21  Yes [provider]  bisoprolol (ZEBETA) 5 MG tablet Take 1 tablet (5 mg total) by mouth 2 (two) times daily. 07/12/21  Yes Tanda Rockers, MD  budesonide-formoterol [facility]) 160-4.5 MCG/ACT inhaler Take 2 puffs first thing in am and then another 2 puffs about 12 hours later. Patient taking differently: Inhale 2 puffs into the lungs every 12 (twelve) hours. Take 2 puffs first thing in am and then another 2 puffs about 12 hours later. 10/27/20  Yes Tanda Rockers, MD  clopidogrel (PLAVIX) 75 MG tablet Take 75 mg by mouth daily.   Yes [provider]  fenofibrate micronized (LOFIBRA) 134 MG capsule Take 134 mg by mouth daily before breakfast.   Yes [provider]  montelukast (SINGULAIR) 10 MG tablet Take 10 mg by mouth at bedtime.   Yes [provider]  pantoprazole (PROTONIX) 40 MG tablet Take 1 tablet (40 mg total) by mouth 2 (two) times daily. 05/23/21  Yes Barton Dubois, MD  predniSONE (DELTASONE) 10 MG tablet 2 until better then 1 daily Patient taking differently: Take 20 mg by mouth daily. 2 until better then 1 daily 07/26/21  Yes Tanda Rockers, MD  propafenone (RYTHMOL) 225 MG tablet Take 1 tablet (225 mg total) by mouth every 8 (eight) hours. 02/03/21  Yes Josue Hector, MD  tamsulosin (FLOMAX) 0.4 MG CAPS capsule TAKE 1 CAPSULE(0.4 MG) BY MOUTH DAILY Patient taking differently: Take 0.4 mg by mouth daily. 06/30/21  Yes McKenzie, Candee Furbish, MD  torsemide (DEMADEX) 100 MG tablet  Take 50 mg by mouth daily.   Yes [provider]  torsemide (DEMADEX) 20 MG tablet Take 1 tablet (20 mg total) by mouth daily. 06/23/21 08/08/21 Yes Shahmehdi, Seyed A, MD  zinc sulfate 220 (50 Zn) MG capsule Take 1 capsule (220 mg total) by mouth daily. 05/24/21  Yes Barton Dubois, MD  ascorbic acid (VITAMIN C) 500 MG tablet Take 1 tablet (500 mg total) by mouth daily. Patient not taking: No sig reported 05/24/21   Barton Dubois, MD  guaiFENesin-dextromethorphan [facility] DM) 100-10 MG/5ML syrup Take 5 mLs by mouth every 4 (four) hours as needed for cough. Patient not taking: No sig reported 05/23/21   Barton Dubois, MD  Tiotropium Bromide Monohydrate (SPIRIVA RESPIMAT) 2.5 MCG/ACT AERS Inhale 2 puffs into the lungs daily. Patient not taking: No sig reported 07/26/21   Tanda Rockers, MD  varenicline (CHANTIX) 0.5 MG tablet Take 0.5 mg by mouth 2 (two) times daily. Patient not taking: No sig reported 04/08/21   [provider]    Physical Exam: Vitals:   08/08/21 1639 08/08/21 1645 08/08/21 1646 08/08/21 1700  BP:    (!) 107/35  Pulse: (!) 52 (!) 50 (!) 52 (!) 54  Resp: 20 (!) 24 17 (!) 27  Temp:      TempSrc:      SpO2: (!) 89% 90% 92% 93%  Weight:      Height:        Physical Exam Vitals and nursing note reviewed.  Constitutional:      General: He is not in acute distress.    Appearance: He is not toxic-appearing or diaphoretic.     Comments: Chronically ill appearing, frail elderly male  HENT:     Head: Normocephalic and atraumatic.     Nose: Nose normal. No rhinorrhea.  Eyes:     General: No scleral icterus. Cardiovascular:     Rate and Rhythm: Regular rhythm. Bradycardia present.  Pulmonary:     Effort: No respiratory distress.     Breath sounds: Examination of the right-upper field reveals rhonchi. Examination of the left-upper field reveals rhonchi. Examination of the right-lower field reveals rales. Examination of the left-lower field reveals rales.  Rhonchi and rales present.  Abdominal:     General: Bowel sounds are normal. There is no distension.     Tenderness: There is no abdominal tenderness. There is no guarding or rebound.  Musculoskeletal:     Right lower leg: 2+ Edema present.     Left lower leg: 2+ Edema present.     Right foot: Swelling present.     Left foot: Swelling present.     Comments: +2 pitting pedal edema  Skin:    General: Skin is warm and dry.  Neurological:     Mental Status: He is alert and oriented to person, place, and time.     Comments: Hard of hearing bilaterally     Labs on Admission: I have personally reviewed following labs and imaging studies  CBC: Recent Labs  Lab 08/08/21 1510  WBC 14.2*  NEUTROABS 13.0*  HGB 12.3*  HCT 40.4  MCV 103.9*  PLT 99991111   Basic Metabolic Panel: Recent Labs  Lab 08/08/21 1510  NA 141  K 4.6  CL 88*  CO2 42*  GLUCOSE 174*  BUN 35*  CREATININE 1.62*  CALCIUM 9.2   GFR: Estimated Creatinine Clearance: 32.8 mL/min (A) (by C-G formula based on SCr of 1.62 mg/dL (H)). Liver Function Tests: Recent Labs  Lab 08/08/21 1510  AST 17  ALT 19  ALKPHOS 43  BILITOT 0.9  PROT 7.0  ALBUMIN 3.9   No results for input(s): LIPASE, AMYLASE in the last 168 hours. No results for input(s): AMMONIA in the last 168 hours. Coagulation Profile: No results for input(s): INR, PROTIME in the last 168 hours. Cardiac Enzymes: No results for input(s): CKTOTAL, CKMB, CKMBINDEX, TROPONINI in the last 168 hours. BNP (last 3 results) No results for input(s): PROBNP in the last 8760 hours. HbA1C: No results for input(s): HGBA1C in the last 72 hours. CBG: No results for input(s): GLUCAP in the last 168 hours. Lipid Profile: No results for input(s): CHOL, HDL, LDLCALC, TRIG, CHOLHDL, LDLDIRECT  in the last 72 hours. Thyroid Function Tests: No results for input(s): TSH, T4TOTAL, FREET4, T3FREE, THYROIDAB in the last 72 hours. Anemia Panel: No results for input(s):  VITAMINB12, FOLATE, FERRITIN, TIBC, IRON, RETICCTPCT in the last 72 hours. Urine analysis:    Component Value Date/Time   COLORURINE YELLOW 07/17/2021 [place] 07/17/2021 1614   LABSPEC 1.015 07/17/2021 1614   PHURINE 5.0 07/17/2021 1614   GLUCOSEU NEGATIVE 07/17/2021 1614   HGBUR NEGATIVE 07/17/2021 1614   [place] 07/17/2021 [place] 07/17/2021 1614   PROTEINUR 100 (A) 07/17/2021 1614   NITRITE NEGATIVE 07/17/2021 1614   LEUKOCYTESUR NEGATIVE 07/17/2021 1614    Radiological Exams on Admission: I have personally reviewed images DG Chest Port 1 View  Result Date: 08/08/2021 CLINICAL DATA:  Cough, short of breath EXAM: PORTABLE CHEST 1 VIEW COMPARISON:  07/18/2021 FINDINGS: Single frontal view of the chest demonstrates 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 The cardiac silhouette is unremarkable. Chronic scarring throughout the lungs without airspace disease, effusion, or pneumothorax. No acute bony abnormalities. Chronic healing left humeral fracture. IMPRESSION: 1. Stable chest, no acute process. Electronically Signed   By: Randa Ngo M.D.   On: 08/08/2021 16:04    EKG: I have personally reviewed EKG: sinus bradycardia   Assessment/Plan Principal Problem:   Acute on chronic respiratory failure with hypoxia and hypercapnia (HCC) - baseline PCO2 80-90s, chronically on 2 L/min at home. Active Problems:   Chronic diastolic heart failure (HCC)   COPD exacerbation (HCC)   Diabetes mellitus ([place])   Chronic atrial fibrillation ([place]) - not on systemic anticoagulants due to patient's choice.   Chronic kidney disease, stage 3a (HCC)    COPD exacerbation (HCC) Continue with IV solumedrol, po doxycycline. duonebs q6h. Pt remains adamant that he wants to be a FULL CODE. Pt states he wants to be intubated again if he breathing gets worse.  Chronic diastolic heart failure (HCC) Not clear if pt's respiratory distress from CHF exacerbation vs  COPD exacerbation or both.  Continue with IV diuresis. Pt continues to eat a high salt diet at home that includes daily processed deli meat for lunch and frequently eats hot dogs. Pt needs nutrition consult to educate him on a low salt diet.  Repeat BNP in AM.  Diabetes mellitus ([place]) Add SSI.  Chronic atrial fibrillation (HCC) - not on systemic anticoagulants due to patient's choice. Chronic. Continue rythmol, bisoprolol. On plavix. Well documented that pt is not on systemic anticoagulants due to patient's choice.  Acute on chronic respiratory failure with hypoxia and hypercapnia (HCC) - baseline PCO2 80-90s, chronically on 2 L/min at home. Admit to med/surg bed. Continue supplemental O2. Target O2 sats 88-92%. Pt wants to be a FULL CODE and would WANT intubation for worsening respiratory failure. Pt still testing positive for covid-19 since 06-05-2021.  Chronic kidney disease, stage 3a (HCC) Chronic. Monitor Scr during diuresis.  DVT prophylaxis: Lovenox Code Status: Full Code Family Communication: no family at bedside  Disposition Plan: return home  Consults called: none  Admission status: Inpatient, Med-Surg   Kristopher Oppenheim, DO [facility] 08/08/2021, 5:39 PM

## 2021-08-08 NOTE — Subjective & Objective (Addendum)
CC: SOB HPI: 75 yo WM with hx of severe COPD, chronic hypercapnic and hypoxic respiratory failure on home O2 at 2 L minute, diabetes, chronic diastolic heart failure, bilateral hearing loss, chronic A. fib not on anticoagulation due to patient's choice, CKD stage III presents the ER today with a 2-day history of worsening shortness of breath.  Patient states that he was at a picnic on Friday.  Start developing worsening shortness of breath on Saturday.  Came to the ER on Sunday.  Patient states he is not any fevers today but did feel feverish on Saturday.  Patient has had a cough with worsening shortness of breath.  He describes his sputum is somewhat yellow.  This is quite normal for him.  His breathing difficulties have increased over the weekend.  Patient states that he lives with his stepdaughter and his demented wife.  Patient states that is not following a low-salt diet at home.  He states that when he was discharged last time to the hospital, his legs were improved as far as edema.  They started swelling about 5 days after discharge.  Patient states that he eats deli meat every day in form of a sandwich for lunch.  He also eats hot dogs several times a week.  He has never been counseled on a low-salt diet.  In the ER, patient noted to be hypoxic.  Submental O2 increased to 4 L a minute.  Chest X demonstrated COPD.  Laboratory findings include continued positive COVID-19 test.  He was also tested positive for COVID back in June 05, 2021.  ABG today pH 7.34 PCO2 of 86 PO2 of 68 on 4 L of oxygen.  white count 14.2.  BNP was elevated at 812, creatinine has increased to 1.62.  At discharge it was 1.21.  PCO2 is increased to 42.  Due to worsening hypoxia and COPD exacerbation, triad hospitalist contacted for admission.

## 2021-08-08 NOTE — Assessment & Plan Note (Addendum)
Not clear if pt's respiratory distress from CHF exacerbation vs COPD exacerbation or both.  Continue with IV diuresis. Pt continues to eat a high salt diet at home that includes daily processed deli meat for lunch and frequently eats hot dogs. Pt needs nutrition consult to educate him on a low salt diet.  Repeat BNP in AM.

## 2021-08-08 NOTE — ED Triage Notes (Signed)
Pt brought in by [facility] from home with c/o SOB. Pt has COPD. Pt was given 1 Duoneb by EMS but then pt said it made his breathing worse so he didn't want another one.

## 2021-08-08 NOTE — Assessment & Plan Note (Signed)
Add SSI. ?

## 2021-08-08 NOTE — Assessment & Plan Note (Addendum)
Chronic. Continue rythmol, bisoprolol. On plavix. Well documented that pt is not on systemic anticoagulants due to patient's choice.

## 2021-08-08 NOTE — Assessment & Plan Note (Signed)
Chronic. Monitor Scr during diuresis. 

## 2021-08-08 NOTE — ED Provider Notes (Signed)
Emergency Medicine Provider Triage Evaluation Note  Daniel Barker , a 75 y.o. male  was evaluated in triage.  Pt complains of shortness of breath, he has a history of asthma, he used to smoke cigarettes but has stopped, he has a history of multiple visits to emergency department for shortness of breath and COPD including multiple admissions to the hospital in the last couple of months.  Review of Systems  Positive: Shortness of breath, cough, swelling of the legs Negative: Fever, chills  Physical Exam  Ht 1.689 m (5' 6.5")   Wt 58 kg   SpO2 (!) 88%   BMI 20.33 kg/m  Gen:   Awake, mild to moderate respiratory distress and tachypnea Resp:  Increased work of breathing, accessory muscles, significantly decreased air movement in bilateral lung fields MSK:   Moves extremities without difficulty, however he has bilateral pitting edema Other:  Abdomen soft nontender  Medical Decision Making  Medically screening exam initiated at 2:53 PM.  Appropriate orders placed.  Daniel Barker was informed that the remainder of the evaluation will be completed by another provider, this initial triage assessment does not replace that evaluation, and the importance of remaining in the ED until their evaluation is complete.  The patient is acutely ill, will likely need multiple treatments including albuterol steroids chest x-ray and stabilizing care if he has pneumonia or pneumothorax.  X-rays ordered   Noemi Chapel, MD 08/08/21 1454

## 2021-08-08 NOTE — ED Notes (Signed)
Date and time results received: 08/08/21 1718  Test: PCO2 Critical Value: 86.1  Name of Provider Notified: EDP  Orders Received? Or Actions Taken?: EDP notified

## 2021-08-08 NOTE — ED Provider Notes (Signed)
[facility] EMERGENCY DEPARTMENT Provider Note   CSN: [id number] Arrival date & time: 08/08/21  1442     History Chief Complaint  Patient presents with   Shortness of Breath    Daniel Barker is a 75 y.o. male.  HPI 75 year old male presents with shortness of breath.  Started last night.  History of multiple prior admissions for similar type COPD exacerbations.  He states that he has been coughing up yellow sputum which is different that is clear.  He typically wears oxygen at night but was found to have O2 sats in the high 80s.  No fevers.  He has chest pain when he coughs but otherwise no chest pain.  He does state it feels a little tight.  He is also been having leg swelling that he thinks is a little worse than typical.  Past Medical History:  Diagnosis Date   Asthma    Atrial fibrillation (HCC)    BPH (benign prostatic hyperplasia)    Cigarette smoker    CKD (chronic kidney disease)    COPD (chronic obstructive pulmonary disease) (HCC)    Coronary artery disease    Cough    High cholesterol    Hx of CABG    Hypercholesteremia    Hypertension    Localized edema    Stented coronary artery     Patient Active Problem List   Diagnosis Date Noted   Acute metabolic encephalopathy 99991111   Acute respiratory failure with hypoxia and hypercarbia ([place]) 07/17/2021   COPD exacerbation ([place]) 07/17/2021   Chronic kidney disease, stage 3a ([place]) 07/03/2021   Chronic diastolic heart failure ([place]) 07/03/2021   History of fracture 07/03/2021   SOB (shortness of breath) 06/05/2021   Elevated troponin 06/05/2021   Acute on chronic respiratory failure with hypoxia and hypercapnia ([place]) 05/19/2021   COVID-19 virus infection 05/18/2021   Essential tremor 05/04/2021   Chronic atrial fibrillation ([place]) 04/25/2021   Chronic kidney disease 04/25/2021   Asthma 04/25/2021   H/O coronary angioplasty 04/25/2021   Impaired fasting glucose 04/25/2021   Cellulitis 04/25/2021   Edema  04/25/2021   Nicotine dependence 04/25/2021   Overweight 04/25/2021   Prediabetes 04/25/2021   Tremor 04/25/2021   Vitamin D deficiency 04/25/2021   Mixed hyperlipidemia 04/16/2021   Closed displaced spiral fracture of shaft of left humerus 12/23/2020   Chronic hypoxemic respiratory failure ([place]) 09/02/2020   Urinary retention 07/08/2020   Benign prostatic hyperplasia with lower urinary tract symptoms 07/08/2020   COPD GOLD IV  05/28/2020   Cigarette smoker 05/28/2020   Anemia of chronic disease 06/03/2014   Atherosclerosis of coronary artery without angina pectoris 06/03/2014   Acute respiratory failure with hypercapnia ([place]) 06/01/2014   Diabetes mellitus ([place]) 06/01/2014   Low blood pressure 06/01/2014    Past Surgical History:  Procedure Laterality Date   CARDIAC SURGERY     CORONARY ARTERY BYPASS GRAFT         Family History  Problem Relation Age of Onset   Hypertension Mother    Clotting disorder Mother    COPD Father     Social History   Tobacco Use   Smoking status: Former    Packs/day: 1.00    Years: 60.00    Pack years: 60.00    Types: Cigarettes    Quit date: 09/01/2020    Years since quitting: 0.9   Smokeless tobacco: Never  Vaping Use   Vaping Use: Never used  Substance Use Topics   Alcohol use: Never  Drug use: Never    Home Medications Prior to Admission medications   Medication Sig Start Date End Date Taking? Authorizing Provider  acetaminophen (TYLENOL) 500 MG tablet Take 1,000 mg by mouth every 6 (six) hours as needed.    [provider]  albuterol (PROVENTIL) (2.5 MG/3ML) 0.083% nebulizer solution Take 3 mLs (2.5 mg total) by nebulization every 6 (six) hours as needed for wheezing or shortness of breath. 07/05/21   Tanda Rockers, MD  albuterol (VENTOLIN HFA) 108 (90 Base) MCG/ACT inhaler INHALE 2 PUFFS BY MOUTH EVERY 4 HOURS AS NEEDED Patient taking differently: Inhale 1-2 puffs into the lungs every 4 (four) hours as needed for  wheezing or shortness of breath. 07/05/21   Tanda Rockers, MD  amLODipine (NORVASC) 5 MG tablet Take 5 mg by mouth daily. 06/04/21   [provider]  ascorbic acid (VITAMIN C) 500 MG tablet Take 1 tablet (500 mg total) by mouth daily. 05/24/21   Barton Dubois, MD  bisoprolol (ZEBETA) 5 MG tablet Take 1 tablet (5 mg total) by mouth 2 (two) times daily. 07/12/21   Tanda Rockers, MD  budesonide-formoterol (SYMBICORT) 160-4.5 MCG/ACT inhaler Take 2 puffs first thing in am and then another 2 puffs about 12 hours later. 10/27/20   Tanda Rockers, MD  clopidogrel (PLAVIX) 75 MG tablet Take 75 mg by mouth daily.    [provider]  fenofibrate micronized (LOFIBRA) 134 MG capsule Take 134 mg by mouth daily before breakfast.    [provider]  guaiFENesin-dextromethorphan (ROBITUSSIN DM) 100-10 MG/5ML syrup Take 5 mLs by mouth every 4 (four) hours as needed for cough. 05/23/21   Barton Dubois, MD  montelukast (SINGULAIR) 10 MG tablet Take 10 mg by mouth at bedtime.    [provider]  pantoprazole (PROTONIX) 40 MG tablet Take 1 tablet (40 mg total) by mouth 2 (two) times daily. 05/23/21   Barton Dubois, MD  predniSONE (DELTASONE) 10 MG tablet 2 until better then 1 daily 07/26/21   Tanda Rockers, MD  propafenone (RYTHMOL) 225 MG tablet Take 1 tablet (225 mg total) by mouth every 8 (eight) hours. 02/03/21   Josue Hector, MD  tamsulosin (FLOMAX) 0.4 MG CAPS capsule TAKE 1 CAPSULE(0.4 MG) BY MOUTH DAILY 06/30/21   McKenzie, Candee Furbish, MD  Tiotropium Bromide Monohydrate (SPIRIVA RESPIMAT) 2.5 MCG/ACT AERS Inhale 2 puffs into the lungs daily. 07/26/21   Tanda Rockers, MD  torsemide (DEMADEX) 20 MG tablet Take 1 tablet (20 mg total) by mouth daily. 06/23/21 07/23/21  Deatra James, MD  varenicline (CHANTIX) 0.5 MG tablet Take 0.5 mg by mouth 2 (two) times daily. Patient not taking: No sig reported 04/08/21   [provider]  zinc sulfate 220 (50 Zn) MG capsule Take 1  capsule (220 mg total) by mouth daily. 05/24/21   Barton Dubois, MD    Allergies    Lipitor [atorvastatin]  Review of Systems   Review of Systems  Constitutional:  Negative for fever.  Respiratory:  Positive for cough, chest tightness and shortness of breath.   Cardiovascular:  Positive for chest pain and leg swelling.  All other systems reviewed and are negative.  Physical Exam Updated Vital Signs BP (!) 152/74   Pulse (!) 53   Temp 97.7 F (36.5 C) (Oral)   Resp 15   Ht 5' 6.5" (1.689 m)   Wt 58 kg   SpO2 93%   BMI 20.33 kg/m   Physical Exam Vitals  and nursing note reviewed.  Constitutional:      Appearance: He is well-developed. He is not diaphoretic.  HENT:     Head: Normocephalic and atraumatic.     Right Ear: External ear normal.     Left Ear: External ear normal.     Nose: Nose normal.  Eyes:     General:        Right eye: No discharge.        Left eye: No discharge.  Cardiovascular:     Rate and Rhythm: Regular rhythm. Bradycardia present.     Heart sounds: Normal heart sounds.  Pulmonary:     Effort: Pulmonary effort is normal. Tachypnea present. No respiratory distress.     Breath sounds: Examination of the right-lower field reveals decreased breath sounds. Examination of the left-lower field reveals decreased breath sounds. Decreased breath sounds, wheezing and rhonchi present.  Abdominal:     Palpations: Abdomen is soft.     Tenderness: There is no abdominal tenderness.  Musculoskeletal:     Cervical back: Neck supple.     Right lower leg: Edema present.     Left lower leg: Edema present.     Comments: Pitting edema from feet to knees  Skin:    General: Skin is warm and dry.  Neurological:     Mental Status: He is alert.  Psychiatric:        Mood and Affect: Mood is not anxious.    ED Results / Procedures / Treatments   Labs (all labs ordered are listed, but only abnormal results are displayed) Labs Reviewed  RESP PANEL BY RT-PCR (FLU A&B,  COVID) ARPGX2 - Abnormal; Notable for the following components:      Result Value   SARS Coronavirus 2 by RT PCR POSITIVE (*)    All other components within normal limits  BRAIN NATRIURETIC PEPTIDE - Abnormal; Notable for the following components:   B Natriuretic Peptide 812.0 (*)    All other components within normal limits  CBC WITH DIFFERENTIAL/PLATELET - Abnormal; Notable for the following components:   WBC 14.2 (*)    RBC 3.89 (*)    Hemoglobin 12.3 (*)    MCV 103.9 (*)    Neutro Abs 13.0 (*)    Abs Immature Granulocytes 0.22 (*)    All other components within normal limits  COMPREHENSIVE METABOLIC PANEL - Abnormal; Notable for the following components:   Chloride 88 (*)    CO2 42 (*)    Glucose, Bld 174 (*)    BUN 35 (*)    Creatinine, Ser 1.62 (*)    GFR, Estimated 44 (*)    All other components within normal limits  BLOOD GAS, ARTERIAL - Abnormal; Notable for the following components:   pH, Arterial 7.349 (*)    pCO2 arterial 86.1 (*)    pO2, Arterial 68.7 (*)    Bicarbonate 41.3 (*)    Acid-Base Excess 19.7 (*)    All other components within normal limits  CBG MONITORING, ED - Abnormal; Notable for the following components:   Glucose-Capillary 212 (*)    All other components within normal limits  CBC WITH DIFFERENTIAL/PLATELET  COMPREHENSIVE METABOLIC PANEL  MAGNESIUM  HEMOGLOBIN A1C  CBC WITH DIFFERENTIAL/PLATELET  TROPONIN I (HIGH SENSITIVITY)  TROPONIN I (HIGH SENSITIVITY)    EKG EKG Interpretation  Date/Time:  Sunday August 08 2021 14:55:04 EDT Ventricular Rate:  53 PR Interval:  131 QRS Duration: 117 QT Interval:  442 QTC Calculation: 415 R Axis:  78 Text Interpretation: Sinus bradycardia Nonspecific intraventricular conduction delay Anteroseptal infarct, old similar to Aug 2022 Confirmed by Sherwood Gambler [phone]) on 08/08/2021 3:03:19 PM  Radiology DG Chest Port 1 View  Result Date: 08/08/2021 CLINICAL DATA:  Cough, short of breath EXAM:  PORTABLE CHEST 1 VIEW COMPARISON:  07/18/2021 FINDINGS: Single frontal view of the chest demonstrates 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 The cardiac silhouette is unremarkable. Chronic scarring throughout the lungs without airspace disease, effusion, or pneumothorax. No acute bony abnormalities. Chronic healing left humeral fracture. IMPRESSION: 1. Stable chest, no acute process. Electronically Signed   By: Randa Ngo M.D.   On: 08/08/2021 16:04    Procedures .Critical Care Performed by: Sherwood Gambler, MD Authorized by: Sherwood Gambler, MD   Critical care provider statement:    Critical care time (minutes):  40   Critical care time was exclusive of:  Separately billable procedures and treating other patients   Critical care was necessary to treat or prevent imminent or life-threatening deterioration of the following conditions:  Respiratory failure   Critical care was time spent personally by me on the following activities:  Discussions with consultants, evaluation of patient's response to treatment, examination of patient, ordering and performing treatments and interventions, ordering and review of laboratory studies, ordering and review of radiographic studies, pulse oximetry, re-evaluation of patient's condition, obtaining history from patient or surrogate and review of old charts   Medications Ordered in ED Medications  albuterol (PROVENTIL,VENTOLIN) solution continuous neb (10 mg/hr Nebulization New Bag/Given 08/08/21 1458)  magnesium sulfate IVPB 2 g 50 mL (2 g Intravenous New Bag/Given 08/08/21 1507)  methylPREDNISolone sodium succinate (SOLU-MEDROL) 125 mg/2 mL injection 125 mg (125 mg Intravenous Given 08/08/21 1501)    ED Course  I have reviewed the triage vital signs and the nursing notes.  Pertinent labs & imaging results that were available during my care of the patient were reviewed by me and considered in my medical decision making (see chart for details).    MDM  Rules/Calculators/A&P                           Patient with acute dyspnea/hypoxia. Likely mix of COPD (mostly) and CHF. Given IV lasix. Given continuous albuterol,mag, solumedrol. Feeling better but now more hypoxic. Will also give antibiotics. Has elevated CO2 but is compensated, and no AMS so I don't think he needs bipap/intubation. Will need further COPD care. He was tested for covid, but upon further review he tested positive a little under 3 months ago. Unclear if this is new or residual positive. Hospitalist to admit Final Clinical Impression(s) / ED Diagnoses Final diagnoses:  COPD exacerbation [facility])    Rx / DC Orders ED Discharge Orders     None        Sherwood Gambler, MD 08/09/21 [phone]

## 2021-08-08 NOTE — Assessment & Plan Note (Addendum)
Admit to med/surg bed. Continue supplemental O2. Target O2 sats 88-92%. Pt wants to be a FULL CODE and would WANT intubation for worsening respiratory failure. Pt still testing positive for covid-19 since 06-05-2021.

## 2021-08-09 ENCOUNTER — Encounter (HOSPITAL_COMMUNITY): Payer: Self-pay | Admitting: Internal Medicine

## 2021-08-09 DIAGNOSIS — U071 COVID-19: Principal | ICD-10-CM

## 2021-08-09 DIAGNOSIS — Z515 Encounter for palliative care: Secondary | ICD-10-CM

## 2021-08-09 DIAGNOSIS — J9601 Acute respiratory failure with hypoxia: Secondary | ICD-10-CM | POA: Diagnosis present

## 2021-08-09 DIAGNOSIS — J9621 Acute and chronic respiratory failure with hypoxia: Secondary | ICD-10-CM

## 2021-08-09 DIAGNOSIS — J9622 Acute and chronic respiratory failure with hypercapnia: Secondary | ICD-10-CM

## 2021-08-09 DIAGNOSIS — Z7189 Other specified counseling: Secondary | ICD-10-CM

## 2021-08-09 DIAGNOSIS — J441 Chronic obstructive pulmonary disease with (acute) exacerbation: Secondary | ICD-10-CM

## 2021-08-09 LAB — CBC WITH DIFFERENTIAL/PLATELET
Abs Immature Granulocytes: 0.18 10*3/uL — ABNORMAL HIGH (ref 0.00–0.07)
Basophils Absolute: 0 10*3/uL (ref 0.0–0.1)
Basophils Relative: 0 %
Eosinophils Absolute: 0 10*3/uL (ref 0.0–0.5)
Eosinophils Relative: 0 %
HCT: 36.6 % — ABNORMAL LOW (ref 39.0–52.0)
Hemoglobin: 11.4 g/dL — ABNORMAL LOW (ref 13.0–17.0)
Immature Granulocytes: 2 %
Lymphocytes Relative: 7 %
Lymphs Abs: 0.7 10*3/uL (ref 0.7–4.0)
MCH: 32.2 pg (ref 26.0–34.0)
MCHC: 31.1 g/dL (ref 30.0–36.0)
MCV: 103.4 fL — ABNORMAL HIGH (ref 80.0–100.0)
Monocytes Absolute: 0.3 10*3/uL (ref 0.1–1.0)
Monocytes Relative: 3 %
Neutro Abs: 8.2 10*3/uL — ABNORMAL HIGH (ref 1.7–7.7)
Neutrophils Relative %: 88 %
Platelets: 160 10*3/uL (ref 150–400)
RBC: 3.54 MIL/uL — ABNORMAL LOW (ref 4.22–5.81)
RDW: 14.4 % (ref 11.5–15.5)
WBC: 9.4 10*3/uL (ref 4.0–10.5)
nRBC: 0 % (ref 0.0–0.2)

## 2021-08-09 LAB — HEMOGLOBIN A1C
Hgb A1c MFr Bld: 5.9 % — ABNORMAL HIGH (ref 4.8–5.6)
Mean Plasma Glucose: 122.63 mg/dL

## 2021-08-09 LAB — COMPREHENSIVE METABOLIC PANEL WITH GFR
ALT: 18 U/L (ref 0–44)
AST: 14 U/L — ABNORMAL LOW (ref 15–41)
Albumin: 3.2 g/dL — ABNORMAL LOW (ref 3.5–5.0)
Alkaline Phosphatase: 37 U/L — ABNORMAL LOW (ref 38–126)
Anion gap: 11 (ref 5–15)
BUN: 36 mg/dL — ABNORMAL HIGH (ref 8–23)
CO2: 46 mmol/L — ABNORMAL HIGH (ref 22–32)
Calcium: 8.9 mg/dL (ref 8.9–10.3)
Chloride: 87 mmol/L — ABNORMAL LOW (ref 98–111)
Creatinine, Ser: 1.58 mg/dL — ABNORMAL HIGH (ref 0.61–1.24)
GFR, Estimated: 46 mL/min — ABNORMAL LOW
Glucose, Bld: 178 mg/dL — ABNORMAL HIGH (ref 70–99)
Potassium: 4.5 mmol/L (ref 3.5–5.1)
Sodium: 144 mmol/L (ref 135–145)
Total Bilirubin: 0.9 mg/dL (ref 0.3–1.2)
Total Protein: 5.7 g/dL — ABNORMAL LOW (ref 6.5–8.1)

## 2021-08-09 LAB — BLOOD GAS, VENOUS
Acid-Base Excess: 23.2 mmol/L — ABNORMAL HIGH (ref 0.0–2.0)
Bicarbonate: 42.8 mmol/L — ABNORMAL HIGH (ref 20.0–28.0)
FIO2: 32
O2 Saturation: 54.1 %
Patient temperature: 36.5
pCO2, Ven: 104 mmHg (ref 44.0–60.0)
pH, Ven: 7.308 (ref 7.250–7.430)
pO2, Ven: 34.7 mmHg (ref 32.0–45.0)

## 2021-08-09 LAB — CBG MONITORING, ED
Glucose-Capillary: 109 mg/dL — ABNORMAL HIGH (ref 70–99)
Glucose-Capillary: 111 mg/dL — ABNORMAL HIGH (ref 70–99)
Glucose-Capillary: 124 mg/dL — ABNORMAL HIGH (ref 70–99)
Glucose-Capillary: 191 mg/dL — ABNORMAL HIGH (ref 70–99)
Glucose-Capillary: 212 mg/dL — ABNORMAL HIGH (ref 70–99)

## 2021-08-09 LAB — MAGNESIUM: Magnesium: 2.4 mg/dL (ref 1.7–2.4)

## 2021-08-09 MED ORDER — FUROSEMIDE 10 MG/ML IJ SOLN
40.0000 mg | Freq: Two times a day (BID) | INTRAMUSCULAR | Status: DC
  Administered 2021-08-09 – 2021-08-10 (×2): 40 mg via INTRAVENOUS
  Filled 2021-08-09 (×2): qty 4

## 2021-08-09 MED ORDER — REMDESIVIR 100 MG IV SOLR
100.0000 mg | INTRAVENOUS | Status: AC
  Administered 2021-08-09 (×2): 100 mg via INTRAVENOUS
  Filled 2021-08-09 (×2): qty 20

## 2021-08-09 MED ORDER — SODIUM CHLORIDE 0.9 % IV SOLN
100.0000 mg | Freq: Every day | INTRAVENOUS | Status: DC
  Administered 2021-08-10 – 2021-08-12 (×3): 100 mg via INTRAVENOUS
  Filled 2021-08-09 (×3): qty 20
  Filled 2021-08-09: qty 100
  Filled 2021-08-09: qty 20

## 2021-08-09 NOTE — Progress Notes (Addendum)
PROGRESS NOTE    Joran Ulin  [id number] DOB: 01/01/1946 DOA: 08/08/2021 PCP: [name], MD   Brief Narrative:   75 yo WM with hx of severe COPD, chronic hypercapnic and hypoxic respiratory failure on home O2 at 2 L minute, diabetes, chronic diastolic heart failure, bilateral hearing loss, chronic A. fib not on anticoagulation due to patient's choice, CKD stage III presents the ER today with a 2-day history of worsening shortness of breath.  Patient was admitted with acute mixed respiratory failure in the setting of COPD as well as CHF exacerbation.  Assessment & Plan:   Principal Problem:   Acute on chronic respiratory failure with hypoxia and hypercapnia (HCC) - baseline PCO2 80-90s, chronically on 2 L/min at home. Active Problems:   Diabetes mellitus ([place])   Chronic atrial fibrillation (HCC) - not on systemic anticoagulants due to patient's choice.   Chronic kidney disease, stage 3a (HCC)   Chronic diastolic heart failure (HCC)   COPD exacerbation (HCC)   Acute on chronic hypoxemic and hypercapnic respiratory failure-multifactorial -PCO2 is upward trending this a.m. with ongoing dyspnea, but baseline appears to be near 80-90 -Appreciate pulmonology evaluation -Appreciate palliative evaluation for goals of care -Secondary to COPD exacerbation as well as chronic diastolic heart failure, see below  COVID-19 pneumonia -Plan to add remdesivir -Maintain on isolation  Acute COPD exacerbation -Continue IV Solu-Medrol, p.o. doxycycline, DuoNebs every 6 hours -Follow ABG as appropriate  Chronic diastolic heart failure -Patient has a high salt diet at home -Continue IV diuresis  Type 2 diabetes with mild hyperglycemia -Likely steroid-induced -SSI  CKD stage IIIa -Continue to monitor with ongoing diuresis  DVT prophylaxis: Lovenox Code Status: Full Family Communication: Discussed with daughter on phone Disposition Plan:  Status is: Inpatient  Remains inpatient  appropriate because:IV treatments appropriate due to intensity of illness or inability to take PO and Inpatient level of care appropriate due to severity of illness  Dispo: The patient is from: Home              Anticipated d/c is to: Home              Patient currently is not medically stable to d/c.   Difficult to place patient No   Consultants:  PCCM Palliative care  Procedures:  See below  Antimicrobials:  Anti-infectives (From admission, onward)    Start     Dose/Rate Route Frequency Ordered Stop   08/09/21 1000  doxycycline (VIBRA-TABS) tablet 100 mg        100 mg Oral Every 12 hours 08/08/21 1819     08/08/21 1715  doxycycline (VIBRA-TABS) tablet 100 mg        100 mg Oral  Once 08/08/21 1701 08/08/21 1801       Subjective: Patient seen and evaluated today with some ongoing dyspnea and difficulty finishing sentences.  He is noted to have more tachypnea.  Objective: Vitals:   08/09/21 0600 08/09/21 0800 08/09/21 0900 08/09/21 0913  BP: (!) 116/58 (!) 145/59 (!) 98/56   Pulse: (!) 27 (!) 46 (!) 45   Resp: 18  (!) 24   Temp:      TempSrc:      SpO2: 97% 97% 90% 96%  Weight:      Height:        Intake/Output Summary (Last 24 hours) at 08/09/2021 1032 Last data filed at 08/09/2021 0600 Gross per 24 hour  Intake --  Output 400 ml  Net -400 ml   Danley Danker  Weights   08/08/21 1451  Weight: 58 kg    Examination:  General exam: Appears calm and comfortable  Respiratory system: Diminished bilaterally. Respiratory effort normal.  Currently on 3 L nasal cannula Cardiovascular system: S1 & S2 heard, RRR.  Gastrointestinal system: Abdomen is soft Central nervous system: Alert and awake Extremities: No edema Skin: No significant lesions noted Psychiatry: Flat affect.    Data Reviewed: I have personally reviewed following labs and imaging studies  CBC: Recent Labs  Lab 08/08/21 1510 08/09/21 0505  WBC 14.2* 9.4  NEUTROABS 13.0* 8.2*  HGB 12.3* 11.4*  HCT  40.4 36.6*  MCV 103.9* 103.4*  PLT 180 0000000   Basic Metabolic Panel: Recent Labs  Lab 08/08/21 1510 08/09/21 0505  NA 141 144  K 4.6 4.5  CL 88* 87*  CO2 42* 46*  GLUCOSE 174* 178*  BUN 35* 36*  CREATININE 1.62* 1.58*  CALCIUM 9.2 8.9  MG  --  2.4   GFR: Estimated Creatinine Clearance: 33.6 mL/min (A) (by C-G formula based on SCr of 1.58 mg/dL (H)). Liver Function Tests: Recent Labs  Lab 08/08/21 1510 08/09/21 0505  AST 17 14*  ALT 19 18  ALKPHOS 43 37*  BILITOT 0.9 0.9  PROT 7.0 5.7*  ALBUMIN 3.9 3.2*   No results for input(s): LIPASE, AMYLASE in the last 168 hours. No results for input(s): AMMONIA in the last 168 hours. Coagulation Profile: No results for input(s): INR, PROTIME in the last 168 hours. Cardiac Enzymes: No results for input(s): CKTOTAL, CKMB, CKMBINDEX, TROPONINI in the last 168 hours. BNP (last 3 results) No results for input(s): PROBNP in the last 8760 hours. HbA1C: Recent Labs    08/08/21 1510  HGBA1C 5.9*   CBG: Recent Labs  Lab 08/09/21 0009 08/09/21 0748  GLUCAP 212* 124*   Lipid Profile: No results for input(s): CHOL, HDL, LDLCALC, TRIG, CHOLHDL, LDLDIRECT in the last 72 hours. Thyroid Function Tests: No results for input(s): TSH, T4TOTAL, FREET4, T3FREE, THYROIDAB in the last 72 hours. Anemia Panel: No results for input(s): VITAMINB12, FOLATE, FERRITIN, TIBC, IRON, RETICCTPCT in the last 72 hours. Sepsis Labs: No results for input(s): PROCALCITON, LATICACIDVEN in the last 168 hours.  Recent Results (from the past 240 hour(s))  Resp Panel by RT-PCR (Flu A&B, Covid) Nasopharyngeal Swab     Status: Abnormal   Collection Time: 08/08/21  3:00 PM   Specimen: Nasopharyngeal Swab; Nasopharyngeal(NP) swabs in vial transport medium  Result Value Ref Range Status   SARS Coronavirus 2 by RT PCR POSITIVE (A) NEGATIVE Final    Comment: CRITICAL RESULT CALLED TO, READ BACK BY AND VERIFIED WITH: Sharyon Cable 1624 08/08/2021  COLEMAN,R (NOTE) SARS-CoV-2 target nucleic acids are DETECTED.  The SARS-CoV-2 RNA is generally detectable in upper respiratory specimens during the acute phase of infection. Positive results are indicative of the presence of the identified virus, but do not rule out bacterial infection or co-infection with other pathogens not detected by the test. Clinical correlation with patient history and other diagnostic information is necessary to determine patient infection status. The expected result is Negative.  Fact Sheet for Patients: EntrepreneurPulse.com.au  Fact Sheet for Healthcare Providers: IncredibleEmployment.be  This test is not yet approved or cleared by the [place] FDA and  has been authorized for detection and/or diagnosis of SARS-CoV-2 by FDA under an Emergency Use Authorization (EUA).  This EUA will remain in effect (meaning this t est can be used) for the duration of  the COVID-19 declaration under Section 564(b)(1)  of the Act, 21 U.S.C. section 360bbb-3(b)(1), unless the authorization is terminated or revoked sooner.     Influenza A by PCR NEGATIVE NEGATIVE Final   Influenza B by PCR NEGATIVE NEGATIVE Final    Comment: (NOTE) The Xpert Xpress SARS-CoV-2/FLU/RSV plus assay is intended as an aid in the diagnosis of influenza from Nasopharyngeal swab specimens and should not be used as a sole basis for treatment. Nasal washings and aspirates are unacceptable for Xpert Xpress SARS-CoV-2/FLU/RSV testing.  Fact Sheet for Patients: EntrepreneurPulse.com.au  Fact Sheet for Healthcare Providers: IncredibleEmployment.be  This test is not yet approved or cleared by the [place] FDA and has been authorized for detection and/or diagnosis of SARS-CoV-2 by FDA under an Emergency Use Authorization (EUA). This EUA will remain in effect (meaning this test can be used) for the duration of  the COVID-19 declaration under Section 564(b)(1) of the Act, 21 U.S.C. section 360bbb-3(b)(1), unless the authorization is terminated or revoked.  Performed at [facility], [address], [place], [place] [address]          Radiology Studies: [facility] Chest [facility] 1 View  Result Date: 08/08/2021 CLINICAL DATA:  Cough, short of breath EXAM: PORTABLE CHEST 1 VIEW COMPARISON:  07/18/2021 FINDINGS: Single frontal view of the chest demonstrates 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 The cardiac silhouette is unremarkable. Chronic scarring throughout the lungs without airspace disease, effusion, or pneumothorax. No acute bony abnormalities. Chronic healing left humeral fracture. IMPRESSION: 1. Stable chest, no acute process. Electronically Signed   By: Randa Ngo M.D.   On: 08/08/2021 16:04        Scheduled Meds:  clopidogrel  75 mg Oral Daily   doxycycline  100 mg Oral Q12H   enoxaparin (LOVENOX) injection  40 mg Subcutaneous Q24H   furosemide  40 mg Intravenous Q12H   guaiFENesin  600 mg Oral BID   insulin aspart  0-15 Units Subcutaneous TID WC   insulin aspart  0-5 Units Subcutaneous QHS   ipratropium-albuterol  3 mL Nebulization Q6H   methylPREDNISolone (SOLU-MEDROL) injection  90 mg Intravenous Q12H   montelukast  10 mg Oral QHS   pantoprazole  40 mg Oral BID   propafenone  225 mg Oral Q8H   tamsulosin  0.4 mg Oral Daily     LOS: 1 day    Critical care time spent: 40 minutes    Enes Rokosz Darleen Crocker, DO [facility]  If 7PM-7AM, please contact night-coverage [email or web address] 08/09/2021, 10:32 AM

## 2021-08-09 NOTE — ED Notes (Signed)
Bi-pap removed at this time and patient placed on oxygen via N/C per order. Tolerating well.

## 2021-08-09 NOTE — ED Notes (Signed)
In room to see if patient could tolerate removal of Bipap for medication admin and patient states that he doesn't want it taken off right now.

## 2021-08-09 NOTE — ED Notes (Signed)
Palliative care NP in room to speak with patient at this time. Patient has repeatedly been on call-light asking to have Bi-pap removed at this time. Explained multiple times purpose.

## 2021-08-09 NOTE — Consult Note (Signed)
Consultation Note Date: 08/09/2021   Patient Name: Daniel Barker  DOB: [birth year]  MRN: [id number]  Age / Sex: 75 y.o., male  PCP: Daniel Squibb, MD Referring Physician: Rodena Goldmann, DO  Reason for Consultation: Establishing goals of care  HPI/Patient Profile: 75 y.o. male  with past medical history of COPD on 2L at home, diastolic heart failure, atrial fibrillation, diabetes, bilateral hearing loss, CKD stage 2 admitted on 08/08/2021 with worsening shortness of breath with concern for COPD exacerbation and heart failure exacerbation. Seen by palliative care Daniel Axe, NP August 2022 and noted goals for full code/full scope but no tracheostomy (also notes if he failed extubation he would not want reintubation at that time) with step-daughter Daniel Barker stated to be surrogate decision maker (wife with dementia). Plans were made for outpatient palliative care to follow.   Clinical Assessment and Goals of Care: I met today at [name]'s bedside. He is currently on BiPAP and frustrated with BiPAP mask and reporting that he wants this off and wants to eat and drink. I removed BiPAP for a minute and provided him with a sip of water. During this break I was able to speak with Daniel Barker about his care. He understands that he tested positive for COVID and I explained there is concern for fluid build up from heart weakness and COPD flare. I explained the purpose of the BiPAP and agrees to continue to use BiPAP but wants to ensure that he is able to get a break later today. I talk with Daniel Barker more about concern for his lungs and weakness and I know he recently had a difficult hospital stay in which he required intubation. Daniel Barker confirms continued desire for full code and intubation "if absolutely necessary." He is hopeful to avoid if at all possible. He confirms that he would not want to be kept alive long term  on ventilator and would not want support continued if not improving. He tells me that his daughter, Daniel Barker, is aware of everything. He gives me permission to speak with Daniel Barker.   I call and speak with Daniel Barker who confirms the above stated goals as consistent with what he has told her. Daniel Barker shares that they all know that if he needs to go back on ventilator he is unlikely to come off successfully. Daniel Barker fears having to make a decision to stop ventilator support. I tried to assure Daniel Barker that if it comes to this that we will be able to support her and guide her when the time is right based on his wishes making it his decision and not hers. Daniel Barker still feels the weight of the situation. Daniel Barker is open to further conversation regarding goals of care and if this is what he really wants. She would like for his wishes to be documented clearly so that it is not her decision to make. Daniel Barker is upset that she is not able to be at his bedside as easily now that he has tested positive for COVID but  plans to visit once he is admitted to inpatient room and out of ED. I would like to have further conversation with Daniel Barker once able to come off BiPAP and ideally with Daniel Barker as well.   All questions/concerns addressed. Emotional support provided.   Primary Decision Maker PATIENT with assistance from Daniel Barker    SUMMARY OF RECOMMENDATIONS   - Full code - No long term vent and do not continue if not improving (would not want reintubation after failed extubation)  Code Status/Advance Care Planning: Full code   Symptom Management:  Per attending and PCCM.   Palliative Prophylaxis:  Aspiration, Delirium Protocol, Frequent Pain Assessment, Oral Care, and Turn Reposition  Additional Recommendations (Limitations, Scope, Preferences): Full Scope Treatment  Psycho-social/Spiritual:  Desire for further Chaplaincy support:yes Additional Recommendations: Caregiving  Support/Resources and  Grief/Bereavement Support  Prognosis:  Overall prognosis poor with ongoing respiratory decline.   Discharge Planning: To Be Determined      Primary Diagnoses: Present on Admission:  Acute on chronic respiratory failure with hypoxia and hypercapnia (HCC) - baseline PCO2 80-90s, chronically on 2 L/min at home.  Chronic atrial fibrillation (HCC) - not on systemic anticoagulants due to patient's choice.  Chronic kidney disease, stage 3a (HCC)  COPD exacerbation (HCC)  Chronic diastolic heart failure (HCC)   I have reviewed the medical record, interviewed the patient and family, and examined the patient. The following aspects are pertinent.  Past Medical History:  Diagnosis Date   Acute metabolic encephalopathy 07/18/2021   Asthma    Atrial fibrillation (HCC)    BPH (benign prostatic hyperplasia)    Cigarette smoker    CKD (chronic kidney disease)    COPD (chronic obstructive pulmonary disease) (HCC)    Coronary artery disease    Cough    High cholesterol    Hx of CABG    Hypercholesteremia    Hypertension    Localized edema    Stented coronary artery    Social History   Socioeconomic History   Marital status: Married    Spouse name: Daniel Barker   Number of children: 3   Years of education: high school   Highest education level: Not on file  Occupational History   Not on file  Tobacco Use   Smoking status: Former    Packs/day: 1.00    Years: 60.00    Pack years: 60.00    Types: Cigarettes    Quit date: 09/01/2020    Years since quitting: 0.9   Smokeless tobacco: Never  Vaping Use   Vaping Use: Never used  Substance and Sexual Activity   Alcohol use: Never   Drug use: Never   Sexual activity: Not on file  Other Topics Concern   Not on file  Social History Narrative   Lives with step daughter, Daniel Barker who moved them from [facility] [place] to    Uses [employer] for Oxygen   He is hard of hearing and needs eye surgery   Social Determinants of Health   Financial  Resource Strain: Low Risk    Difficulty of Paying Living Expenses: Not hard at all  Food Insecurity: No Food Insecurity   Worried About Programme researcher, broadcasting/film/video in the Last Year: Never true   Ran Out of Food in the Last Year: Never true  Transportation Needs: No Transportation Needs   Lack of Transportation (Medical): No   Lack of Transportation (Non-Medical): No  Physical Activity: Not on file  Stress: No Stress Concern Present  Feeling of Stress : Only a little  Social Connections: Unknown   Frequency of Communication with Friends and Family: More than three times a week   Frequency of Social Gatherings with Friends and Family: More than three times a week   Attends Religious Services: Patient refused   Active Member of Clubs or Organizations: Patient refused   Attends Music therapist: Patient refused   Marital Status: Married   Family History  Problem Relation Age of Onset   Hypertension Mother    Clotting disorder Mother    COPD Father    Scheduled Meds:  bisoprolol  5 mg Oral BID   clopidogrel  75 mg Oral Daily   doxycycline  100 mg Oral Q12H   enoxaparin (LOVENOX) injection  40 mg Subcutaneous Q24H   furosemide  40 mg Intravenous Q8H   guaiFENesin  600 mg Oral BID   insulin aspart  0-15 Units Subcutaneous TID WC   insulin aspart  0-5 Units Subcutaneous QHS   ipratropium-albuterol  3 mL Nebulization Q6H   methylPREDNISolone (SOLU-MEDROL) injection  90 mg Intravenous Q12H   montelukast  10 mg Oral QHS   pantoprazole  40 mg Oral BID   propafenone  225 mg Oral Q8H   tamsulosin  0.4 mg Oral Daily   Continuous Infusions: PRN Meds:.acetaminophen **OR** acetaminophen, ondansetron **OR** ondansetron (ZOFRAN) IV Allergies  Allergen Reactions   Lipitor [Atorvastatin]     Unknown reaction   Review of Systems  Constitutional:  Positive for activity change.  Respiratory:  Positive for shortness of breath.   Psychiatric/Behavioral:  The patient is nervous/anxious.     Physical Exam Vitals and nursing note reviewed.  Constitutional:      General: He is awake.     Appearance: He is ill-appearing.  Cardiovascular:     Rate and Rhythm: Bradycardia present.  Pulmonary:     Effort: Tachypnea and accessory muscle usage present.     Comments: Better with BiPAP Abdominal:     General: Abdomen is flat.     Palpations: Abdomen is soft.  Neurological:     Mental Status: He is alert.     Comments: Seems mostly oriented but forgetful    Vital Signs: BP (!) 98/56   Pulse (!) 45   Temp 97.7 F (36.5 C) (Oral)   Resp (!) 24   Ht 5' 6.5" (1.689 m)   Wt 58 kg   SpO2 96%   BMI 20.33 kg/m  Pain Scale: 0-10   Pain Score: 0-No pain   SpO2: SpO2: 96 % O2 Device:SpO2: 96 % O2 Flow Rate: .O2 Flow Rate (L/min): 3 L/min  IO: Intake/output summary:  Intake/Output Summary (Last 24 hours) at 08/09/2021 0958 Last data filed at 08/09/2021 0600 Gross per 24 hour  Intake --  Output 400 ml  Net -400 ml    LBM:   Baseline Weight: Weight: 58 kg Most recent weight: Weight: 58 kg     Palliative Assessment/Data:    Time Total: 55 min  Greater than 50%  of this time was spent counseling and coordinating care related to the above assessment and plan.  Signed by: Vinie Sill, NP Palliative Medicine Team Pager # [phone] (M-F 8a-5p) Team Phone # [phone] (Nights/Weekends)

## 2021-08-09 NOTE — TOC Initial Note (Signed)
Transition of Care [facility]) - Initial/Assessment Note    Patient Details  Name: Daniel Barker MRN: [id number] Date of Birth: August 18, 1946  Transition of Care [facility]) CM/SW Contact:    Salome Arnt, [place] Phone Number: 08/09/2021, 8:24 AM  Clinical Narrative:  Pt admitted with COPD exacerbation. TOC completed assessment due to high risk readmission score. Pt reports he lives with his wife who has dementia and his step-daughter. He indicates he is independent with ADLs and drives himself to appointments. Pt requires 2L O2 at night- he is unsure what agency.  Pt is still testing positive for COVID since July. He refused home health during admission last month, but was agreeable to referral to [facility]. No needs reported at this time. TOC will continue to follow.                Expected Discharge Plan: Home/Self Care Barriers to Discharge: Continued Medical Work up   Patient Goals and CMS Choice Patient states their goals for this hospitalization and ongoing recovery are:: return home   Choice offered to / list presented to : Patient  Expected Discharge Plan and Services Expected Discharge Plan: Home/Self Care In-house Referral: Clinical Social Work     Living arrangements for the past 2 months: Single Family Home                 DME Arranged: N/A                    Prior Living Arrangements/Services Living arrangements for the past 2 months: Single Family Home Lives with:: Adult Children, Spouse Patient language and need for interpreter reviewed:: Yes Do you feel safe going back to the place where you live?: Yes          Current home services: DME ([facility], [facility], O[facility]) Criminal Activity/Legal Involvement Pertinent to Current Situation/Hospitalization: No - Comment as needed  Activities of Daily Living      Permission Sought/Granted                  Emotional Assessment   Attitude/Demeanor/Rapport: Engaged Affect (typically observed):  Accepting Orientation: : Oriented to Place, Oriented to Self, Oriented to  Time, Oriented to Situation Alcohol / Substance Use: Not Applicable Psych Involvement: No (comment)  Admission diagnosis:  Acute on chronic respiratory failure with hypoxia and hypercapnia (HCC) [id number], J96.22] Patient Active Problem List   Diagnosis Date Noted   Acute respiratory failure with hypoxia and hypercarbia (HCC) 07/17/2021   COPD exacerbation ([place]) 07/17/2021   Chronic kidney disease, stage 3a ([place]) 07/03/2021   Chronic diastolic heart failure ([place]) 07/03/2021   History of fracture 07/03/2021   SOB (shortness of breath) 06/05/2021   Elevated troponin 06/05/2021   Acute on chronic respiratory failure with hypoxia and hypercapnia (HCC) - baseline PCO2 80-90s, chronically on 2 L/min at home. 05/19/2021   COVID-19 virus infection 05/18/2021   Essential tremor 05/04/2021   Chronic atrial fibrillation (HCC) - not on systemic anticoagulants due to patient's choice. 04/25/2021   Chronic kidney disease 04/25/2021   Asthma 04/25/2021   H/O coronary angioplasty 04/25/2021   Impaired fasting glucose 04/25/2021   Cellulitis 04/25/2021   Edema 04/25/2021   Nicotine dependence 04/25/2021   Overweight 04/25/2021   Prediabetes 04/25/2021   Tremor 04/25/2021   Vitamin D deficiency 04/25/2021   Mixed hyperlipidemia 04/16/2021   Closed displaced spiral fracture of shaft of left humerus 12/23/2020   Chronic hypoxemic respiratory failure ([place]) 09/02/2020   Urinary retention  07/08/2020   Benign prostatic hyperplasia with lower urinary tract symptoms 07/08/2020   COPD GOLD IV  05/28/2020   Cigarette smoker 05/28/2020   Anemia of chronic disease 06/03/2014   Atherosclerosis of coronary artery without angina pectoris 06/03/2014   Acute respiratory failure with hypercapnia ([place]) 06/01/2014   Diabetes mellitus ([place]) 06/01/2014   Low blood pressure 06/01/2014   PCP:  [name], MD Pharmacy:   [facility]  [place], [place] AT [place] [id number] [place] [id number] Phone: [phone] Fax: [phone]     Social Determinants of Health (SDOH) Interventions    Readmission Risk Interventions Readmission Risk Prevention Plan 08/09/2021 07/20/2021 06/22/2021  Transportation Screening Complete Complete Complete  PCP or Specialist Appt within 5-7 Days - - -  Home Care Screening - - -  Medication Review ([place]) Complete Complete Complete  HRI or Home Care Consult Complete Complete Complete  SW Recovery Care/Counseling Consult Complete - Complete  [place] Not Applicable - Not Applicable

## 2021-08-09 NOTE — Consult Note (Signed)
[place] [facility]   Patient name: Daniel Barker Admit date: 08/08/2021  DOB: 1946/11/28 LOS: 1  MRN: [id number] Consult date: 08/09/2021  Referring provider: Dr. Heath Lark, [facility] CC: Short of breath    History:  75 yo male former smoker with severe COPD and respiratory failure with frequent hospital admissions presented to [facility] on 08/08/21 with shortness of breath.  He has been getting headache, cough, and leg swelling.  No fever, sputum, or wheeze.  Found to have acute on chronic hypoxia/hypercapnia and positive for COVID 19 infection.  Started on steroids, antibiotics, remdesivir and Bipap.  Past medical history:  COPD on home oxygen, DM type 2, Diastolic CHF, Hearing loss, A fib, CKD 3a  Significant events:  9/11 Admit  Studies:    Micro:  COVID 9/11 >> Positive  Lines:     Antibiotics:  Doxycycline 9/11 >> Remdesivir 9/12 >>   Consults:  Palliative care    Interim history:    Vital signs:  BP 121/66   Pulse (!) 54   Temp 97.7 F (36.5 C) (Oral)   Resp (!) 22   Ht 5' 6.5" (1.689 m)   Wt 58 kg   SpO2 97%   BMI 20.33 kg/m   Intake/output:  I/O last 3 completed shifts: In: -  Out: 400 [Urine:400]   Physical exam:   General - alert Eyes - pupils reactive ENT - no sinus tenderness, no stridor Cardiac - regular rate/rhythm, no murmur Chest - decreased breath sounds b/l, no wheezing or rales Abdomen - soft, non tender, + bowel sounds Extremities - 1+ edema Skin - no rashes Neuro - normal strength, moves extremities, follows commands Psych - normal mood and behavior Lymphatics - no lymphadenopathy     Best practice:   DVT - lovenox SUP - protonix Nutrition - carb mod/heart healthy Mobility - bed rest   Assessment/plan:   Acute on chronic hypoxic/hypercapnic respiratory failure from COPD exacerbation secondary to COVID 19 infection. - day 2 of solumedrol and doxycycline, day 1 of  remedesivir - goal SpO2 90 to 95% - conitnue duoneb, singulair - Bipap prn  - f/u CXR intermittently - don't think he needs tocilizumab or barcitinib at this time  History of chronic diastolic dysfx, atrial fibrillation. - continue plavix, rhythmol, lasix  DM type 2 poorly controlled with steroid induced hyperglycemia. - SSI  CKD 3a. - f/u BMET  Goals of care. - palliative care consulted  Resolved hospital problems:    Goals of care/Family discussions:  Code status: full code  Labs:   CMP Latest Ref Rng & Units 08/09/2021 08/08/2021 07/19/2021  Glucose 70 - 99 mg/dL 178(H) 174(H) 148(H)  BUN 8 - 23 mg/dL 36(H) 35(H) 25(H)  Creatinine 0.61 - 1.24 mg/dL 1.58(H) 1.62(H) 1.21  Sodium 135 - 145 mmol/L 144 141 142  Potassium 3.5 - 5.1 mmol/L 4.5 4.6 4.1  Chloride 98 - 111 mmol/L 87(L) 88(L) 98  CO2 22 - 32 mmol/L 46(H) 42(H) 36(H)  Calcium 8.9 - 10.3 mg/dL 8.9 9.2 8.9  Total Protein 6.5 - 8.1 g/dL 5.7(L) 7.0 -  Total Bilirubin 0.3 - 1.2 mg/dL 0.9 0.9 -  Alkaline Phos 38 - 126 U/L 37(L) 43 -  AST 15 - 41 U/L 14(L) 17 -  ALT 0 - 44 U/L 18 19 -    CBC Latest Ref Rng & Units 08/09/2021 08/08/2021 07/19/2021  WBC 4.0 - 10.5 K/uL 9.4 14.2(H) 11.6(H)  Hemoglobin 13.0 - 17.0 g/dL 11.4(L) 12.3(L) 10.0(L)  Hematocrit 39.0 - 52.0 % 36.6(L) 40.4 31.8(L)  Platelets 150 - 400 K/uL 160 180 168    ABG    Component Value Date/Time   PHART 7.349 (L) 08/08/2021 1705   PCO2ART 86.1 (HH) 08/08/2021 1705   PO2ART 68.7 (L) 08/08/2021 1705   HCO3 42.8 (H) 08/09/2021 0809   O2SAT 54.1 08/09/2021 0809    CBG (last 3)  Recent Labs    08/09/21 0009 08/09/21 0748 08/09/21 1217  GLUCAP 212* 124* 111*     Past surgical history:  He  has a past surgical history that includes Cardiac surgery and Coronary artery bypass graft.  Social history:  He  reports that he quit smoking about 11 months ago. His smoking use included cigarettes. He has a 60.00 pack-year smoking history. He has never  used smokeless tobacco. He reports that he does not drink alcohol and does not use drugs.   Review of systems:  Reivewed and negative  Family history:  His family history includes COPD in his father; Clotting disorder in his mother; Hypertension in his mother.    Medications:   No current facility-administered medications on file prior to encounter.   Current Outpatient Medications on File Prior to Encounter  Medication Sig   acetaminophen (TYLENOL) 500 MG tablet Take 1,000 mg by mouth every 6 (six) hours as needed.   albuterol (PROVENTIL) (2.5 MG/3ML) 0.083% nebulizer solution Take 3 mLs (2.5 mg total) by nebulization every 6 (six) hours as needed for wheezing or shortness of breath.   albuterol (VENTOLIN HFA) 108 (90 Base) MCG/ACT inhaler INHALE 2 PUFFS BY MOUTH EVERY 4 HOURS AS NEEDED (Patient taking differently: Inhale 1-2 puffs into the lungs every 4 (four) hours as needed for wheezing or shortness of breath.)   amLODipine (NORVASC) 5 MG tablet Take 5 mg by mouth daily.   bisoprolol (ZEBETA) 5 MG tablet Take 1 tablet (5 mg total) by mouth 2 (two) times daily.   budesonide-formoterol (SYMBICORT) 160-4.5 MCG/ACT inhaler Take 2 puffs first thing in am and then another 2 puffs about 12 hours later. (Patient taking differently: Inhale 2 puffs into the lungs every 12 (twelve) hours. Take 2 puffs first thing in am and then another 2 puffs about 12 hours later.)   clopidogrel (PLAVIX) 75 MG tablet Take 75 mg by mouth daily.   fenofibrate micronized (LOFIBRA) 134 MG capsule Take 134 mg by mouth daily before breakfast.   montelukast (SINGULAIR) 10 MG tablet Take 10 mg by mouth at bedtime.   pantoprazole (PROTONIX) 40 MG tablet Take 1 tablet (40 mg total) by mouth 2 (two) times daily.   predniSONE (DELTASONE) 10 MG tablet 2 until better then 1 daily (Patient taking differently: Take 20 mg by mouth daily. 2 until better then 1 daily)   propafenone (RYTHMOL) 225 MG tablet Take 1 tablet (225 mg  total) by mouth every 8 (eight) hours.   tamsulosin (FLOMAX) 0.4 MG CAPS capsule TAKE 1 CAPSULE(0.4 MG) BY MOUTH DAILY (Patient taking differently: Take 0.4 mg by mouth daily.)   torsemide (DEMADEX) 100 MG tablet Take 50 mg by mouth daily.   torsemide (DEMADEX) 20 MG tablet Take 1 tablet (20 mg total) by mouth daily.   zinc sulfate 220 (50 Zn) MG capsule Take 1 capsule (220 mg total) by mouth daily.   ascorbic acid (VITAMIN C) 500 MG tablet Take 1 tablet (500 mg total) by mouth daily. (Patient not taking: No sig reported)   guaiFENesin-dextromethorphan (ROBITUSSIN DM) 100-10 MG/5ML syrup Take 5  mLs by mouth every 4 (four) hours as needed for cough. (Patient not taking: No sig reported)   Tiotropium Bromide Monohydrate (SPIRIVA RESPIMAT) 2.5 MCG/ACT AERS Inhale 2 puffs into the lungs daily. (Patient not taking: No sig reported)   varenicline (CHANTIX) 0.5 MG tablet Take 0.5 mg by mouth 2 (two) times daily. (Patient not taking: No sig reported)     Critical care time: 34 minutes  Chesley Mires, MD [place] Pager - [phone] 08/09/2021, 2:25 PM

## 2021-08-10 LAB — CBC
HCT: 38.8 % — ABNORMAL LOW (ref 39.0–52.0)
Hemoglobin: 11.8 g/dL — ABNORMAL LOW (ref 13.0–17.0)
MCH: 31.3 pg (ref 26.0–34.0)
MCHC: 30.4 g/dL (ref 30.0–36.0)
MCV: 102.9 fL — ABNORMAL HIGH (ref 80.0–100.0)
Platelets: 167 10*3/uL (ref 150–400)
RBC: 3.77 MIL/uL — ABNORMAL LOW (ref 4.22–5.81)
RDW: 14.5 % (ref 11.5–15.5)
WBC: 15.5 10*3/uL — ABNORMAL HIGH (ref 4.0–10.5)
nRBC: 0 % (ref 0.0–0.2)

## 2021-08-10 LAB — CBG MONITORING, ED
Glucose-Capillary: 162 mg/dL — ABNORMAL HIGH (ref 70–99)
Glucose-Capillary: 127 mg/dL — ABNORMAL HIGH (ref 70–99)

## 2021-08-10 LAB — BASIC METABOLIC PANEL
Anion gap: 16 — ABNORMAL HIGH (ref 5–15)
BUN: 40 mg/dL — ABNORMAL HIGH (ref 8–23)
CO2: 38 mmol/L — ABNORMAL HIGH (ref 22–32)
Calcium: 8.4 mg/dL — ABNORMAL LOW (ref 8.9–10.3)
Chloride: 86 mmol/L — ABNORMAL LOW (ref 98–111)
Creatinine, Ser: 1.66 mg/dL — ABNORMAL HIGH (ref 0.61–1.24)
GFR, Estimated: 43 mL/min — ABNORMAL LOW (ref >60)
Glucose, Bld: 130 mg/dL — ABNORMAL HIGH (ref 70–99)
Potassium: 3.9 mmol/L (ref 3.5–5.1)
Sodium: 140 mmol/L (ref 135–145)

## 2021-08-10 LAB — GLUCOSE, CAPILLARY
Glucose-Capillary: 132 mg/dL — ABNORMAL HIGH (ref 70–99)
Glucose-Capillary: 127 mg/dL — ABNORMAL HIGH (ref 70–99)
Glucose-Capillary: 211 mg/dL — ABNORMAL HIGH (ref 70–99)

## 2021-08-10 LAB — MAGNESIUM: Magnesium: 2.3 mg/dL (ref 1.7–2.4)

## 2021-08-10 MED ORDER — ORAL CARE MOUTH RINSE
15.0000 mL | Freq: Two times a day (BID) | OROMUCOSAL | Status: DC
  Administered 2021-08-10 – 2021-08-12 (×4): 15 mL via OROMUCOSAL

## 2021-08-10 MED ORDER — METHYLPREDNISOLONE SODIUM SUCC 40 MG IJ SOLR
40.0000 mg | Freq: Two times a day (BID) | INTRAMUSCULAR | Status: AC
Start: 2021-08-10 — End: 2021-08-11
  Administered 2021-08-10 – 2021-08-11 (×3): 40 mg via INTRAVENOUS
  Filled 2021-08-10 (×3): qty 1

## 2021-08-10 MED ORDER — CHLORHEXIDINE GLUCONATE CLOTH 2 % EX PADS
6.0000 | MEDICATED_PAD | Freq: Every day | CUTANEOUS | Status: DC
  Administered 2021-08-10 – 2021-08-12 (×3): 6 via TOPICAL

## 2021-08-10 NOTE — Progress Notes (Addendum)
Palliative:  HPI: 75 y.o. male  with past medical history of COPD on 2L at home, diastolic heart failure, atrial fibrillation, diabetes, bilateral hearing loss, CKD stage 2 admitted on 08/08/2021 with worsening shortness of breath with concern for COPD exacerbation and heart failure exacerbation. Seen by palliative care Daniel Axe, NP August 2022 and noted goals for full code/full scope but no tracheostomy (also notes if he failed extubation he would not want reintubation at that time) with step-daughter Daniel Barker stated to be surrogate decision maker (wife with dementia). Plans were made for outpatient palliative care to follow.    I met today again with Daniel Barker. He is awake and alert and on 2L nasal cannula. He is feeling much better and improved from yesterday. He is happy to be off BiPAP and is asking about his lunch. He expresses his concern with recurrent hospitalization and recurrent declining respiratory status. We discussed expectations with his underlying COPD and progression and exacerbations. He shares with me about his father dying from lung disease. We discussed his own mortality and he shares with me that "everyone has to die" and he is not overly fearful of dying but does wish to live as long as possible. He is also very clear that he does not wish to suffer which is why he would not want prolonged ventilation or reintubation after failed extubation. He does not want to be a burden to his family or live without being able to care for himself on some level. He confirms that Daniel Barker is aware of his wishes. We did discuss that as his disease progresses there may be a time when his health declines that it would not make sense to even pursue intubation to begin with if he already has poor quality of life. We discussed that we need to continue to have these discussions over time to continue to reassess if these plans and decisions still make sense for him. He agrees and understands.   I caught his  HCPOA/stepdaughter, Daniel Barker, as she was entering. I updated her on my conversation with Daniel Barker. Daniel Barker confirms that they had plans to meet with palliative care outpatient but they were told their appointment was in another month. We discussed the need for sooner follow up given his recurrent hospitalizations. Daniel Barker is very interested in continuing conversation and doing what is best for Daniel Barker. We also discussed her work and I encouraged her to consider FMLA to protect herself as she deals with Daniel Barker's complicated illness.   All questions/concerns addressed. Emotional support provided.   Exam: Alert, oriented. Breathing easy at rest, regular, unlabored. Abd flat. Moves all extremities.   Plan: - Recommend ongoing discussions with outpatient palliative care. Goals are clear for now.  - In process of connecting with outpatient palliative care although unclear with which palliative service.   [place], NP Palliative Medicine Team Pager [phone] (Please see [email or web address] for schedule) Team Phone [phone]    Greater than 50%  of this time was spent counseling and coordinating care related to the above assessment and plan

## 2021-08-10 NOTE — ED Notes (Signed)
Report received from Mel RN

## 2021-08-10 NOTE — Progress Notes (Signed)
PROGRESS NOTE    Daniel Barker  [id number] DOB: 23-Aug-1946 DOA: 08/08/2021 PCP: [name], MD   Brief Narrative:   75 yo WM with hx of severe COPD, chronic hypercapnic and hypoxic respiratory failure on home O2 at 2 L minute, diabetes, chronic diastolic heart failure, bilateral hearing loss, chronic A. fib not on anticoagulation due to patient's choice, CKD stage III presents the ER today with a 2-day history of worsening shortness of breath.  Patient was admitted with acute mixed respiratory failure in the setting of COPD exacerbation due to [place].  Assessment & Plan:   Principal Problem:   Acute on chronic respiratory failure with hypoxia and hypercapnia (HCC) - baseline PCO2 80-90s, chronically on 2 L/min at home. Active Problems:   Diabetes mellitus ([place])   Chronic atrial fibrillation (HCC) - not on systemic anticoagulants due to patient's choice.   Chronic kidney disease, stage 3a (HCC)   Chronic diastolic heart failure (HCC)   COPD exacerbation (HCC)   Acute hypoxemic respiratory failure (HCC)   Acute on chronic hypoxemic and hypercapnic respiratory failure -Currently on 5 L nasal cannula oxygen, wears 2 L at bedtime at baseline -Likely secondary to COPD exacerbation due to COVID-19 infection -Appreciate pulmonology evaluation -Appreciate palliative evaluation for goals of care, patient desires full code -Continue Solu-Medrol, doxycycline and remdesivir   COVID-19 pneumonia -Remdesivir day 2 -Maintain on isolation -No need for baricitinib or Actemra at this time   Acute COPD exacerbation -Continue IV Solu-Medrol, p.o. doxycycline, DuoNebs every 6 hours -Follow ABG as appropriate   Chronic diastolic heart failure/atrial fibrillation -Patient has a high salt diet at home -Hold further IV diuresis as no significant volume overload currently noted -Creatinine trend bumping up -Continue on Rythmol and monitor on telemetry -Not on anticoagulation due to patient  preference   Type 2 diabetes with mild hyperglycemia-improving -Likely steroid-induced -SSI   CKD stage IIIa -Continue to monitor  -Diuresis discontinued 9/13   DVT prophylaxis: Lovenox Code Status: Full Family Communication: Discussed with daughter on phone 9/13 Disposition Plan:  Status is: Inpatient   Remains inpatient appropriate because:IV treatments appropriate due to intensity of illness or inability to take PO and Inpatient level of care appropriate due to severity of illness   Dispo: The patient is from: Home              Anticipated d/c is to: Home              Patient currently is not medically stable to d/c.              Difficult to place patient No     Consultants:  PCCM Palliative care   Procedures:  See below   Antimicrobials:  Anti-infectives (From admission, onward)    Start     Dose/Rate Route Frequency Ordered Stop   08/10/21 1000  remdesivir 100 mg in sodium chloride 0.9 % 100 mL IVPB       See Hyperspace for full Linked Orders Report.   100 mg 200 mL/hr over 30 Minutes Intravenous Daily 08/09/21 1056 08/14/21 0959   08/09/21 1100  remdesivir 100 mg in sodium chloride 0.9 % 100 mL IVPB       See Hyperspace for full Linked Orders Report.   100 mg 200 mL/hr over 30 Minutes Intravenous Every 30 min 08/09/21 1056 08/09/21 1334   08/09/21 1000  doxycycline (VIBRA-TABS) tablet 100 mg        100 mg Oral Every 12 hours 08/08/21 1819  08/08/21 1715  doxycycline (VIBRA-TABS) tablet 100 mg        100 mg Oral  Once 08/08/21 1701 08/08/21 1801       Subjective: Patient seen and evaluated today and is mostly somnolent.  He does not appear to be in any significant respiratory distress with no acute overnight events noted.  Objective: Vitals:   08/10/21 0300 08/10/21 0600 08/10/21 0815 08/10/21 0836  BP: (!) 118/55 125/69 (!) 118/92   Pulse: 60 (!) 54 (!) 58   Resp: (!) 22 (!) 21 (!) 22   Temp:      TempSrc:      SpO2: 100% 99% 90% 97%  Weight:       Height:        Intake/Output Summary (Last 24 hours) at 08/10/2021 0944 Last data filed at 08/10/2021 0852 Gross per 24 hour  Intake 191.91 ml  Output 300 ml  Net -108.09 ml   Filed Weights   08/08/21 1451  Weight: 58 kg    Examination:  General exam: Appears somnolent but arousable Respiratory system: Clear to auscultation. Respiratory effort normal.  Currently on 5 L nasal cannula Cardiovascular system: S1 & S2 heard, RRR.  Gastrointestinal system: Abdomen is soft Central nervous system: Somnolent but arousable Extremities: No edema Skin: No significant lesions noted Psychiatry: Flat affect.    Data Reviewed: I have personally reviewed following labs and imaging studies  CBC: Recent Labs  Lab 08/08/21 1510 08/09/21 0505 08/10/21 0436  WBC 14.2* 9.4 15.5*  NEUTROABS 13.0* 8.2*  --   HGB 12.3* 11.4* 11.8*  HCT 40.4 36.6* 38.8*  MCV 103.9* 103.4* 102.9*  PLT 180 160 [id number]   Basic Metabolic Panel: Recent Labs  Lab 08/08/21 1510 08/09/21 0505 08/10/21 0436  NA 141 144 140  K 4.6 4.5 3.9  CL 88* 87* 86*  CO2 42* 46* 38*  GLUCOSE 174* 178* 130*  BUN 35* 36* 40*  CREATININE 1.62* 1.58* 1.66*  CALCIUM 9.2 8.9 8.4*  MG  --  2.4 2.3   GFR: Estimated Creatinine Clearance: 32 mL/min (A) (by C-G formula based on SCr of 1.66 mg/dL (H)). Liver Function Tests: Recent Labs  Lab 08/08/21 1510 08/09/21 0505  AST 17 14*  ALT 19 18  ALKPHOS 43 37*  BILITOT 0.9 0.9  PROT 7.0 5.7*  ALBUMIN 3.9 3.2*   No results for input(s): LIPASE, AMYLASE in the last 168 hours. No results for input(s): AMMONIA in the last 168 hours. Coagulation Profile: No results for input(s): INR, PROTIME in the last 168 hours. Cardiac Enzymes: No results for input(s): CKTOTAL, CKMB, CKMBINDEX, TROPONINI in the last 168 hours. BNP (last 3 results) No results for input(s): PROBNP in the last 8760 hours. HbA1C: Recent Labs    08/08/21 1510  HGBA1C 5.9*   CBG: Recent Labs  Lab  08/09/21 0748 08/09/21 1217 08/09/21 1802 08/09/21 2234 08/10/21 0852  GLUCAP 124* 111* 109* 191* 162*   Lipid Profile: No results for input(s): CHOL, HDL, LDLCALC, TRIG, CHOLHDL, LDLDIRECT in the last 72 hours. Thyroid Function Tests: No results for input(s): TSH, T4TOTAL, FREET4, T3FREE, THYROIDAB in the last 72 hours. Anemia Panel: No results for input(s): VITAMINB12, FOLATE, FERRITIN, TIBC, IRON, RETICCTPCT in the last 72 hours. Sepsis Labs: No results for input(s): PROCALCITON, LATICACIDVEN in the last 168 hours.  Recent Results (from the past 240 hour(s))  Resp Panel by RT-PCR (Flu A&B, Covid) Nasopharyngeal Swab     Status: Abnormal   Collection Time: 08/08/21  3:00  PM   Specimen: Nasopharyngeal Swab; Nasopharyngeal(NP) swabs in vial transport medium  Result Value Ref Range Status   SARS Coronavirus 2 by RT PCR POSITIVE (A) NEGATIVE Final    Comment: CRITICAL RESULT CALLED TO, READ BACK BY AND VERIFIED WITH: Sharyon Cable 1624 08/08/2021 COLEMAN,R (NOTE) SARS-CoV-2 target nucleic acids are DETECTED.  The SARS-CoV-2 RNA is generally detectable in upper respiratory specimens during the acute phase of infection. Positive results are indicative of the presence of the identified virus, but do not rule out bacterial infection or co-infection with other pathogens not detected by the test. Clinical correlation with patient history and other diagnostic information is necessary to determine patient infection status. The expected result is Negative.  Fact Sheet for Patients: EntrepreneurPulse.com.au  Fact Sheet for Healthcare Providers: IncredibleEmployment.be  This test is not yet approved or cleared by the [place] FDA and  has been authorized for detection and/or diagnosis of SARS-CoV-2 by FDA under an Emergency Use Authorization (EUA).  This EUA will remain in effect (meaning this t est can be used) for the duration of  the COVID-19  declaration under Section 564(b)(1) of the Act, 21 U.S.C. section 360bbb-3(b)(1), unless the authorization is terminated or revoked sooner.     Influenza A by PCR NEGATIVE NEGATIVE Final   Influenza B by PCR NEGATIVE NEGATIVE Final    Comment: (NOTE) The Xpert Xpress SARS-CoV-2/FLU/RSV plus assay is intended as an aid in the diagnosis of influenza from Nasopharyngeal swab specimens and should not be used as a sole basis for treatment. Nasal washings and aspirates are unacceptable for Xpert Xpress SARS-CoV-2/FLU/RSV testing.  Fact Sheet for Patients: EntrepreneurPulse.com.au  Fact Sheet for Healthcare Providers: IncredibleEmployment.be  This test is not yet approved or cleared by the [place] FDA and has been authorized for detection and/or diagnosis of SARS-CoV-2 by FDA under an Emergency Use Authorization (EUA). This EUA will remain in effect (meaning this test can be used) for the duration of the COVID-19 declaration under Section 564(b)(1) of the Act, 21 U.S.C. section 360bbb-3(b)(1), unless the authorization is terminated or revoked.  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 The cardiac silhouette is unremarkable. Chronic scarring throughout the lungs without airspace disease, effusion, or pneumothorax. No acute bony abnormalities. Chronic healing left humeral fracture. IMPRESSION: 1. Stable chest, no acute process. Electronically Signed   By: Randa Ngo M.D.   On: 08/08/2021 16:04        Scheduled Meds:  clopidogrel  75 mg Oral Daily   doxycycline  100 mg Oral Q12H   enoxaparin (LOVENOX) injection  40 mg Subcutaneous Q24H   guaiFENesin   600 mg Oral BID   insulin aspart  0-15 Units Subcutaneous TID WC   insulin aspart  0-5 Units Subcutaneous QHS   ipratropium-albuterol  3 mL Nebulization Q6H   methylPREDNISolone (SOLU-MEDROL) injection  90 mg Intravenous Q12H   montelukast  10 mg Oral QHS   pantoprazole  40 mg Oral BID   propafenone  225 mg Oral Q8H   tamsulosin  0.4 mg Oral Daily   Continuous Infusions:  remdesivir 100 mg in NS 100 mL       LOS: 2 days    Time spent:  95 minutes    Zeus Marquis Darleen Crocker, DO [facility]  If 7PM-7AM, please contact night-coverage [email or web address] 08/10/2021, 9:44 AM

## 2021-08-10 NOTE — Progress Notes (Signed)
[facility]   Patient name: Daniel Barker Admit date: 08/08/2021  DOB: [birth year] LOS: 2  MRN: [id number] Consult date: 08/09/2021  Referring provider: Dr. Heath Lark, [facility]: Short of breath    History:  75 yo male former smoker with severe COPD and respiratory failure with frequent hospital admissions presented to [facility] on 08/08/21 with shortness of breath.  He has been getting headache, cough, and leg swelling.  No fever, sputum, or wheeze.  Found to have acute on chronic hypoxia/hypercapnia and positive for COVID 19 infection.  Started on steroids, antibiotics, remdesivir and Bipap.  Past medical history:  COPD on home oxygen, DM type 2, Diastolic CHF, Hearing loss, A fib, CKD 3a  Significant events:  9/11 Admit 9/13 off Bipap  Studies:    Micro:  COVID 9/11 >> Positive  Lines:     Antibiotics:  Doxycycline 9/11 >> Remdesivir 9/12 >>   Consults:  Palliative care    Interim history:  Breathing better.  Has cough with chest congestion.  Brings up clear sputum sometimes.  On 2 liters oxygen with SpO2 > 96%.  Vital signs:  BP (!) 144/73   Pulse (!) 58   Temp 98.1 F (36.7 C) (Oral)   Resp 16   Ht '5\' 6"'$  (1.676 m)   Wt 69.5 kg   SpO2 100%   BMI 24.73 kg/m   Intake/output:  I/O last 3 completed shifts: In: 191.9 [IV Piggyback:191.9] Out: 400 [Urine:400]   Physical exam:   General - alert Eyes - pupils reactive ENT - no sinus tenderness, no stridor, decreased hearing Cardiac - regular rate/rhythm, no murmur Chest - decreased breath sounds b/l, no wheezing or rales Abdomen - soft, non tender, + bowel sounds Extremities - no cyanosis, clubbing, or edema Skin - no rashes Neuro - normal strength, moves extremities, follows commands Psych - normal mood and behavior      Best practice:   DVT - lovenox SUP - protonix Nutrition - carb mod/heart healthy Mobility - bed rest   Assessment/plan:    Acute on chronic hypoxic/hypercapnic respiratory failure from COPD exacerbation secondary to COVID 19 infection. - day 3 of steroids >> recommend taper over 10 days course - day 3 of doxycycline - day 2 of remdesivir - goal SpO2 90 to 95% - scheduled BDs - continue singulair  History of chronic diastolic dysfx, atrial fibrillation. - continue plavix, rhythmol, lasix  DM type 2 poorly controlled with steroid induced hyperglycemia. - SSI  CKD 3a. - f/u BMET  Deconditioning. - PT consulted  Goals of care. - palliative care consulted  D/w Dr. Manuella Ghazi.  Should be okay to transfer out of ICU/SDU.  Resolved hospital problems:    Goals of care/Family discussions:  Code status: full code  Labs:   CMP Latest Ref Rng & Units 08/10/2021 08/09/2021 08/08/2021  Glucose 70 - 99 mg/dL 130(H) 178(H) 174(H)  BUN 8 - 23 mg/dL 40(H) 36(H) 35(H)  Creatinine 0.61 - 1.24 mg/dL 1.66(H) 1.58(H) 1.62(H)  Sodium 135 - 145 mmol/L 140 144 141  Potassium 3.5 - 5.1 mmol/L 3.9 4.5 4.6  Chloride 98 - 111 mmol/L 86(L) 87(L) 88(L)  CO2 22 - 32 mmol/L 38(H) 46(H) 42(H)  Calcium 8.9 - 10.3 mg/dL 8.4(L) 8.9 9.2  Total Protein 6.5 - 8.1 g/dL - 5.7(L) 7.0  Total Bilirubin 0.3 - 1.2 mg/dL - 0.9 0.9  Alkaline Phos 38 - 126 U/L - 37(L) 43  AST 15 - 41 U/L - 14(L) 17  ALT 0 - 44 U/L - 18 19    CBC Latest Ref Rng & Units 08/10/2021 08/09/2021 08/08/2021  WBC 4.0 - 10.5 K/uL 15.5(H) 9.4 14.2(H)  Hemoglobin 13.0 - 17.0 g/dL 11.8(L) 11.4(L) 12.3(L)  Hematocrit 39.0 - 52.0 % 38.8(L) 36.6(L) 40.4  Platelets 150 - 400 K/uL 167 160 180    ABG    Component Value Date/Time   PHART 7.349 (L) 08/08/2021 1705   PCO2ART 86.1 (HH) 08/08/2021 1705   PO2ART 68.7 (L) 08/08/2021 1705   HCO3 42.8 (H) 08/09/2021 0809   O2SAT 54.1 08/09/2021 0809    CBG (last 3)  Recent Labs    08/09/21 2234 08/10/21 0852 08/10/21 1006  GLUCAP 191* 162* 127*    Signature:  Chesley Mires, MD [place] Pager  - [phone] 08/10/2021, 1:39 PM

## 2021-08-10 NOTE — ED Notes (Signed)
Pt O2 reduced to 2L per his norm, O2 sat remains at 100%

## 2021-08-11 DIAGNOSIS — J9601 Acute respiratory failure with hypoxia: Secondary | ICD-10-CM

## 2021-08-11 LAB — CBC
HCT: 36.4 % — ABNORMAL LOW (ref 39.0–52.0)
Hemoglobin: 11.1 g/dL — ABNORMAL LOW (ref 13.0–17.0)
MCH: 31.2 pg (ref 26.0–34.0)
MCHC: 30.5 g/dL (ref 30.0–36.0)
MCV: 102.2 fL — ABNORMAL HIGH (ref 80.0–100.0)
Platelets: 145 10*3/uL — ABNORMAL LOW (ref 150–400)
RBC: 3.56 MIL/uL — ABNORMAL LOW (ref 4.22–5.81)
RDW: 14.6 % (ref 11.5–15.5)
WBC: 12.2 10*3/uL — ABNORMAL HIGH (ref 4.0–10.5)
nRBC: 0 % (ref 0.0–0.2)

## 2021-08-11 LAB — BASIC METABOLIC PANEL
Anion gap: 10 (ref 5–15)
BUN: 42 mg/dL — ABNORMAL HIGH (ref 8–23)
CO2: 45 mmol/L — ABNORMAL HIGH (ref 22–32)
Calcium: 9.2 mg/dL (ref 8.9–10.3)
Chloride: 88 mmol/L — ABNORMAL LOW (ref 98–111)
Creatinine, Ser: 1.54 mg/dL — ABNORMAL HIGH (ref 0.61–1.24)
GFR, Estimated: 47 mL/min — ABNORMAL LOW (ref >60)
Glucose, Bld: 124 mg/dL — ABNORMAL HIGH (ref 70–99)
Potassium: 4.6 mmol/L (ref 3.5–5.1)
Sodium: 143 mmol/L (ref 135–145)

## 2021-08-11 LAB — MAGNESIUM: Magnesium: 2.2 mg/dL (ref 1.7–2.4)

## 2021-08-11 LAB — GLUCOSE, CAPILLARY
Glucose-Capillary: 163 mg/dL — ABNORMAL HIGH (ref 70–99)
Glucose-Capillary: 201 mg/dL — ABNORMAL HIGH (ref 70–99)
Glucose-Capillary: 93 mg/dL (ref 70–99)
Glucose-Capillary: 119 mg/dL — ABNORMAL HIGH (ref 70–99)

## 2021-08-11 MED ORDER — SALINE SPRAY 0.65 % NA SOLN
2.0000 | Freq: Two times a day (BID) | NASAL | Status: DC
  Administered 2021-08-11 – 2021-08-12 (×3): 2 via NASAL
  Filled 2021-08-11: qty 44

## 2021-08-11 MED ORDER — AZELASTINE HCL 0.1 % NA SOLN
1.0000 | Freq: Two times a day (BID) | NASAL | Status: DC
  Administered 2021-08-12: 1 via NASAL
  Filled 2021-08-11 (×2): qty 30

## 2021-08-11 MED ORDER — FLUTICASONE PROPIONATE 50 MCG/ACT NA SUSP
1.0000 | Freq: Every day | NASAL | Status: DC
  Administered 2021-08-11 – 2021-08-12 (×2): 1 via NASAL
  Filled 2021-08-11: qty 16

## 2021-08-11 MED ORDER — PREDNISONE 20 MG PO TABS
40.0000 mg | ORAL_TABLET | Freq: Every day | ORAL | Status: DC
  Administered 2021-08-12: 40 mg via ORAL
  Filled 2021-08-11: qty 2

## 2021-08-11 MED ORDER — INSULIN ASPART 100 UNIT/ML IJ SOLN
5.0000 [IU] | Freq: Three times a day (TID) | INTRAMUSCULAR | Status: DC
  Administered 2021-08-11 – 2021-08-12 (×3): 5 [IU] via SUBCUTANEOUS

## 2021-08-11 NOTE — Evaluation (Signed)
Physical Therapy Evaluation Patient Details Name: Daniel Barker MRN: [id number] DOB: Apr 04, 1946 Today's Date: 08/11/2021  History of Present Illness  75 yo WM with hx of severe COPD, chronic hypercapnic and hypoxic respiratory failure on home O2 at 2 L minute, diabetes, chronic diastolic heart failure, bilateral hearing loss, chronic A. fib not on anticoagulation due to patient's choice, CKD stage III presents the ER today with a 2-day history of worsening shortness of breath.  Patient states that he was at a picnic on Friday.  Start developing worsening shortness of breath on Saturday.  Came to the ER on Sunday.  Patient states he is not any fevers today but did feel feverish on Saturday.  Patient has had a cough with worsening shortness of breath.  He describes his sputum is somewhat yellow.  This is quite normal for him.  His breathing difficulties have increased over the weekend.     Patient states that he lives with his stepdaughter and his demented wife.    Patient states that is not following a low-salt diet at home.  He states that when he was discharged last time to the hospital, his legs were improved as far as edema.  They started swelling about 5 days after discharge.  Patient states that he eats deli meat every day in form of a sandwich for lunch.  He also eats hot dogs several times a week.  He has never been counseled on a low-salt diet.   Clinical Impression  Patient functioning near baseline for functional mobility and gait demonstrating good return for ambulation in room without loss of balance, limited mostly due to SOB and fatigue and tolerated sitting up in chair after therapy.  Patient will benefit from continued physical therapy in hospital and recommended venue below to increase strength, balance, endurance for safe ADLs and gait.         Recommendations for follow up therapy are one component of a multi-disciplinary discharge planning process, led by the attending physician.   Recommendations may be updated based on patient status, additional functional criteria and insurance authorization.  Follow Up Recommendations Home health PT;Supervision for mobility/OOB;Supervision - Intermittent    Equipment Recommendations  None recommended by PT    Recommendations for Other Services       Precautions / Restrictions Precautions Precautions: Fall Restrictions Weight Bearing Restrictions: No      Mobility  Bed Mobility Overal bed mobility: Modified Independent                  Transfers Overall transfer level: Modified independent               General transfer comment: slightly labored movement  Ambulation/Gait Ambulation/Gait assistance: Supervision;Min guard Gait Distance (Feet): 22 Feet Assistive device: None;1 person hand held assist Gait Pattern/deviations: Decreased step length - right;Decreased step length - left;Decreased stride length Gait velocity: decreased   General Gait Details: slightly labored cadence without loss of balance, limited mostly due to c/o fatigue and SOB with SpO2 dropping from 97% to 75% on room air  Stairs            Wheelchair Mobility    Modified Rankin (Stroke Patients Only)       Balance Overall balance assessment: Mild deficits observed, not formally tested  Pertinent Vitals/Pain Pain Assessment: No/denies pain    Home Living Family/patient expects to be discharged to:: Private residence Living Arrangements: Spouse/significant other;Children Available Help at Discharge: Family;Available PRN/intermittently Type of Home: House Home Access: Stairs to enter Entrance Stairs-Rails: Right;Left;Can reach both Entrance Stairs-Number of Steps: 3 Home Layout: One level Home Equipment: Cane - single point;Walker - 2 wheels;Shower seat;Bedside commode;Grab bars - tub/shower      Prior Function Level of Independence: Independent with  assistive device(s)         Comments: household and short distanced community ambulator, uses RW PRN, drives     Hand Dominance   Dominant Hand: Left    Extremity/Trunk Assessment   Upper Extremity Assessment Upper Extremity Assessment: Overall WFL for tasks assessed    Lower Extremity Assessment Lower Extremity Assessment: Generalized weakness    Cervical / Trunk Assessment Cervical / Trunk Assessment: Normal  Communication   Communication: HOH  Cognition Arousal/Alertness: Awake/alert Behavior During Therapy: WFL for tasks assessed/performed Overall Cognitive Status: Within Functional Limits for tasks assessed                                        General Comments      Exercises     Assessment/Plan    PT Assessment Patient needs continued PT services  PT Problem List Decreased strength;Decreased activity tolerance;Decreased balance;Decreased mobility       PT Treatment Interventions DME instruction;Gait training;Stair training;Functional mobility training;Therapeutic activities;Therapeutic exercise;Balance training;Patient/family education    PT Goals (Current goals can be found in the Care Plan section)  Acute Rehab PT Goals Patient Stated Goal: return home with family to assist PT Goal Formulation: With patient Time For Goal Achievement: 08/18/21 Potential to Achieve Goals: Good    Frequency Min 2X/week   Barriers to discharge        Co-evaluation               AM-PAC PT "6 Clicks" Mobility  Outcome Measure Help needed turning from your back to your side while in a flat bed without using bedrails?: None Help needed moving from lying on your back to sitting on the side of a flat bed without using bedrails?: None Help needed moving to and from a bed to a chair (including a wheelchair)?: A Little Help needed standing up from a chair using your arms (e.g., wheelchair or bedside chair)?: A Little Help needed to walk in  hospital room?: A Little Help needed climbing 3-5 steps with a railing? : A Little 6 Click Score: 20    End of Session Equipment Utilized During Treatment: Oxygen Activity Tolerance: Patient tolerated treatment well;Patient limited by fatigue Patient left: in chair;with call bell/phone within reach Nurse Communication: Mobility status PT Visit Diagnosis: Unsteadiness on feet (R26.81);Other abnormalities of gait and mobility (R26.89);Muscle weakness (generalized) (M62.81)    Time: [id number] PT Time Calculation (min) (ACUTE ONLY): 25 min   Charges:   PT Evaluation $PT Eval Moderate Complexity: 1 Mod PT Treatments $Therapeutic Activity: 23-37 mins        2:11 PM, 08/11/21 Lonell Grandchild, MPT Physical Therapist with [facility] 336 [phone] office [phone] mobile phone

## 2021-08-11 NOTE — Progress Notes (Signed)
PROGRESS NOTE    Daniel Barker  [id number] DOB: Apr 08, 1946 DOA: 08/08/2021 PCP: [name], MD   Brief Narrative:   75 yo WM with hx of severe COPD, chronic hypercapnic and hypoxic respiratory failure on home O2 at 2 L minute, diabetes, chronic diastolic heart failure, bilateral hearing loss, chronic A. fib not on anticoagulation due to patient's choice, CKD stage III presents the ER today with a 2-day history of worsening shortness of breath.  Patient was admitted with acute mixed respiratory failure in the setting of COPD exacerbation due to [place].  Assessment & Plan:   Principal Problem:   Acute on chronic respiratory failure with hypoxia and hypercapnia (HCC) - baseline PCO2 80-90s, chronically on 2 L/min at home. Active Problems:   Diabetes mellitus ([place])   Chronic atrial fibrillation (HCC) - not on systemic anticoagulants due to patient's choice.   Chronic kidney disease, stage 3a (HCC)   Chronic diastolic heart failure (HCC)   COPD exacerbation (HCC)   Acute hypoxemic respiratory failure (HCC)   Acute on chronic hypoxemic and hypercapnic respiratory failure -Currently on 2 L nasal cannula oxygen, wears 2 L at bedtime at baseline -Likely secondary to COPD exacerbation due to COVID-19 infection -Appreciate pulmonology evaluation -Appreciate palliative evaluation for goals of care, patient desires full code -Continue Solu-Medrol, doxycycline and remdesivir   COVID-19 pneumonia -Remdesivir day 3 -Maintain on isolation -No need for baricitinib or Actemra at this time   Acute COPD exacerbation -Continue IV Solu-Medrol, p.o. doxycycline, DuoNebs every 6 hours   Chronic diastolic heart failure/atrial fibrillation -Patient has a high salt diet at home -Hold further IV diuresis as no significant volume overload at this time -Continue on Rythmol and monitor on telemetry -Not on anticoagulation due to patient preference, high bleeding risk   Type 2 diabetes with mild  hyperglycemia-improving -Likely steroid-induced -continue CBG monitoring and SSI coverage CBG (last 3)  Recent Labs    08/10/21 1640 08/10/21 2006 08/11/21 0757  GLUCAP 127* 211* 163*     CKD stage IIIa -Continue to monitor  -Diuresis discontinued 9/13   DVT prophylaxis: Lovenox Code Status: Full Family Communication: Discussed with daughter on phone 9/13 Disposition Plan:  Status is: Inpatient   Remains inpatient appropriate because:IV treatments appropriate due to intensity of illness or inability to take PO and Inpatient level of care appropriate due to severity of illness   Dispo: The patient is from: Home              Anticipated d/c is to: Home pending PT eval              Patient currently is not medically stable to d/c.              Difficult to place patient No     Consultants:  PCCM Palliative care   Procedures:  See below   Antimicrobials:  Anti-infectives (From admission, onward)    Start     Dose/Rate Route Frequency Ordered Stop   08/10/21 1000  remdesivir 100 mg in sodium chloride 0.9 % 100 mL IVPB       See Hyperspace for full Linked Orders Report.   100 mg 200 mL/hr over 30 Minutes Intravenous Daily 08/09/21 1056 08/14/21 0959   08/09/21 1100  remdesivir 100 mg in sodium chloride 0.9 % 100 mL IVPB       See Hyperspace for full Linked Orders Report.   100 mg 200 mL/hr over 30 Minutes Intravenous Every 30 min 08/09/21 1056 08/09/21  1334   08/09/21 1000  doxycycline (VIBRA-TABS) tablet 100 mg        100 mg Oral Every 12 hours 08/08/21 1819     08/08/21 1715  doxycycline (VIBRA-TABS) tablet 100 mg        100 mg Oral  Once 08/08/21 1701 08/08/21 1801       Subjective: Patient reports having less SOB.   Wants to get out of bed today.    Objective: Vitals:   08/11/21 0500 08/11/21 0600 08/11/21 0700 08/11/21 0758  BP: (!) 151/58 (!) 135/56 (!) 153/61   Pulse: (!) 54 60 63 63  Resp: 12 (!) 21  20  Temp:    97.8 F (36.6 C)  TempSrc:    Oral   SpO2: 94% 96% 95% 98%  Weight:      Height:        Intake/Output Summary (Last 24 hours) at 08/11/2021 1019 Last data filed at 08/11/2021 0400 Gross per 24 hour  Intake 342.36 ml  Output 1001 ml  Net -658.64 ml   Filed Weights   08/08/21 1451 08/10/21 1210 08/11/21 0456  Weight: 58 kg 69.5 kg 80.5 kg   Examination:  General exam: awake, alert, cooperative, NAD.  Respiratory system: no increased work of breathing.  Cardiovascular system: normal s1, s2 sounds.    Gastrointestinal system: abd soft, ND/NT, no HSM.  Central nervous system: nonfocal exam.  Extremities: no C/C/E.  Skin: No gross lesions seen.  Psychiatry: normal affect today.  Data Reviewed: I have personally reviewed following labs and imaging studies  CBC: Recent Labs  Lab 08/08/21 1510 08/09/21 0505 08/10/21 0436 08/11/21 0454  WBC 14.2* 9.4 15.5* 12.2*  NEUTROABS 13.0* 8.2*  --   --   HGB 12.3* 11.4* 11.8* 11.1*  HCT 40.4 36.6* 38.8* 36.4*  MCV 103.9* 103.4* 102.9* 102.2*  PLT 180 160 167 [id number]*   Basic Metabolic Panel: Recent Labs  Lab 08/08/21 1510 08/09/21 0505 08/10/21 0436 08/11/21 0454  NA 141 144 140 143  K 4.6 4.5 3.9 4.6  CL 88* 87* 86* 88*  CO2 42* 46* 38* 45*  GLUCOSE 174* 178* 130* 124*  BUN 35* 36* 40* 42*  CREATININE 1.62* 1.58* 1.66* 1.54*  CALCIUM 9.2 8.9 8.4* 9.2  MG  --  2.4 2.3 2.2   GFR: Estimated Creatinine Clearance: 42 mL/min (A) (by C-G formula based on SCr of 1.54 mg/dL (H)). Liver Function Tests: Recent Labs  Lab 08/08/21 1510 08/09/21 0505  AST 17 14*  ALT 19 18  ALKPHOS 43 37*  BILITOT 0.9 0.9  PROT 7.0 5.7*  ALBUMIN 3.9 3.2*   No results for input(s): LIPASE, AMYLASE in the last 168 hours. No results for input(s): AMMONIA in the last 168 hours. Coagulation Profile: No results for input(s): INR, PROTIME in the last 168 hours. Cardiac Enzymes: No results for input(s): CKTOTAL, CKMB, CKMBINDEX, TROPONINI in the last 168 hours. BNP (last 3  results) No results for input(s): PROBNP in the last 8760 hours. HbA1C: Recent Labs    08/08/21 1510  HGBA1C 5.9*   CBG: Recent Labs  Lab 08/10/21 1006 08/10/21 1208 08/10/21 1640 08/10/21 2006 08/11/21 0757  GLUCAP 127* 132* 127* 211* 163*   Lipid Profile: No results for input(s): CHOL, HDL, LDLCALC, TRIG, CHOLHDL, LDLDIRECT in the last 72 hours. Thyroid Function Tests: No results for input(s): TSH, T4TOTAL, FREET4, T3FREE, THYROIDAB in the last 72 hours. Anemia Panel: No results for input(s): VITAMINB12, FOLATE, FERRITIN, TIBC, IRON, RETICCTPCT in the  last 72 hours. Sepsis Labs: No results for input(s): PROCALCITON, LATICACIDVEN in the last 168 hours.  Recent Results (from the past 240 hour(s))  Resp Panel by RT-PCR (Flu A&B, Covid) Nasopharyngeal Swab     Status: Abnormal   Collection Time: 08/08/21  3:00 PM   Specimen: Nasopharyngeal Swab; Nasopharyngeal(NP) swabs in vial transport medium  Result Value Ref Range Status   SARS Coronavirus 2 by RT PCR POSITIVE (A) NEGATIVE Final    Comment: CRITICAL RESULT CALLED TO, READ BACK BY AND VERIFIED WITH: Sharyon Cable 1624 08/08/2021 COLEMAN,R (NOTE) SARS-CoV-2 target nucleic acids are DETECTED.  The SARS-CoV-2 RNA is generally detectable in upper respiratory specimens during the acute phase of infection. Positive results are indicative of the presence of the identified virus, but do not rule out bacterial infection or co-infection with other pathogens not detected by the test. Clinical correlation with patient history and other diagnostic information is necessary to determine patient infection status. The expected result is Negative.  Fact Sheet for Patients: EntrepreneurPulse.com.au  Fact Sheet for Healthcare Providers: IncredibleEmployment.be  This test is not yet approved or cleared by the [place] FDA and  has been authorized for detection and/or diagnosis of SARS-CoV-2 by FDA  under an Emergency Use Authorization (EUA).  This EUA will remain in effect (meaning this t est can be used) for the duration of  the COVID-19 declaration under Section 564(b)(1) of the Act, 21 U.S.C. section 360bbb-3(b)(1), unless the authorization is terminated or revoked sooner.     Influenza A by PCR NEGATIVE NEGATIVE Final   Influenza B by PCR NEGATIVE NEGATIVE Final    Comment: (NOTE) The Xpert Xpress SARS-CoV-2/FLU/RSV plus assay is intended as an aid in the diagnosis of influenza from Nasopharyngeal swab specimens and should not be used as a sole basis for treatment. Nasal washings and aspirates are unacceptable for Xpert Xpress SARS-CoV-2/FLU/RSV testing.  Fact Sheet for Patients: EntrepreneurPulse.com.au  Fact Sheet for Healthcare Providers: IncredibleEmployment.be  This test is not yet approved or cleared by the [place] FDA and has been authorized for detection and/or diagnosis of SARS-CoV-2 by FDA under an Emergency Use Authorization (EUA). This EUA will remain in effect (meaning this test can be used) for the duration of the COVID-19 declaration under Section 564(b)(1) of the Act, 21 U.S.C. section 360bbb-3(b)(1), unless the authorization is terminated or revoked.  Performed at [facility], [address], [place], [place] [address]     Radiology Studies: No results found.  Scheduled Meds:  Chlorhexidine Gluconate Cloth  6 each Topical Daily   clopidogrel  75 mg Oral Daily   doxycycline  100 mg Oral Q12H   enoxaparin (LOVENOX) injection  40 mg Subcutaneous Q24H   guaiFENesin  600 mg Oral BID   insulin aspart  0-15 Units Subcutaneous TID WC   insulin aspart  0-5 Units Subcutaneous QHS   ipratropium-albuterol  3 mL Nebulization Q6H   mouth rinse  15 mL Mouth Rinse BID   methylPREDNISolone (SOLU-MEDROL) injection  40 mg Intravenous Q12H   montelukast  10 mg Oral QHS   pantoprazole  40 mg Oral BID   propafenone  225 mg  Oral Q8H   tamsulosin  0.4 mg Oral Daily   Continuous Infusions:  remdesivir 100 mg in NS 100 mL Stopped (08/10/21 1105)    LOS: 3 days   Time spent: 35 minutes  Serge Main Wynetta Emery, MD How to contact the [facility] Attending or Consulting provider [place] or covering provider during after hours 7P -  7A, for this patient?  Check the care team in [facility] and look for a) attending/consulting [facility] provider listed and b) the [facility] team listed Log into [email or web address] and use [place]'s universal password to access. If you do not have the password, please contact the hospital operator. Locate the [facility] provider you are looking for under [facility] and page to a number that you can be directly reached. If you still have difficulty reaching the provider, please page the [facility] (Director on Call) for the Hospitalists listed on amion for assistance.

## 2021-08-11 NOTE — Plan of Care (Signed)

## 2021-08-11 NOTE — Progress Notes (Signed)
[facility]   Patient name: Daniel Barker Admit date: 08/08/2021  DOB: 1946-04-05 LOS: 3  MRN: [id number] Consult date: 08/09/2021  Referring provider: Dr. Heath Lark, [facility]: Short of breath    History:  75 yo male former smoker with severe COPD and respiratory failure with frequent hospital admissions presented to [facility] on 08/08/21 with shortness of breath.  He has been getting headache, cough, and leg swelling.  No fever, sputum, or wheeze.  Found to have acute on chronic hypoxia/hypercapnia and positive for COVID 19 infection.  Started on steroids, antibiotics, remdesivir and Bipap.  Past medical history:  COPD on home oxygen, DM type 2, Diastolic CHF, Hearing loss, A fib, CKD 3a  Significant events:  9/11 Admit 9/13 off Bipap  Studies:    Micro:  COVID 9/11 >> Positive  Lines:     Antibiotics:  Doxycycline 9/11 >> Remdesivir 9/12 >>   Consults:  Palliative care    Interim history:  Has cough and congestion, but hard to bring up phlegm.  He gets sinus pressure over left side of his face.  He says when this happens, his breathing will get worse.  Vital signs:  BP (!) 132/91   Pulse 66   Temp 98.1 F (36.7 C) (Oral)   Resp 13   Ht '5\' 6"'$  (1.676 m)   Wt 80.5 kg   SpO2 96%   BMI 28.64 kg/m   Intake/output:  I/O last 3 completed shifts: In: 342.4 [P.O.:240; IV Piggyback:102.4] Out: 1301 [Urine:1300; Stool:1]   Physical exam:   General - alert, sitting in chair Eyes - pupils reactive ENT - no sinus tenderness, no stridor, poor hearing Cardiac - regular rate/rhythm, no murmur Chest - scattered rhonchi that clears with cough Abdomen - soft, non tender, + bowel sounds Extremities - no cyanosis, clubbing, or edema Skin - no rashes Neuro - normal strength, moves extremities, follows commands Psych - normal mood and behavior         Best practice:   DVT - lovenox SUP - protonix Nutrition -  carb mod/heart healthy Mobility - bed rest   Assessment/plan:   Acute on chronic hypoxic/hypercapnic respiratory failure from COPD exacerbation secondary to COVID 19 infection. - day 4 of steroids >> change to prednisone on 9/15; might benefit from chronic low dose prednisone therapy - day 4 of doxycycline - day 3 of remdesivir - goal SpO2 90 to 95% - scheduled BDs - continue singulair - add flutter valve - continue mucinex  Allergic rhinitis with deviated nasal septum. - nasal irrigation, flonase, azelastine  History of chronic diastolic dysfx, atrial fibrillation. - continue plavix, rhythmol, lasix  DM type 2 poorly controlled with steroid induced hyperglycemia. - SSI  CKD 3a. - f/u BMET  Deconditioning. - PT consulted  Goals of care. - palliative care consulted  Resolved hospital problems:    Goals of care/Family discussions:  Code status: full code  Labs:   CMP Latest Ref Rng & Units 08/11/2021 08/10/2021 08/09/2021  Glucose 70 - 99 mg/dL 124(H) 130(H) 178(H)  BUN 8 - 23 mg/dL 42(H) 40(H) 36(H)  Creatinine 0.61 - 1.24 mg/dL 1.54(H) 1.66(H) 1.58(H)  Sodium 135 - 145 mmol/L 143 140 144  Potassium 3.5 - 5.1 mmol/L 4.6 3.9 4.5  Chloride 98 - 111 mmol/L 88(L) 86(L) 87(L)  CO2 22 - 32 mmol/L 45(H) 38(H) 46(H)  Calcium 8.9 - 10.3 mg/dL 9.2 8.4(L) 8.9  Total Protein 6.5 - 8.1 g/dL - - 5.7(L)  Total  Bilirubin 0.3 - 1.2 mg/dL - - 0.9  Alkaline Phos 38 - 126 U/L - - 37(L)  AST 15 - 41 U/L - - 14(L)  ALT 0 - 44 U/L - - 18    CBC Latest Ref Rng & Units 08/11/2021 08/10/2021 08/09/2021  WBC 4.0 - 10.5 K/uL 12.2(H) 15.5(H) 9.4  Hemoglobin 13.0 - 17.0 g/dL 11.1(L) 11.8(L) 11.4(L)  Hematocrit 39.0 - 52.0 % 36.4(L) 38.8(L) 36.6(L)  Platelets 150 - 400 K/uL 145(L) 167 160    ABG    Component Value Date/Time   PHART 7.349 (L) 08/08/2021 1705   PCO2ART 86.1 (HH) 08/08/2021 1705   PO2ART 68.7 (L) 08/08/2021 1705   HCO3 42.8 (H) 08/09/2021 0809   O2SAT 54.1 08/09/2021  0809    CBG (last 3)  Recent Labs    08/10/21 2006 08/11/21 0757 08/11/21 1119  GLUCAP 211* 163* 201*    Signature:  Chesley Mires, MD [place] Pager - [phone] 08/11/2021, 2:18 PM

## 2021-08-11 NOTE — Plan of Care (Signed)
  Problem: Acute Rehab PT Goals(only PT should resolve) Goal: Pt Will Go Supine/Side To Sit Outcome: Progressing Flowsheets (Taken 08/11/2021 1413) Pt will go Supine/Side to Sit: Independently Goal: Patient Will Transfer Sit To/From Stand Outcome: Progressing Flowsheets (Taken 08/11/2021 1413) Patient will transfer sit to/from stand: Independently Goal: Pt Will Transfer Bed To Chair/Chair To Bed Outcome: Progressing Flowsheets (Taken 08/11/2021 1413) Pt will Transfer Bed to Chair/Chair to Bed:  with modified independence  Independently Goal: Pt Will Ambulate Outcome: Progressing Flowsheets (Taken 08/11/2021 1413) Pt will Ambulate:  with modified independence  with least restrictive assistive device  75 feet   2:14 PM, 08/11/21 Lonell Grandchild, MPT Physical Therapist with [facility] [phone] office [phone] mobile phone

## 2021-08-12 LAB — GLUCOSE, CAPILLARY
Glucose-Capillary: 121 mg/dL — ABNORMAL HIGH (ref 70–99)
Glucose-Capillary: 101 mg/dL — ABNORMAL HIGH (ref 70–99)
Glucose-Capillary: 103 mg/dL — ABNORMAL HIGH (ref 70–99)

## 2021-08-12 LAB — BASIC METABOLIC PANEL
Anion gap: 5 (ref 5–15)
BUN: 37 mg/dL — ABNORMAL HIGH (ref 8–23)
CO2: 45 mmol/L — ABNORMAL HIGH (ref 22–32)
Calcium: 8.5 mg/dL — ABNORMAL LOW (ref 8.9–10.3)
Chloride: 91 mmol/L — ABNORMAL LOW (ref 98–111)
Creatinine, Ser: 1.27 mg/dL — ABNORMAL HIGH (ref 0.61–1.24)
GFR, Estimated: 59 mL/min — ABNORMAL LOW (ref >60)
Glucose, Bld: 149 mg/dL — ABNORMAL HIGH (ref 70–99)
Potassium: 4 mmol/L (ref 3.5–5.1)
Sodium: 141 mmol/L (ref 135–145)

## 2021-08-12 MED ORDER — PREDNISONE 10 MG PO TABS: ORAL_TABLET | ORAL | 0 refills | Status: DC

## 2021-08-12 MED ORDER — TORSEMIDE 20 MG PO TABS: 20.0000 mg | ORAL_TABLET | ORAL | 1 refills | Status: DC

## 2021-08-12 MED ORDER — BISOPROLOL FUMARATE 5 MG PO TABS
2.5000 mg | ORAL_TABLET | Freq: Every day | ORAL | 5 refills | Status: DC
Start: 2021-08-14 — End: 2022-01-05

## 2021-08-12 MED ORDER — MOMETASONE FURO-FORMOTEROL FUM 200-5 MCG/ACT IN AERO: 2.0000 | INHALATION_SPRAY | Freq: Two times a day (BID) | RESPIRATORY_TRACT | Status: DC

## 2021-08-12 MED ORDER — ALBUTEROL SULFATE (2.5 MG/3ML) 0.083% IN NEBU: 2.5000 mg | INHALATION_SOLUTION | RESPIRATORY_TRACT | Status: DC | PRN

## 2021-08-12 MED ORDER — UMECLIDINIUM BROMIDE 62.5 MCG/INH IN AEPB: 1.0000 | INHALATION_SPRAY | Freq: Every day | RESPIRATORY_TRACT | Status: DC

## 2021-08-12 NOTE — TOC Transition Note (Signed)
Transition of Care [facility]) - CM/SW Discharge Note   Patient Details  Name: Daniel Barker MRN: [id number] Date of Birth: 1946/01/13  [facility]) CM/SW Contact:  Ihor Gully, LCSW Phone Number: 08/12/2021, 4:16 PM   Clinical Narrative:    [facility] orders placed. Unable to find [employer], referrals to [place], [place], [place], [place], [place], [facility], [place], [place] all declined.  Patient referred to oppt.  Caryl Pina  with [employer] confirmed supply of patient's home oxygen notified of updated oxygen orders. [employer] will bring updated equipment. Patient referred to this [facility] Palliative services during this admission.      Barriers to Discharge: No Barriers Identified   Patient Goals and CMS Choice Patient states their goals for this hospitalization and ongoing recovery are:: return home   Choice offered to / list presented to : Patient  Discharge Placement                       Discharge Plan and Services In-house Referral: Clinical Social Work              DME Arranged: N/A                    Social Determinants of Health ([place]) Interventions     Readmission Risk Interventions Readmission Risk Prevention Plan 08/09/2021 07/20/2021 06/22/2021  Transportation Screening Complete Complete Complete  PCP or Specialist Appt within 5-7 Days - - -  Home Care Screening - - -  Medication Review ([place]) Complete Complete Complete  HRI or Home Care Consult Complete Complete Complete  SW Recovery Care/Counseling Consult Complete - Complete  [place] Not Applicable - Not Applicable

## 2021-08-12 NOTE — Discharge Summary (Addendum)
Physician Discharge Summary  Daniel Barker [id number] DOB: Mar 21, 1946 DOA: 08/08/2021  PCP: [name], MD Cardiologist Dr. [name] Pulmonologist  Dr. [name]   Admit date: 08/08/2021 Discharge date: 08/12/2021  Admitted From:  Home  Disposition: Home with [facility] (declined SNF)  Recommendations for Outpatient Follow-up:  Follow up with PCP in 1 weeks Follow up with cardiology in 2 weeks Follow up with pulmonology on 9/23 as scheduled Please consider ordering outpatient palliative care consultation  Home Health:  PT   Discharge Condition: STABLE   CODE STATUS: FULL DIET: heart healthy / carb modified    Brief Hospitalization Summary: Please see all hospital notes, images, labs for full details of the hospitalization. ADMISSION HPI: 75 yo WM with hx of severe COPD, chronic hypercapnic and hypoxic respiratory failure on home O2 at 2 L minute, diabetes, chronic diastolic heart failure, bilateral hearing loss, chronic A. fib not on anticoagulation due to patient's choice, CKD stage III presents the ER today with a 2-day history of worsening shortness of breath.  Patient states that he was at a picnic on Friday.  Start developing worsening shortness of breath on Saturday.  Came to the ER on Sunday.  Patient states he is not any fevers today but did feel feverish on Saturday.  Patient has had a cough with worsening shortness of breath.  He describes his sputum is somewhat yellow.  This is quite normal for him.  His breathing difficulties have increased over the weekend.   Patient states that he lives with his stepdaughter and his demented wife.  Patient states that is not following a low-salt diet at home.  He states that when he was discharged last time to the hospital, his legs were improved as far as edema.  They started swelling about 5 days after discharge.  Patient states that he eats deli meat every day in form of a sandwich for lunch.  He also eats hot dogs several times a week.  He has  never been counseled on a low-salt diet.  In the ER, patient noted to be hypoxic.  Submental O2 increased to 4 L a minute.  Chest X demonstrated COPD.   Laboratory findings include continued positive COVID-19 test.  He was also tested positive for COVID back in June 05, 2021.  ABG today pH 7.34 PCO2 of 86 PO2 of 68 on 4 L of oxygen.   white count 14.2.  BNP was elevated at 812, creatinine has increased to 1.62.  At discharge it was 1.21.  PCO2 is increased to 42.   Due to worsening hypoxia and COPD exacerbation, [facility] contacted for admission.    ED Course: start on IV solumedrol, IV lasix, po doxycycline.  HOSPITAL COURSE BY PROBLEM LIST   Acute on chronic hypoxemic and hypercapnic respiratory failure -Currently on 2 L nasal cannula oxygen, wears 2 L at bedtime at baseline -Likely secondary to COPD exacerbation due to COVID-19 infection -Appreciate pulmonology evaluation -Appreciate palliative evaluation for goals of care, patient desires full code -Taper prednisone down to daily dose that he was taking prior to admission    COVID-19 Infection - Pneumonia ruled out -he was treated with Remdesivir day 4 --No need for baricitinib or Actemra at this time - HOME O2 EVAL ORDERED PRIOR TO DISCHARGE   Acute COPD exacerbation - RESOLVED -treated with steroids,  doxycycline, DuoNebs every 6 hours   Chronic diastolic heart failure/atrial fibrillation -Patient has a high salt diet at home -Not on anticoagulation due to patient preference,  high bleeding risk  BRADYCARDIA - HR drops down to 40s and 50s.  Rhythmol was held due to bradycardia - Hold rhythmol for now due to severe bradycardia - bisoprolol dose reduced to 2.5 mg daily with holding parameters (HOLD FOR HR<65 or SBP<95) -outpatient follow up with PCP and cardiologist recommended   Type 2 diabetes with mild hyperglycemia-improved -steroid-induced -continue CBG monitoring and SSI coverage CBG (last 3)  Recent Labs     08/11/21 2120 08/12/21 0745 08/12/21 1125  GLUCAP 119* 121* 101*    CKD stage IIIa - stable  -Continue to monitor     DVT prophylaxis: Lovenox Code Status: Full Disposition Plan: declined SNF per social worker Ambrose Pancoast, home with home health ordered Status is: Inpatient   Discharge Diagnoses:  Principal Problem:   Acute on chronic respiratory failure with hypoxia and hypercapnia (HCC) - baseline PCO2 80-90s, chronically on 2 L/min at home. Active Problems:   Diabetes mellitus ([place])   Chronic atrial fibrillation (HCC) - not on systemic anticoagulants due to patient's choice.   Chronic kidney disease, stage 3a (HCC)   Chronic diastolic heart failure (HCC)   COPD exacerbation ([place])   Acute hypoxemic respiratory failure [facility])   Discharge Instructions:  Allergies as of 08/12/2021       Reactions   Lipitor [atorvastatin]    Unknown reaction        Medication List     STOP taking these medications    ascorbic acid 500 MG tablet Commonly known as: VITAMIN C   guaiFENesin-dextromethorphan 100-10 MG/5ML syrup Commonly known as: ROBITUSSIN DM   propafenone 225 MG tablet Commonly known as: RYTHMOL   Spiriva Respimat 2.5 MCG/ACT Aers Generic drug: Tiotropium Bromide Monohydrate       TAKE these medications    acetaminophen 500 MG tablet Commonly known as: TYLENOL Take 1,000 mg by mouth every 6 (six) hours as needed.   albuterol 108 (90 Base) MCG/ACT inhaler Commonly known as: VENTOLIN HFA INHALE 2 PUFFS BY MOUTH EVERY 4 HOURS AS NEEDED What changed:  how much to take how to take this when to take this reasons to take this additional instructions   albuterol (2.5 MG/3ML) 0.083% nebulizer solution Commonly known as: PROVENTIL Take 3 mLs (2.5 mg total) by nebulization every 6 (six) hours as needed for wheezing or shortness of breath. What changed: Another medication with the same name was changed. Make sure you understand how and when to take  each.   amLODipine 5 MG tablet Commonly known as: NORVASC Take 5 mg by mouth daily.   bisoprolol 5 MG tablet Commonly known as: ZEBETA Take 0.5 tablets (2.5 mg total) by mouth daily. HOLD FOR  HEART RATE LESS THAN 65 OR SBP LESS THAN 95 Start taking on: August 14, 2021 What changed:  how much to take when to take this additional instructions These instructions start on August 14, 2021. If you are unsure what to do until then, ask your doctor or other care provider.   budesonide-formoterol 160-4.5 MCG/ACT inhaler Commonly known as: Symbicort Take 2 puffs first thing in am and then another 2 puffs about 12 hours later. What changed:  how much to take how to take this when to take this   clopidogrel 75 MG tablet Commonly known as: PLAVIX Take 75 mg by mouth daily.   fenofibrate micronized 134 MG capsule Commonly known as: LOFIBRA Take 134 mg by mouth daily before breakfast.   montelukast 10 MG tablet Commonly known as: SINGULAIR Take 10  mg by mouth at bedtime.   pantoprazole 40 MG tablet Commonly known as: PROTONIX Take 1 tablet (40 mg total) by mouth 2 (two) times daily.   predniSONE 10 MG tablet Commonly known as: DELTASONE resume 1 tab daily Start taking on: August 13, 2021 What changed: additional instructions   tamsulosin 0.4 MG Caps capsule Commonly known as: FLOMAX TAKE 1 CAPSULE(0.4 MG) BY MOUTH DAILY What changed: See the new instructions.   torsemide 20 MG tablet Commonly known as: Demadex Take 1 tablet (20 mg total) by mouth every other day. Start taking on: August 16, 2021 What changed:  when to take this These instructions start on August 16, 2021. If you are unsure what to do until then, ask your doctor or other care provider. Another medication with the same name was removed. Continue taking this medication, and follow the directions you see here.   zinc sulfate 220 (50 Zn) MG capsule Take 1 capsule (220 mg total) by mouth  daily.               Durable Medical Equipment  (From admission, onward)           Start     Ordered   08/12/21 1507  For home use only DME oxygen  Once       Question Answer Comment  Length of Need 12 Months   Mode or (Route) Nasal cannula   Liters per Minute 2   Frequency Continuous (stationary and portable oxygen unit needed)   Oxygen conserving device Yes   Oxygen delivery system Gas      08/12/21 1506            Follow-up Information     [name], MD. Schedule an appointment as soon as possible for a visit.   Specialty: Internal Medicine Why: Hospital Follow Up Contact information: [address] [place] [facility] [address] [phone]         Josue Hector, MD. Schedule an appointment as soon as possible for a visit in 2 week(s).   Specialty: Cardiology Why: Hospital Follow Up for bradycardia Contact information: [address] [place] [address] [phone]                Allergies  Allergen Reactions   Lipitor [Atorvastatin]     Unknown reaction   Allergies as of 08/12/2021       Reactions   Lipitor [atorvastatin]    Unknown reaction        Medication List     STOP taking these medications    ascorbic acid 500 MG tablet Commonly known as: VITAMIN C   guaiFENesin-dextromethorphan 100-10 MG/5ML syrup Commonly known as: ROBITUSSIN DM   propafenone 225 MG tablet Commonly known as: RYTHMOL   Spiriva Respimat 2.5 MCG/ACT Aers Generic drug: Tiotropium Bromide Monohydrate       TAKE these medications    acetaminophen 500 MG tablet Commonly known as: TYLENOL Take 1,000 mg by mouth every 6 (six) hours as needed.   albuterol 108 (90 Base) MCG/ACT inhaler Commonly known as: VENTOLIN HFA INHALE 2 PUFFS BY MOUTH EVERY 4 HOURS AS NEEDED What changed:  how much to take how to take this when to take this reasons to take this additional instructions   albuterol (2.5 MG/3ML) 0.083%  nebulizer solution Commonly known as: PROVENTIL Take 3 mLs (2.5 mg total) by nebulization every 6 (six) hours as needed for wheezing or shortness of breath. What changed: Another  medication with the same name was changed. Make sure you understand how and when to take each.   amLODipine 5 MG tablet Commonly known as: NORVASC Take 5 mg by mouth daily.   bisoprolol 5 MG tablet Commonly known as: ZEBETA Take 0.5 tablets (2.5 mg total) by mouth daily. HOLD FOR  HEART RATE LESS THAN 65 OR SBP LESS THAN 95 Start taking on: August 14, 2021 What changed:  how much to take when to take this additional instructions These instructions start on August 14, 2021. If you are unsure what to do until then, ask your doctor or other care provider.   budesonide-formoterol 160-4.5 MCG/ACT inhaler Commonly known as: Symbicort Take 2 puffs first thing in am and then another 2 puffs about 12 hours later. What changed:  how much to take how to take this when to take this   clopidogrel 75 MG tablet Commonly known as: PLAVIX Take 75 mg by mouth daily.   fenofibrate micronized 134 MG capsule Commonly known as: LOFIBRA Take 134 mg by mouth daily before breakfast.   montelukast 10 MG tablet Commonly known as: SINGULAIR Take 10 mg by mouth at bedtime.   pantoprazole 40 MG tablet Commonly known as: PROTONIX Take 1 tablet (40 mg total) by mouth 2 (two) times daily.   predniSONE 10 MG tablet Commonly known as: DELTASONE resume 1 tab daily Start taking on: August 13, 2021 What changed: additional instructions   tamsulosin 0.4 MG Caps capsule Commonly known as: FLOMAX TAKE 1 CAPSULE(0.4 MG) BY MOUTH DAILY What changed: See the new instructions.   torsemide 20 MG tablet Commonly known as: Demadex Take 1 tablet (20 mg total) by mouth every other day. Start taking on: August 16, 2021 What changed:  when to take this These instructions start on August 16, 2021. If you are unsure  what to do until then, ask your doctor or other care provider. Another medication with the same name was removed. Continue taking this medication, and follow the directions you see here.   zinc sulfate 220 (50 Zn) MG capsule Take 1 capsule (220 mg total) by mouth daily.               Durable Medical Equipment  (From admission, onward)           Start     Ordered   08/12/21 1507  For home use only DME oxygen  Once       Question Answer Comment  Length of Need 12 Months   Mode or (Route) Nasal cannula   Liters per Minute 2   Frequency Continuous (stationary and portable oxygen unit needed)   Oxygen conserving device Yes   Oxygen delivery system Gas      08/12/21 1506            Procedures/Studies: DG Chest Port 1 View  Result Date: 08/08/2021 CLINICAL DATA:  Cough, short of breath EXAM: PORTABLE CHEST 1 VIEW COMPARISON:  07/18/2021 FINDINGS: Single frontal view of the chest demonstrates 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 The cardiac silhouette is unremarkable. Chronic scarring throughout the lungs without airspace disease, effusion, or pneumothorax. No acute bony abnormalities. Chronic healing left humeral fracture. IMPRESSION: 1. Stable chest, no acute process. Electronically Signed   By: Randa Ngo M.D.   On: 08/08/2021 16:04   Portable Chest xray  Result Date: 07/18/2021 CLINICAL DATA:  Endotracheal tube.  Asthma.  COPD.  Ex-smoker. EXAM: PORTABLE CHEST 1 VIEW COMPARISON:  Yesterday FINDINGS: Endotracheal tube terminates 4.9  cm above carina. Nasogastric tube terminates at the gastric cardia with the side port just below the gastroesophageal junction. Prior median sternotomy. Midline trachea. Normal heart size. Atherosclerosis in the transverse aorta. No pleural effusion or pneumothorax. Diffuse peribronchial thickening. Mild left base scarring or subsegmental atelectasis laterally. IMPRESSION: Endotracheal tube remains appropriately positioned. No acute  cardiopulmonary disease. Peribronchial thickening which may relate to chronic bronchitis or smoking. Electronically Signed   By: Abigail Miyamoto M.D.   On: 07/18/2021 05:32   DG Chest Portable 1 View  Result Date: 07/17/2021 CLINICAL DATA:  Dyspnea. EXAM: PORTABLE CHEST 1 VIEW COMPARISON:  07/02/2021 FINDINGS: Heart size is normal. There is linear atelectasis or scarring at the LEFT lung base, stable in appearance. No new consolidations or pulmonary edema. IMPRESSION: Stable LEFT LOWER lobe atelectasis or scarring. Electronically Signed   By: Nolon Nations M.D.   On: 07/17/2021 12:40   DG Chest Port 1V same Day  Result Date: 07/17/2021 CLINICAL DATA:  OG tube placement. EXAM: PORTABLE CHEST 1 VIEW COMPARISON:  07/17/2021 FINDINGS: Endotracheal tube is in place, approximately 5 centimeters above the carina. Orogastric tube is in place, tip overlying the level of the stomach, side port in the proximal stomach. Median sternotomy.  Heart size is normal.  Lungs are clear. IMPRESSION: Interval placement of orogastric tube. No evidence for acute cardiopulmonary abnormality. Electronically Signed   By: Nolon Nations M.D.   On: 07/17/2021 18:33   DG Chest Port 1V same Day  Result Date: 07/17/2021 CLINICAL DATA:  Postablation EXAM: PORTABLE CHEST 1 VIEW COMPARISON:  07/17/2021 FINDINGS: Postoperative changes in the mediastinum. An endotracheal tube is present with tip measuring about 1.5 cm above the carina. Enteric tube with tip in the left upper quadrant consistent with location in the upper stomach. Heart size and pulmonary vascularity are normal. No airspace disease or consolidation in the lungs. Linear scarring or atelectasis suggested in the left base. Similar appearance to previous study. No pleural effusions. No pneumothorax. Calcification of the aorta. IMPRESSION: Appliances appear in satisfactory position. Linear scarring or atelectasis in the left lung base similar to prior study. Otherwise no  change. Electronically Signed   By: Lucienne Capers M.D.   On: 07/17/2021 17:29     Subjective: Pt without complaints.  He says that he does feel better, he is not agreeable to SNF but would accept home health.   Discharge Exam: Vitals:   08/12/21 1200 08/12/21 1444  BP:    Pulse:    Resp: 18   Temp:    SpO2:  93%   Vitals:   08/12/21 1000 08/12/21 1100 08/12/21 1200 08/12/21 1444  BP: (!) 162/53 (!) 149/57    Pulse: 70 69    Resp: 17  18   Temp:  97.9 F (36.6 C)    TempSrc:  Oral    SpO2: 97% 95%  93%  Weight:      Height:       General exam: awake, alert, cooperative, NAD.  Respiratory system: no increased work of breathing.  Cardiovascular system: normal s1, s2 sounds.    Gastrointestinal system: abd soft, ND/NT, no HSM.  Central nervous system: nonfocal exam.  Extremities: no C/C/E.  Skin: No gross lesions seen.  Psychiatry: normal affect today.    The results of significant diagnostics from this hospitalization (including imaging, microbiology, ancillary and laboratory) are listed below for reference.     Microbiology: Recent Results (from the past 240 hour(s))  Resp Panel by RT-PCR (Flu A&B,  Covid) Nasopharyngeal Swab     Status: Abnormal   Collection Time: 08/08/21  3:00 PM   Specimen: Nasopharyngeal Swab; Nasopharyngeal(NP) swabs in vial transport medium  Result Value Ref Range Status   SARS Coronavirus 2 by RT PCR POSITIVE (A) NEGATIVE Final    Comment: CRITICAL RESULT CALLED TO, READ BACK BY AND VERIFIED WITH: Sharyon Cable 1624 08/08/2021 COLEMAN,R (NOTE) SARS-CoV-2 target nucleic acids are DETECTED.  The SARS-CoV-2 RNA is generally detectable in upper respiratory specimens during the acute phase of infection. Positive results are indicative of the presence of the identified virus, but do not rule out bacterial infection or co-infection with other pathogens not detected by the test. Clinical correlation with patient history and other diagnostic  information is necessary to determine patient infection status. The expected result is Negative.  Fact Sheet for Patients: EntrepreneurPulse.com.au  Fact Sheet for Healthcare Providers: IncredibleEmployment.be  This test is not yet approved or cleared by the [place] FDA and  has been authorized for detection and/or diagnosis of SARS-CoV-2 by FDA under an Emergency Use Authorization (EUA).  This EUA will remain in effect (meaning this t est can be used) for the duration of  the COVID-19 declaration under Section 564(b)(1) of the Act, 21 U.S.C. section 360bbb-3(b)(1), unless the authorization is terminated or revoked sooner.     Influenza A by PCR NEGATIVE NEGATIVE Final   Influenza B by PCR NEGATIVE NEGATIVE Final    Comment: (NOTE) The Xpert Xpress SARS-CoV-2/FLU/RSV plus assay is intended as an aid in the diagnosis of influenza from Nasopharyngeal swab specimens and should not be used as a sole basis for treatment. Nasal washings and aspirates are unacceptable for Xpert Xpress SARS-CoV-2/FLU/RSV testing.  Fact Sheet for Patients: EntrepreneurPulse.com.au  Fact Sheet for Healthcare Providers: IncredibleEmployment.be  This test is not yet approved or cleared by the [place] FDA and has been authorized for detection and/or diagnosis of SARS-CoV-2 by FDA under an Emergency Use Authorization (EUA). This EUA will remain in effect (meaning this test can be used) for the duration of the COVID-19 declaration under Section 564(b)(1) of the Act, 21 U.S.C. section 360bbb-3(b)(1), unless the authorization is terminated or revoked.  Performed at [facility], [address], [place], [place] [address]      Labs: BNP (last 3 results) Recent Labs    07/02/21 2230 07/17/21 1226 08/08/21 1510  BNP 302.0* 638.0* [id number]*   Basic Metabolic Panel: Recent Labs  Lab 08/08/21 1510 08/09/21 0505  08/10/21 0436 08/11/21 0454 08/12/21 0450  NA 141 144 140 143 141  K 4.6 4.5 3.9 4.6 4.0  CL 88* 87* 86* 88* 91*  CO2 42* 46* 38* 45* 45*  GLUCOSE 174* 178* 130* 124* 149*  BUN 35* 36* 40* 42* 37*  CREATININE 1.62* 1.58* 1.66* 1.54* 1.27*  CALCIUM 9.2 8.9 8.4* 9.2 8.5*  MG  --  2.4 2.3 2.2  --    Liver Function Tests: Recent Labs  Lab 08/08/21 1510 08/09/21 0505  AST 17 14*  ALT 19 18  ALKPHOS 43 37*  BILITOT 0.9 0.9  PROT 7.0 5.7*  ALBUMIN 3.9 3.2*   No results for input(s): LIPASE, AMYLASE in the last 168 hours. No results for input(s): AMMONIA in the last 168 hours. CBC: Recent Labs  Lab 08/08/21 1510 08/09/21 0505 08/10/21 0436 08/11/21 0454  WBC 14.2* 9.4 15.5* 12.2*  NEUTROABS 13.0* 8.2*  --   --   HGB 12.3* 11.4* 11.8* 11.1*  HCT 40.4 36.6* 38.8* 36.4*  MCV  103.9* 103.4* 102.9* 102.2*  PLT 180 160 167 145*   Cardiac Enzymes: No results for input(s): CKTOTAL, CKMB, CKMBINDEX, TROPONINI in the last 168 hours. BNP: Invalid input(s): POCBNP CBG: Recent Labs  Lab 08/11/21 1119 08/11/21 1710 08/11/21 2120 08/12/21 0745 08/12/21 1125  GLUCAP 201* 93 119* 121* 101*   D-Dimer No results for input(s): DDIMER in the last 72 hours. Hgb A1c No results for input(s): HGBA1C in the last 72 hours. Lipid Profile No results for input(s): CHOL, HDL, LDLCALC, TRIG, CHOLHDL, LDLDIRECT in the last 72 hours. Thyroid function studies No results for input(s): TSH, T4TOTAL, T3FREE, THYROIDAB in the last 72 hours.  Invalid input(s): FREET3 Anemia work up No results for input(s): VITAMINB12, FOLATE, FERRITIN, TIBC, IRON, RETICCTPCT in the last 72 hours. Urinalysis    Component Value Date/Time   COLORURINE YELLOW 07/17/2021 [place] 07/17/2021 1614   LABSPEC 1.015 07/17/2021 1614   PHURINE 5.0 07/17/2021 1614   GLUCOSEU NEGATIVE 07/17/2021 1614   HGBUR NEGATIVE 07/17/2021 [place] 07/17/2021 [place]  07/17/2021 1614   PROTEINUR 100 (A) 07/17/2021 1614   NITRITE NEGATIVE 07/17/2021 1614   LEUKOCYTESUR NEGATIVE 07/17/2021 1614   Sepsis Labs Invalid input(s): PROCALCITONIN,  WBC,  LACTICIDVEN Microbiology Recent Results (from the past 240 hour(s))  Resp Panel by RT-PCR (Flu A&B, Covid) Nasopharyngeal Swab     Status: Abnormal   Collection Time: 08/08/21  3:00 PM   Specimen: Nasopharyngeal Swab; Nasopharyngeal(NP) swabs in vial transport medium  Result Value Ref Range Status   SARS Coronavirus 2 by RT PCR POSITIVE (A) NEGATIVE Final    Comment: CRITICAL RESULT CALLED TO, READ BACK BY AND VERIFIED WITH: Sharyon Cable 1624 08/08/2021 COLEMAN,R (NOTE) SARS-CoV-2 target nucleic acids are DETECTED.  The SARS-CoV-2 RNA is generally detectable in upper respiratory specimens during the acute phase of infection. Positive results are indicative of the presence of the identified virus, but do not rule out bacterial infection or co-infection with other pathogens not detected by the test. Clinical correlation with patient history and other diagnostic information is necessary to determine patient infection status. The expected result is Negative.  Fact Sheet for Patients: EntrepreneurPulse.com.au  Fact Sheet for Healthcare Providers: IncredibleEmployment.be  This test is not yet approved or cleared by the [place] FDA and  has been authorized for detection and/or diagnosis of SARS-CoV-2 by FDA under an Emergency Use Authorization (EUA).  This EUA will remain in effect (meaning this t est can be used) for the duration of  the COVID-19 declaration under Section 564(b)(1) of the Act, 21 U.S.C. section 360bbb-3(b)(1), unless the authorization is terminated or revoked sooner.     Influenza A by PCR NEGATIVE NEGATIVE Final   Influenza B by PCR NEGATIVE NEGATIVE Final    Comment: (NOTE) The Xpert Xpress SARS-CoV-2/FLU/RSV plus assay is intended as an aid in  the diagnosis of influenza from Nasopharyngeal swab specimens and should not be used as a sole basis for treatment. Nasal washings and aspirates are unacceptable for Xpert Xpress SARS-CoV-2/FLU/RSV testing.  Fact Sheet for Patients: EntrepreneurPulse.com.au  Fact Sheet for Healthcare Providers: IncredibleEmployment.be  This test is not yet approved or cleared by the [place] FDA and has been authorized for detection and/or diagnosis of SARS-CoV-2 by FDA under an Emergency Use Authorization (EUA). This EUA will remain in effect (meaning this test can be used) for the duration of the COVID-19 declaration under Section 564(b)(1) of the Act, 21 U.S.C. section 360bbb-3(b)(1), unless  the authorization is terminated or revoked.  Performed at [facility], [address], [place], [place] [address]     Time coordinating discharge: 36 mins   SIGNED:  Irwin Brakeman, MD  [facility] 08/12/2021, 3:16 PM How to contact the [facility] Attending or Consulting provider [place] or covering provider during after hours [place], for this patient?  Check the care team in [facility] and look for a) attending/consulting [facility] provider listed and b) the [facility] team listed Log into [email or web address] and use [place]'s universal password to access. If you do not have the password, please contact the hospital operator. Locate the [facility] provider you are looking for under [facility] and page to a number that you can be directly reached. If you still have difficulty reaching the provider, please page the [facility] (Director on Call) for the Hospitalists listed on amion for assistance.

## 2021-08-12 NOTE — Progress Notes (Signed)
[facility]   Patient name: Daniel Barker Admit date: 08/08/2021  DOB: [birth year] LOS: 4  MRN: [id number] Consult date: 08/09/2021  Referring provider: Dr. Heath Lark, [facility]: Short of breath    History:  75 yo male former smoker with severe COPD and respiratory failure with frequent hospital admissions presented to [facility] on 08/08/21 with shortness of breath.  He has been getting headache, cough, and leg swelling.  No fever, sputum, or wheeze.  Found to have acute on chronic hypoxia/hypercapnia and positive for COVID 19 infection.  Started on steroids, antibiotics, remdesivir and Bipap.  Past medical history:  COPD on home oxygen, DM type 2, Diastolic CHF, Hearing loss, A fib, CKD 3a  Significant events:  9/11 Admit 9/13 off Bipap  Studies:    Micro:  COVID 9/11 >> Positive  Lines:     Antibiotics:  Doxycycline 9/11 >> Remdesivir 9/12 >>   Consults:  Palliative care    Interim history:  Sinus congestion better.  Feels strength improved.  Still congested in his chest, but feels like breathing improved.  Vital signs:  BP (!) 149/57   Pulse 69   Temp 97.9 F (36.6 C) (Oral)   Resp 18   Ht '5\' 6"'$  (1.676 m)   Wt 80.5 kg   SpO2 93%   BMI 28.64 kg/m on 2 liters oxygen  Intake/output:  I/O last 3 completed shifts: In: 700 [P.O.:600; IV Piggyback:100] Out: 2175 [Urine:2175]   Physical exam:   General - alert, sitting in chair Eyes - pupils reactive ENT - no sinus tenderness, no stridor Cardiac - regular rate/rhythm, no murmur Chest - scattered rhonchi that clear with cough Abdomen - soft, non tender, + bowel sounds Extremities - no cyanosis, clubbing, or edema Skin - no rashes Neuro - normal strength, moves extremities, follows commands Psych - normal mood and behavior            Best practice:   DVT - lovenox SUP - protonix Nutrition - carb mod/heart healthy Mobility - as tolerated    Assessment/plan:   Acute on chronic hypoxic/hypercapnic respiratory failure from COPD exacerbation secondary to COVID 19 infection. - day 5 of steroids >> wean down to 5 mg daily, and then keep him on this until he has outpt pulmonary follow up - day 5 of doxycycline - day 4 of remdesivir - goal SpO2 90 to 95% - resume symbicort, spiriva - continue singulair, mucinex, flutter valve - mobilize as tolerated  Allergic rhinitis with deviated nasal septum. - continue nasal irrigation, flonase, azelastine  History of chronic diastolic dysfx, atrial fibrillation. - continue plavix, rhythmol, lasix  DM type 2 poorly controlled with steroid induced hyperglycemia. - SSI  CKD 3a. - f/u BMET intermittently  Deconditioning. - PT recommending home health  Goals of care. - palliative care consulted  Disposition. - he has pulmonary office follow up with Dr. [name] on 08/20/21.  PCCM will sign off. Please call if additional help needed while he is in hospital.  D/w Dr. Wynetta Emery.  Resolved hospital problems:    Goals of care/Family discussions:  Code status: full code  Labs:   CMP Latest Ref Rng & Units 08/12/2021 08/11/2021 08/10/2021  Glucose 70 - 99 mg/dL 149(H) 124(H) 130(H)  BUN 8 - 23 mg/dL 37(H) 42(H) 40(H)  Creatinine 0.61 - 1.24 mg/dL 1.27(H) 1.54(H) 1.66(H)  Sodium 135 - 145 mmol/L 141 143 140  Potassium 3.5 - 5.1 mmol/L 4.0 4.6 3.9  Chloride 98 - 111  mmol/L 91(L) 88(L) 86(L)  CO2 22 - 32 mmol/L 45(H) 45(H) 38(H)  Calcium 8.9 - 10.3 mg/dL 8.5(L) 9.2 8.4(L)  Total Protein 6.5 - 8.1 g/dL - - -  Total Bilirubin 0.3 - 1.2 mg/dL - - -  Alkaline Phos 38 - 126 U/L - - -  AST 15 - 41 U/L - - -  ALT 0 - 44 U/L - - -    CBC Latest Ref Rng & Units 08/11/2021 08/10/2021 08/09/2021  WBC 4.0 - 10.5 K/uL 12.2(H) 15.5(H) 9.4  Hemoglobin 13.0 - 17.0 g/dL 11.1(L) 11.8(L) 11.4(L)  Hematocrit 39.0 - 52.0 % 36.4(L) 38.8(L) 36.6(L)  Platelets 150 - 400 K/uL 145(L) 167 160    ABG     Component Value Date/Time   PHART 7.349 (L) 08/08/2021 1705   PCO2ART 86.1 (HH) 08/08/2021 1705   PO2ART 68.7 (L) 08/08/2021 1705   HCO3 42.8 (H) 08/09/2021 0809   O2SAT 54.1 08/09/2021 0809    CBG (last 3)  Recent Labs    08/11/21 2120 08/12/21 0745 08/12/21 1125  GLUCAP 119* 121* 101*    Signature:  Chesley Mires, MD [place] Pager - [phone] 08/12/2021, 3:03 PM

## 2021-08-12 NOTE — Progress Notes (Signed)
SATURATION QUALIFICATIONS: (This note is used to comply with regulatory documentation for home oxygen)  Patient Saturations on Room Air at Rest = 84%  Patient Saturations on Room Air while Ambulating = 82%  Patient Saturations on 2 Liters of oxygen while Ambulating = 94%  Please briefly explain why patient needs home oxygen: Pt oxygen saturation dropped without oxygen on at rest and while ambulating.

## 2021-08-13 ENCOUNTER — Other Ambulatory Visit: Payer: Self-pay

## 2021-08-13 ENCOUNTER — Other Ambulatory Visit: Payer: Self-pay | Admitting: *Deleted

## 2021-08-13 NOTE — Patient Outreach (Signed)
[facility]) Care Management  08/13/2021  Daniel Barker 1946/01/27 [id number]  [facility] outreach to complex care patient    Mr Daniel Barker was referred to [facility] on 05/23/21 by [facility] hospital liaison  Referral Reason: post hospital/complex care  Insurance: united healthcare medicare     Patient is able to verify HIPAA ([place] and Accountability Act) identifiers    Consent: [facility] ([place]) RN CM reviewed [facility] services with patient. Patient gave verbal consent for services.     Assessment  RN CM welcomed Mr Daniel Barker home  He thanked RN CM and stated "I guess. I don't know anymore" Assessed for worsening symptoms and he states he feels fine except some productive clear sputum  Follow up with Dr [name] Pulmonologist noted in [facility] for 08/20/21  RN CM reviewed the purpose of the follow up call with the patient- review of admission, transition to home, assist with [facility] [place])    Mr Daniel Barker informs RN CM he has recently "gotten up and I'm in the bathroom" Permission was given by pt today to speak with his Step daughter, [name] He reports she is at work but it is best 12-1 pm "I don't understand a lot of this stuff and she is helping me" "I get lots of calls and automatic calls I don't under stand" He was allowed to ventilate his feelings Mr Daniel Barker confirms Hard of hearing ([place]) and knowledge barriers  Prior to concluding the call, RN CM reviewed importance of a low sodium diet He reports he does not understand the low sodium diet and continues to eat what he has in the home (no special diet precautions) RN CM to review low sodium diet with [name] and send EMMI education Post hospital [facility] meal reviewed with him and he agrees to services  Covid- His positive episodes reviewed (05/18/21, 06/05/21 & 08/08/21)  RN CM assessed and reviewed covid precautions (masks, 6 feet distance, hand washing) plus his risks  related to COPD He states he follow precautions but also informed RN CM he has been informed that covid stays in the system for months He confirms with RN CM that no one in the home has tested positive He reports his step daughter informed him she recently tested negative His wife with dementia does not leave the home per pt but he is not sure if anyone coming in may have tested positive  He did recently attend a picnic on 08/06/21 He confirms [name] has home free covid kit  RN CM suggested his wife have a covid test for preventative measures  Palliative care He voices he is not sure if he is to have outpatient palliative care visits No palliative care appointment noted in [facility]   Patient Active Problem List   Diagnosis Date Noted   Acute hypoxemic respiratory failure ([place]) 08/09/2021   Acute respiratory failure with hypoxia and hypercarbia ([place]) 07/17/2021   COPD exacerbation ([place]) 07/17/2021   Chronic kidney disease, stage 3a ([place]) 07/03/2021   Chronic diastolic heart failure ([place]) 07/03/2021   History of fracture 07/03/2021   SOB (shortness of breath) 06/05/2021   Elevated troponin 06/05/2021   Acute on chronic respiratory failure with hypoxia and hypercapnia ([place]) - baseline PCO2 80-90s, chronically on 2 L/min at home. 05/19/2021   COVID-19 virus infection 05/18/2021   Essential tremor 05/04/2021   Chronic atrial fibrillation ([place]) - not on systemic anticoagulants due to patient's choice. 04/25/2021   Chronic kidney disease 04/25/2021  Asthma 04/25/2021   H/O coronary angioplasty 04/25/2021   Impaired fasting glucose 04/25/2021   Cellulitis 04/25/2021   Edema 04/25/2021   Nicotine dependence 04/25/2021   Overweight 04/25/2021   Prediabetes 04/25/2021   Tremor 04/25/2021   Vitamin D deficiency 04/25/2021   Mixed hyperlipidemia 04/16/2021   Closed displaced spiral fracture of shaft of left humerus 12/23/2020   Chronic hypoxemic respiratory failure ([place]) 09/02/2020   Urinary  retention 07/08/2020   Benign prostatic hyperplasia with lower urinary tract symptoms 07/08/2020   COPD GOLD IV  05/28/2020   Cigarette smoker 05/28/2020   Anemia of chronic disease 06/03/2014   Atherosclerosis of coronary artery without angina pectoris 06/03/2014   Acute respiratory failure with hypercapnia ([place]) 06/01/2014   Diabetes mellitus ([place]) 06/01/2014   Low blood pressure 06/01/2014   Past Medical History:  Diagnosis Date   Acute metabolic encephalopathy 6/73/4193   Asthma    Atrial fibrillation (HCC)    BPH (benign prostatic hyperplasia)    Cigarette smoker    CKD (chronic kidney disease)    COPD (chronic obstructive pulmonary disease) (HCC)    Coronary artery disease    Cough    High cholesterol    Hx of CABG    Hypercholesteremia    Hypertension    Localized edema    Stented coronary artery    Current Outpatient Medications on File Prior to Visit  Medication Sig Dispense Refill   acetaminophen (TYLENOL) 500 MG tablet Take 1,000 mg by mouth every 6 (six) hours as needed.     albuterol (PROVENTIL) (2.5 MG/3ML) 0.083% nebulizer solution Take 3 mLs (2.5 mg total) by nebulization every 6 (six) hours as needed for wheezing or shortness of breath. 150 mL 2   albuterol (VENTOLIN HFA) 108 (90 Base) MCG/ACT inhaler INHALE 2 PUFFS BY MOUTH EVERY 4 HOURS AS NEEDED (Patient taking differently: Inhale 1-2 puffs into the lungs every 4 (four) hours as needed for wheezing or shortness of breath.) 8.5 g 3   amLODipine (NORVASC) 5 MG tablet Take 5 mg by mouth daily.     [START ON 08/14/2021] bisoprolol (ZEBETA) 5 MG tablet Take 0.5 tablets (2.5 mg total) by mouth daily. HOLD FOR  HEART RATE LESS THAN 65 OR SBP LESS THAN 95 60 tablet 5   budesonide-formoterol (SYMBICORT) 160-4.5 MCG/ACT inhaler Take 2 puffs first thing in am and then another 2 puffs about 12 hours later. (Patient taking differently: Inhale 2 puffs into the lungs every 12 (twelve) hours. Take 2 puffs first thing in am and  then another 2 puffs about 12 hours later.) 1 each 11   clopidogrel (PLAVIX) 75 MG tablet Take 75 mg by mouth daily.     fenofibrate micronized (LOFIBRA) 134 MG capsule Take 134 mg by mouth daily before breakfast.     montelukast (SINGULAIR) 10 MG tablet Take 10 mg by mouth at bedtime.     pantoprazole (PROTONIX) 40 MG tablet Take 1 tablet (40 mg total) by mouth 2 (two) times daily. 60 tablet 1   predniSONE (DELTASONE) 10 MG tablet resume 1 tab daily 100 tablet 0   tamsulosin (FLOMAX) 0.4 MG CAPS capsule TAKE 1 CAPSULE(0.4 MG) BY MOUTH DAILY (Patient taking differently: Take 0.4 mg by mouth daily.) 30 capsule 11   [START ON 08/16/2021] torsemide (DEMADEX) 20 MG tablet Take 1 tablet (20 mg total) by mouth every other day. 30 tablet 1   zinc sulfate 220 (50 Zn) MG capsule Take 1 capsule (220 mg total) by mouth daily.  30 capsule 1   No current facility-administered medications on file prior to visit.    Plans Patient agrees to care plan, permission for RN CM to outreach to [place] provided and follow up within the next 7-14 business days    Wylene Weissman L. Lavina Hamman, RN, BSN, [place] Coordinator Office number [phone] Main [facility] number [phone] Fax number [phone]

## 2021-08-13 NOTE — Patient Outreach (Addendum)
[facility]) Care Management  08/13/2021  Uziel Caplette 11-Aug-1946 [id number]   [facility] outreach to complex care patient   Mr Lillie Riche was referred to [facility] on 05/23/21 by [facility] hospital liaison  Referral Reason: post hospital/complex care  Insurance: united healthcare medicare Since that time he has had further admissions for COPD exacerbation and covid+  RN CM spoke with him early in 08/13/21 and he gave permission for an outreach to [name] his step daughter during her lunch hour 12-1 pm     Outreach to [place] and she is able to verify Mr Lingle's HIPAA identifiers   Assessment RN CM reviewed the initial referral for pt and further outreach calls after admissions Reviewed the outreach on 08/13/21 with the pt  [name] shares that the main concerns with Mr Nabozny is his decline in health, frequent shortness of breath (sob) which leads to his inability to care for his demented wife, increasing hospital bills and resources for home care [name] is reporting she is noting memory issues with pt  Support system  [name] confirms at this time there assistance from her sister/Mrs Clowers's daughter to assist in Mrs Bejarano's care and some for Mr Corbin prn while she works. The sister form [place] will be visiting for another month  [name] reports Mrs Bodley is basically able to care for herself but need supervision to prevent falls and a reminder to stay seated [name] reports if Mrs Mccullar falls in the home, Mr Weishaupt is not able to get her up [name] works 20 minutes away from the home [name] has not been able to find affordable Personal care services (PCS)  RN CM suggests she try the local Department of Social Services (DSS) and to apply online if needed for services for both Mr & Mrs Wakeem Bialecki offered her e-mail for information to be sent to her by RN CM - entered in [place] agrees to the care plan and follow up within the next 30 business  days She will be sent EMMI education on dementia: caregiver, intro to an ACO, copd, palliative care, advance directives, getting the care you need   Breiana Stratmann L. Lavina Hamman, RN, BSN, [place] Coordinator Office number [phone] Main [facility] number [phone] Fax number [phone]

## 2021-08-18 DIAGNOSIS — I482 Chronic atrial fibrillation, unspecified: Secondary | ICD-10-CM | POA: Diagnosis not present

## 2021-08-18 DIAGNOSIS — J9621 Acute and chronic respiratory failure with hypoxia: Secondary | ICD-10-CM | POA: Diagnosis not present

## 2021-08-18 DIAGNOSIS — N1831 Chronic kidney disease, stage 3a: Secondary | ICD-10-CM | POA: Diagnosis not present

## 2021-08-18 DIAGNOSIS — T380X5A Adverse effect of glucocorticoids and synthetic analogues, initial encounter: Secondary | ICD-10-CM | POA: Insufficient documentation

## 2021-08-18 DIAGNOSIS — R739 Hyperglycemia, unspecified: Secondary | ICD-10-CM | POA: Diagnosis not present

## 2021-08-18 DIAGNOSIS — I5032 Chronic diastolic (congestive) heart failure: Secondary | ICD-10-CM | POA: Diagnosis not present

## 2021-08-18 DIAGNOSIS — U099 Post covid-19 condition, unspecified: Secondary | ICD-10-CM | POA: Diagnosis not present

## 2021-08-18 DIAGNOSIS — R001 Bradycardia, unspecified: Secondary | ICD-10-CM | POA: Diagnosis not present

## 2021-08-18 DIAGNOSIS — J449 Chronic obstructive pulmonary disease, unspecified: Secondary | ICD-10-CM | POA: Diagnosis not present

## 2021-08-19 ENCOUNTER — Other Ambulatory Visit: Payer: Self-pay | Admitting: *Deleted

## 2021-08-19 NOTE — Patient Outreach (Signed)
[facility]) Care Management  08/19/2021  Daniel Barker 1946/09/20 [id number]   [place] coordination- DSS collaboration, county resources  Outreach to [place] (DSS) [phone] Spoke with Jeannetta Nap to have a medicaid application sent out to [name] in the mail Re sent letter with [place] resources to include [employer] [place]) , personal care resources to [place] (previously sent to pt)   Plan [facility] RN CM will follow up with patient within the next 30 business days  Tiphany Fayson L. Lavina Hamman, RN, BSN, [place] Coordinator Office number [phone] Mobile number [phone]  Main [facility] number [phone] Fax number [phone]

## 2021-08-20 ENCOUNTER — Other Ambulatory Visit: Payer: Self-pay

## 2021-08-20 ENCOUNTER — Ambulatory Visit (INDEPENDENT_AMBULATORY_CARE_PROVIDER_SITE_OTHER): Payer: Medicare Other | Admitting: Internal Medicine

## 2021-08-20 ENCOUNTER — Encounter: Payer: Self-pay | Admitting: Internal Medicine

## 2021-08-20 ENCOUNTER — Telehealth: Payer: Self-pay | Admitting: Internal Medicine

## 2021-08-20 DIAGNOSIS — J449 Chronic obstructive pulmonary disease, unspecified: Secondary | ICD-10-CM | POA: Diagnosis not present

## 2021-08-20 DIAGNOSIS — J9612 Chronic respiratory failure with hypercapnia: Secondary | ICD-10-CM | POA: Diagnosis not present

## 2021-08-20 DIAGNOSIS — J9611 Chronic respiratory failure with hypoxia: Secondary | ICD-10-CM | POA: Diagnosis not present

## 2021-08-20 MED ORDER — SPIRIVA RESPIMAT 2.5 MCG/ACT IN AERS: 2.0000 | INHALATION_SPRAY | Freq: Every day | RESPIRATORY_TRACT | 0 refills | Status: DC

## 2021-08-20 MED ORDER — SPIRIVA RESPIMAT 2.5 MCG/ACT IN AERS
2.0000 | INHALATION_SPRAY | Freq: Every day | RESPIRATORY_TRACT | 0 refills | Status: DC
Start: 2021-08-20 — End: 2021-09-20

## 2021-08-20 MED ORDER — FLUTICASONE PROPIONATE 50 MCG/ACT NA SUSP: 2.0000 | Freq: Two times a day (BID) | NASAL | 2 refills | Status: DC

## 2021-08-20 MED ORDER — SPIRIVA RESPIMAT 2.5 MCG/ACT IN AERS: INHALATION_SPRAY | RESPIRATORY_TRACT | Status: DC
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
## 2021-08-20 NOTE — Telephone Encounter (Signed)
Pt was given sinus spray in the hospital but didn't have any to bring home.  Wanted to see if something could be called in to the pharmacy.   Please advise.

## 2021-08-20 NOTE — Patient Instructions (Addendum)
Make sure you check your oxygen saturation  at your highest level of activity  to be sure it stays over 88-92%  and adjust  02 flow upward to maintain this level if needed but remember to turn it back to previous settings when you stop (to conserve your supply).   Plan A = Automatic = Always=   Symbicort 160 Take 2 puffs first thing in am and then another 2 puffs about 12 hours later and add spiriva 2.5  x  one puff only in am and take prednisone 10  mg each am   Plan B = Backup (to supplement plan A, not to replace it) Only use your albuterol inhaler as a rescue medication to be used if you can't catch your breath by resting or doing a relaxed purse lip breathing pattern.  - The less you use it, the better it will work when you need it. - Ok to use the inhaler up to 2 puffs  every 4 hours if you must but call for appointment if use goes up over your usual need - Don't leave home without it !!  (think of it like the spare tire for your car)   Plan C = Crisis (instead of Plan B but only if Plan B stops working) - only use your albuterol nebulizer if you first try Plan B and it fails to help > ok to use the nebulizer up to every 4 hours but if start needing it regularly call for immediate appointment   Plan D = Double prednisone 10 mg  x 2 with breakfast until better then back to one daily    Please schedule a follow up office visit in 6 weeks, call sooner if needed

## 2021-08-20 NOTE — Telephone Encounter (Signed)
Prescription sent in. ATC patient to let him know. No answer.

## 2021-08-21 ENCOUNTER — Encounter: Payer: Self-pay | Admitting: Internal Medicine

## 2021-08-21 DIAGNOSIS — J449 Chronic obstructive pulmonary disease, unspecified: Secondary | ICD-10-CM | POA: Diagnosis not present

## 2021-08-21 NOTE — Assessment & Plan Note (Addendum)
[facility]   06/08/19  = 37 - ono RA  11/10/20  sats < 89% x 7h 33 min so rec 11/30/2020 = >>  needs 2lpm and repeat on 2lpm> refused 02 concentrator  - 12/08/2020 Patient Saturations on Room Air at Rest = 93% and while Ambulating = 85  on 2 Liters of oxygen while Ambulating = 92% - [facility]  07/19/21     36    - [facility]  08/12/21     45   Though somewhat paradoxic, when the lung fails to clear C02 properly and pC02 rises the lung then becomes a more efficient scavenger of C02 allowing lower work of breathing and  better C02 clearance albeit at a higher serum pC02 level - this is why pts can look a lot better than their ABG's would suggest and why it's so difficult to prognosticate endstage dz.  It's also why I strongly rec DNI status (ventilating pts down to a nl pC02 adversely affects this compensatory mechanism)  Reviewed with pt and granddaughter [name]  Advised to titrate 02 to low 90's no higher  Consider Hospice referral rather than repeat admits.          Each maintenance medication was reviewed in detail including emphasizing most importantly the difference between maintenance and prns and under what circumstances the prns are to be triggered using an action plan format where appropriate.  Total time for H and P, chart review, counseling, reviewing hfa/smi/neb/02  device(s) and generating customized AVS unique to this office visit / same day charting = 32 min

## 2021-08-21 NOTE — Assessment & Plan Note (Signed)
Quit smoking 08/2020  - 07/09/2020  After extensive coaching inhaler device,  effectiveness =    75% (short Ti) - 07/09/2020 rec symbicort instead of  breztri due to bladder outlet obst but once foley out successully ok to try breztri - PFT's  08/25/20   FEV1 0.33 (12 % ) ratio 0.30  p 0 % improvement from saba with  FV curve severe concavity  p neb prior to study  - 07/26/2021  After extensive coaching inhaler device,  effectiveness =    75% with smi > try adding spiriva and pred 20 mg until better (ceiling)  10 mg daily (floor)  Appears to be nearing endstage though I'm not confident he's actually taking his meds as intended.   Group D in terms of symptom/risk and laba/lama/ICS  therefore appropriate rx at this point >>>  Symb/spiriva and pred floor of 10 mg as above  Re SABA :  I spent extra time with pt today reviewing appropriate use of albuterol for prn use on exertion with the following points: 1) saba is for relief of sob that does not improve by walking a slower pace or resting but rather if the pt does not improve after trying this first. 2) If the pt is convinced, as many are, that saba helps recover from activity faster then it's easy to tell if this is the case by re-challenging : ie stop, take the inhaler, then p 5 minutes try the exact same activity (intensity of workload) that just caused the symptoms and see if they are substantially diminished or not after saba 3) if there is an activity that reproducibly causes the symptoms, try the saba 15 min before the activity on alternate days   If in fact the saba really does help, then fine to continue to use it prn but advised may need to look closer at the maintenance regimen being used to achieve better control of airways disease with exertion.

## 2021-08-25 ENCOUNTER — Other Ambulatory Visit: Payer: Self-pay | Admitting: *Deleted

## 2021-08-25 ENCOUNTER — Other Ambulatory Visit: Payer: Self-pay

## 2021-08-25 NOTE — Patient Outreach (Signed)
[facility]) Care Management  08/25/2021  Ranger Midura 1946/03/28 [id number]   [facility] outreach to complex care patient    Mr Daniel Barker was referred to [facility] on 05/23/21 by [facility] hospital liaison  Referral Reason: post hospital/complex care  Insurance: united healthcare medicare     [facility] Unsuccessful outreach to step daughter Outreach attempt to the home number [phone] Phone answered, dropped call. [facility] RN CM sent a HIPAA ([place] and Accountability Act) compliant text message along with CM's contact info.     [facility] Unsuccessful outreach to patient Outreach attempt to the home number [phone]  Phone answered x 2, dropped call x 2. [facility] RN CM sent a HIPAA ([place] and Accountability Act) compliant text message along with CM's contact info.  Plan: [facility] RN CM scheduled this patient for another call attempt within 4-7 business days Unsuccessful outreach letter sent on 08/25/21 Unsuccessful outreach on 08/25/21   Joelene Millin L. Lavina Hamman, RN, BSN, [employer] Coordinator Office number [phone] Mobile number [phone]  Main [facility] number [phone] Fax number [phone]

## 2021-08-25 NOTE — Patient Outreach (Signed)
[facility]) Care Management  08/25/2021  Daniel Barker 10/01/1946 [id number]   [facility] return call from complex care patient   Mr Daniel Barker was referred to [facility] on 05/23/21 by [facility] hospital liaison   Referral Reason: post hospital/complex care  Insurance: united healthcare medicare  Mr Daniel Barker returned a call to RN CM and was able to verify HIPAA identifiers Barrier remains hard of hearing but has Daniel Barker hearing aids He reports he may need to change the battery and has batteries in the home  He reports accidentally using them in the shower and possible damage, encouraged to go to local Daniel Barker for service prn  Assessment  Mr Daniel Barker is able to confirm he has received the [place] (DSS) medicaid forms but voices feeling overwhelm to complete  He states "It is a book" and he voices not feeling comfortable with providing financial information requested. He has given the forms to his step daughter. RN CM encouraged him to encourage step daughter to complete forms for his wife  He voices the decrease availability to work with Daniel Barker on care needs for him and his wife He voiced concern about his insurance needs RN CM offered to work with Daniel Barker and him prn RN CM reviewed [facility]) again and his options for insurance needs  Assessed his thoughts about quality of life, goals and expectations and willingness to improve Identified the issues of primary importance to him are the care of his wife, paying off his wife recent snf bill (finances) and visiting family in [place] (relationships) He reports not being depressed, has an interest in doing thing but feels down about being limited related to his health at this time Encouraged safely traveling to visit family in [place] (using bus, train, plane, driving with others) but his preference is to drive   Encouraged access to local DSS, outreach to united  healthcare, [facility] billing/accounting department Encouraged him to attend pulmonary rehab with the hope of increase quality of life to continue to be caregiver to his wife Encouraged positive talk  After detailed discussion and education, Mr Daniel Barker agreed to try pulmonary rehab, wife still cared for by his visiting stepdaughter. No preference for home health, palliative care nor personal care services even after RN CM explained in detail the benefits, counseled, and advocated for each service He reports "I am like my father. If it is going to happen, it is going to happen" He confirms he discussed is prognosis with his pulmonologist and pcp during this month's hospital follow up visits RN CM provide support for his decision/value     RN CM interventions Outreach with patient to united healthcare medicare [phone]. Spoke with Daniel Barker to request assist with Mr Daniel Barker's pulmonary rehabilitation benefit, co pays, in network providers near [phone]. [place] [facility] confirms Daniel Barker's e-mail address is listed also with them.  Outreached with patient to [facility] billing/accounting [phone] but had extensive wait and concluded the call RN CM reviewed the [facility] hardship settlement program details with him and encouraged him to access prn His preference is to continue to pay on bill when available related to history of credit changes   Plans Patient agrees to care plan and follow up within the next 45 business days  Goals Addressed               This Visit's Progress     Patient Stated     COMPLETED: [facility]) Make and Keep  All Appointments (pt-stated)   On track     Timeframe:  Short-Term Goal Priority:  Medium Start Date:          06/02/21                   Expected End Date:     9/8//22                 Complete goal 08/25/21 Barriers:  Hearing Knowledge  - ask family or friend for a ride - call to cancel if needed     Notes:  08/25/21 2 dropped calls, returned a call after text sent,  attending pcp and pulmonary appointments After detailed discussion agreed to try pulmonary rehab, wife still cared for by his visiting step daughter.No preference for home health, palliative care nor personal care services 07/28/21 unsuccessful outreach noted attended 07/26/21 pulmonology appointment 07/19/21 hospitalized in ICU since 07/17/21  07/13/21 attending all appointments, outreaching to MD offices per action plan, pending cardiology appointment, extra family present to assist him with care of his wife 06/30/21 unable to speak at time of [facility] outreach 06/23/21 hospitalized  06/09/21 d/c home on 06/08/21 post hospital follow up 06/09/21 pending making his appointments as he had to cancel the previous ones Decline assist from RN CM  06/05/21 re admission 06/02/21 pending making follow up appointments      [facility]) Track and Manage My Symptoms-COPD (pt-stated)   On track     Timeframe:  Long-Range Goal Priority:  High Start Date:            06/09/21                 Expected End Date:        11/26/21               Follow Up Date 10/25/21 Barriers: Hearing Knowledge    - arrange respite care for caregiver - eliminate symptom triggers at home - follow rescue plan if symptoms flare-up - keep follow-up appointments - use an extra pillow to sleep    Notes:  08/26/21 [facility] multidisciplinary team meeting  08/25/21 2 dropped calls, returned a call after text sent, attending pcp and pulmonary appointments After detailed discussion agreed to try pulmonary rehab, wife still cared for by his visiting step daughter.No preference for home health, palliative care nor personal care services 07/29/21 [facility] multidisciplinary team meeting  07/28/21 unsuccessful outreach noted pulmonology hospital follow up office visit attended on 07/26/21 07/19/21 hospitalized in ICU since 07/17/21  07/13/21 continues with shortness of breath especially at night, outreached to pulmonology per Action plan resulting in medicine changes, to see  cardiology 07/16/21, Has step daughter caring for his wife, taking naps  06/30/21 unable to speak at time of [facility] outreach 06/23/21 hospitalized  06/09/21 doing better, monitoring for increase symptoms, taking medications, plans to return to see pulmonologist      Track and Manage Symptoms-Heart Failure [facility]) (pt-stated)   On track     Timeframe:  Long-Range Goal Priority:  High Start Date:                 07/13/21            Expected End Date:       10/27/21                Follow Up Date 10/25/21  Barriers: Hearing Knowledge    - develop a rescue plan - know when to call the doctor -  track symptoms and what helps feel better or worse     Notes:  08/26/21 [facility] multidisciplinary team meeting  08/25/21 2 dropped calls, returned a call after text sent, attending pcp and pulmonary appointments After detailed discussion agreed to try pulmonary rehab, wife still cared for by his visiting step daughter.No preference for home health, palliative care nor personal care services 07/29/21 [facility] multidisciplinary team meeting  07/28/21 unsuccessful outreach noted pulmonology hospital follow up office visit attended on 07/26/21 07/19/21 hospitalized in ICU since 07/17/21  07/13/21 after review of CHF and home interventions, will attempt to weigh more. Voiced understanding of CHF action plan, when to call MD, states sleeping up helps him to feel better, to see cardiology on 07/16/21        Joelene Millin L. Lavina Hamman, RN, BSN, [place] Coordinator Office number [phone] Main [facility] number [phone] Fax number [phone]

## 2021-08-26 ENCOUNTER — Encounter: Payer: Self-pay | Admitting: *Deleted

## 2021-08-26 ENCOUNTER — Other Ambulatory Visit: Payer: Self-pay | Admitting: *Deleted

## 2021-08-26 NOTE — Patient Outreach (Signed)
[name]) Care Management  08/26/2021  Daniel Barker 10/27/1946 [id number]   Daniel Barker multidisciplinary care discussion (MCD)   Daniel Barker was sent a template report   [facility] RN CM reviewed Epic notes [facility] RN CM reviewed and provided an update on patient status and progression to the [name] RN CM answered questions for MCD Barker  Recommendations - none at this time, continue services as is  Plan [facility] RN CM will follow up with within the next business 30 days  Daniel Barker L. Lavina Hamman, RN, BSN, [place] Coordinator Office number [phone] Mobile number [phone]  Main [facility] number [phone] Fax number [phone]

## 2021-08-31 DIAGNOSIS — J441 Chronic obstructive pulmonary disease with (acute) exacerbation: Secondary | ICD-10-CM | POA: Diagnosis not present

## 2021-08-31 DIAGNOSIS — R001 Bradycardia, unspecified: Secondary | ICD-10-CM | POA: Diagnosis not present

## 2021-08-31 DIAGNOSIS — N1831 Chronic kidney disease, stage 3a: Secondary | ICD-10-CM | POA: Diagnosis not present

## 2021-08-31 DIAGNOSIS — I482 Chronic atrial fibrillation, unspecified: Secondary | ICD-10-CM | POA: Diagnosis not present

## 2021-08-31 DIAGNOSIS — U099 Post covid-19 condition, unspecified: Secondary | ICD-10-CM | POA: Diagnosis not present

## 2021-08-31 DIAGNOSIS — J9621 Acute and chronic respiratory failure with hypoxia: Secondary | ICD-10-CM | POA: Diagnosis not present

## 2021-08-31 DIAGNOSIS — I5032 Chronic diastolic (congestive) heart failure: Secondary | ICD-10-CM | POA: Diagnosis not present

## 2021-08-31 DIAGNOSIS — J449 Chronic obstructive pulmonary disease, unspecified: Secondary | ICD-10-CM | POA: Diagnosis not present

## 2021-08-31 DIAGNOSIS — R739 Hyperglycemia, unspecified: Secondary | ICD-10-CM | POA: Diagnosis not present

## 2021-08-31 DIAGNOSIS — J9601 Acute respiratory failure with hypoxia: Secondary | ICD-10-CM | POA: Diagnosis not present

## 2021-08-31 DIAGNOSIS — E785 Hyperlipidemia, unspecified: Secondary | ICD-10-CM | POA: Diagnosis not present

## 2021-08-31 DIAGNOSIS — I10 Essential (primary) hypertension: Secondary | ICD-10-CM | POA: Diagnosis not present

## 2021-09-03 DIAGNOSIS — I10 Essential (primary) hypertension: Secondary | ICD-10-CM | POA: Diagnosis not present

## 2021-09-03 DIAGNOSIS — J9621 Acute and chronic respiratory failure with hypoxia: Secondary | ICD-10-CM | POA: Diagnosis not present

## 2021-09-03 DIAGNOSIS — I482 Chronic atrial fibrillation, unspecified: Secondary | ICD-10-CM | POA: Diagnosis not present

## 2021-09-03 DIAGNOSIS — N1831 Chronic kidney disease, stage 3a: Secondary | ICD-10-CM | POA: Diagnosis not present

## 2021-09-03 DIAGNOSIS — U099 Post covid-19 condition, unspecified: Secondary | ICD-10-CM | POA: Diagnosis not present

## 2021-09-03 DIAGNOSIS — J449 Chronic obstructive pulmonary disease, unspecified: Secondary | ICD-10-CM | POA: Diagnosis not present

## 2021-09-03 DIAGNOSIS — E785 Hyperlipidemia, unspecified: Secondary | ICD-10-CM | POA: Diagnosis not present

## 2021-09-03 DIAGNOSIS — I5032 Chronic diastolic (congestive) heart failure: Secondary | ICD-10-CM | POA: Diagnosis not present

## 2021-09-03 DIAGNOSIS — R739 Hyperglycemia, unspecified: Secondary | ICD-10-CM | POA: Diagnosis not present

## 2021-09-03 DIAGNOSIS — R001 Bradycardia, unspecified: Secondary | ICD-10-CM | POA: Diagnosis not present

## 2021-09-05 ENCOUNTER — Other Ambulatory Visit: Payer: Self-pay | Admitting: Internal Medicine

## 2021-09-06 DIAGNOSIS — N1831 Chronic kidney disease, stage 3a: Secondary | ICD-10-CM | POA: Diagnosis not present

## 2021-09-06 DIAGNOSIS — J9621 Acute and chronic respiratory failure with hypoxia: Secondary | ICD-10-CM | POA: Diagnosis not present

## 2021-09-06 DIAGNOSIS — E785 Hyperlipidemia, unspecified: Secondary | ICD-10-CM | POA: Diagnosis not present

## 2021-09-06 DIAGNOSIS — U099 Post covid-19 condition, unspecified: Secondary | ICD-10-CM | POA: Diagnosis not present

## 2021-09-06 DIAGNOSIS — J449 Chronic obstructive pulmonary disease, unspecified: Secondary | ICD-10-CM | POA: Diagnosis not present

## 2021-09-06 DIAGNOSIS — I10 Essential (primary) hypertension: Secondary | ICD-10-CM | POA: Diagnosis not present

## 2021-09-06 DIAGNOSIS — R001 Bradycardia, unspecified: Secondary | ICD-10-CM | POA: Diagnosis not present

## 2021-09-06 DIAGNOSIS — Z515 Encounter for palliative care: Secondary | ICD-10-CM | POA: Diagnosis not present

## 2021-09-06 DIAGNOSIS — R739 Hyperglycemia, unspecified: Secondary | ICD-10-CM | POA: Diagnosis not present

## 2021-09-06 DIAGNOSIS — I5032 Chronic diastolic (congestive) heart failure: Secondary | ICD-10-CM | POA: Diagnosis not present

## 2021-09-06 DIAGNOSIS — I482 Chronic atrial fibrillation, unspecified: Secondary | ICD-10-CM | POA: Diagnosis not present

## 2021-09-08 DIAGNOSIS — E785 Hyperlipidemia, unspecified: Secondary | ICD-10-CM | POA: Diagnosis not present

## 2021-09-08 DIAGNOSIS — I482 Chronic atrial fibrillation, unspecified: Secondary | ICD-10-CM | POA: Diagnosis not present

## 2021-09-08 DIAGNOSIS — N1831 Chronic kidney disease, stage 3a: Secondary | ICD-10-CM | POA: Diagnosis not present

## 2021-09-08 DIAGNOSIS — J9621 Acute and chronic respiratory failure with hypoxia: Secondary | ICD-10-CM | POA: Diagnosis not present

## 2021-09-08 DIAGNOSIS — R739 Hyperglycemia, unspecified: Secondary | ICD-10-CM | POA: Diagnosis not present

## 2021-09-08 DIAGNOSIS — J449 Chronic obstructive pulmonary disease, unspecified: Secondary | ICD-10-CM | POA: Diagnosis not present

## 2021-09-08 DIAGNOSIS — I5032 Chronic diastolic (congestive) heart failure: Secondary | ICD-10-CM | POA: Diagnosis not present

## 2021-09-08 DIAGNOSIS — U099 Post covid-19 condition, unspecified: Secondary | ICD-10-CM | POA: Diagnosis not present

## 2021-09-08 DIAGNOSIS — I10 Essential (primary) hypertension: Secondary | ICD-10-CM | POA: Diagnosis not present

## 2021-09-08 DIAGNOSIS — R001 Bradycardia, unspecified: Secondary | ICD-10-CM | POA: Diagnosis not present

## 2021-09-09 DIAGNOSIS — J9621 Acute and chronic respiratory failure with hypoxia: Secondary | ICD-10-CM | POA: Diagnosis not present

## 2021-09-09 DIAGNOSIS — R739 Hyperglycemia, unspecified: Secondary | ICD-10-CM | POA: Diagnosis not present

## 2021-09-09 DIAGNOSIS — I5032 Chronic diastolic (congestive) heart failure: Secondary | ICD-10-CM | POA: Diagnosis not present

## 2021-09-09 DIAGNOSIS — I10 Essential (primary) hypertension: Secondary | ICD-10-CM | POA: Diagnosis not present

## 2021-09-09 DIAGNOSIS — R001 Bradycardia, unspecified: Secondary | ICD-10-CM | POA: Diagnosis not present

## 2021-09-09 DIAGNOSIS — J449 Chronic obstructive pulmonary disease, unspecified: Secondary | ICD-10-CM | POA: Diagnosis not present

## 2021-09-09 DIAGNOSIS — U099 Post covid-19 condition, unspecified: Secondary | ICD-10-CM | POA: Diagnosis not present

## 2021-09-09 DIAGNOSIS — E785 Hyperlipidemia, unspecified: Secondary | ICD-10-CM | POA: Diagnosis not present

## 2021-09-09 DIAGNOSIS — I482 Chronic atrial fibrillation, unspecified: Secondary | ICD-10-CM | POA: Diagnosis not present

## 2021-09-09 DIAGNOSIS — N1831 Chronic kidney disease, stage 3a: Secondary | ICD-10-CM | POA: Diagnosis not present

## 2021-09-13 ENCOUNTER — Telehealth: Payer: Self-pay | Admitting: Internal Medicine

## 2021-09-13 ENCOUNTER — Other Ambulatory Visit: Payer: Self-pay | Admitting: Internal Medicine

## 2021-09-13 DIAGNOSIS — I5032 Chronic diastolic (congestive) heart failure: Secondary | ICD-10-CM | POA: Diagnosis not present

## 2021-09-13 DIAGNOSIS — I10 Essential (primary) hypertension: Secondary | ICD-10-CM | POA: Diagnosis not present

## 2021-09-13 DIAGNOSIS — E785 Hyperlipidemia, unspecified: Secondary | ICD-10-CM | POA: Diagnosis not present

## 2021-09-13 DIAGNOSIS — N1831 Chronic kidney disease, stage 3a: Secondary | ICD-10-CM | POA: Diagnosis not present

## 2021-09-13 DIAGNOSIS — I482 Chronic atrial fibrillation, unspecified: Secondary | ICD-10-CM | POA: Diagnosis not present

## 2021-09-13 DIAGNOSIS — R739 Hyperglycemia, unspecified: Secondary | ICD-10-CM | POA: Diagnosis not present

## 2021-09-13 DIAGNOSIS — J449 Chronic obstructive pulmonary disease, unspecified: Secondary | ICD-10-CM | POA: Diagnosis not present

## 2021-09-13 DIAGNOSIS — J9621 Acute and chronic respiratory failure with hypoxia: Secondary | ICD-10-CM | POA: Diagnosis not present

## 2021-09-13 DIAGNOSIS — R001 Bradycardia, unspecified: Secondary | ICD-10-CM | POA: Diagnosis not present

## 2021-09-13 DIAGNOSIS — U099 Post covid-19 condition, unspecified: Secondary | ICD-10-CM | POA: Diagnosis not present

## 2021-09-13 NOTE — Telephone Encounter (Signed)
Primary Pulmonologist: Dr. [name]  Last office visit and with whom: 08/20/21 Dr. [name] What do we see them for (pulmonary problems): COPD/ resp failure  Last OV assessment/plan: See below   Was appointment offered to patient (explain)?  No    Reason for call:  Rehab nurse came in for scheduled visit and called for patient. Per nurse:  Pt has inc. shortness of breath started last night, edema in lower extremities. Rales and rhonchi upper/lower lobes.  Pt coughing up yellow/green phlegm and states it is thicker than usual and harder to get up. Has been taking regularly scheduled medications and mucinex.   Dr. [name] please advise   Allergies  Allergen Reactions   Lipitor [Atorvastatin]     Unknown reaction    Immunization History  Administered Date(s) Administered   Influenza, High Dose Seasonal PF 08/21/2017   Influenza-Unspecified 08/21/2017   Tdap 12/04/2020   Zoster Recombinat (Shingrix) 11/06/2017     Assessment & Plan Note by Tanda Rockers, MD at 08/21/2021 5:52 AM  Author: Tanda Rockers, MD Author Type: Physician Filed: 08/21/2021  5:55 AM  Note Status: Bernell List: Cosign Not Required Encounter Date: 08/20/2021  Problem: Chronic respiratory failure with hypoxia and hypercapnia ([employer])  Editor: Tanda Rockers, MD (Physician)      Prior Versions: 1. Tanda Rockers, MD (Physician) at 08/21/2021  5:55 AM - Alison Stalling   2. Tanda Rockers, MD (Physician) at 08/21/2021  5:54 AM - Edited   3. Tanda Rockers, MD (Physician) at 08/21/2021  5:53 AM - Written  [facility]03   06/08/19  = 37 - ono RA  11/10/20  sats < 89% x 7h 33 min so rec 11/30/2020 = >>  needs 2lpm and repeat on 2lpm> refused 02 concentrator  - 12/08/2020 Patient Saturations on Room Air at Rest = 93% and while Ambulating = 85  on 2 Liters of oxygen while Ambulating = 92% - [facility]  07/19/21     36    - [facility]  08/12/21     45    Though somewhat paradoxic, when the lung fails to clear C02 properly and pC02 rises the lung then becomes a more  efficient scavenger of C02 allowing lower work of breathing and  better C02 clearance albeit at a higher serum pC02 level - this is why pts can look a lot better than their ABG's would suggest and why it's so difficult to prognosticate endstage dz.  It's also why I strongly rec DNI status (ventilating pts down to a nl pC02 adversely affects this compensatory mechanism)  Reviewed with pt and granddaughter [name]   Advised to titrate 02 to low 90's no higher   Consider Hospice referral rather than repeat admits.            Each maintenance medication was reviewed in detail including emphasizing most importantly the difference between maintenance and prns and under what circumstances the prns are to be triggered using an action plan format where appropriate.   Total time for H and P, chart review, counseling, reviewing hfa/smi/neb/02  device(s) and generating customized AVS unique to this office visit / same day charting = 32 min              Assessment & Plan Note by Tanda Rockers, MD at 08/21/2021 5:50 AM  Author: Tanda Rockers, MD Author Type: Physician Filed: 08/21/2021  5:51 AM  Note Status: Written Cosign: Cosign Not Required Encounter Date: 08/20/2021  Problem: COPD GOLD  IV   Editor: Tanda Rockers, MD (Physician)             Quit smoking 08/2020  - 07/09/2020  After extensive coaching inhaler device,  effectiveness =    75% (short Ti) - 07/09/2020 rec symbicort instead of  breztri due to bladder outlet obst but once foley out successully ok to try breztri - PFT's  08/25/20   FEV1 0.33 (12 % ) ratio 0.30  p 0 % improvement from saba with  FV curve severe concavity  p neb prior to study  - 07/26/2021  After extensive coaching inhaler device,  effectiveness =    75% with smi > try adding spiriva and pred 20 mg until better (ceiling)  10 mg daily (floor)   Appears to be nearing endstage though I'm not confident he's actually taking his meds as intended.    Group D in terms of  symptom/risk and laba/lama/ICS  therefore appropriate rx at this point >>>  Symb/spiriva and pred floor of 10 mg as above   Re SABA :  I spent extra time with pt today reviewing appropriate use of albuterol for prn use on exertion with the following points: 1) saba is for relief of sob that does not improve by walking a slower pace or resting but rather if the pt does not improve after trying this first. 2) If the pt is convinced, as many are, that saba helps recover from activity faster then it's easy to tell if this is the case by re-challenging : ie stop, take the inhaler, then p 5 minutes try the exact same activity (intensity of workload) that just caused the symptoms and see if they are substantially diminished or not after saba 3) if there is an activity that reproducibly causes the symptoms, try the saba 15 min before the activity on alternate days    If in fact the saba really does help, then fine to continue to use it prn but advised may need to look closer at the maintenance regimen being used to achieve better control of airways disease with exertion.

## 2021-09-13 NOTE — Telephone Encounter (Signed)
Spoke to patient and informed him of Dr. Morrison Old advice to go to the emergency room. Patient stated that he understood but as of right now he will not go to the emergency room. Patient stated that he is tired of the hospital. Informed patient that it was Dr.Werts medical advice to go because of his condition. Patient voiced understanding and is still refusing to go to the ER.   Dr. Jones Bales.

## 2021-09-16 ENCOUNTER — Emergency Department (HOSPITAL_COMMUNITY): Payer: Medicare Other

## 2021-09-16 ENCOUNTER — Inpatient Hospital Stay (HOSPITAL_COMMUNITY)
Admission: EM | Admit: 2021-09-16 | Discharge: 2021-09-20 | DRG: 291 | Disposition: A | Discharge status: Home Health | Payer: Medicare Other | Attending: Internal Medicine | Admitting: Internal Medicine

## 2021-09-16 ENCOUNTER — Encounter (HOSPITAL_COMMUNITY): Payer: Self-pay | Admitting: Emergency Medicine

## 2021-09-16 ENCOUNTER — Other Ambulatory Visit: Payer: Self-pay

## 2021-09-16 DIAGNOSIS — J449 Chronic obstructive pulmonary disease, unspecified: Secondary | ICD-10-CM | POA: Diagnosis not present

## 2021-09-16 DIAGNOSIS — N179 Acute kidney failure, unspecified: Secondary | ICD-10-CM

## 2021-09-16 DIAGNOSIS — R0603 Acute respiratory distress: Secondary | ICD-10-CM | POA: Diagnosis not present

## 2021-09-16 DIAGNOSIS — Z955 Presence of coronary angioplasty implant and graft: Secondary | ICD-10-CM

## 2021-09-16 DIAGNOSIS — N1832 Chronic kidney disease, stage 3b: Secondary | ICD-10-CM | POA: Diagnosis not present

## 2021-09-16 DIAGNOSIS — I480 Paroxysmal atrial fibrillation: Secondary | ICD-10-CM | POA: Diagnosis present

## 2021-09-16 DIAGNOSIS — E876 Hypokalemia: Secondary | ICD-10-CM

## 2021-09-16 DIAGNOSIS — R059 Cough, unspecified: Secondary | ICD-10-CM | POA: Diagnosis not present

## 2021-09-16 DIAGNOSIS — Z832 Family history of diseases of the blood and blood-forming organs and certain disorders involving the immune mechanism: Secondary | ICD-10-CM | POA: Diagnosis not present

## 2021-09-16 DIAGNOSIS — R6889 Other general symptoms and signs: Secondary | ICD-10-CM | POA: Diagnosis not present

## 2021-09-16 DIAGNOSIS — T380X5A Adverse effect of glucocorticoids and synthetic analogues, initial encounter: Secondary | ICD-10-CM

## 2021-09-16 DIAGNOSIS — Z7951 Long term (current) use of inhaled steroids: Secondary | ICD-10-CM

## 2021-09-16 DIAGNOSIS — Z951 Presence of aortocoronary bypass graft: Secondary | ICD-10-CM

## 2021-09-16 DIAGNOSIS — N189 Chronic kidney disease, unspecified: Secondary | ICD-10-CM

## 2021-09-16 DIAGNOSIS — I10 Essential (primary) hypertension: Secondary | ICD-10-CM | POA: Diagnosis not present

## 2021-09-16 DIAGNOSIS — Z9981 Dependence on supplemental oxygen: Secondary | ICD-10-CM

## 2021-09-16 DIAGNOSIS — N400 Enlarged prostate without lower urinary tract symptoms: Secondary | ICD-10-CM | POA: Diagnosis present

## 2021-09-16 DIAGNOSIS — Z87898 Personal history of other specified conditions: Secondary | ICD-10-CM

## 2021-09-16 DIAGNOSIS — Z8616 Personal history of COVID-19: Secondary | ICD-10-CM

## 2021-09-16 DIAGNOSIS — I2510 Atherosclerotic heart disease of native coronary artery without angina pectoris: Secondary | ICD-10-CM | POA: Diagnosis not present

## 2021-09-16 DIAGNOSIS — Z20822 Contact with and (suspected) exposure to covid-19: Secondary | ICD-10-CM | POA: Diagnosis not present

## 2021-09-16 DIAGNOSIS — N1831 Chronic kidney disease, stage 3a: Secondary | ICD-10-CM | POA: Diagnosis not present

## 2021-09-16 DIAGNOSIS — U099 Post covid-19 condition, unspecified: Secondary | ICD-10-CM | POA: Diagnosis not present

## 2021-09-16 DIAGNOSIS — Z7952 Long term (current) use of systemic steroids: Secondary | ICD-10-CM

## 2021-09-16 DIAGNOSIS — Z8249 Family history of ischemic heart disease and other diseases of the circulatory system: Secondary | ICD-10-CM | POA: Diagnosis not present

## 2021-09-16 DIAGNOSIS — Z888 Allergy status to other drugs, medicaments and biological substances status: Secondary | ICD-10-CM

## 2021-09-16 DIAGNOSIS — E785 Hyperlipidemia, unspecified: Secondary | ICD-10-CM | POA: Diagnosis not present

## 2021-09-16 DIAGNOSIS — H9190 Unspecified hearing loss, unspecified ear: Secondary | ICD-10-CM | POA: Diagnosis not present

## 2021-09-16 DIAGNOSIS — I130 Hypertensive heart and chronic kidney disease with heart failure and stage 1 through stage 4 chronic kidney disease, or unspecified chronic kidney disease: Secondary | ICD-10-CM | POA: Diagnosis not present

## 2021-09-16 DIAGNOSIS — T502X5A Adverse effect of carbonic-anhydrase inhibitors, benzothiadiazides and other diuretics, initial encounter: Secondary | ICD-10-CM | POA: Diagnosis not present

## 2021-09-16 DIAGNOSIS — W19XXXA Unspecified fall, initial encounter: Secondary | ICD-10-CM | POA: Diagnosis not present

## 2021-09-16 DIAGNOSIS — I482 Chronic atrial fibrillation, unspecified: Secondary | ICD-10-CM

## 2021-09-16 DIAGNOSIS — E1122 Type 2 diabetes mellitus with diabetic chronic kidney disease: Secondary | ICD-10-CM | POA: Diagnosis present

## 2021-09-16 DIAGNOSIS — Z7902 Long term (current) use of antithrombotics/antiplatelets: Secondary | ICD-10-CM

## 2021-09-16 DIAGNOSIS — E1165 Type 2 diabetes mellitus with hyperglycemia: Secondary | ICD-10-CM | POA: Diagnosis not present

## 2021-09-16 DIAGNOSIS — I5033 Acute on chronic diastolic (congestive) heart failure: Secondary | ICD-10-CM | POA: Diagnosis not present

## 2021-09-16 DIAGNOSIS — Z87891 Personal history of nicotine dependence: Secondary | ICD-10-CM | POA: Diagnosis not present

## 2021-09-16 DIAGNOSIS — E119 Type 2 diabetes mellitus without complications: Secondary | ICD-10-CM

## 2021-09-16 DIAGNOSIS — E7800 Pure hypercholesterolemia, unspecified: Secondary | ICD-10-CM | POA: Diagnosis not present

## 2021-09-16 DIAGNOSIS — R0602 Shortness of breath: Secondary | ICD-10-CM | POA: Diagnosis not present

## 2021-09-16 DIAGNOSIS — D649 Anemia, unspecified: Secondary | ICD-10-CM

## 2021-09-16 DIAGNOSIS — J441 Chronic obstructive pulmonary disease with (acute) exacerbation: Secondary | ICD-10-CM | POA: Diagnosis not present

## 2021-09-16 DIAGNOSIS — Z743 Need for continuous supervision: Secondary | ICD-10-CM | POA: Diagnosis not present

## 2021-09-16 DIAGNOSIS — D539 Nutritional anemia, unspecified: Secondary | ICD-10-CM

## 2021-09-16 DIAGNOSIS — R739 Hyperglycemia, unspecified: Secondary | ICD-10-CM | POA: Diagnosis not present

## 2021-09-16 DIAGNOSIS — R000 Tachycardia, unspecified: Secondary | ICD-10-CM | POA: Diagnosis not present

## 2021-09-16 DIAGNOSIS — Z825 Family history of asthma and other chronic lower respiratory diseases: Secondary | ICD-10-CM | POA: Diagnosis not present

## 2021-09-16 DIAGNOSIS — I5032 Chronic diastolic (congestive) heart failure: Secondary | ICD-10-CM

## 2021-09-16 DIAGNOSIS — Z79899 Other long term (current) drug therapy: Secondary | ICD-10-CM

## 2021-09-16 DIAGNOSIS — J9622 Acute and chronic respiratory failure with hypercapnia: Secondary | ICD-10-CM | POA: Diagnosis not present

## 2021-09-16 DIAGNOSIS — R0689 Other abnormalities of breathing: Secondary | ICD-10-CM | POA: Diagnosis not present

## 2021-09-16 DIAGNOSIS — R609 Edema, unspecified: Secondary | ICD-10-CM

## 2021-09-16 DIAGNOSIS — J9621 Acute and chronic respiratory failure with hypoxia: Secondary | ICD-10-CM | POA: Diagnosis not present

## 2021-09-16 DIAGNOSIS — R001 Bradycardia, unspecified: Secondary | ICD-10-CM | POA: Diagnosis not present

## 2021-09-16 LAB — BASIC METABOLIC PANEL
Anion gap: 14 (ref 5–15)
BUN: 52 mg/dL — ABNORMAL HIGH (ref 8–23)
CO2: 32 mmol/L (ref 22–32)
Calcium: 8.3 mg/dL — ABNORMAL LOW (ref 8.9–10.3)
Chloride: 92 mmol/L — ABNORMAL LOW (ref 98–111)
Creatinine, Ser: 2.1 mg/dL — ABNORMAL HIGH (ref 0.61–1.24)
GFR, Estimated: 32 mL/min — ABNORMAL LOW (ref >60)
Glucose, Bld: 176 mg/dL — ABNORMAL HIGH (ref 70–99)
Potassium: 3.4 mmol/L — ABNORMAL LOW (ref 3.5–5.1)
Sodium: 138 mmol/L (ref 135–145)

## 2021-09-16 LAB — BLOOD GAS, ARTERIAL
Acid-Base Excess: 10 mmol/L — ABNORMAL HIGH (ref 0.0–2.0)
Bicarbonate: 33 mmol/L — ABNORMAL HIGH (ref 20.0–28.0)
FIO2: 28
O2 Saturation: 95.3 %
Patient temperature: 36.6
pCO2 arterial: 50.5 mmHg — ABNORMAL HIGH (ref 32.0–48.0)
pH, Arterial: 7.448 (ref 7.350–7.450)
pO2, Arterial: 76.7 mmHg — ABNORMAL LOW (ref 83.0–108.0)

## 2021-09-16 LAB — CBC WITH DIFFERENTIAL/PLATELET
Abs Immature Granulocytes: 0.13 10*3/uL — ABNORMAL HIGH (ref 0.00–0.07)
Basophils Absolute: 0 10*3/uL (ref 0.0–0.1)
Basophils Relative: 0 %
Eosinophils Absolute: 0 10*3/uL (ref 0.0–0.5)
Eosinophils Relative: 0 %
HCT: 35.8 % — ABNORMAL LOW (ref 39.0–52.0)
Hemoglobin: 11 g/dL — ABNORMAL LOW (ref 13.0–17.0)
Immature Granulocytes: 2 %
Lymphocytes Relative: 6 %
Lymphs Abs: 0.5 10*3/uL — ABNORMAL LOW (ref 0.7–4.0)
MCH: 31.3 pg (ref 26.0–34.0)
MCHC: 30.7 g/dL (ref 30.0–36.0)
MCV: 102 fL — ABNORMAL HIGH (ref 80.0–100.0)
Monocytes Absolute: 0.3 10*3/uL (ref 0.1–1.0)
Monocytes Relative: 4 %
Neutro Abs: 7.5 10*3/uL (ref 1.7–7.7)
Neutrophils Relative %: 88 %
Platelets: 192 10*3/uL (ref 150–400)
RBC: 3.51 MIL/uL — ABNORMAL LOW (ref 4.22–5.81)
RDW: 14.2 % (ref 11.5–15.5)
WBC: 8.4 10*3/uL (ref 4.0–10.5)
nRBC: 0 % (ref 0.0–0.2)

## 2021-09-16 LAB — RESP PANEL BY RT-PCR (FLU A&B, COVID) ARPGX2
Influenza A by PCR: NEGATIVE
Influenza B by PCR: NEGATIVE
SARS Coronavirus 2 by RT PCR: NEGATIVE

## 2021-09-16 MED ORDER — ALBUTEROL SULFATE (2.5 MG/3ML) 0.083% IN NEBU
10.0000 mg | INHALATION_SOLUTION | Freq: Once | RESPIRATORY_TRACT | Status: AC
  Administered 2021-09-16: 10 mg via RESPIRATORY_TRACT
  Filled 2021-09-16: qty 12

## 2021-09-16 MED ORDER — ALBUTEROL (5 MG/ML) CONTINUOUS INHALATION SOLN: 10.0000 mg/h | INHALATION_SOLUTION | RESPIRATORY_TRACT | Status: AC

## 2021-09-16 MED ORDER — MAGNESIUM SULFATE 2 GM/50ML IV SOLN
2.0000 g | Freq: Once | INTRAVENOUS | Status: AC
  Administered 2021-09-16: 2 g via INTRAVENOUS
  Filled 2021-09-16: qty 50

## 2021-09-16 MED ORDER — ALBUTEROL (5 MG/ML) CONTINUOUS INHALATION SOLN: 10.0000 mg/h | INHALATION_SOLUTION | Freq: Once | RESPIRATORY_TRACT | Status: DC

## 2021-09-16 MED ORDER — SODIUM CHLORIDE 0.9 % IV SOLN: INTRAVENOUS | Status: AC

## 2021-09-16 MED ORDER — FUROSEMIDE 10 MG/ML IJ SOLN
40.0000 mg | Freq: Once | INTRAMUSCULAR | Status: AC
  Administered 2021-09-16: 40 mg via INTRAVENOUS
  Filled 2021-09-16: qty 4

## 2021-09-16 MED ORDER — LEVOFLOXACIN 750 MG PO TABS
750.0000 mg | ORAL_TABLET | Freq: Once | ORAL | Status: AC
  Administered 2021-09-16: 750 mg via ORAL
  Filled 2021-09-16: qty 1

## 2021-09-16 MED ORDER — METHYLPREDNISOLONE SODIUM SUCC 125 MG IJ SOLR
125.0000 mg | Freq: Once | INTRAMUSCULAR | Status: AC
  Administered 2021-09-16: 125 mg via INTRAVENOUS
  Filled 2021-09-16: qty 2

## 2021-09-16 MED ORDER — ENOXAPARIN SODIUM 30 MG/0.3ML IJ SOSY
30.0000 mg | PREFILLED_SYRINGE | INTRAMUSCULAR | Status: DC
  Administered 2021-09-17 – 2021-09-19 (×3): 30 mg via SUBCUTANEOUS
  Filled 2021-09-16 (×3): qty 0.3

## 2021-09-16 NOTE — H&P (Signed)
History and Physical  Daniel Barker [id number] DOB: Apr 26, 1946 DOA: 09/16/2021  Referring physician: Noemi Chapel, MD PCP: [name], MD  Patient coming from: Home  Chief Complaint: Shortness of breath  HPI: Daniel Barker is a 75 y.o. 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 at 2 L/min, COPD, chronic diastolic heart failure, type 2 diabetes mellitus, chronic atrial fibrillation on anticoagulation, CKD stage III, hard of hearing who presents to the emergency department via EMS due to several days of onset of increasing shortness of breath associated with productive cough which was initially yellow but which eventually changed to white sputum, he complained of worsening shortness of breath on exertion.  Patient states that he has home health nurse who noted some coarse breath sounds when he was seen on Monday (10/17) and on being seen yesterday, it was noted that his breathing was labored, so EMS was activated, supplemental oxygen was increased to 4 L en route to the ED.  Patient endorsed increased leg swelling bilaterally which he states was chronic.  ED Course:  In the emergency department, he was hemodynamically stable, intermittently tachypneic and other vital signs were within normal range.  Work-up in the ED showed macrocytic anemia, hypokalemia, BUN/Cr at 52/2.10 (baseline creatinine at 1.3-1.7).  ABG showed pH 7.448, PCO2 50.5, PO2 76.7, bicarb 33.0.  Influenza a, B, SARS coronavirus 2 was negative. Chest x-ray showed no active cardiopulmonary disease. Breathing treatment was provided, IV Lasix 40x1 was given, Levaquin was given, IV Solu-Medrol 25 mg x 1 was given.  Hospitalist was asked to admit.  For further evaluation and management.  Review of Systems: Constitutional: Negative for chills and fever.  HENT: Negative for ear pain and sore throat.   Eyes: Negative for pain and visual disturbance.  Respiratory:  Positive for shortness of breath and productive cough.   Cardiovascular: Negative for chest pain and palpitations.  Gastrointestinal: Negative for abdominal pain and vomiting.  Endocrine: Negative for polyphagia and polyuria.  Genitourinary: Negative for decreased urine volume, dysuria, enuresis Musculoskeletal: Negative for arthralgias and back pain.  Skin: Negative for color change and rash.  Allergic/Immunologic: Negative for immunocompromised state.  Neurological: Negative for tremors, syncope, speech difficulty Hematological: Does not bruise/bleed easily.  All other systems reviewed and are negative   Past Medical History:  Diagnosis Date   Acute metabolic encephalopathy [id number]   Asthma    Atrial fibrillation (HCC)    BPH (benign prostatic hyperplasia)    Cigarette smoker    CKD (chronic kidney disease)    COPD (chronic obstructive pulmonary disease) (HCC)    Coronary artery disease    Cough    High cholesterol    Hx of CABG    Hypercholesteremia    Hypertension    Localized edema    Stented coronary artery    Past Surgical History:  Procedure Laterality Date   CARDIAC SURGERY     CORONARY ARTERY BYPASS GRAFT      Social History:  reports that he quit smoking about 12 months ago. His smoking use included cigarettes. He has a 60.00 pack-year smoking history. He has never used smokeless tobacco. He reports that he does not drink alcohol and does not use drugs.   Allergies  Allergen Reactions   Lipitor [Atorvastatin]     Unknown reaction    Family History  Problem Relation Age of Onset   Hypertension Mother    Clotting disorder Mother    COPD Father  Prior to Admission medications   Medication Sig Start Date End Date Taking? Authorizing Provider  acetaminophen (TYLENOL) 500 MG tablet Take 1,000 mg by mouth every 6 (six) hours as needed.    [provider]  albuterol (PROVENTIL) (2.5 MG/3ML) 0.083% nebulizer solution INHALE CONTENTS OF 1 VIAL  IN NEBULIZER EVERY 6 HOURS AS NEEDED FOR WHEEZING/SHORTNESS OF BREATH 09/13/21   Tanda Rockers, MD  albuterol (VENTOLIN HFA) 108 (90 Base) MCG/ACT inhaler INHALE 2 PUFFS BY MOUTH EVERY 4 HOURS AS NEEDED 09/06/21   Tanda Rockers, MD  amLODipine (NORVASC) 5 MG tablet Take 5 mg by mouth daily. 06/04/21   [provider]  bisoprolol (ZEBETA) 5 MG tablet Take 0.5 tablets (2.5 mg total) by mouth daily. HOLD FOR  HEART RATE LESS THAN 65 OR SBP LESS THAN 95 08/14/21   Johnson, Clanford L, MD  budesonide-formoterol [facility]) 160-4.5 MCG/ACT inhaler Take 2 puffs first thing in am and then another 2 puffs about 12 hours later. Patient taking differently: Inhale 2 puffs into the lungs every 12 (twelve) hours. Take 2 puffs first thing in am and then another 2 puffs about 12 hours later. 10/27/20   Tanda Rockers, MD  clopidogrel (PLAVIX) 75 MG tablet Take 75 mg by mouth daily.    [provider]  fenofibrate micronized (LOFIBRA) 134 MG capsule Take 134 mg by mouth daily before breakfast.    [provider]  fluticasone (FLONASE) 50 MCG/ACT nasal spray Place 2 sprays into both nostrils in the morning and at bedtime. 08/20/21   Tanda Rockers, MD  levofloxacin (LEVAQUIN) 500 MG tablet SMARTSIG:1 Tablet(s) By Mouth Every 24 Hours 04/27/21   [provider]  montelukast (SINGULAIR) 10 MG tablet Take 10 mg by mouth at bedtime.    [provider]  montelukast (SINGULAIR) 10 MG tablet montelukast 10 mg tablet  Take 1 tablet every day by oral route at bedtime.    [provider]  pantoprazole (PROTONIX) 40 MG tablet Take 1 tablet (40 mg total) by mouth 2 (two) times daily. 05/23/21   Barton Dubois, MD  predniSONE (DELTASONE) 10 MG tablet resume 1 tab daily 08/13/21   Johnson, Clanford L, MD  predniSONE (DELTASONE) 50 MG tablet prednisone 50 mg tablet    [provider]  predniSONE (STERAPRED UNI-PAK 21 TAB) 10 MG (21) TBPK tablet See admin instructions.  follow package directions 04/27/21   [provider]  tamsulosin (FLOMAX) 0.4 MG CAPS capsule TAKE 1 CAPSULE(0.4 MG) BY MOUTH DAILY Patient taking differently: Take 0.4 mg by mouth daily. 06/30/21   McKenzie, Candee Furbish, MD  Tiotropium Bromide Monohydrate (SPIRIVA RESPIMAT) 2.5 MCG/ACT AERS Spiriva one click each am 0000000   Tanda Rockers, MD  Tiotropium Bromide Monohydrate (SPIRIVA RESPIMAT) 2.5 MCG/ACT AERS Inhale 2 puffs into the lungs daily. 08/20/21   Tanda Rockers, MD  Tiotropium Bromide Monohydrate (SPIRIVA RESPIMAT) 2.5 MCG/ACT AERS Inhale 2 puffs into the lungs daily. 08/20/21   Tanda Rockers, MD  torsemide (DEMADEX) 20 MG tablet Take 1 tablet (20 mg total) by mouth every other day. 08/16/21 09/15/21  Johnson, Clanford L, MD  torsemide (DEMADEX) 20 MG tablet torsemide 20 mg tablet    [provider]  zinc sulfate 220 (50 Zn) MG capsule Take 1 capsule (220 mg total) by mouth daily. 05/24/21   Barton Dubois, MD    Physical Exam: BP (!) 116/55   Pulse 74   Temp 98.1 F (36.7 C) (Oral)  Resp 14   Ht '5\' 6"'$  (1.676 m)   Wt 71.7 kg   SpO2 100%   BMI 25.50 kg/m   General: 75 y.o. year-old male ill-appearing but in no acute distress.  Alert and oriented x3. HEENT: NCAT, EOMI Neck: Supple, trachea medial Cardiovascular: Regular rate and rhythm with no rubs or gallops.  No thyromegaly or JVD noted.  No lower extremity edema. 2/4 pulses in all 4 extremities. Respiratory: Diffuse coarse breath sounds with scattered wheezes and rales.   Abdomen: Soft, nontender nondistended with normal bowel sounds x4 quadrants. Muskuloskeletal: No cyanosis, clubbing or edema noted bilaterally Neuro: CN II-XII intact, strength 5/5 x 4, sensation, reflexes intact Skin: No ulcerative lesions noted or rashes Psychiatry: Judgement and insight appear normal. Mood is appropriate for condition and setting          Labs on Admission:  Basic Metabolic Panel: Recent Labs  Lab 09/16/21 1915   NA 138  K 3.4*  CL 92*  CO2 32  GLUCOSE 176*  BUN 52*  CREATININE 2.10*  CALCIUM 8.3*   Liver Function Tests: No results for input(s): AST, ALT, ALKPHOS, BILITOT, PROT, ALBUMIN in the last 168 hours. No results for input(s): LIPASE, AMYLASE in the last 168 hours. No results for input(s): AMMONIA in the last 168 hours. CBC: Recent Labs  Lab 09/16/21 1915  WBC 8.4  NEUTROABS 7.5  HGB 11.0*  HCT 35.8*  MCV 102.0*  PLT 192   Cardiac Enzymes: No results for input(s): CKTOTAL, CKMB, CKMBINDEX, TROPONINI in the last 168 hours.  BNP (last 3 results) Recent Labs    07/02/21 2230 07/17/21 1226 08/08/21 1510  BNP 302.0* 638.0* 812.0*    ProBNP (last 3 results) No results for input(s): PROBNP in the last 8760 hours.  CBG: No results for input(s): GLUCAP in the last 168 hours.  Radiological Exams on Admission: DG Chest Port 1 View  Result Date: 09/16/2021 CLINICAL DATA:  Cough, shortness of breath EXAM: PORTABLE CHEST 1 VIEW COMPARISON:  08/08/2021 FINDINGS: Prior median sternotomy. Heart and mediastinal contours are within normal limits. No focal opacities or effusions. No acute bony abnormality. Aortic atherosclerosis. IMPRESSION: No active cardiopulmonary disease. Electronically Signed   By: Rolm Baptise M.D.   On: 09/16/2021 20:27    EKG: I independently viewed the EKG done and my findings are as followed: Sinus bradycardia or ectopic atrial rhythm at rate of 56 bpm  Assessment/Plan Present on Admission:  COPD with acute exacerbation ([place])  Acute on chronic respiratory failure with hypoxia and hypercapnia (HCC) - baseline PCO2 80-90s, chronically on 2 L/min at home.  Steroid-induced hyperglycemia  Chronic atrial fibrillation (HCC) - not on systemic anticoagulants due to patient's choice.  Chronic diastolic heart failure (HCC)  Active Problems:   Acute kidney injury superimposed on CKD (HCC)   Type 2 diabetes mellitus ([place])   Acute on chronic respiratory failure  with hypoxia and hypercapnia (HCC) - baseline PCO2 80-90s, chronically on 2 L/min at home.   Chronic atrial fibrillation (HCC) - not on systemic anticoagulants due to patient's choice.   Chronic diastolic heart failure (HCC)   Steroid-induced hyperglycemia   COPD with acute exacerbation (HCC)   Hypokalemia   Macrocytic anemia   History of peripheral edema   Acute diastolic CHF (congestive heart failure) (HCC)   COPD exacerbation Continue duo nebs, Mucinex, Solu-Medrol, azithromycin. Continue Protonix to prevent steroid-induced ulcer Continue incentive spirometry and flutter valve Continue supplemental oxygen to maintain O2 sat > 92% with plan to  wean patient off oxygen as tolerated  Acute on chronic respiratory failure with hypoxia and hypercapnia on 2 L/min of oxygen at home Continue supplemental oxygen to maintain O2 sats > 92%  Steroid-induced hyperglycemia Continue SSI and hypoglycemia protocol  Acute kidney injury superimposed on CKD stage IIIb BUN/Cr at 52/2.10 (baseline creatinine at 1.3-1.7 Renally adjust medications, avoid nephrotoxic agents/dehydration/hypotension  Hypokalemia K+ is 3.4 K+ will be replenished Please monitor for AM K+ for further replenishmemnt  Macrocytic anemia Vitamin B12 and folate levels will be checked  Chronic diastolic heart failure  History of peripheral edema Not clear if pt's respiratory distress was due to CHF exacerbation vs COPD exacerbation or both.  Echocardiogram done on 06/06/2021 showed LVEF of 60 to 65% and showed LV diastolic parameters consistent with grade 1 diastolic dysfunction Continue with IV Lasix 40 twice daily (as tolerated by BP) Patient was counseled on low-sodium diet  Diabetes mellitus  A1c about a month ago was 5.9, no diabetic medication noted on patient's med rec Continue SSI as indicated for steroid induced hyperglycemia   Chronic atrial fibrillation  Patient was not on any rate control or anticoagulant per  med rec EKG personally reviewed showed sinus bradycardia or ectopic atrial rhythm at rate of 56 bpm    DVT prophylaxis: Lovenox  Code Status: Full code  Family Communication: None at bedside  Disposition Plan:  Patient is from:                        home Anticipated DC to:                   SNF or family members home Anticipated DC date:               2-3 days Anticipated DC barriers:          Patient requires inpatient management due to copd and worsening hypoxia requiring increased oxygen supplementation  Consults called: None  Admission status: Observation    Bernadette Hoit MD [facility]  09/17/2021, 1:55 AM

## 2021-09-16 NOTE — ED Triage Notes (Signed)
Pts home health nurse called EMS to bring pt in for being SOB x1week. Pt wears 2L of O2 @ home. Pt has a productive cough. EMS has pt on 4L.

## 2021-09-16 NOTE — ED Provider Notes (Signed)
[facility] EMERGENCY DEPARTMENT Provider Note   CSN: [id number] Arrival date & time: 09/16/21  1828     History Chief Complaint  Patient presents with   Shortness of Breath    Daniel Barker is a 75 y.o. male.   Shortness of Breath  This patient unfortunately has a history of chronic cigarette smoking, chronic COPD and in fact the patient is on 2 L by nasal cannula chronically.  He lives at home, he presents to the hospital today with recurrent shortness of breath after being told by his nurse earlier in the week that he needed to come in.  He refused at the time but because of increasing coughing sputum yellow sputum and severe dyspnea on exertion he was encouraged to come back in and agreed.  He arrives by ambulance transport on increasing oxygen requirement of 4 L compared to his baseline 2.  He denies fevers or chills but does endorse some progressive swelling in his legs (of note the patient is on amlodipine) he does take torsemide 20 mg twice daily.  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 05/19/2021   COVID-19 virus infection 05/18/2021   Essential tremor 05/04/2021   Chronic atrial fibrillation ([place]) - not on systemic anticoagulants due to patient's choice. 04/25/2021   Chronic kidney disease 04/25/2021   Asthma 04/25/2021   H/O coronary angioplasty 04/25/2021   Impaired fasting glucose 04/25/2021   Cellulitis 04/25/2021   Edema 04/25/2021   Nicotine dependence 04/25/2021   Overweight 04/25/2021   Prediabetes 04/25/2021   Tremor 04/25/2021   Vitamin D deficiency 04/25/2021   Mixed hyperlipidemia 04/16/2021   Closed displaced spiral fracture of shaft of left humerus 12/23/2020   Chronic respiratory failure with hypoxia and hypercapnia ([place]) 09/02/2020   Urinary retention 07/08/2020   Benign prostatic hyperplasia with lower urinary tract symptoms 07/08/2020   COPD GOLD IV  05/28/2020   Cigarette smoker 05/28/2020   Anemia of chronic disease 06/03/2014   Atherosclerosis of coronary artery without angina pectoris 06/03/2014   Acute respiratory failure with hypercapnia ([place]) 06/01/2014   Diabetes mellitus ([place]) 06/01/2014   Low blood pressure 06/01/2014    Past Surgical History:  Procedure Laterality Date   CARDIAC SURGERY     CORONARY ARTERY BYPASS GRAFT         Family  History  Problem Relation Age of Onset   Hypertension Mother    Clotting disorder Mother    COPD Father     Social History   Tobacco Use   Smoking status: Former    Packs/day: 1.00    Years: 60.00    Pack years: 60.00    Types: Cigarettes    Quit date: 09/01/2020    Years since quitting: 1.0   Smokeless tobacco: Never  Vaping Use   Vaping Use: Never used  Substance Use Topics   Alcohol use: Never   Drug use: Never    Home Medications Prior to  Admission medications   Medication Sig Start Date End Date Taking? Authorizing Provider  acetaminophen (TYLENOL) 500 MG tablet Take 1,000 mg by mouth every 6 (six) hours as needed.    [provider]  albuterol (PROVENTIL) (2.5 MG/3ML) 0.083% nebulizer solution INHALE CONTENTS OF 1 VIAL IN NEBULIZER EVERY 6 HOURS AS NEEDED FOR WHEEZING/SHORTNESS OF BREATH 09/13/21   Tanda Rockers, MD  albuterol (VENTOLIN HFA) 108 (90 Base) MCG/ACT inhaler INHALE 2 PUFFS BY MOUTH EVERY 4 HOURS AS NEEDED 09/06/21   Tanda Rockers, MD  amLODipine (NORVASC) 5 MG tablet Take 5 mg by mouth daily. 06/04/21   [provider]  bisoprolol (ZEBETA) 5 MG tablet Take 0.5 tablets (2.5 mg total) by mouth daily. HOLD FOR  HEART RATE LESS THAN 65 OR SBP LESS THAN 95 08/14/21   Johnson, Clanford L, MD  budesonide-formoterol [facility]) 160-4.5 MCG/ACT inhaler Take 2 puffs first thing in am and then another 2 puffs about 12 hours later. Patient taking differently: Inhale 2 puffs into the lungs every 12 (twelve) hours. Take 2 puffs first thing in am and then another 2 puffs about 12 hours later. 10/27/20   Tanda Rockers, MD  clopidogrel (PLAVIX) 75 MG tablet Take 75 mg by mouth daily.    [provider]  fenofibrate micronized (LOFIBRA) 134 MG capsule Take 134 mg by mouth daily before breakfast.    [provider]  fluticasone (FLONASE) 50 MCG/ACT nasal spray Place 2 sprays into both nostrils in the morning and at bedtime. 08/20/21   Tanda Rockers, MD  levofloxacin (LEVAQUIN) 500 MG tablet SMARTSIG:1 Tablet(s) By Mouth Every 24 Hours 04/27/21   [provider]  montelukast (SINGULAIR) 10 MG tablet Take 10 mg by mouth at bedtime.    [provider]  montelukast (SINGULAIR) 10 MG tablet montelukast 10 mg tablet  Take 1 tablet every day by oral route at bedtime.    [provider]  pantoprazole (PROTONIX) 40 MG tablet Take 1 tablet (40 mg total) by mouth 2 (two) times daily.  05/23/21   Barton Dubois, MD  predniSONE (DELTASONE) 10 MG tablet resume 1 tab daily 08/13/21   Johnson, Clanford L, MD  predniSONE (DELTASONE) 50 MG tablet prednisone 50 mg tablet    [provider]  predniSONE (STERAPRED UNI-PAK 21 TAB) 10 MG (21) TBPK tablet See admin instructions. follow package directions 04/27/21   [provider]  tamsulosin (FLOMAX) 0.4 MG CAPS capsule TAKE 1 CAPSULE(0.4 MG) BY MOUTH DAILY Patient taking differently: Take 0.4 mg by mouth daily. 06/30/21   McKenzie, Candee Furbish, MD  Tiotropium Bromide Monohydrate (SPIRIVA RESPIMAT) 2.5 MCG/ACT AERS Spiriva one click each am 0000000   Tanda Rockers, MD  Tiotropium Bromide Monohydrate (SPIRIVA RESPIMAT) 2.5 MCG/ACT AERS Inhale 2 puffs into the lungs daily. 08/20/21   Tanda Rockers, MD  Tiotropium Bromide Monohydrate (  SPIRIVA RESPIMAT) 2.5 MCG/ACT AERS Inhale 2 puffs into the lungs daily. 08/20/21   Tanda Rockers, MD  torsemide (DEMADEX) 20 MG tablet Take 1 tablet (20 mg total) by mouth every other day. 08/16/21 09/15/21  Johnson, Clanford L, MD  torsemide (DEMADEX) 20 MG tablet torsemide 20 mg tablet    [provider]  zinc sulfate 220 (50 Zn) MG capsule Take 1 capsule (220 mg total) by mouth daily. 05/24/21   Barton Dubois, MD    Allergies    Lipitor [atorvastatin]  Review of Systems   Review of Systems  Respiratory:  Positive for shortness of breath.   All other systems reviewed and are negative.  Physical Exam Updated Vital Signs BP (!) 116/52   Pulse 61   Temp 98.1 F (36.7 C) (Oral)   Resp 16   Ht 1.676 m ('5\' 6"'$ )   Wt 71.7 kg   SpO2 100%   BMI 25.50 kg/m   Physical Exam Vitals and nursing note reviewed.  Constitutional:      General: He is in acute distress.     Appearance: He is well-developed. He is ill-appearing.  HENT:     Head: Normocephalic and atraumatic.     Mouth/Throat:     Pharynx: No oropharyngeal exudate.  Eyes:     General: No scleral icterus.       Right  eye: No discharge.        Left eye: No discharge.     Conjunctiva/sclera: Conjunctivae normal.     Pupils: Pupils are equal, round, and reactive to light.  Neck:     Thyroid: No thyromegaly.     Vascular: No JVD.  Cardiovascular:     Rate and Rhythm: Regular rhythm. Tachycardia present.     Heart sounds: Normal heart sounds. No murmur heard.   No friction rub. No gallop.  Pulmonary:     Effort: Respiratory distress present.     Breath sounds: Wheezing, rhonchi and rales present.  Abdominal:     General: Bowel sounds are normal. There is no distension.     Palpations: Abdomen is soft. There is no mass.     Tenderness: There is no abdominal tenderness.  Musculoskeletal:        General: No tenderness. Normal range of motion.     Cervical back: Normal range of motion and neck supple.     Right lower leg: Edema present.     Left lower leg: Edema present.  Lymphadenopathy:     Cervical: No cervical adenopathy.  Skin:    General: Skin is warm and dry.     Findings: No erythema or rash.  Neurological:     Mental Status: He is alert.     Coordination: Coordination normal.  Psychiatric:        Behavior: Behavior normal.    ED Results / Procedures / Treatments   Labs (all labs ordered are listed, but only abnormal results are displayed) Labs Reviewed  BASIC METABOLIC PANEL - Abnormal; Notable for the following components:      Result Value   Potassium 3.4 (*)    Chloride 92 (*)    Glucose, Bld 176 (*)    BUN 52 (*)    Creatinine, Ser 2.10 (*)    Calcium 8.3 (*)    GFR, Estimated 32 (*)    All other components within normal limits  CBC WITH DIFFERENTIAL/PLATELET - Abnormal; Notable for the following components:   RBC 3.51 (*)    Hemoglobin  11.0 (*)    HCT 35.8 (*)    MCV 102.0 (*)    Lymphs Abs 0.5 (*)    Abs Immature Granulocytes 0.13 (*)    All other components within normal limits  BLOOD GAS, ARTERIAL - Abnormal; Notable for the following components:   pCO2 arterial  50.5 (*)    pO2, Arterial 76.7 (*)    Bicarbonate 33.0 (*)    Acid-Base Excess 10.0 (*)    All other components within normal limits  RESP PANEL BY RT-PCR (FLU A&B, COVID) [id number]    EKG EKG Interpretation  Date/Time:  Thursday September 16 2021 18:48:09 EDT Ventricular Rate:  56 PR Interval:  130 QRS Duration: 111 QT Interval:  443 QTC Calculation: 428 R Axis:   49 Text Interpretation: Sinus or ectopic atrial rhythm Anteroseptal infarct, old Borderline T abnormalities, inferior leads Confirmed by Noemi Chapel [phone]) on 09/16/2021 7:38:57 PM  Radiology DG Chest Port 1 View  Result Date: 09/16/2021 CLINICAL DATA:  Cough, shortness of breath EXAM: PORTABLE CHEST 1 VIEW COMPARISON:  08/08/2021 FINDINGS: Prior median sternotomy. Heart and mediastinal contours are within normal limits. No focal opacities or effusions. No acute bony abnormality. Aortic atherosclerosis. IMPRESSION: No active cardiopulmonary disease. Electronically Signed   By: Rolm Baptise M.D.   On: 09/16/2021 20:27    Procedures .Critical Care Performed by: Noemi Chapel, MD Authorized by: Noemi Chapel, MD   Critical care provider statement:    Critical care time (minutes):  45   Critical care time was exclusive of:  Separately billable procedures and treating other patients   Critical care was necessary to treat or prevent imminent or life-threatening deterioration of the following conditions:  Respiratory failure   Critical care was time spent personally by me on the following activities:  Development of treatment plan with patient or surrogate, discussions with consultants, evaluation of patient's response to treatment, examination of patient, obtaining history from patient or surrogate, review of old charts, re-evaluation of patient's condition, pulse oximetry, ordering and review of radiographic studies, ordering and review of laboratory studies and ordering and performing treatments and interventions   Care  discussed with: admitting provider     Medications Ordered in ED Medications  0.9 %  sodium chloride infusion ( Intravenous New Bag/Given 09/16/21 2141)  albuterol (PROVENTIL,VENTOLIN) solution continuous neb (has no administration in time range)  magnesium sulfate IVPB 2 g 50 mL (0 g Intravenous Stopped 09/16/21 2149)  methylPREDNISolone sodium succinate (SOLU-MEDROL) 125 mg/2 mL injection 125 mg (125 mg Intravenous Given 09/16/21 2035)  furosemide (LASIX) injection 40 mg (40 mg Intravenous Given 09/16/21 2036)  levofloxacin (LEVAQUIN) tablet 750 mg (750 mg Oral Given 09/16/21 2039)  albuterol (PROVENTIL) (2.5 MG/3ML) 0.083% nebulizer solution 10 mg (10 mg Nebulization Given 09/16/21 2109)    ED Course  I have reviewed the triage vital signs and the nursing notes.  Pertinent labs & imaging results that were available during my care of the patient were reviewed by me and considered in my medical decision making (see chart for details).  Clinical Course as of 09/16/21 2317  Thu Sep 16, 2021  2140 The patient does have a mild acute kidney injury with a bump in his creatinine, he has a chest x-ray without any obvious infiltrate, his laboratory work-up shows that he has a slight anemia, no leukocytosis and his electrolytes are otherwise unremarkable.  His ABG shows a mild alkalosis and he is COVID-negative.  I have given him a continuous albuterol treatment and he  is still fairly severely dyspneic when he tries to get up and even go use the bathroom in the room.  At this time I think he probably needs ongoing treatment in patient with multiple treatments of albuterol and daily steroids, will consult with hospitalist [BM]    Clinical Course User Index [BM] Noemi Chapel, MD   MDM Rules/Calculators/A&P                           This patient most likely has ongoing C OPD, will check for pneumonia with chest x-ray, unfortunately they are critically ill with increasing hypoxia or respiratory  distress and will likely need to be admitted to the hospital which is unfortunate though with progressive severe lung disease is not avoidable in some cases.  The patient will be given a continuous nebulizer, steroids, antibiotics, anticipate admission unless he turns around significantly.  I discussed the case with Dr. Josephine Cables who will admit the patient to the hospital, high level of care, on second continuous nebulizer  Critically ill  Final Clinical Impression(s) / ED Diagnoses Final diagnoses:  Acute respiratory distress  COPD exacerbation ([place])  Peripheral edema     Noemi Chapel, MD 09/16/21 2322

## 2021-09-16 NOTE — ED Notes (Signed)
Sitting on edge of bed doing neb

## 2021-09-17 DIAGNOSIS — D649 Anemia, unspecified: Secondary | ICD-10-CM

## 2021-09-17 DIAGNOSIS — E876 Hypokalemia: Secondary | ICD-10-CM | POA: Diagnosis present

## 2021-09-17 DIAGNOSIS — I5031 Acute diastolic (congestive) heart failure: Secondary | ICD-10-CM | POA: Insufficient documentation

## 2021-09-17 DIAGNOSIS — J441 Chronic obstructive pulmonary disease with (acute) exacerbation: Secondary | ICD-10-CM | POA: Diagnosis present

## 2021-09-17 DIAGNOSIS — Z8616 Personal history of COVID-19: Secondary | ICD-10-CM | POA: Diagnosis not present

## 2021-09-17 DIAGNOSIS — I5032 Chronic diastolic (congestive) heart failure: Secondary | ICD-10-CM | POA: Diagnosis not present

## 2021-09-17 DIAGNOSIS — Z87891 Personal history of nicotine dependence: Secondary | ICD-10-CM | POA: Diagnosis not present

## 2021-09-17 DIAGNOSIS — E7800 Pure hypercholesterolemia, unspecified: Secondary | ICD-10-CM | POA: Diagnosis present

## 2021-09-17 DIAGNOSIS — D539 Nutritional anemia, unspecified: Secondary | ICD-10-CM | POA: Diagnosis present

## 2021-09-17 DIAGNOSIS — J9622 Acute and chronic respiratory failure with hypercapnia: Secondary | ICD-10-CM | POA: Diagnosis present

## 2021-09-17 DIAGNOSIS — I130 Hypertensive heart and chronic kidney disease with heart failure and stage 1 through stage 4 chronic kidney disease, or unspecified chronic kidney disease: Secondary | ICD-10-CM | POA: Diagnosis present

## 2021-09-17 DIAGNOSIS — Z87898 Personal history of other specified conditions: Secondary | ICD-10-CM | POA: Diagnosis not present

## 2021-09-17 DIAGNOSIS — Z8249 Family history of ischemic heart disease and other diseases of the circulatory system: Secondary | ICD-10-CM | POA: Diagnosis not present

## 2021-09-17 DIAGNOSIS — I482 Chronic atrial fibrillation, unspecified: Secondary | ICD-10-CM | POA: Diagnosis present

## 2021-09-17 DIAGNOSIS — Z825 Family history of asthma and other chronic lower respiratory diseases: Secondary | ICD-10-CM | POA: Diagnosis not present

## 2021-09-17 DIAGNOSIS — Z20822 Contact with and (suspected) exposure to covid-19: Secondary | ICD-10-CM | POA: Diagnosis present

## 2021-09-17 DIAGNOSIS — R0602 Shortness of breath: Secondary | ICD-10-CM | POA: Diagnosis present

## 2021-09-17 DIAGNOSIS — N179 Acute kidney failure, unspecified: Secondary | ICD-10-CM | POA: Diagnosis present

## 2021-09-17 DIAGNOSIS — I2510 Atherosclerotic heart disease of native coronary artery without angina pectoris: Secondary | ICD-10-CM | POA: Diagnosis present

## 2021-09-17 DIAGNOSIS — E1165 Type 2 diabetes mellitus with hyperglycemia: Secondary | ICD-10-CM | POA: Diagnosis present

## 2021-09-17 DIAGNOSIS — J9621 Acute and chronic respiratory failure with hypoxia: Secondary | ICD-10-CM | POA: Diagnosis present

## 2021-09-17 DIAGNOSIS — N400 Enlarged prostate without lower urinary tract symptoms: Secondary | ICD-10-CM | POA: Diagnosis present

## 2021-09-17 DIAGNOSIS — H9190 Unspecified hearing loss, unspecified ear: Secondary | ICD-10-CM | POA: Diagnosis present

## 2021-09-17 DIAGNOSIS — I5033 Acute on chronic diastolic (congestive) heart failure: Secondary | ICD-10-CM | POA: Diagnosis present

## 2021-09-17 DIAGNOSIS — T380X5A Adverse effect of glucocorticoids and synthetic analogues, initial encounter: Secondary | ICD-10-CM | POA: Diagnosis present

## 2021-09-17 DIAGNOSIS — E1122 Type 2 diabetes mellitus with diabetic chronic kidney disease: Secondary | ICD-10-CM | POA: Diagnosis present

## 2021-09-17 DIAGNOSIS — T502X5A Adverse effect of carbonic-anhydrase inhibitors, benzothiadiazides and other diuretics, initial encounter: Secondary | ICD-10-CM | POA: Diagnosis present

## 2021-09-17 DIAGNOSIS — Z832 Family history of diseases of the blood and blood-forming organs and certain disorders involving the immune mechanism: Secondary | ICD-10-CM | POA: Diagnosis not present

## 2021-09-17 DIAGNOSIS — N1832 Chronic kidney disease, stage 3b: Secondary | ICD-10-CM | POA: Diagnosis present

## 2021-09-17 LAB — COMPREHENSIVE METABOLIC PANEL
ALT: 18 U/L (ref 0–44)
AST: 18 U/L (ref 15–41)
Albumin: 3.3 g/dL — ABNORMAL LOW (ref 3.5–5.0)
Alkaline Phosphatase: 29 U/L — ABNORMAL LOW (ref 38–126)
Anion gap: 12 (ref 5–15)
BUN: 48 mg/dL — ABNORMAL HIGH (ref 8–23)
CO2: 32 mmol/L (ref 22–32)
Calcium: 8.5 mg/dL — ABNORMAL LOW (ref 8.9–10.3)
Chloride: 97 mmol/L — ABNORMAL LOW (ref 98–111)
Creatinine, Ser: 1.81 mg/dL — ABNORMAL HIGH (ref 0.61–1.24)
GFR, Estimated: 39 mL/min — ABNORMAL LOW (ref >60)
Glucose, Bld: 248 mg/dL — ABNORMAL HIGH (ref 70–99)
Potassium: 3.4 mmol/L — ABNORMAL LOW (ref 3.5–5.1)
Sodium: 141 mmol/L (ref 135–145)
Total Bilirubin: 0.6 mg/dL (ref 0.3–1.2)
Total Protein: 5.9 g/dL — ABNORMAL LOW (ref 6.5–8.1)

## 2021-09-17 LAB — PHOSPHORUS: Phosphorus: 3.5 mg/dL (ref 2.5–4.6)

## 2021-09-17 LAB — VITAMIN B12: Vitamin B-12: 340 pg/mL (ref 180–914)

## 2021-09-17 LAB — FOLATE: Folate: 12.6 ng/mL (ref >5.9)

## 2021-09-17 LAB — CBC
HCT: 34.8 % — ABNORMAL LOW (ref 39.0–52.0)
Hemoglobin: 10.6 g/dL — ABNORMAL LOW (ref 13.0–17.0)
MCH: 31 pg (ref 26.0–34.0)
MCHC: 30.5 g/dL (ref 30.0–36.0)
MCV: 101.8 fL — ABNORMAL HIGH (ref 80.0–100.0)
Platelets: 172 10*3/uL (ref 150–400)
RBC: 3.42 MIL/uL — ABNORMAL LOW (ref 4.22–5.81)
RDW: 13.8 % (ref 11.5–15.5)
WBC: 6.4 10*3/uL (ref 4.0–10.5)
nRBC: 0 % (ref 0.0–0.2)

## 2021-09-17 LAB — GLUCOSE, CAPILLARY
Glucose-Capillary: 150 mg/dL — ABNORMAL HIGH (ref 70–99)
Glucose-Capillary: 137 mg/dL — ABNORMAL HIGH (ref 70–99)
Glucose-Capillary: 160 mg/dL — ABNORMAL HIGH (ref 70–99)

## 2021-09-17 LAB — APTT: aPTT: 23 seconds — ABNORMAL LOW (ref 24–36)

## 2021-09-17 LAB — PROTIME-INR
INR: 1 (ref 0.8–1.2)
Prothrombin Time: 13.7 seconds (ref 11.4–15.2)

## 2021-09-17 LAB — MAGNESIUM: Magnesium: 2.3 mg/dL (ref 1.7–2.4)

## 2021-09-17 LAB — CBG MONITORING, ED: Glucose-Capillary: 145 mg/dL — ABNORMAL HIGH (ref 70–99)

## 2021-09-17 MED ORDER — POTASSIUM CHLORIDE CRYS ER 20 MEQ PO TBCR
20.0000 meq | EXTENDED_RELEASE_TABLET | Freq: Once | ORAL | Status: DC
Start: 2021-09-17 — End: 2021-09-20

## 2021-09-17 MED ORDER — INSULIN ASPART 100 UNIT/ML IJ SOLN
0.0000 [IU] | Freq: Three times a day (TID) | INTRAMUSCULAR | Status: DC
  Administered 2021-09-17: 1 [IU] via SUBCUTANEOUS
  Administered 2021-09-18: 3 [IU] via SUBCUTANEOUS
  Administered 2021-09-18 (×2): 1 [IU] via SUBCUTANEOUS
  Administered 2021-09-19: 2 [IU] via SUBCUTANEOUS
  Administered 2021-09-19: 1 [IU] via SUBCUTANEOUS
  Administered 2021-09-19: 2 [IU] via SUBCUTANEOUS
  Administered 2021-09-20: 1 [IU] via SUBCUTANEOUS
  Administered 2021-09-20: 2 [IU] via SUBCUTANEOUS

## 2021-09-17 MED ORDER — METHYLPREDNISOLONE SODIUM SUCC 40 MG IJ SOLR: 40.0000 mg | Freq: Two times a day (BID) | INTRAMUSCULAR | Status: DC

## 2021-09-17 MED ORDER — DM-GUAIFENESIN ER 30-600 MG PO TB12
1.0000 | ORAL_TABLET | Freq: Two times a day (BID) | ORAL | Status: DC
  Administered 2021-09-17 – 2021-09-20 (×7): 1 via ORAL
  Filled 2021-09-17 (×7): qty 1

## 2021-09-17 MED ORDER — PANTOPRAZOLE SODIUM 40 MG PO TBEC
40.0000 mg | DELAYED_RELEASE_TABLET | Freq: Every day | ORAL | Status: DC
  Administered 2021-09-17 – 2021-09-20 (×4): 40 mg via ORAL
  Filled 2021-09-17 (×4): qty 1

## 2021-09-17 MED ORDER — FUROSEMIDE 10 MG/ML IJ SOLN
40.0000 mg | Freq: Two times a day (BID) | INTRAMUSCULAR | Status: DC
  Administered 2021-09-17 – 2021-09-19 (×5): 40 mg via INTRAVENOUS
  Filled 2021-09-17 (×5): qty 4

## 2021-09-17 MED ORDER — SALINE SPRAY 0.65 % NA SOLN
1.0000 | NASAL | Status: DC | PRN
  Administered 2021-09-17: 1 via NASAL
  Filled 2021-09-17: qty 44

## 2021-09-17 MED ORDER — POTASSIUM CHLORIDE CRYS ER 20 MEQ PO TBCR: 20.0000 meq | EXTENDED_RELEASE_TABLET | Freq: Once | ORAL | Status: DC

## 2021-09-17 MED ORDER — CLOPIDOGREL BISULFATE 75 MG PO TABS
75.0000 mg | ORAL_TABLET | Freq: Every day | ORAL | Status: DC
  Administered 2021-09-17 – 2021-09-20 (×4): 75 mg via ORAL
  Filled 2021-09-17 (×4): qty 1

## 2021-09-17 MED ORDER — METHYLPREDNISOLONE SODIUM SUCC 40 MG IJ SOLR
40.0000 mg | Freq: Two times a day (BID) | INTRAMUSCULAR | Status: DC
  Administered 2021-09-17 – 2021-09-20 (×7): 40 mg via INTRAVENOUS
  Filled 2021-09-17 (×7): qty 1

## 2021-09-17 MED ORDER — AMLODIPINE BESYLATE 5 MG PO TABS
5.0000 mg | ORAL_TABLET | Freq: Every day | ORAL | Status: DC
  Administered 2021-09-17 – 2021-09-20 (×4): 5 mg via ORAL
  Filled 2021-09-17 (×4): qty 1

## 2021-09-17 MED ORDER — AZITHROMYCIN 250 MG PO TABS
500.0000 mg | ORAL_TABLET | Freq: Every day | ORAL | Status: AC
  Administered 2021-09-17: 500 mg via ORAL
  Filled 2021-09-17: qty 2

## 2021-09-17 MED ORDER — UMECLIDINIUM-VILANTEROL 62.5-25 MCG/ACT IN AEPB
1.0000 | INHALATION_SPRAY | Freq: Every day | RESPIRATORY_TRACT | Status: DC
  Administered 2021-09-18 – 2021-09-20 (×3): 1 via RESPIRATORY_TRACT
  Filled 2021-09-17: qty 14

## 2021-09-17 MED ORDER — IPRATROPIUM-ALBUTEROL 0.5-2.5 (3) MG/3ML IN SOLN
3.0000 mL | Freq: Four times a day (QID) | RESPIRATORY_TRACT | Status: DC
  Administered 2021-09-17 – 2021-09-18 (×7): 3 mL via RESPIRATORY_TRACT
  Filled 2021-09-17 (×7): qty 3

## 2021-09-17 MED ORDER — IPRATROPIUM-ALBUTEROL 0.5-2.5 (3) MG/3ML IN SOLN
3.0000 mL | RESPIRATORY_TRACT | Status: DC | PRN
  Filled 2021-09-17: qty 3

## 2021-09-17 MED ORDER — AZITHROMYCIN 250 MG PO TABS
250.0000 mg | ORAL_TABLET | Freq: Every day | ORAL | Status: DC
Start: 2021-09-18 — End: 2021-09-20
  Administered 2021-09-18 – 2021-09-20 (×3): 250 mg via ORAL
  Filled 2021-09-17 (×3): qty 1

## 2021-09-17 NOTE — Plan of Care (Signed)

## 2021-09-17 NOTE — ED Notes (Signed)
Report given to Caryl Asp, RN for room 303. Pt taken to floor via stretcher by ED Tech

## 2021-09-17 NOTE — ED Notes (Signed)
Gave pt warm blanket. Updated vital signs and monitor leads. Emptied urinal. No other needs requested.

## 2021-09-17 NOTE — Progress Notes (Signed)
Patient Demographics:    Daniel Barker, is a 75 y.o. male, DOB - 10-21-1946, [id number]  Admit date - 09/16/2021   Admitting Physician [name], MD  Outpatient Primary MD for the patient is [name], MD  LOS - 0   Chief Complaint  Patient presents with   Shortness of Breath        Subjective:    Daniel Barker today has no fevers, no emesis,  No chest pain,   -Cough, wheezing, shortness of breath persist  Assessment  & Plan :    Active Problems:   Acute kidney injury superimposed on CKD (HCC)   Type 2 diabetes mellitus ([place])   Acute on chronic respiratory failure with hypoxia and hypercapnia (HCC) - baseline PCO2 80-90s, chronically on 2 L/min at home.   Chronic atrial fibrillation (HCC) - not on systemic anticoagulants due to patient's choice.   Chronic diastolic heart failure (HCC)   Steroid-induced hyperglycemia   COPD with acute exacerbation (HCC)   Hypokalemia   Macrocytic anemia   History of peripheral edema  Brief Summary:-  75 y.o. 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 at 2 L/min, COPD, chronic diastolic heart failure, type 2 diabetes mellitus, chronic atrial fibrillation on anticoagulation, CKD stage III, hard of hearing-admitted on 09/16/2021 with COPD and CHF exacerbation resulting in acute on chronic hypoxic respiratory failure with increased oxygen requirement  A/p 1) COPD exacerbation--- continue azithromycin, IV Solu-Medrol, bronchodilators, mucolytics and supplemental oxygen as ordered -Patient with wheezing, dyspnea on exertion and dyspnea at rest -Patient no longer has conversational dyspnea  2)HFpEF--acute on chronic diastolic CHF exacerbation--- last known EF 60 to 65% based on echo from [id number] with diastolic dysfunction noted at that time -Patient presenting with dyspnea on exertion  and increased oxygen requirement, IV Lasix as ordered -Daily weight and fluid input and output monitoring as ordered  3)H/o PAFib--- PTA was not on anticoagulation, appears to be in sinus rhythm at this time--EKG showing sinus bradycardia   4) acute on chronic hypoxic respiratory failure secondary to #1 and #2 above--- at baseline patient uses 2 L of oxygen -This admission patient required up to 4 L of oxygen to keep O2 sats above 88% -O2 sats was down to 85% on 2 L of oxygen, so he was bumped up to 4 L -We will try to wean down O2 as tolerated  5)Aki CKD 3B --Baseline creatinine usually around 1.5-1.6 creatinine was up to 2.1 on admission currently improving -Continue to monitor renal function closely with diuresis  renally adjust medications, avoid nephrotoxic agents / dehydration  / hypotension  6)Hypokalemia--- potassium WNL, replace potassium and monitor closely with IV diuresis  7)Chronic anemia--hemoglobin currently around 10 which is not far from patient's baseline, monitor closely and transfuse as clinically indicated  Disposition/Need for in-Hospital Stay- patient unable to be discharged at this time due to --- acute COPD and CHF exacerbation requiring IV steroids, IV Lasix, and increased oxygen requirement =-Anticipate discharge home in 1 to 2 days if continues to improve with above treatment regimen*  Status is: Inpatient  Remains inpatient appropriate because: Please see disposition above  Disposition: The patient is from: Home  Anticipated d/c is to: Home              Anticipated d/c date is: 2 days              Patient currently is not medically stable to d/c. Barriers: Not Clinically Stable-   Code Status :  -  Code Status: Full Code   Family Communication:    NA (patient is alert, awake and coherent)   Consults  :    DVT Prophylaxis  :   - SCDs  enoxaparin (LOVENOX) injection 30 mg Start: 09/17/21 0800 SCDs Start: 09/16/21 2343    Lab Results   Component Value Date   PLT 172 09/17/2021    Inpatient Medications  Scheduled Meds:  amLODipine  5 mg Oral Daily   azithromycin  500 mg Oral Daily   Followed by   Derrill Memo ON 09/18/2021] azithromycin  250 mg Oral Daily   clopidogrel  75 mg Oral Daily   dextromethorphan-guaiFENesin  1 tablet Oral BID   enoxaparin (LOVENOX) injection  30 mg Subcutaneous Q24H   furosemide  40 mg Intravenous Q12H   insulin aspart  0-9 Units Subcutaneous TID WC   ipratropium-albuterol  3 mL Nebulization Q6H   methylPREDNISolone (SOLU-MEDROL) injection  40 mg Intravenous Q12H   pantoprazole  40 mg Oral Daily   potassium chloride  20 mEq Oral Once   umeclidinium-vilanterol  1 puff Inhalation Daily   Continuous Infusions: PRN Meds:.ipratropium-albuterol    Anti-infectives (From admission, onward)    Start     Dose/Rate Route Frequency Ordered Stop   09/18/21 1000  azithromycin (ZITHROMAX) tablet 250 mg       See Hyperspace for full Linked Orders Report.   250 mg Oral Daily 09/17/21 0158 09/22/21 0959   09/17/21 2000  azithromycin (ZITHROMAX) tablet 500 mg       See Hyperspace for full Linked Orders Report.   500 mg Oral Daily 09/17/21 0158 09/18/21 0959   09/16/21 2015  levofloxacin (LEVAQUIN) tablet 750 mg        750 mg Oral  Once 09/16/21 2013 09/16/21 2039         Objective:   Vitals:   09/17/21 1347 09/17/21 1400 09/17/21 1430 09/17/21 1503  BP:  121/77 (!) 114/58 (!) 150/81  Pulse:  (!) 54 (!) 117 72  Resp:  (!) '30 16 18  '$ Temp:    (!) 97.5 F (36.4 C)  TempSrc:    Oral  SpO2: 96% 100% 100% 94%  Weight:    72.8 kg  Height:        Wt Readings from Last 3 Encounters:  09/17/21 72.8 kg  08/20/21 69.9 kg  08/11/21 80.5 kg     Intake/Output Summary (Last 24 hours) at 09/17/2021 1647 Last data filed at 09/17/2021 1318 Gross per 24 hour  Intake --  Output 2125 ml  Net -2125 ml     Physical Exam  Gen:- Awake Alert, no further conversational dyspnea HEENT:- [place].AT, No  sclera icterus Nose- [place] 4L/min Neck-Supple Neck,No JVD,.  Lungs-diminished breath sounds with scattered wheezing, tachypnea with respiratory rate in the 30s CV- S1, S2 normal, regular , tachycardia with heart rate up to the 120s Abd-  +ve B.Sounds, Abd Soft, No tenderness,    Extremity/Skin:- +ve  edema, pedal pulses present  Psych-affect is appropriate, oriented x3 Neuro-no new focal deficits, no tremors   Data Review:   Micro Results Recent Results (from the past 240 hour(s))  Resp Panel by RT-PCR (  Flu A&B, Covid) Nasopharyngeal Swab     Status: None   Collection Time: 09/16/21  8:18 PM   Specimen: Nasopharyngeal Swab; Nasopharyngeal(NP) swabs in vial transport medium  Result Value Ref Range Status   SARS Coronavirus 2 by RT PCR NEGATIVE NEGATIVE Final    Comment: (NOTE) SARS-CoV-2 target nucleic acids are NOT DETECTED.  The SARS-CoV-2 RNA is generally detectable in upper respiratory specimens during the acute phase of infection. The lowest concentration of SARS-CoV-2 viral copies this assay can detect is 138 copies/mL. A negative result does not preclude SARS-Cov-2 infection and should not be used as the sole basis for treatment or other patient management decisions. A negative result may occur with  improper specimen collection/handling, submission of specimen other than nasopharyngeal swab, presence of viral mutation(s) within the areas targeted by this assay, and inadequate number of viral copies(<138 copies/mL). A negative result must be combined with clinical observations, patient history, and epidemiological information. The expected result is Negative.  Fact Sheet for Patients:  EntrepreneurPulse.com.au  Fact Sheet for Healthcare Providers:  IncredibleEmployment.be  This test is no t yet approved or cleared by the [place] FDA and  has been authorized for detection and/or diagnosis of SARS-CoV-2 by FDA under an Emergency Use  Authorization (EUA). This EUA will remain  in effect (meaning this test can be used) for the duration of the COVID-19 declaration under Section 564(b)(1) of the Act, 21 U.S.C.section 360bbb-3(b)(1), unless the authorization is terminated  or revoked sooner.       Influenza A by PCR NEGATIVE NEGATIVE Final   Influenza B by PCR NEGATIVE NEGATIVE Final    Comment: (NOTE) The Xpert Xpress SARS-CoV-2/FLU/RSV plus assay is intended as an aid in the diagnosis of influenza from Nasopharyngeal swab specimens and should not be used as a sole basis for treatment. Nasal washings and aspirates are unacceptable for Xpert Xpress SARS-CoV-2/FLU/RSV testing.  Fact Sheet for Patients: EntrepreneurPulse.com.au  Fact Sheet for Healthcare Providers: IncredibleEmployment.be  This test is not yet approved or cleared by the [place] FDA and has been authorized for detection and/or diagnosis of SARS-CoV-2 by FDA under an Emergency Use Authorization (EUA). This EUA will remain in effect (meaning this test can be used) for the duration of the COVID-19 declaration under Section 564(b)(1) of the Act, 21 U.S.C. section 360bbb-3(b)(1), unless the authorization is terminated or revoked.  Performed at [facility], [address], [place], [place] [address]     Radiology Reports DG Chest [place] 1 View  Result Date: 09/16/2021 CLINICAL DATA:  Cough, shortness of breath EXAM: PORTABLE CHEST 1 VIEW COMPARISON:  08/08/2021 FINDINGS: Prior median sternotomy. Heart and mediastinal contours are within normal limits. No focal opacities or effusions. No acute bony abnormality. Aortic atherosclerosis. IMPRESSION: No active cardiopulmonary disease. Electronically Signed   By: Rolm Baptise M.D.   On: 09/16/2021 20:27     CBC Recent Labs  Lab 09/16/21 1915 09/17/21 0349  WBC 8.4 6.4  HGB 11.0* 10.6*  HCT 35.8* 34.8*  PLT 192 172  MCV 102.0* 101.8*  MCH 31.3 31.0  MCHC 30.7  30.5  RDW 14.2 13.8  LYMPHSABS 0.5*  --   MONOABS 0.3  --   EOSABS 0.0  --   BASOSABS 0.0  --     Chemistries  Recent Labs  Lab 09/16/21 1915 09/17/21 0349  NA 138 141  K 3.4* 3.4*  CL 92* 97*  CO2 32 32  GLUCOSE 176* 248*  BUN 52* 48*  CREATININE 2.10*  1.81*  CALCIUM 8.3* 8.5*  MG  --  2.3  AST  --  18  ALT  --  18  ALKPHOS  --  29*  BILITOT  --  0.6   ------------------------------------------------------------------------------------------------------------------ No results for input(s): CHOL, HDL, LDLCALC, TRIG, CHOLHDL, LDLDIRECT in the last 72 hours.  Lab Results  Component Value Date   HGBA1C 5.9 (H) 08/08/2021   ------------------------------------------------------------------------------------------------------------------ No results for input(s): TSH, T4TOTAL, T3FREE, THYROIDAB in the last 72 hours.  Invalid input(s): FREET3 ------------------------------------------------------------------------------------------------------------------ Recent Labs    09/17/21 0349  VITAMINB12 340  FOLATE 12.6    Coagulation profile Recent Labs  Lab 09/17/21 0349  INR 1.0    No results for input(s): DDIMER in the last 72 hours.  Cardiac Enzymes No results for input(s): CKMB, TROPONINI, MYOGLOBIN in the last 168 hours.  Invalid input(s): CK ------------------------------------------------------------------------------------------------------------------    Component Value Date/Time   BNP 812.0 (H) 08/08/2021 1510     Brinkley Peet M.D on 09/17/2021 at 4:47 PM  Go to [email or web address] - for contact info  [facility] - Office  [phone]

## 2021-09-18 DIAGNOSIS — N179 Acute kidney failure, unspecified: Secondary | ICD-10-CM | POA: Diagnosis not present

## 2021-09-18 DIAGNOSIS — J441 Chronic obstructive pulmonary disease with (acute) exacerbation: Secondary | ICD-10-CM | POA: Diagnosis not present

## 2021-09-18 DIAGNOSIS — Z87898 Personal history of other specified conditions: Secondary | ICD-10-CM | POA: Diagnosis not present

## 2021-09-18 DIAGNOSIS — I5032 Chronic diastolic (congestive) heart failure: Secondary | ICD-10-CM | POA: Diagnosis not present

## 2021-09-18 LAB — GLUCOSE, CAPILLARY
Glucose-Capillary: 146 mg/dL — ABNORMAL HIGH (ref 70–99)
Glucose-Capillary: 124 mg/dL — ABNORMAL HIGH (ref 70–99)
Glucose-Capillary: 209 mg/dL — ABNORMAL HIGH (ref 70–99)
Glucose-Capillary: 215 mg/dL — ABNORMAL HIGH (ref 70–99)

## 2021-09-18 NOTE — Progress Notes (Signed)
Patient Demographics:    Daniel Barker, is a 75 y.o. male, DOB - Apr 04, 1946, AVW:[id number]  Admit date - 09/16/2021   Admitting Physician Zackary Mckeone Denton Brick, MD  Outpatient Primary MD for the patient is [name], MD  LOS - 1   Chief Complaint  Patient presents with   Shortness of Breath        Subjective:    Daniel Barker today has no fevers, no emesis,  No chest pain,   Increased oxygen requirement, dyspnea cough and wheezing is not worse today -Patient reports significant dyspnea on exertion  Assessment  & Plan :    Active Problems:   Acute kidney injury superimposed on CKD (HCC)   Type 2 diabetes mellitus ([place])   Acute on chronic respiratory failure with hypoxia and hypercapnia (HCC) - baseline PCO2 80-90s, chronically on 2 L/min at home.   Chronic atrial fibrillation (HCC) - not on systemic anticoagulants due to patient's choice.   Chronic diastolic heart failure (HCC)   Steroid-induced hyperglycemia   COPD with acute exacerbation (HCC)   Hypokalemia   Macrocytic anemia   History of peripheral edema  Brief Summary:-  75 y.o. 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 at 2 L/min, COPD, chronic diastolic heart failure, type 2 diabetes mellitus, chronic atrial fibrillation on anticoagulation, CKD stage III, hard of hearing-admitted on 09/16/2021 with COPD and CHF exacerbation resulting in acute on chronic hypoxic respiratory failure with increased oxygen requirement  A/p 1) COPD exacerbation--- continue azithromycin, IV Solu-Medrol, bronchodilators, mucolytics and supplemental oxygen as ordered -Cough, wheezing, dyspnea on exertion persist -Patient no longer has conversational dyspnea -Improving very slowly  2)HFpEF--acute on chronic diastolic CHF exacerbation--- last known EF 60 to 65% based on echo from 07/26/5620 with  diastolic dysfunction noted at that time -Patient presenting with dyspnea on exertion and increased oxygen requirement, -IV Lasix 40 mg every 12 hours  -Daily weight and fluid input and output monitoring as ordered -Overall improving very slowly  3)H/o PAFib--- PTA was not on anticoagulation, appears to be in sinus rhythm at this time--EKG showing sinus bradycardia  4) acute on chronic hypoxic respiratory failure secondary to #1 and #2 above--- at baseline patient uses 2 L of oxygen -This admission patient required up to 4 L of oxygen to keep O2 sats above 88% -O2 sats was down to 85% on 2 L of oxygen, so he was bumped up to 4 L -Increased oxygen requirement and hypoxia improving very slowly  5)Aki CKD 3B --Baseline creatinine usually around 1.5-1.6 creatinine was up to 2.1 on admission  -currently improving -Continue to monitor renal function closely with diuresis  renally adjust medications, avoid nephrotoxic agents / dehydration  / hypotension  6)Hypokalemia--- magnesium WNL, replace potassium and monitor closely with IV diuresis  7)Chronic anemia--hemoglobin currently around 10 which is not far from patient's baseline, monitor closely and transfuse as clinically indicated  Disposition/Need for in-Hospital Stay- patient unable to be discharged at this time due to --- acute COPD and CHF exacerbation requiring IV steroids, IV Lasix, and increased oxygen requirement =-Anticipate discharge home in 1 to 2 days if continues to improve with above treatment regimen  Status is: Inpatient  Remains inpatient appropriate because:  Please see disposition above  Disposition: The patient is from: Home              Anticipated d/c is to: Home              Anticipated d/c date is: 1 day              Patient currently is not medically stable to d/c. Barriers: Not Clinically Stable-   Code Status :  -  Code Status: Full Code   Family Communication:    NA (patient is alert, awake and coherent)    Consults  :    DVT Prophylaxis  :   - SCDs  enoxaparin (LOVENOX) injection 30 mg Start: 09/17/21 0800 SCDs Start: 09/16/21 2343    Lab Results  Component Value Date   PLT 172 09/17/2021    Inpatient Medications  Scheduled Meds:  amLODipine  5 mg Oral Daily   azithromycin  250 mg Oral Daily   clopidogrel  75 mg Oral Daily   dextromethorphan-guaiFENesin  1 tablet Oral BID   enoxaparin (LOVENOX) injection  30 mg Subcutaneous Q24H   furosemide  40 mg Intravenous Q12H   insulin aspart  0-9 Units Subcutaneous TID WC   ipratropium-albuterol  3 mL Nebulization Q6H   methylPREDNISolone (SOLU-MEDROL) injection  40 mg Intravenous Q12H   pantoprazole  40 mg Oral Daily   potassium chloride  20 mEq Oral Once   umeclidinium-vilanterol  1 puff Inhalation Daily   Continuous Infusions: PRN Meds:.ipratropium-albuterol, sodium chloride    Anti-infectives (From admission, onward)    Start     Dose/Rate Route Frequency Ordered Stop   09/18/21 1000  azithromycin (ZITHROMAX) tablet 250 mg       See Hyperspace for full Linked Orders Report.   250 mg Oral Daily 09/17/21 0158 09/22/21 0959   09/17/21 2000  azithromycin (ZITHROMAX) tablet 500 mg       See Hyperspace for full Linked Orders Report.   500 mg Oral Daily 09/17/21 0158 09/17/21 2043   09/16/21 2015  levofloxacin (LEVAQUIN) tablet 750 mg        750 mg Oral  Once 09/16/21 2013 09/16/21 2039         Objective:   Vitals:   09/18/21 0841 09/18/21 0900 09/18/21 1258 09/18/21 1301  BP:   94/62   Pulse:   78   Resp:   18   Temp:   97.7 F (36.5 C)   TempSrc:      SpO2: 94%  97% 95%  Weight:  68.2 kg    Height:        Wt Readings from Last 3 Encounters:  09/18/21 68.2 kg  08/20/21 69.9 kg  08/11/21 80.5 kg     Intake/Output Summary (Last 24 hours) at 09/18/2021 1623 Last data filed at 09/18/2021 1200 Gross per 24 hour  Intake 580 ml  Output 1700 ml  Net -1120 ml     Physical Exam  Gen:- Awake Alert, no  further conversational dyspnea HEENT:- [place].AT, No sclera icterus Nose- [place] 3 to 4 L/min Neck-Supple Neck,No JVD,.  Lungs-diminished breath sounds with scattered wheezing,   CV- S1, S2 normal, regular ,  Abd-  +ve B.Sounds, Abd Soft, No tenderness,    Extremity/Skin:- +ve  edema, pedal pulses present  Psych-affect is appropriate, oriented x3 Neuro-generalized weakness, no new focal deficits, no tremors   Data Review:   Micro Results Recent Results (from the past 240 hour(s))  Resp Panel by RT-PCR (Flu  A&B, Covid) Nasopharyngeal Swab     Status: None   Collection Time: 09/16/21  8:18 PM   Specimen: Nasopharyngeal Swab; Nasopharyngeal(NP) swabs in vial transport medium  Result Value Ref Range Status   SARS Coronavirus 2 by RT PCR NEGATIVE NEGATIVE Final    Comment: (NOTE) SARS-CoV-2 target nucleic acids are NOT DETECTED.  The SARS-CoV-2 RNA is generally detectable in upper respiratory specimens during the acute phase of infection. The lowest concentration of SARS-CoV-2 viral copies this assay can detect is 138 copies/mL. A negative result does not preclude SARS-Cov-2 infection and should not be used as the sole basis for treatment or other patient management decisions. A negative result may occur with  improper specimen collection/handling, submission of specimen other than nasopharyngeal swab, presence of viral mutation(s) within the areas targeted by this assay, and inadequate number of viral copies(<138 copies/mL). A negative result must be combined with clinical observations, patient history, and epidemiological information. The expected result is Negative.  Fact Sheet for Patients:  EntrepreneurPulse.com.au  Fact Sheet for Healthcare Providers:  IncredibleEmployment.be  This test is no t yet approved or cleared by the [place] FDA and  has been authorized for detection and/or diagnosis of SARS-CoV-2 by FDA under an Emergency Use  Authorization (EUA). This EUA will remain  in effect (meaning this test can be used) for the duration of the COVID-19 declaration under Section 564(b)(1) of the Act, 21 U.S.C.section 360bbb-3(b)(1), unless the authorization is terminated  or revoked sooner.       Influenza A by PCR NEGATIVE NEGATIVE Final   Influenza B by PCR NEGATIVE NEGATIVE Final    Comment: (NOTE) The Xpert Xpress SARS-CoV-2/FLU/RSV plus assay is intended as an aid in the diagnosis of influenza from Nasopharyngeal swab specimens and should not be used as a sole basis for treatment. Nasal washings and aspirates are unacceptable for Xpert Xpress SARS-CoV-2/FLU/RSV testing.  Fact Sheet for Patients: EntrepreneurPulse.com.au  Fact Sheet for Healthcare Providers: IncredibleEmployment.be  This test is not yet approved or cleared by the [place] FDA and has been authorized for detection and/or diagnosis of SARS-CoV-2 by FDA under an Emergency Use Authorization (EUA). This EUA will remain in effect (meaning this test can be used) for the duration of the COVID-19 declaration under Section 564(b)(1) of the Act, 21 U.S.C. section 360bbb-3(b)(1), unless the authorization is terminated or revoked.  Performed at [facility], [address] [place], [place] [address]     Radiology Reports DG Chest [place] 1 View  Result Date: 09/16/2021 CLINICAL DATA:  Cough, shortness of breath EXAM: PORTABLE CHEST 1 VIEW COMPARISON:  08/08/2021 FINDINGS: Prior median sternotomy. Heart and mediastinal contours are within normal limits. No focal opacities or effusions. No acute bony abnormality. Aortic atherosclerosis. IMPRESSION: No active cardiopulmonary disease. Electronically Signed   By: Rolm Baptise M.D.   On: 09/16/2021 20:27     CBC Recent Labs  Lab 09/16/21 1915 09/17/21 0349  WBC 8.4 6.4  HGB 11.0* 10.6*  HCT 35.8* 34.8*  PLT 192 172  MCV 102.0* 101.8*  MCH 31.3 31.0  MCHC 30.7  30.5  RDW 14.2 13.8  LYMPHSABS 0.5*  --   MONOABS 0.3  --   EOSABS 0.0  --   BASOSABS 0.0  --     Chemistries  Recent Labs  Lab 09/16/21 1915 09/17/21 0349  NA 138 141  K 3.4* 3.4*  CL 92* 97*  CO2 32 32  GLUCOSE 176* 248*  BUN 52* 48*  CREATININE 2.10* 1.81*  CALCIUM 8.3* 8.5*  MG  --  2.3  AST  --  18  ALT  --  18  ALKPHOS  --  29*  BILITOT  --  0.6   ------------------------------------------------------------------------------------------------------------------ No results for input(s): CHOL, HDL, LDLCALC, TRIG, CHOLHDL, LDLDIRECT in the last 72 hours.  Lab Results  Component Value Date   HGBA1C 5.9 (H) 08/08/2021   ------------------------------------------------------------------------------------------------------------------ No results for input(s): TSH, T4TOTAL, T3FREE, THYROIDAB in the last 72 hours.  Invalid input(s): FREET3 ------------------------------------------------------------------------------------------------------------------ Recent Labs    09/17/21 0349  VITAMINB12 340  FOLATE 12.6    Coagulation profile Recent Labs  Lab 09/17/21 0349  INR 1.0    No results for input(s): DDIMER in the last 72 hours.  Cardiac Enzymes No results for input(s): CKMB, TROPONINI, MYOGLOBIN in the last 168 hours.  Invalid input(s): CK ------------------------------------------------------------------------------------------------------------------    Component Value Date/Time   BNP 812.0 (H) 08/08/2021 1510     Fleta Borgeson M.D on 09/18/2021 at 4:23 PM  Go to [email or web address] - for contact info  [facility] - Office  [phone]

## 2021-09-19 DIAGNOSIS — I482 Chronic atrial fibrillation, unspecified: Secondary | ICD-10-CM | POA: Diagnosis not present

## 2021-09-19 DIAGNOSIS — I5033 Acute on chronic diastolic (congestive) heart failure: Secondary | ICD-10-CM | POA: Diagnosis not present

## 2021-09-19 DIAGNOSIS — J441 Chronic obstructive pulmonary disease with (acute) exacerbation: Secondary | ICD-10-CM | POA: Diagnosis not present

## 2021-09-19 DIAGNOSIS — J9621 Acute and chronic respiratory failure with hypoxia: Secondary | ICD-10-CM | POA: Diagnosis not present

## 2021-09-19 LAB — CBC
HCT: 36.5 % — ABNORMAL LOW (ref 39.0–52.0)
Hemoglobin: 11.5 g/dL — ABNORMAL LOW (ref 13.0–17.0)
MCH: 31.2 pg (ref 26.0–34.0)
MCHC: 31.5 g/dL (ref 30.0–36.0)
MCV: 98.9 fL (ref 80.0–100.0)
Platelets: 183 10*3/uL (ref 150–400)
RBC: 3.69 MIL/uL — ABNORMAL LOW (ref 4.22–5.81)
RDW: 13.4 % (ref 11.5–15.5)
WBC: 10.9 10*3/uL — ABNORMAL HIGH (ref 4.0–10.5)
nRBC: 0 % (ref 0.0–0.2)

## 2021-09-19 LAB — GLUCOSE, CAPILLARY
Glucose-Capillary: 137 mg/dL — ABNORMAL HIGH (ref 70–99)
Glucose-Capillary: 185 mg/dL — ABNORMAL HIGH (ref 70–99)
Glucose-Capillary: 207 mg/dL — ABNORMAL HIGH (ref 70–99)
Glucose-Capillary: 188 mg/dL — ABNORMAL HIGH (ref 70–99)

## 2021-09-19 LAB — RENAL FUNCTION PANEL
Albumin: 3.2 g/dL — ABNORMAL LOW (ref 3.5–5.0)
Anion gap: 8 (ref 5–15)
BUN: 43 mg/dL — ABNORMAL HIGH (ref 8–23)
CO2: 38 mmol/L — ABNORMAL HIGH (ref 22–32)
Calcium: 8.9 mg/dL (ref 8.9–10.3)
Chloride: 93 mmol/L — ABNORMAL LOW (ref 98–111)
Creatinine, Ser: 1.83 mg/dL — ABNORMAL HIGH (ref 0.61–1.24)
GFR, Estimated: 38 mL/min — ABNORMAL LOW (ref >60)
Glucose, Bld: 196 mg/dL — ABNORMAL HIGH (ref 70–99)
Phosphorus: 2.7 mg/dL (ref 2.5–4.6)
Potassium: 3.5 mmol/L (ref 3.5–5.1)
Sodium: 139 mmol/L (ref 135–145)

## 2021-09-19 MED ORDER — IPRATROPIUM-ALBUTEROL 0.5-2.5 (3) MG/3ML IN SOLN
3.0000 mL | Freq: Three times a day (TID) | RESPIRATORY_TRACT | Status: DC
  Administered 2021-09-19 – 2021-09-20 (×5): 3 mL via RESPIRATORY_TRACT
  Filled 2021-09-19 (×5): qty 3

## 2021-09-19 MED ORDER — TORSEMIDE 20 MG PO TABS
30.0000 mg | ORAL_TABLET | Freq: Every day | ORAL | Status: DC
  Administered 2021-09-20: 30 mg via ORAL
  Filled 2021-09-19: qty 2

## 2021-09-19 MED ORDER — ENOXAPARIN SODIUM 40 MG/0.4ML IJ SOSY
40.0000 mg | PREFILLED_SYRINGE | INTRAMUSCULAR | Status: DC
  Administered 2021-09-20: 40 mg via SUBCUTANEOUS
  Filled 2021-09-19: qty 0.4

## 2021-09-19 NOTE — Progress Notes (Signed)
Patient Demographics:    Daniel Barker, is a 75 y.o. male, DOB - November 04, 1946, YOM:[id number]  Admit date - 09/16/2021   Admitting Physician Courage Denton Brick, MD  Outpatient Primary MD for the patient is [name], MD  LOS - 2   Chief Complaint  Patient presents with   Shortness of Breath        Subjective:    Daniel Barker reports feeling better today; denies chest pain, fever, nausea, vomiting or significant difficulty to speak in full sentences.  Continues present intermittent coughing spells, sore throat and mild dyspnea on exertion.  Currently denying orthopnea.   Assessment  & Plan :    Active Problems:   Acute kidney injury superimposed on CKD (HCC)   Type 2 diabetes mellitus ([place])   Acute on chronic respiratory failure with hypoxia and hypercapnia (HCC) - baseline PCO2 80-90s, chronically on 2 L/min at home.   Chronic atrial fibrillation (HCC) - not on systemic anticoagulants due to patient's choice.   Chronic diastolic heart failure (HCC)   Steroid-induced hyperglycemia   COPD with acute exacerbation (HCC)   Hypokalemia   Macrocytic anemia   History of peripheral edema  Brief Summary:-  75 y.o. 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 -Cough, wheezing, dyspnea on exertion persist -Patient no longer has conversational dyspnea and expressed overall improvement.  Anticipate discharge home in  the next 24 hours.  2)HFpEF--acute on chronic diastolic CHF exacerbation--- last known EF 60 to 65% based on echo from 03/11/2394 with diastolic dysfunction noted at that time. -Patient presenting with dyspnea on exertion and increased oxygen requirement,  -At time of admission on physical exam patient demonstrated increased lower extremity swelling, fine crackles and expressed orthopnea. -Adequately treated with IV diuresis; after today's dose will transition to adjusted dose of oral diuretics. -Continue to follow low-sodium diet, daily weights and strict I's and O's.  3)H/o PAFib--- PTA was not on anticoagulation, appears to be in sinus rhythm at this time--EKG showing sinus bradycardia. -Continue telemetry monitoring. -Continue to maintain electrolytes within normal limits.  4) acute on chronic hypoxic respiratory failure secondary to #1 and #2 as mentioned above--- at baseline patient uses 2 L of oxygen. -This admission patient required up to 4 L of oxygen to keep O2 sats above 88%. -O2 sats was down to 85% on 2 L of oxygen at time of presentation, so he was bumped up to 4 L on admission. -Continue treatment as mentioned above for COPD and CHF exacerbation -Continue to wean oxygen down to his baseline as tolerated. -Currently using 3 L.  5)Aki CKD 3B --Baseline creatinine usually around 1.5-1.6 creatinine was up to 2.1 on admission  -currently improving/trending down appropriately. -Continue to monitor renal function closely with diuresis  renally adjust medications, avoid nephrotoxic agents / dehydration  / hypotension.  6)Hypokalemia--- magnesium WNL -Continue to follow potassium trend  and further replete as needed -Patient's hypokalemia in the setting of diuretics usage.  7)Chronic anemia--hemoglobin currently around 10 which is not far from patient's baseline. -Continue to follow hemoglobin trend. -Will transfuse as needed.  Disposition/Need for in-Hospital Stay- patient unable  to be discharged at this time due to --- acute COPD and CHF exacerbation requiring IV steroids, IV Lasix, and increased oxygen requirement =-Anticipate discharge home in 1 to 2 days if continues to improve with above treatment regimen  Status is: Inpatient  Remains inpatient appropriate because: Please see disposition above  Disposition: The patient is from: Home              Anticipated d/c is to: Home              Anticipated d/c date is: 1 day              Patient currently is not medically stable to d/c. Barriers: Not Clinically Stable-   Code Status :  -  Code Status: Full Code   Family Communication:    NA (patient is alert, awake and coherent)   Consults  :    DVT Prophylaxis  :   - SCDs  enoxaparin (LOVENOX) injection 40 mg Start: 09/20/21 0800 SCDs Start: 09/16/21 2343    Lab Results  Component Value Date   PLT 183 09/19/2021    Inpatient Medications  Scheduled Meds:  amLODipine  5 mg Oral Daily   azithromycin  250 mg Oral Daily   clopidogrel  75 mg Oral Daily   dextromethorphan-guaiFENesin  1 tablet Oral BID   [START ON 09/20/2021] enoxaparin (LOVENOX) injection  40 mg Subcutaneous Q24H   insulin aspart  0-9 Units Subcutaneous TID WC   ipratropium-albuterol  3 mL Nebulization TID   methylPREDNISolone (SOLU-MEDROL) injection  40 mg Intravenous Q12H   pantoprazole  40 mg Oral Daily   potassium chloride  20 mEq Oral Once   [START ON 09/20/2021] torsemide  30 mg Oral Daily   umeclidinium-vilanterol  1 puff Inhalation Daily   Continuous Infusions: PRN Meds:.ipratropium-albuterol, sodium chloride    Anti-infectives (From admission, onward)    Start     Dose/Rate Route Frequency Ordered Stop   09/18/21 1000  azithromycin (ZITHROMAX) tablet 250 mg       See Hyperspace for full Linked Orders Report.   250 mg Oral Daily 09/17/21 0158 09/22/21 0959   09/17/21 2000  azithromycin (ZITHROMAX) tablet 500 mg       See Hyperspace for full Linked Orders Report.   500  mg Oral Daily 09/17/21 0158 09/17/21 2043   09/16/21 2015  levofloxacin (LEVAQUIN) tablet 750 mg        750 mg Oral  Once 09/16/21 2013 09/16/21 2039         Objective:   Vitals:   09/19/21 0500 09/19/21 0829 09/19/21 1354 09/19/21 1356  BP: 107/62  110/68   Pulse: 70  63   Resp: 18  17   Temp: 98.4 F (36.9 C)  97.8 F (36.6 C)   TempSrc:   Oral   SpO2: 95% 97%  93%  Weight: 67.8 kg     Height:        Wt Readings from Last 3 Encounters:  09/19/21 67.8 kg  08/20/21 69.9 kg  08/11/21 80.5 kg     Intake/Output Summary (Last 24 hours) at 09/19/2021 1808 Last data filed at 09/19/2021 1426 Gross per 24 hour  Intake --  Output 2550 ml  Net -2550  ml     Physical Exam General exam: Alert, awake, oriented x 3; reports breathing is slightly better today; still expressing dyspnea on exertion and intermittent cough.  No nausea, no vomiting, no chest pain.  Patient is afebrile Respiratory system: Decreased breath sounds at the bases; no frank crackles.  Positive rhonchi bilaterally.  Mild expiratory wheezing appreciated on examination. Cardiovascular system: Rate controlled, no rubs, no gallops, no JVD. Gastrointestinal system: Abdomen is nondistended, soft and nontender. No organomegaly or masses felt. Normal bowel sounds heard. Central nervous system: Alert and oriented. No focal neurological deficits. Extremities: No cyanosis or clubbing; trace to 1+ edema appreciated on exam. Skin: No petechiae. Psychiatry: Judgement and insight appear normal. Mood & affect appropriate.     Data Review:   Micro Results Recent Results (from the past 240 hour(s))  Resp Panel by RT-PCR (Flu A&B, Covid) Nasopharyngeal Swab     Status: None   Collection Time: 09/16/21  8:18 PM   Specimen: Nasopharyngeal Swab; Nasopharyngeal(NP) swabs in vial transport medium  Result Value Ref Range Status   SARS Coronavirus 2 by RT PCR NEGATIVE NEGATIVE Final    Comment: (NOTE) SARS-CoV-2 target nucleic  acids are NOT DETECTED.  The SARS-CoV-2 RNA is generally detectable in upper respiratory specimens during the acute phase of infection. The lowest concentration of SARS-CoV-2 viral copies this assay can detect is 138 copies/mL. A negative result does not preclude SARS-Cov-2 infection and should not be used as the sole basis for treatment or other patient management decisions. A negative result may occur with  improper specimen collection/handling, submission of specimen other than nasopharyngeal swab, presence of viral mutation(s) within the areas targeted by this assay, and inadequate number of viral copies(<138 copies/mL). A negative result must be combined with clinical observations, patient history, and epidemiological information. The expected result is Negative.  Fact Sheet for Patients:  EntrepreneurPulse.com.au  Fact Sheet for Healthcare Providers:  IncredibleEmployment.be  This test is no t yet approved or cleared by the [place] FDA and  has been authorized for detection and/or diagnosis of SARS-CoV-2 by FDA under an Emergency Use Authorization (EUA). This EUA will remain  in effect (meaning this test can be used) for the duration of the COVID-19 declaration under Section 564(b)(1) of the Act, 21 U.S.C.section 360bbb-3(b)(1), unless the authorization is terminated  or revoked sooner.       Influenza A by PCR NEGATIVE NEGATIVE Final   Influenza B by PCR NEGATIVE NEGATIVE Final    Comment: (NOTE) The Xpert Xpress SARS-CoV-2/FLU/RSV plus assay is intended as an aid in the diagnosis of influenza from Nasopharyngeal swab specimens and should not be used as a sole basis for treatment. Nasal washings and aspirates are unacceptable for Xpert Xpress SARS-CoV-2/FLU/RSV testing.  Fact Sheet for Patients: EntrepreneurPulse.com.au  Fact Sheet for Healthcare Providers: IncredibleEmployment.be  This  test is not yet approved or cleared by the [place] FDA and has been authorized for detection and/or diagnosis of SARS-CoV-2 by FDA under an Emergency Use Authorization (EUA). This EUA will remain in effect (meaning this test can be used) for the duration of the COVID-19 declaration under Section 564(b)(1) of the Act, 21 U.S.C. section 360bbb-3(b)(1), unless the authorization is terminated or revoked.  Performed at [facility], [address], [place], [place] [address]     Radiology Reports DG Chest [place] 1 View  Result Date: 09/16/2021 CLINICAL DATA:  Cough, shortness of breath EXAM: PORTABLE CHEST 1 VIEW COMPARISON:  08/08/2021 FINDINGS: Prior median sternotomy. Heart  and mediastinal contours are within normal limits. No focal opacities or effusions. No acute bony abnormality. Aortic atherosclerosis. IMPRESSION: No active cardiopulmonary disease. Electronically Signed   By: Rolm Baptise M.D.   On: 09/16/2021 20:27     CBC Recent Labs  Lab 09/16/21 1915 09/17/21 0349 09/19/21 0450  WBC 8.4 6.4 10.9*  HGB 11.0* 10.6* 11.5*  HCT 35.8* 34.8* 36.5*  PLT 192 172 183  MCV 102.0* 101.8* 98.9  MCH 31.3 31.0 31.2  MCHC 30.7 30.5 31.5  RDW 14.2 13.8 13.4  LYMPHSABS 0.5*  --   --   MONOABS 0.3  --   --   EOSABS 0.0  --   --   BASOSABS 0.0  --   --     Chemistries  Recent Labs  Lab 09/16/21 1915 09/17/21 0349 09/19/21 0450  NA 138 141 139  K 3.4* 3.4* 3.5  CL 92* 97* 93*  CO2 32 32 38*  GLUCOSE 176* 248* 196*  BUN 52* 48* 43*  CREATININE 2.10* 1.81* 1.83*  CALCIUM 8.3* 8.5* 8.9  MG  --  2.3  --   AST  --  18  --   ALT  --  18  --   ALKPHOS  --  29*  --   BILITOT  --  0.6  --    Recent Labs    09/17/21 0349  VITAMINB12 340  FOLATE 12.6    Coagulation profile Recent Labs  Lab 09/17/21 0349  INR 1.0    ------------------------------------------------------------------------------------------------------------------    Component Value Date/Time   BNP  812.0 (H) 08/08/2021 1510     Barton Dubois M.D on 09/19/2021 at 6:08 PM  Go to [email or web address] - for contact info  [facility] - Office  [phone]

## 2021-09-20 DIAGNOSIS — I5033 Acute on chronic diastolic (congestive) heart failure: Secondary | ICD-10-CM | POA: Diagnosis not present

## 2021-09-20 DIAGNOSIS — J9621 Acute and chronic respiratory failure with hypoxia: Secondary | ICD-10-CM | POA: Diagnosis not present

## 2021-09-20 DIAGNOSIS — J441 Chronic obstructive pulmonary disease with (acute) exacerbation: Secondary | ICD-10-CM | POA: Diagnosis not present

## 2021-09-20 DIAGNOSIS — I482 Chronic atrial fibrillation, unspecified: Secondary | ICD-10-CM | POA: Diagnosis not present

## 2021-09-20 LAB — GLUCOSE, CAPILLARY
Glucose-Capillary: 171 mg/dL — ABNORMAL HIGH (ref 70–99)
Glucose-Capillary: 139 mg/dL — ABNORMAL HIGH (ref 70–99)
Glucose-Capillary: 277 mg/dL — ABNORMAL HIGH (ref 70–99)

## 2021-09-20 MED ORDER — DM-GUAIFENESIN ER 30-600 MG PO TB12: 1.0000 | ORAL_TABLET | Freq: Two times a day (BID) | ORAL | 1 refills | Status: DC

## 2021-09-20 MED ORDER — TORSEMIDE 20 MG PO TABS: 20.0000 mg | ORAL_TABLET | Freq: Every day | ORAL | 1 refills | Status: DC

## 2021-09-20 MED ORDER — AZITHROMYCIN 250 MG PO TABS: 250.0000 mg | ORAL_TABLET | Freq: Every day | ORAL | 0 refills | Status: DC

## 2021-09-20 MED ORDER — PREDNISONE 10 MG PO TABS: 10.0000 mg | ORAL_TABLET | Freq: Every day | ORAL | 0 refills | Status: DC

## 2021-09-20 MED ORDER — POTASSIUM CHLORIDE CRYS ER 20 MEQ PO TBCR: 20.0000 meq | EXTENDED_RELEASE_TABLET | Freq: Every day | ORAL | 0 refills | Status: DC

## 2021-09-20 MED ORDER — PREDNISONE 10 MG PO TABS: ORAL_TABLET | ORAL | 0 refills | Status: DC

## 2021-09-20 NOTE — Progress Notes (Signed)
Discharge instructions read to patient family member Futures trader.  She verbalized understanding.  Discharged to home with family.

## 2021-09-20 NOTE — Discharge Summary (Signed)
Physician Discharge Summary  Daniel Barker YPP:[id number] DOB: 08-25-1946 DOA: 09/16/2021  PCP: [name], MD  Admit date: 09/16/2021 Discharge date: 09/20/2021  Time spent: 35 minutes  Recommendations for Outpatient Follow-up:  Repeat basic metabolic panel to follow close renal function Reassess patient volume status and further adjust diuretic management Make sure patient has follow-up with pulmonology service as instructed to further adjust maintenance of his COPD therapy. Will recommend discussion about goals of care and advanced home care planning.  Discharge Diagnoses:  Active Problems:   Acute kidney injury superimposed on CKD (HCC)   Type 2 diabetes mellitus ([place])   Acute on chronic respiratory failure with hypoxia and hypercapnia (HCC) - baseline PCO2 80-90s, chronically on 2 L/min at home.   Chronic atrial fibrillation (HCC) - not on systemic anticoagulants due to patient's choice.   Acute on chronic diastolic CHF (congestive heart failure) (HCC)   Steroid-induced hyperglycemia   COPD with acute exacerbation (HCC)   Hypokalemia   Macrocytic anemia   History of peripheral edema   Discharge Condition: Stable and improved.  Discharged home with instruction to follow-up with PCP and pulmonology service.  CODE STATUS: Full code  Diet recommendation: Heart healthy/low-sodium diet.  Filed Weights   09/18/21 0900 09/19/21 0500 09/20/21 0500  Weight: 68.2 kg 67.8 kg 67.8 kg    History of present illness:  As per H&P written by Dr. Josephine Cables on 09/16/2021 Daniel Barker is a 75 y.o. 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  Patient states that he has home health nurse who noted some coarse breath sounds when he was seen on Monday (10/17) and on being seen yesterday, it was noted that his breathing was labored, so EMS was activated, supplemental oxygen was increased to 4 L en route to the ED.  Patient endorsed increased leg swelling bilaterally which he states was chronic.   ED Course:  In the emergency department, he was hemodynamically stable, intermittently tachypneic and other vital signs were within normal range.  Work-up in the ED showed macrocytic anemia, hypokalemia, BUN/Cr at 52/2.10 (baseline creatinine at 1.3-1.7).  ABG showed pH 7.448, PCO2 50.5, PO2 76.7, bicarb 33.0.  Influenza a, B, SARS coronavirus 2 was negative. Chest x-ray showed no active cardiopulmonary disease. Breathing treatment was provided, IV Lasix 40x1 was given, Levaquin was given, IV Solu-Medrol 25 mg x 1 was given.  Hospitalist was asked to admit.  For further evaluation and management.  Hospital Course:  1) COPD exacerbation- -Significant improvement in his symptoms; speaking in full sentences and oxygen saturation back to baseline. -Patient will be discharged on the steroids tapering, resumption of bronchodilator management, the use of mucolytic's and completion of oral antibiotics. -Instructed to follow-up with pulmonology service as previously arranged.   2)HFpEF--acute on chronic diastolic CHF exacerbation--- last known EF 60 to 65% based on echo from 4/58/0998 with diastolic dysfunction noted at that time. -At time of admission on physical exam patient demonstrated increased lower extremity swelling, fine crackles and expressed orthopnea.  Patient was requiring higher level of oxygen supplementation as well. -Adequately  treated with IV diuresis;  -No swelling in his legs, no orthopnea,  speaking in full sentences and reporting a stabilization in his breathing at time of discharge. -Patient will go home on daily torsemide. -Instructed to be compliant with low-sodium diet and to check his weight on daily basis.   3)H/o PAFib--- PTA was not on anticoagulation, appears to be in sinus rhythm at this time--EKG showing sinus bradycardia. -Continue to maintain electrolytes within normal limits. -Patient denies palpitations or heart skipping rhythm.   4) acute on chronic hypoxic respiratory failure secondary to #1 and #2 as mentioned above--- at baseline patient uses 2 L of oxygen. -This admission patient required up to 4 L of oxygen to keep O2 sats above 88%. -O2 sats was down to 85% on 2 L of oxygen at time of presentation, so he was bumped up to 4 L on admission. -Continue treatment as mentioned above for COPD and CHF exacerbation -Patient oxygen supplementation back to 2 L at time of discharge.  Good saturation appreciated on exam.   5)Aki CKD 3B --Baseline creatinine usually around 1.5-1.6 creatinine was up to 2.1 on admission  -currently improving/trending down appropriately and back to baseline at discharge. -Continue to follow-up renal function trend with repeat basic metabolic panel follow-up visit.   6)Hypokalemia--- magnesium WNL -Continue to follow potassium trend and further replete as needed -Patient's hypokalemia is secondary to diuretics usage. -Has been discharged on daily maintenance supplementation.   7)Chronic anemia--hemoglobin currently around 10 which is not far from patient's baseline. -Will recommend intermittent CBC assessments to follow hemoglobin trend. -Will transfuse as needed.    Procedures: See below for x-ray reports.  Consultations: None  Discharge Exam: Vitals:   09/20/21 0824 09/20/21 0835  BP:  116/76  Pulse:    Resp:    Temp:    SpO2: 96%     General: Afebrile, no chest pain, no nausea, no vomiting.  Speaking in full sentences  and feeling breathing back to baseline at discharge. Cardiovascular: S1 and S2, sinus rhythm; no rubs, no gallops, no JVD. Respiratory: Improved air movement bilaterally; positive rhonchi on exam, no significant expiratory wheezing or crackles appreciated.  Patient with normal effort and no using accessory muscles. Abdomen: Soft, nontender, nondistended, positive bowel sounds Extremities: No cyanosis, no clubbing, no edema.  Discharge Instructions   Discharge Instructions     (HEART FAILURE PATIENTS) Call MD:  Anytime you have any of the following symptoms: 1) 3 pound weight gain in 24 hours or 5 pounds in 1 week 2) shortness of breath, with or without a dry hacking cough 3) swelling in the hands, feet or stomach 4) if you have to sleep on extra pillows at night in order to breathe.   Complete by: As directed    Diet - low sodium heart healthy   Complete by: As directed    Discharge instructions   Complete by: As directed    Take medications as prescribed Maintain adequate hydration Increase activity slowly Resume instructions and recommendations by home health services. Check weight on daily basis Follow low sodium diet. Follow up with PCP and pulmonology after discharge.      Allergies as of 09/20/2021       Reactions   Lipitor [atorvastatin]    Unknown reaction        Medication List     TAKE these medications    acetaminophen 500 MG tablet Commonly known as: TYLENOL Take 1,000 mg by mouth every 6 (six) hours as  needed for mild pain.   albuterol 108 (90 Base) MCG/ACT inhaler Commonly known as: VENTOLIN HFA INHALE 2 PUFFS BY MOUTH EVERY 4 HOURS AS NEEDED What changed:  how much to take reasons to take this   albuterol (2.5 MG/3ML) 0.083% nebulizer solution Commonly known as: PROVENTIL INHALE CONTENTS OF 1 VIAL IN NEBULIZER EVERY 6 HOURS AS NEEDED FOR WHEEZING/SHORTNESS OF BREATH What changed: See the new instructions.   amLODipine 5 MG tablet Commonly known  as: NORVASC Take 5 mg by mouth daily.   azithromycin 250 MG tablet Commonly known as: ZITHROMAX Take 1 tablet (250 mg total) by mouth daily.   bisoprolol 5 MG tablet Commonly known as: ZEBETA Take 0.5 tablets (2.5 mg total) by mouth daily. HOLD FOR  HEART RATE LESS THAN 65 OR SBP LESS THAN 95   budesonide-formoterol 160-4.5 MCG/ACT inhaler Commonly known as: Symbicort Take 2 puffs first thing in am and then another 2 puffs about 12 hours later. What changed:  how much to take how to take this when to take this   clopidogrel 75 MG tablet Commonly known as: PLAVIX Take 75 mg by mouth daily.   dextromethorphan-guaiFENesin 30-600 MG 12hr tablet Commonly known as: MUCINEX DM Take 1 tablet by mouth 2 (two) times daily.   fenofibrate micronized 134 MG capsule Commonly known as: LOFIBRA Take 134 mg by mouth daily before breakfast.   fluticasone 50 MCG/ACT nasal spray Commonly known as: FLONASE Place 2 sprays into both nostrils in the morning and at bedtime.   montelukast 10 MG tablet Commonly known as: SINGULAIR Take 10 mg by mouth at bedtime.   pantoprazole 40 MG tablet Commonly known as: PROTONIX Take 1 tablet (40 mg total) by mouth 2 (two) times daily.   potassium chloride SA 20 MEQ tablet Commonly known as: KLOR-CON Take 1 tablet (20 mEq total) by mouth daily.   predniSONE 10 MG tablet Commonly known as: DELTASONE Take 1 tablet (10 mg total) by mouth daily. What changed: You were already taking a medication with the same name, and this prescription was added. Make sure you understand how and when to take each.   predniSONE 10 MG tablet Commonly known as: DELTASONE Take 5 tablets daily X 1 day; then 4 tablets daily X 2 days, then 3 tablets daily X 2 days, then 2 tablets daily X 4 days; and then resume 1 tab daily What changed: additional instructions   Spiriva Respimat 2.5 MCG/ACT Aers Generic drug: Tiotropium Bromide Monohydrate Inhale 2 puffs into the lungs  daily. What changed: Another medication with the same name was removed. Continue taking this medication, and follow the directions you see here.   tamsulosin 0.4 MG Caps capsule Commonly known as: FLOMAX TAKE 1 CAPSULE(0.4 MG) BY MOUTH DAILY What changed: See the new instructions.   torsemide 20 MG tablet Commonly known as: Demadex Take 1 tablet (20 mg total) by mouth daily. What changed: when to take this   zinc sulfate 220 (50 Zn) MG capsule Take 1 capsule (220 mg total) by mouth daily.       Allergies  Allergen Reactions   Lipitor [Atorvastatin]     Unknown reaction    Follow-up Information     [name], MD. Schedule an appointment as soon as possible for a visit in 10 day(s).   Specialty: Internal Medicine Contact information: [address] [facility] [address] [phone]         Josue Hector, MD .   Specialty:  Cardiology Contact information: [address] [place] [address] [phone]                  The results of significant diagnostics from this hospitalization (including imaging, microbiology, ancillary and laboratory) are listed below for reference.    Significant Diagnostic Studies: DG Chest Port 1 View  Result Date: 09/16/2021 CLINICAL DATA:  Cough, shortness of breath EXAM: PORTABLE CHEST 1 VIEW COMPARISON:  08/08/2021 FINDINGS: Prior median sternotomy. Heart and mediastinal contours are within normal limits. No focal opacities or effusions. No acute bony abnormality. Aortic atherosclerosis. IMPRESSION: No active cardiopulmonary disease. Electronically Signed   By: Rolm Baptise M.D.   On: 09/16/2021 20:27    Microbiology: Recent Results (from the past 240 hour(s))  Resp Panel by RT-PCR (Flu A&B, Covid) Nasopharyngeal Swab     Status: None   Collection Time: 09/16/21  8:18 PM   Specimen: Nasopharyngeal Swab; Nasopharyngeal(NP) swabs in vial transport medium  Result Value Ref Range Status   SARS  Coronavirus 2 by RT PCR NEGATIVE NEGATIVE Final    Comment: (NOTE) SARS-CoV-2 target nucleic acids are NOT DETECTED.  The SARS-CoV-2 RNA is generally detectable in upper respiratory specimens during the acute phase of infection. The lowest concentration of SARS-CoV-2 viral copies this assay can detect is 138 copies/mL. A negative result does not preclude SARS-Cov-2 infection and should not be used as the sole basis for treatment or other patient management decisions. A negative result may occur with  improper specimen collection/handling, submission of specimen other than nasopharyngeal swab, presence of viral mutation(s) within the areas targeted by this assay, and inadequate number of viral copies(<138 copies/mL). A negative result must be combined with clinical observations, patient history, and epidemiological information. The expected result is Negative.  Fact Sheet for Patients:  EntrepreneurPulse.com.au  Fact Sheet for Healthcare Providers:  IncredibleEmployment.be  This test is no t yet approved or cleared by the [place] FDA and  has been authorized for detection and/or diagnosis of SARS-CoV-2 by FDA under an Emergency Use Authorization (EUA). This EUA will remain  in effect (meaning this test can be used) for the duration of the COVID-19 declaration under Section 564(b)(1) of the Act, 21 U.S.C.section 360bbb-3(b)(1), unless the authorization is terminated  or revoked sooner.       Influenza A by PCR NEGATIVE NEGATIVE Final   Influenza B by PCR NEGATIVE NEGATIVE Final    Comment: (NOTE) The Xpert Xpress SARS-CoV-2/FLU/RSV plus assay is intended as an aid in the diagnosis of influenza from Nasopharyngeal swab specimens and should not be used as a sole basis for treatment. Nasal washings and aspirates are unacceptable for Xpert Xpress SARS-CoV-2/FLU/RSV testing.  Fact Sheet for  Patients: EntrepreneurPulse.com.au  Fact Sheet for Healthcare Providers: IncredibleEmployment.be  This test is not yet approved or cleared by the [place] FDA and has been authorized for detection and/or diagnosis of SARS-CoV-2 by FDA under an Emergency Use Authorization (EUA). This EUA will remain in effect (meaning this test can be used) for the duration of the COVID-19 declaration under Section 564(b)(1) of the Act, 21 U.S.C. section 360bbb-3(b)(1), unless the authorization is terminated or revoked.  Performed at [facility], [address] [place], [place] [address]      Labs: Basic Metabolic Panel: Recent Labs  Lab 09/16/21 1915 09/17/21 0349 09/19/21 0450  NA 138 141 139  K 3.4* 3.4* 3.5  CL 92* 97* 93*  CO2 32 32 38*  GLUCOSE 176* 248* 196*  BUN 52* 48* 43*  CREATININE 2.10* 1.81* 1.83*  CALCIUM 8.3* 8.5* 8.9  MG  --  2.3  --   PHOS  --  3.5 2.7   Liver Function Tests: Recent Labs  Lab 09/17/21 0349 09/19/21 0450  AST 18  --   ALT 18  --   ALKPHOS 29*  --   BILITOT 0.6  --   PROT 5.9*  --   ALBUMIN 3.3* 3.2*   No results for input(s): LIPASE, AMYLASE in the last 168 hours. No results for input(s): AMMONIA in the last 168 hours. CBC: Recent Labs  Lab 09/16/21 1915 09/17/21 0349 09/19/21 0450  WBC 8.4 6.4 10.9*  NEUTROABS 7.5  --   --   HGB 11.0* 10.6* 11.5*  HCT 35.8* 34.8* 36.5*  MCV 102.0* 101.8* 98.9  PLT 192 172 183   Cardiac Enzymes: No results for input(s): CKTOTAL, CKMB, CKMBINDEX, TROPONINI in the last 168 hours. BNP: BNP (last 3 results) Recent Labs    07/02/21 2230 07/17/21 1226 08/08/21 1510  BNP 302.0* 638.0* 812.0*    ProBNP (last 3 results) No results for input(s): PROBNP in the last 8760 hours.  CBG: Recent Labs  Lab 09/19/21 0750 09/19/21 1103 09/19/21 1553 09/19/21 2145 09/20/21 0704  GLUCAP 137* 185* 207* 188* 171*    Signed:  Barton Dubois MD.  [facility] 09/20/2021, 9:28 AM

## 2021-09-20 NOTE — TOC Transition Note (Signed)
Transition of Care [facility]) - CM/SW Discharge Note   Patient Details  Name: Daniel Barker MRN: [id number] Date of Birth: 03/28/1946  [facility] CM/SW Contact:  Boneta Lucks, RN Phone Number: 09/20/2021, 10:22 AM   Clinical Narrative:   Patient discharging home. Patient states he is active with [facility]. New orders have been placed. Penni Homans updated with discharge plan.   Final next level of care: [place] Barriers to Discharge: Barriers Resolved   Patient Goals and CMS Choice Patient states their goals for this hospitalization and ongoing recovery are:: to go home. CMS Medicare.gov Compare Post Acute Care list provided to:: Patient    Discharge Placement            Patient and family notified of of transfer: 09/20/21  Discharge Plan and Services      Readmission Risk Interventions Readmission Risk Prevention Plan 09/20/2021 08/12/2021 08/09/2021  Transportation Screening Complete Complete Complete  PCP or Specialist Appt within 5-7 Days - - -  Home Care Screening - - -  Medication Review ([place]) Complete Complete Complete  PCP or Specialist appointment within 3-5 days of discharge Complete - -  [place] or Home Care Consult Complete Complete Complete  SW Recovery Care/Counseling Consult - Complete Complete  Palliative Care Screening - Not Applicable -  [place] - Not Applicable Not Applicable

## 2021-09-20 NOTE — Care Management Important Message (Signed)
Important Message  Patient Details  Name: Daniel Barker MRN: [id number] Date of Birth: May 04, 1946   Medicare Important Message Given:  Yes     [name] 09/20/2021, 3:58 PM

## 2021-09-22 DIAGNOSIS — I10 Essential (primary) hypertension: Secondary | ICD-10-CM | POA: Diagnosis not present

## 2021-09-22 DIAGNOSIS — R739 Hyperglycemia, unspecified: Secondary | ICD-10-CM | POA: Diagnosis not present

## 2021-09-22 DIAGNOSIS — E785 Hyperlipidemia, unspecified: Secondary | ICD-10-CM | POA: Diagnosis not present

## 2021-09-22 DIAGNOSIS — U099 Post covid-19 condition, unspecified: Secondary | ICD-10-CM | POA: Diagnosis not present

## 2021-09-22 DIAGNOSIS — J449 Chronic obstructive pulmonary disease, unspecified: Secondary | ICD-10-CM | POA: Diagnosis not present

## 2021-09-22 DIAGNOSIS — J9621 Acute and chronic respiratory failure with hypoxia: Secondary | ICD-10-CM | POA: Diagnosis not present

## 2021-09-22 DIAGNOSIS — I482 Chronic atrial fibrillation, unspecified: Secondary | ICD-10-CM | POA: Diagnosis not present

## 2021-09-22 DIAGNOSIS — I5032 Chronic diastolic (congestive) heart failure: Secondary | ICD-10-CM | POA: Diagnosis not present

## 2021-09-22 DIAGNOSIS — R001 Bradycardia, unspecified: Secondary | ICD-10-CM | POA: Diagnosis not present

## 2021-09-22 DIAGNOSIS — N1831 Chronic kidney disease, stage 3a: Secondary | ICD-10-CM | POA: Diagnosis not present

## 2021-09-23 DIAGNOSIS — U099 Post covid-19 condition, unspecified: Secondary | ICD-10-CM | POA: Diagnosis not present

## 2021-09-23 DIAGNOSIS — I5032 Chronic diastolic (congestive) heart failure: Secondary | ICD-10-CM | POA: Diagnosis not present

## 2021-09-23 DIAGNOSIS — E785 Hyperlipidemia, unspecified: Secondary | ICD-10-CM | POA: Diagnosis not present

## 2021-09-23 DIAGNOSIS — R001 Bradycardia, unspecified: Secondary | ICD-10-CM | POA: Diagnosis not present

## 2021-09-23 DIAGNOSIS — R739 Hyperglycemia, unspecified: Secondary | ICD-10-CM | POA: Diagnosis not present

## 2021-09-23 DIAGNOSIS — I10 Essential (primary) hypertension: Secondary | ICD-10-CM | POA: Diagnosis not present

## 2021-09-23 DIAGNOSIS — J9621 Acute and chronic respiratory failure with hypoxia: Secondary | ICD-10-CM | POA: Diagnosis not present

## 2021-09-23 DIAGNOSIS — J449 Chronic obstructive pulmonary disease, unspecified: Secondary | ICD-10-CM | POA: Diagnosis not present

## 2021-09-23 DIAGNOSIS — I482 Chronic atrial fibrillation, unspecified: Secondary | ICD-10-CM | POA: Diagnosis not present

## 2021-09-23 DIAGNOSIS — N1831 Chronic kidney disease, stage 3a: Secondary | ICD-10-CM | POA: Diagnosis not present

## 2021-09-23 IMAGING — DX DG CHEST 1V PORT
1 series · 1 of 1 positions shown · non-contrast
Comparison: 07/18/2021

CLINICAL DATA: Cough, short of breath

EXAM:
PORTABLE CHEST 1 VIEW

[chest ap]
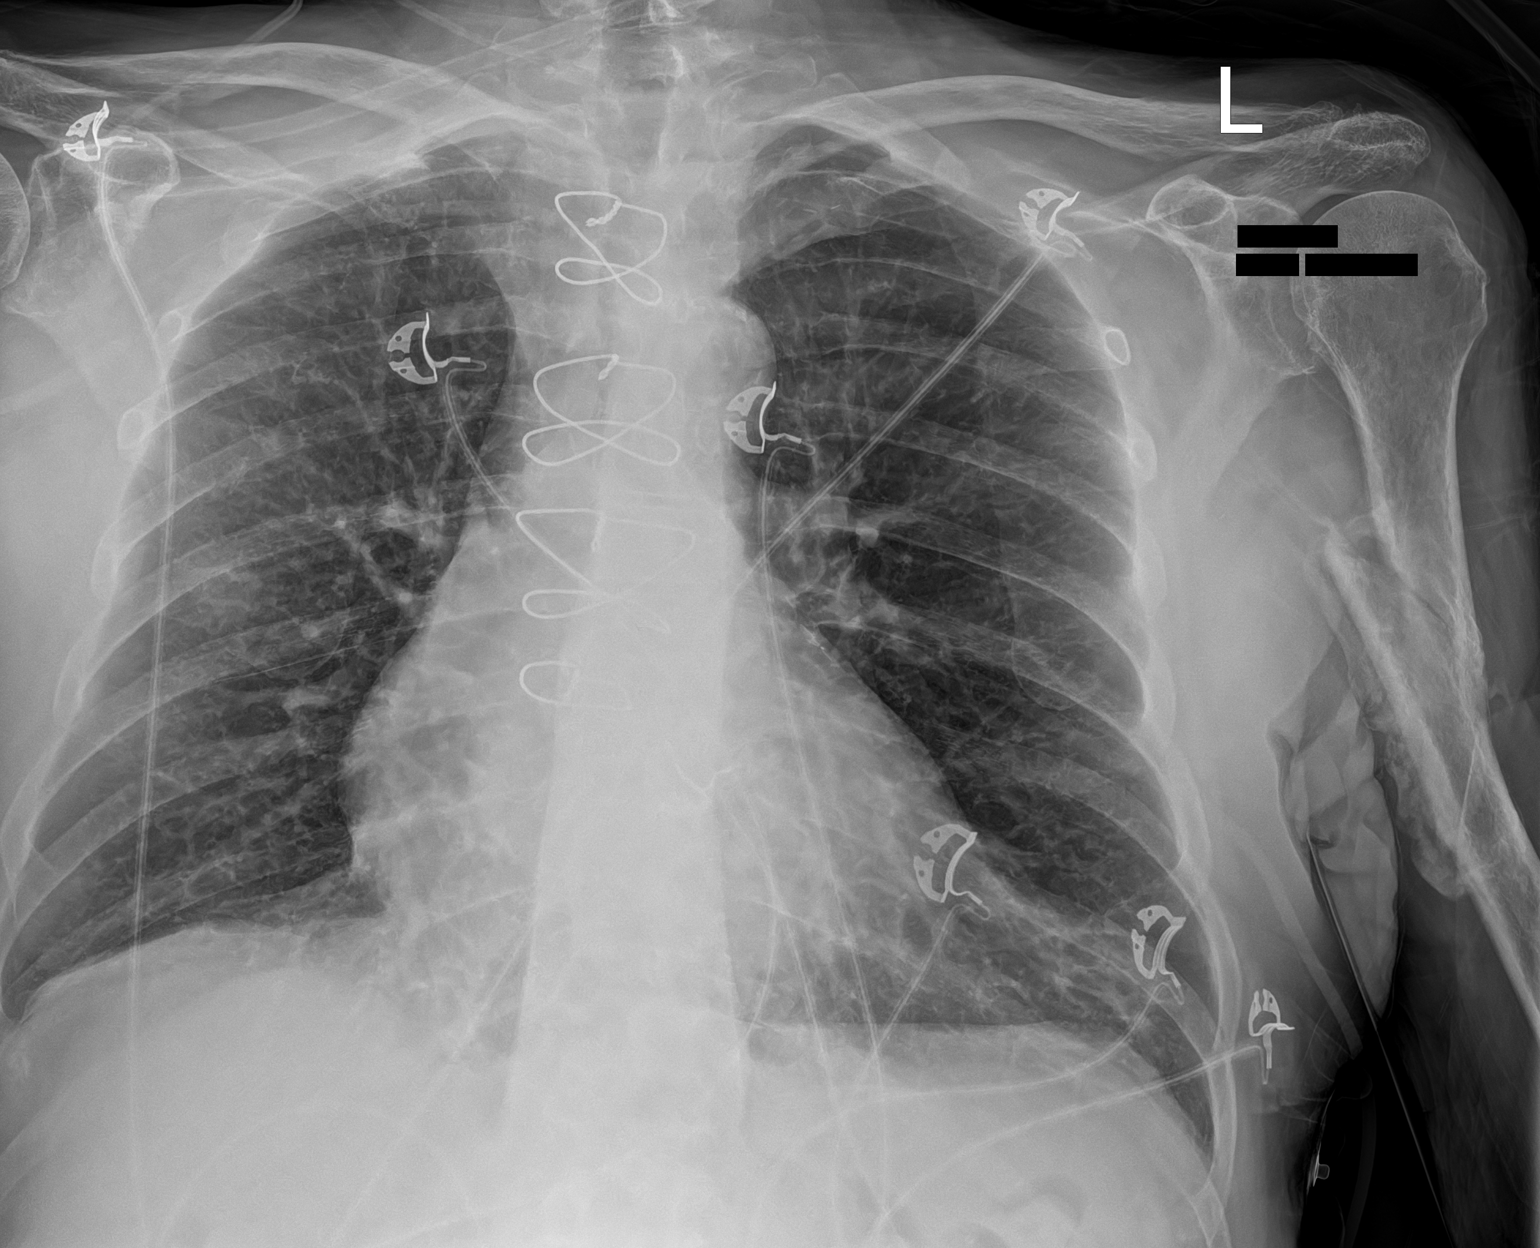

[1 of 1 positions shown; findings below may reference images not displayed]

FINDINGS: Single frontal view of the chest demonstrates stable postsurgical
changes from CABG. The cardiac silhouette is unremarkable. Chronic
scarring throughout the lungs without airspace disease, effusion, or
pneumothorax. No acute bony abnormalities. Chronic healing left
humeral fracture.
IMPRESSION: 1. Stable chest, no acute process.

## 2021-09-27 DIAGNOSIS — J449 Chronic obstructive pulmonary disease, unspecified: Secondary | ICD-10-CM | POA: Diagnosis not present

## 2021-09-27 DIAGNOSIS — I10 Essential (primary) hypertension: Secondary | ICD-10-CM | POA: Diagnosis not present

## 2021-09-27 DIAGNOSIS — N1831 Chronic kidney disease, stage 3a: Secondary | ICD-10-CM | POA: Diagnosis not present

## 2021-09-27 DIAGNOSIS — I482 Chronic atrial fibrillation, unspecified: Secondary | ICD-10-CM | POA: Diagnosis not present

## 2021-09-27 DIAGNOSIS — R001 Bradycardia, unspecified: Secondary | ICD-10-CM | POA: Diagnosis not present

## 2021-09-27 DIAGNOSIS — E785 Hyperlipidemia, unspecified: Secondary | ICD-10-CM | POA: Diagnosis not present

## 2021-09-27 DIAGNOSIS — R739 Hyperglycemia, unspecified: Secondary | ICD-10-CM | POA: Diagnosis not present

## 2021-09-27 DIAGNOSIS — I5032 Chronic diastolic (congestive) heart failure: Secondary | ICD-10-CM | POA: Diagnosis not present

## 2021-09-27 DIAGNOSIS — J9621 Acute and chronic respiratory failure with hypoxia: Secondary | ICD-10-CM | POA: Diagnosis not present

## 2021-09-27 DIAGNOSIS — U099 Post covid-19 condition, unspecified: Secondary | ICD-10-CM | POA: Diagnosis not present

## 2021-09-28 DIAGNOSIS — J9621 Acute and chronic respiratory failure with hypoxia: Secondary | ICD-10-CM | POA: Diagnosis not present

## 2021-09-28 DIAGNOSIS — D649 Anemia, unspecified: Secondary | ICD-10-CM | POA: Diagnosis not present

## 2021-09-28 DIAGNOSIS — I480 Paroxysmal atrial fibrillation: Secondary | ICD-10-CM | POA: Diagnosis not present

## 2021-09-28 DIAGNOSIS — N1831 Chronic kidney disease, stage 3a: Secondary | ICD-10-CM | POA: Diagnosis not present

## 2021-09-28 DIAGNOSIS — J441 Chronic obstructive pulmonary disease with (acute) exacerbation: Secondary | ICD-10-CM | POA: Diagnosis not present

## 2021-09-28 DIAGNOSIS — Z7409 Other reduced mobility: Secondary | ICD-10-CM | POA: Diagnosis not present

## 2021-09-28 DIAGNOSIS — E876 Hypokalemia: Secondary | ICD-10-CM | POA: Diagnosis not present

## 2021-09-28 DIAGNOSIS — I5032 Chronic diastolic (congestive) heart failure: Secondary | ICD-10-CM | POA: Diagnosis not present

## 2021-09-30 DIAGNOSIS — I10 Essential (primary) hypertension: Secondary | ICD-10-CM | POA: Diagnosis not present

## 2021-09-30 DIAGNOSIS — I5032 Chronic diastolic (congestive) heart failure: Secondary | ICD-10-CM | POA: Diagnosis not present

## 2021-09-30 DIAGNOSIS — R001 Bradycardia, unspecified: Secondary | ICD-10-CM | POA: Diagnosis not present

## 2021-09-30 DIAGNOSIS — R739 Hyperglycemia, unspecified: Secondary | ICD-10-CM | POA: Diagnosis not present

## 2021-09-30 DIAGNOSIS — N1831 Chronic kidney disease, stage 3a: Secondary | ICD-10-CM | POA: Diagnosis not present

## 2021-09-30 DIAGNOSIS — I482 Chronic atrial fibrillation, unspecified: Secondary | ICD-10-CM | POA: Diagnosis not present

## 2021-09-30 DIAGNOSIS — U099 Post covid-19 condition, unspecified: Secondary | ICD-10-CM | POA: Diagnosis not present

## 2021-09-30 DIAGNOSIS — J9621 Acute and chronic respiratory failure with hypoxia: Secondary | ICD-10-CM | POA: Diagnosis not present

## 2021-09-30 DIAGNOSIS — J449 Chronic obstructive pulmonary disease, unspecified: Secondary | ICD-10-CM | POA: Diagnosis not present

## 2021-09-30 DIAGNOSIS — E785 Hyperlipidemia, unspecified: Secondary | ICD-10-CM | POA: Diagnosis not present

## 2021-10-01 DIAGNOSIS — R739 Hyperglycemia, unspecified: Secondary | ICD-10-CM | POA: Diagnosis not present

## 2021-10-01 DIAGNOSIS — I10 Essential (primary) hypertension: Secondary | ICD-10-CM | POA: Diagnosis not present

## 2021-10-01 DIAGNOSIS — U099 Post covid-19 condition, unspecified: Secondary | ICD-10-CM | POA: Diagnosis not present

## 2021-10-01 DIAGNOSIS — J9621 Acute and chronic respiratory failure with hypoxia: Secondary | ICD-10-CM | POA: Diagnosis not present

## 2021-10-01 DIAGNOSIS — I482 Chronic atrial fibrillation, unspecified: Secondary | ICD-10-CM | POA: Diagnosis not present

## 2021-10-01 DIAGNOSIS — E785 Hyperlipidemia, unspecified: Secondary | ICD-10-CM | POA: Diagnosis not present

## 2021-10-01 DIAGNOSIS — I5032 Chronic diastolic (congestive) heart failure: Secondary | ICD-10-CM | POA: Diagnosis not present

## 2021-10-01 DIAGNOSIS — R001 Bradycardia, unspecified: Secondary | ICD-10-CM | POA: Diagnosis not present

## 2021-10-01 DIAGNOSIS — N1831 Chronic kidney disease, stage 3a: Secondary | ICD-10-CM | POA: Diagnosis not present

## 2021-10-01 DIAGNOSIS — J449 Chronic obstructive pulmonary disease, unspecified: Secondary | ICD-10-CM | POA: Diagnosis not present

## 2021-10-04 DIAGNOSIS — R739 Hyperglycemia, unspecified: Secondary | ICD-10-CM | POA: Diagnosis not present

## 2021-10-04 DIAGNOSIS — I10 Essential (primary) hypertension: Secondary | ICD-10-CM | POA: Diagnosis not present

## 2021-10-04 DIAGNOSIS — Z515 Encounter for palliative care: Secondary | ICD-10-CM | POA: Diagnosis not present

## 2021-10-04 DIAGNOSIS — J9621 Acute and chronic respiratory failure with hypoxia: Secondary | ICD-10-CM | POA: Diagnosis not present

## 2021-10-04 DIAGNOSIS — I482 Chronic atrial fibrillation, unspecified: Secondary | ICD-10-CM | POA: Diagnosis not present

## 2021-10-04 DIAGNOSIS — J449 Chronic obstructive pulmonary disease, unspecified: Secondary | ICD-10-CM | POA: Diagnosis not present

## 2021-10-04 DIAGNOSIS — U099 Post covid-19 condition, unspecified: Secondary | ICD-10-CM | POA: Diagnosis not present

## 2021-10-04 DIAGNOSIS — R001 Bradycardia, unspecified: Secondary | ICD-10-CM | POA: Diagnosis not present

## 2021-10-04 DIAGNOSIS — E785 Hyperlipidemia, unspecified: Secondary | ICD-10-CM | POA: Diagnosis not present

## 2021-10-04 DIAGNOSIS — N1831 Chronic kidney disease, stage 3a: Secondary | ICD-10-CM | POA: Diagnosis not present

## 2021-10-04 DIAGNOSIS — I5032 Chronic diastolic (congestive) heart failure: Secondary | ICD-10-CM | POA: Diagnosis not present

## 2021-10-06 DIAGNOSIS — I10 Essential (primary) hypertension: Secondary | ICD-10-CM | POA: Diagnosis not present

## 2021-10-06 DIAGNOSIS — J449 Chronic obstructive pulmonary disease, unspecified: Secondary | ICD-10-CM | POA: Diagnosis not present

## 2021-10-06 DIAGNOSIS — E785 Hyperlipidemia, unspecified: Secondary | ICD-10-CM | POA: Diagnosis not present

## 2021-10-06 DIAGNOSIS — I482 Chronic atrial fibrillation, unspecified: Secondary | ICD-10-CM | POA: Diagnosis not present

## 2021-10-06 DIAGNOSIS — U099 Post covid-19 condition, unspecified: Secondary | ICD-10-CM | POA: Diagnosis not present

## 2021-10-06 DIAGNOSIS — N1831 Chronic kidney disease, stage 3a: Secondary | ICD-10-CM | POA: Diagnosis not present

## 2021-10-06 DIAGNOSIS — I5032 Chronic diastolic (congestive) heart failure: Secondary | ICD-10-CM | POA: Diagnosis not present

## 2021-10-06 DIAGNOSIS — R001 Bradycardia, unspecified: Secondary | ICD-10-CM | POA: Diagnosis not present

## 2021-10-06 DIAGNOSIS — J9621 Acute and chronic respiratory failure with hypoxia: Secondary | ICD-10-CM | POA: Diagnosis not present

## 2021-10-06 DIAGNOSIS — R739 Hyperglycemia, unspecified: Secondary | ICD-10-CM | POA: Diagnosis not present

## 2021-10-13 DIAGNOSIS — R001 Bradycardia, unspecified: Secondary | ICD-10-CM | POA: Diagnosis not present

## 2021-10-13 DIAGNOSIS — U099 Post covid-19 condition, unspecified: Secondary | ICD-10-CM | POA: Diagnosis not present

## 2021-10-13 DIAGNOSIS — I5032 Chronic diastolic (congestive) heart failure: Secondary | ICD-10-CM | POA: Diagnosis not present

## 2021-10-13 DIAGNOSIS — N1831 Chronic kidney disease, stage 3a: Secondary | ICD-10-CM | POA: Diagnosis not present

## 2021-10-13 DIAGNOSIS — R739 Hyperglycemia, unspecified: Secondary | ICD-10-CM | POA: Diagnosis not present

## 2021-10-13 DIAGNOSIS — J449 Chronic obstructive pulmonary disease, unspecified: Secondary | ICD-10-CM | POA: Diagnosis not present

## 2021-10-13 DIAGNOSIS — I10 Essential (primary) hypertension: Secondary | ICD-10-CM | POA: Diagnosis not present

## 2021-10-13 DIAGNOSIS — J9621 Acute and chronic respiratory failure with hypoxia: Secondary | ICD-10-CM | POA: Diagnosis not present

## 2021-10-13 DIAGNOSIS — E785 Hyperlipidemia, unspecified: Secondary | ICD-10-CM | POA: Diagnosis not present

## 2021-10-13 DIAGNOSIS — I482 Chronic atrial fibrillation, unspecified: Secondary | ICD-10-CM | POA: Diagnosis not present

## 2021-10-18 DIAGNOSIS — R739 Hyperglycemia, unspecified: Secondary | ICD-10-CM | POA: Diagnosis not present

## 2021-10-18 DIAGNOSIS — I482 Chronic atrial fibrillation, unspecified: Secondary | ICD-10-CM | POA: Diagnosis not present

## 2021-10-18 DIAGNOSIS — I5032 Chronic diastolic (congestive) heart failure: Secondary | ICD-10-CM | POA: Diagnosis not present

## 2021-10-18 DIAGNOSIS — I5033 Acute on chronic diastolic (congestive) heart failure: Secondary | ICD-10-CM | POA: Diagnosis not present

## 2021-10-18 DIAGNOSIS — J9621 Acute and chronic respiratory failure with hypoxia: Secondary | ICD-10-CM | POA: Diagnosis not present

## 2021-10-18 DIAGNOSIS — J449 Chronic obstructive pulmonary disease, unspecified: Secondary | ICD-10-CM | POA: Diagnosis not present

## 2021-10-18 DIAGNOSIS — I130 Hypertensive heart and chronic kidney disease with heart failure and stage 1 through stage 4 chronic kidney disease, or unspecified chronic kidney disease: Secondary | ICD-10-CM | POA: Diagnosis not present

## 2021-10-18 DIAGNOSIS — N1832 Chronic kidney disease, stage 3b: Secondary | ICD-10-CM | POA: Diagnosis not present

## 2021-10-18 DIAGNOSIS — E785 Hyperlipidemia, unspecified: Secondary | ICD-10-CM | POA: Diagnosis not present

## 2021-10-18 DIAGNOSIS — I10 Essential (primary) hypertension: Secondary | ICD-10-CM | POA: Diagnosis not present

## 2021-10-18 DIAGNOSIS — R001 Bradycardia, unspecified: Secondary | ICD-10-CM | POA: Diagnosis not present

## 2021-10-18 DIAGNOSIS — U099 Post covid-19 condition, unspecified: Secondary | ICD-10-CM | POA: Diagnosis not present

## 2021-10-18 DIAGNOSIS — N1831 Chronic kidney disease, stage 3a: Secondary | ICD-10-CM | POA: Diagnosis not present

## 2021-10-20 ENCOUNTER — Ambulatory Visit: Payer: Medicare Other | Admitting: Internal Medicine

## 2021-10-20 DIAGNOSIS — U099 Post covid-19 condition, unspecified: Secondary | ICD-10-CM | POA: Diagnosis not present

## 2021-10-20 DIAGNOSIS — I10 Essential (primary) hypertension: Secondary | ICD-10-CM | POA: Diagnosis not present

## 2021-10-20 DIAGNOSIS — J9621 Acute and chronic respiratory failure with hypoxia: Secondary | ICD-10-CM | POA: Diagnosis not present

## 2021-10-20 DIAGNOSIS — N1831 Chronic kidney disease, stage 3a: Secondary | ICD-10-CM | POA: Diagnosis not present

## 2021-10-20 DIAGNOSIS — E785 Hyperlipidemia, unspecified: Secondary | ICD-10-CM | POA: Diagnosis not present

## 2021-10-20 DIAGNOSIS — I482 Chronic atrial fibrillation, unspecified: Secondary | ICD-10-CM | POA: Diagnosis not present

## 2021-10-20 DIAGNOSIS — R001 Bradycardia, unspecified: Secondary | ICD-10-CM | POA: Diagnosis not present

## 2021-10-20 DIAGNOSIS — I5032 Chronic diastolic (congestive) heart failure: Secondary | ICD-10-CM | POA: Diagnosis not present

## 2021-10-20 DIAGNOSIS — R739 Hyperglycemia, unspecified: Secondary | ICD-10-CM | POA: Diagnosis not present

## 2021-10-20 DIAGNOSIS — J449 Chronic obstructive pulmonary disease, unspecified: Secondary | ICD-10-CM | POA: Diagnosis not present

## 2021-10-21 DIAGNOSIS — J449 Chronic obstructive pulmonary disease, unspecified: Secondary | ICD-10-CM | POA: Diagnosis not present

## 2021-10-25 ENCOUNTER — Other Ambulatory Visit: Payer: Self-pay | Admitting: *Deleted

## 2021-10-25 ENCOUNTER — Other Ambulatory Visit: Payer: Self-pay

## 2021-10-25 NOTE — Patient Outreach (Signed)
[facility]) Care Management  10/25/2021  Yossi Hinchman 1946-02-06 [id number]  [facility] outreach to post hospital patient with case closure   Recent admission 09/17/21-09/20/21 with discharge home with [facility] services Tightness in chest and stomach  Missed last pcp appointment Need new medicare card  Daniel Barker does not listen per Mr Vroom  To get a new walker  Called place in town but no take his insurance  Home health nurse PT visits 1-2 weekly [facility]  Discussed warm transfer to pcp external program He reports no outreach at this time and a pending outreach to re schedule an appointment he missed related to not having transportation  RN CM reminded him of his united health care transportation benefit to assist with getting to appointments vs missing appointments  Conference call to [employer] for new medicare card [phone] Spoke with Timmy to complete    Plans Case closure- external care management services at pcp office Pt to call back prn until external care management services outreach  Letters to patient and pcp   Joelene Millin L. Lavina Hamman, RN, BSN, [place] Coordinator Office number [phone] [facility] number [phone] Fax number [phone]

## 2021-10-26 DIAGNOSIS — R001 Bradycardia, unspecified: Secondary | ICD-10-CM | POA: Diagnosis not present

## 2021-10-26 DIAGNOSIS — I5032 Chronic diastolic (congestive) heart failure: Secondary | ICD-10-CM | POA: Diagnosis not present

## 2021-10-26 DIAGNOSIS — J449 Chronic obstructive pulmonary disease, unspecified: Secondary | ICD-10-CM | POA: Diagnosis not present

## 2021-10-26 DIAGNOSIS — E785 Hyperlipidemia, unspecified: Secondary | ICD-10-CM | POA: Diagnosis not present

## 2021-10-26 DIAGNOSIS — N1831 Chronic kidney disease, stage 3a: Secondary | ICD-10-CM | POA: Diagnosis not present

## 2021-10-26 DIAGNOSIS — U099 Post covid-19 condition, unspecified: Secondary | ICD-10-CM | POA: Diagnosis not present

## 2021-10-26 DIAGNOSIS — I10 Essential (primary) hypertension: Secondary | ICD-10-CM | POA: Diagnosis not present

## 2021-10-26 DIAGNOSIS — J9621 Acute and chronic respiratory failure with hypoxia: Secondary | ICD-10-CM | POA: Diagnosis not present

## 2021-10-26 DIAGNOSIS — R739 Hyperglycemia, unspecified: Secondary | ICD-10-CM | POA: Diagnosis not present

## 2021-10-26 DIAGNOSIS — I482 Chronic atrial fibrillation, unspecified: Secondary | ICD-10-CM | POA: Diagnosis not present

## 2021-10-27 ENCOUNTER — Emergency Department (HOSPITAL_COMMUNITY): Payer: Medicare Other

## 2021-10-27 ENCOUNTER — Inpatient Hospital Stay (HOSPITAL_COMMUNITY)
Admission: EM | Admit: 2021-10-27 | Discharge: 2021-10-30 | DRG: 291 | Disposition: A | Discharge status: Home Health | Payer: Medicare Other | Attending: Family Medicine | Admitting: Family Medicine

## 2021-10-27 ENCOUNTER — Encounter (HOSPITAL_COMMUNITY): Payer: Self-pay | Admitting: *Deleted

## 2021-10-27 ENCOUNTER — Other Ambulatory Visit: Payer: Self-pay

## 2021-10-27 DIAGNOSIS — I482 Chronic atrial fibrillation, unspecified: Secondary | ICD-10-CM | POA: Diagnosis present

## 2021-10-27 DIAGNOSIS — I130 Hypertensive heart and chronic kidney disease with heart failure and stage 1 through stage 4 chronic kidney disease, or unspecified chronic kidney disease: Secondary | ICD-10-CM | POA: Diagnosis not present

## 2021-10-27 DIAGNOSIS — J9622 Acute and chronic respiratory failure with hypercapnia: Secondary | ICD-10-CM | POA: Diagnosis not present

## 2021-10-27 DIAGNOSIS — N1831 Chronic kidney disease, stage 3a: Secondary | ICD-10-CM | POA: Diagnosis present

## 2021-10-27 DIAGNOSIS — T380X5A Adverse effect of glucocorticoids and synthetic analogues, initial encounter: Secondary | ICD-10-CM | POA: Diagnosis present

## 2021-10-27 DIAGNOSIS — I2510 Atherosclerotic heart disease of native coronary artery without angina pectoris: Secondary | ICD-10-CM | POA: Diagnosis present

## 2021-10-27 DIAGNOSIS — J439 Emphysema, unspecified: Secondary | ICD-10-CM | POA: Diagnosis not present

## 2021-10-27 DIAGNOSIS — Z7901 Long term (current) use of anticoagulants: Secondary | ICD-10-CM

## 2021-10-27 DIAGNOSIS — Z951 Presence of aortocoronary bypass graft: Secondary | ICD-10-CM | POA: Diagnosis not present

## 2021-10-27 DIAGNOSIS — E785 Hyperlipidemia, unspecified: Secondary | ICD-10-CM | POA: Diagnosis not present

## 2021-10-27 DIAGNOSIS — J9621 Acute and chronic respiratory failure with hypoxia: Secondary | ICD-10-CM | POA: Diagnosis not present

## 2021-10-27 DIAGNOSIS — J441 Chronic obstructive pulmonary disease with (acute) exacerbation: Secondary | ICD-10-CM | POA: Diagnosis not present

## 2021-10-27 DIAGNOSIS — E876 Hypokalemia: Secondary | ICD-10-CM | POA: Diagnosis not present

## 2021-10-27 DIAGNOSIS — J9611 Chronic respiratory failure with hypoxia: Secondary | ICD-10-CM | POA: Diagnosis not present

## 2021-10-27 DIAGNOSIS — I5033 Acute on chronic diastolic (congestive) heart failure: Secondary | ICD-10-CM | POA: Diagnosis not present

## 2021-10-27 DIAGNOSIS — I509 Heart failure, unspecified: Secondary | ICD-10-CM | POA: Insufficient documentation

## 2021-10-27 DIAGNOSIS — I10 Essential (primary) hypertension: Secondary | ICD-10-CM | POA: Diagnosis not present

## 2021-10-27 DIAGNOSIS — Z23 Encounter for immunization: Secondary | ICD-10-CM

## 2021-10-27 DIAGNOSIS — Z832 Family history of diseases of the blood and blood-forming organs and certain disorders involving the immune mechanism: Secondary | ICD-10-CM | POA: Diagnosis not present

## 2021-10-27 DIAGNOSIS — E559 Vitamin D deficiency, unspecified: Secondary | ICD-10-CM | POA: Diagnosis present

## 2021-10-27 DIAGNOSIS — I110 Hypertensive heart disease with heart failure: Secondary | ICD-10-CM | POA: Diagnosis not present

## 2021-10-27 DIAGNOSIS — I5031 Acute diastolic (congestive) heart failure: Secondary | ICD-10-CM | POA: Diagnosis not present

## 2021-10-27 DIAGNOSIS — J9602 Acute respiratory failure with hypercapnia: Secondary | ICD-10-CM

## 2021-10-27 DIAGNOSIS — Z7951 Long term (current) use of inhaled steroids: Secondary | ICD-10-CM | POA: Diagnosis not present

## 2021-10-27 DIAGNOSIS — D539 Nutritional anemia, unspecified: Secondary | ICD-10-CM | POA: Diagnosis not present

## 2021-10-27 DIAGNOSIS — Z8616 Personal history of COVID-19: Secondary | ICD-10-CM

## 2021-10-27 DIAGNOSIS — I5030 Unspecified diastolic (congestive) heart failure: Secondary | ICD-10-CM | POA: Diagnosis present

## 2021-10-27 DIAGNOSIS — Z7902 Long term (current) use of antithrombotics/antiplatelets: Secondary | ICD-10-CM | POA: Diagnosis not present

## 2021-10-27 DIAGNOSIS — E1122 Type 2 diabetes mellitus with diabetic chronic kidney disease: Secondary | ICD-10-CM | POA: Diagnosis not present

## 2021-10-27 DIAGNOSIS — Z8249 Family history of ischemic heart disease and other diseases of the circulatory system: Secondary | ICD-10-CM | POA: Diagnosis not present

## 2021-10-27 DIAGNOSIS — D631 Anemia in chronic kidney disease: Secondary | ICD-10-CM | POA: Diagnosis present

## 2021-10-27 DIAGNOSIS — N179 Acute kidney failure, unspecified: Secondary | ICD-10-CM

## 2021-10-27 DIAGNOSIS — Z87891 Personal history of nicotine dependence: Secondary | ICD-10-CM

## 2021-10-27 DIAGNOSIS — I5032 Chronic diastolic (congestive) heart failure: Secondary | ICD-10-CM | POA: Diagnosis not present

## 2021-10-27 DIAGNOSIS — Z79899 Other long term (current) drug therapy: Secondary | ICD-10-CM

## 2021-10-27 DIAGNOSIS — E1165 Type 2 diabetes mellitus with hyperglycemia: Secondary | ICD-10-CM | POA: Diagnosis not present

## 2021-10-27 DIAGNOSIS — K219 Gastro-esophageal reflux disease without esophagitis: Secondary | ICD-10-CM | POA: Diagnosis present

## 2021-10-27 DIAGNOSIS — N400 Enlarged prostate without lower urinary tract symptoms: Secondary | ICD-10-CM | POA: Diagnosis present

## 2021-10-27 DIAGNOSIS — E7800 Pure hypercholesterolemia, unspecified: Secondary | ICD-10-CM | POA: Diagnosis present

## 2021-10-27 DIAGNOSIS — J449 Chronic obstructive pulmonary disease, unspecified: Secondary | ICD-10-CM

## 2021-10-27 DIAGNOSIS — N189 Chronic kidney disease, unspecified: Secondary | ICD-10-CM

## 2021-10-27 DIAGNOSIS — T502X5A Adverse effect of carbonic-anhydrase inhibitors, benzothiadiazides and other diuretics, initial encounter: Secondary | ICD-10-CM | POA: Diagnosis not present

## 2021-10-27 DIAGNOSIS — Z825 Family history of asthma and other chronic lower respiratory diseases: Secondary | ICD-10-CM

## 2021-10-27 DIAGNOSIS — R739 Hyperglycemia, unspecified: Secondary | ICD-10-CM | POA: Diagnosis not present

## 2021-10-27 DIAGNOSIS — J9601 Acute respiratory failure with hypoxia: Secondary | ICD-10-CM | POA: Diagnosis not present

## 2021-10-27 DIAGNOSIS — U099 Post covid-19 condition, unspecified: Secondary | ICD-10-CM | POA: Diagnosis not present

## 2021-10-27 DIAGNOSIS — E782 Mixed hyperlipidemia: Secondary | ICD-10-CM | POA: Diagnosis not present

## 2021-10-27 DIAGNOSIS — M7989 Other specified soft tissue disorders: Secondary | ICD-10-CM | POA: Diagnosis not present

## 2021-10-27 DIAGNOSIS — R001 Bradycardia, unspecified: Secondary | ICD-10-CM | POA: Diagnosis not present

## 2021-10-27 DIAGNOSIS — Z7952 Long term (current) use of systemic steroids: Secondary | ICD-10-CM

## 2021-10-27 DIAGNOSIS — Z955 Presence of coronary angioplasty implant and graft: Secondary | ICD-10-CM

## 2021-10-27 DIAGNOSIS — D72829 Elevated white blood cell count, unspecified: Secondary | ICD-10-CM | POA: Diagnosis present

## 2021-10-27 DIAGNOSIS — Z9981 Dependence on supplemental oxygen: Secondary | ICD-10-CM

## 2021-10-27 LAB — COMPREHENSIVE METABOLIC PANEL
ALT: 19 U/L (ref 0–44)
AST: 15 U/L (ref 15–41)
Albumin: 3.7 g/dL (ref 3.5–5.0)
Alkaline Phosphatase: 29 U/L — ABNORMAL LOW (ref 38–126)
Anion gap: 7 (ref 5–15)
BUN: 61 mg/dL — ABNORMAL HIGH (ref 8–23)
CO2: 33 mmol/L — ABNORMAL HIGH (ref 22–32)
Calcium: 9.3 mg/dL (ref 8.9–10.3)
Chloride: 101 mmol/L (ref 98–111)
Creatinine, Ser: 2.32 mg/dL — ABNORMAL HIGH (ref 0.61–1.24)
GFR, Estimated: 29 mL/min — ABNORMAL LOW (ref >60)
Glucose, Bld: 135 mg/dL — ABNORMAL HIGH (ref 70–99)
Potassium: 3.8 mmol/L (ref 3.5–5.1)
Sodium: 141 mmol/L (ref 135–145)
Total Bilirubin: 0.4 mg/dL (ref 0.3–1.2)
Total Protein: 6.1 g/dL — ABNORMAL LOW (ref 6.5–8.1)

## 2021-10-27 LAB — CBC WITH DIFFERENTIAL/PLATELET
Abs Immature Granulocytes: 0.23 10*3/uL — ABNORMAL HIGH (ref 0.00–0.07)
Basophils Absolute: 0.1 10*3/uL (ref 0.0–0.1)
Basophils Relative: 1 %
Eosinophils Absolute: 0 10*3/uL (ref 0.0–0.5)
Eosinophils Relative: 0 %
HCT: 35.7 % — ABNORMAL LOW (ref 39.0–52.0)
Hemoglobin: 10.4 g/dL — ABNORMAL LOW (ref 13.0–17.0)
Immature Granulocytes: 2 %
Lymphocytes Relative: 10 %
Lymphs Abs: 1.1 10*3/uL (ref 0.7–4.0)
MCH: 30.2 pg (ref 26.0–34.0)
MCHC: 29.1 g/dL — ABNORMAL LOW (ref 30.0–36.0)
MCV: 103.8 fL — ABNORMAL HIGH (ref 80.0–100.0)
Monocytes Absolute: 0.6 10*3/uL (ref 0.1–1.0)
Monocytes Relative: 5 %
Neutro Abs: 9.2 10*3/uL — ABNORMAL HIGH (ref 1.7–7.7)
Neutrophils Relative %: 82 %
Platelets: 197 10*3/uL (ref 150–400)
RBC: 3.44 MIL/uL — ABNORMAL LOW (ref 4.22–5.81)
RDW: 14.4 % (ref 11.5–15.5)
WBC: 11.3 10*3/uL — ABNORMAL HIGH (ref 4.0–10.5)
nRBC: 0.2 % (ref 0.0–0.2)

## 2021-10-27 LAB — RESP PANEL BY RT-PCR (FLU A&B, COVID) ARPGX2
Influenza A by PCR: NEGATIVE
Influenza B by PCR: NEGATIVE
SARS Coronavirus 2 by RT PCR: NEGATIVE

## 2021-10-27 LAB — TROPONIN I (HIGH SENSITIVITY)
Troponin I (High Sensitivity): 10 ng/L (ref <18)
Troponin I (High Sensitivity): 12 ng/L (ref <18)

## 2021-10-27 LAB — MAGNESIUM: Magnesium: 2.5 mg/dL — ABNORMAL HIGH (ref 1.7–2.4)

## 2021-10-27 LAB — BRAIN NATRIURETIC PEPTIDE: B Natriuretic Peptide: 497 pg/mL — ABNORMAL HIGH (ref 0.0–100.0)

## 2021-10-27 MED ORDER — ALBUTEROL SULFATE HFA 108 (90 BASE) MCG/ACT IN AERS: 2.0000 | INHALATION_SPRAY | RESPIRATORY_TRACT | Status: DC | PRN

## 2021-10-27 MED ORDER — ALBUTEROL SULFATE (2.5 MG/3ML) 0.083% IN NEBU: 2.5000 mg | INHALATION_SOLUTION | RESPIRATORY_TRACT | Status: DC | PRN

## 2021-10-27 MED ORDER — FUROSEMIDE 10 MG/ML IJ SOLN
40.0000 mg | Freq: Once | INTRAMUSCULAR | Status: AC
  Administered 2021-10-27: 40 mg via INTRAVENOUS
  Filled 2021-10-27: qty 4

## 2021-10-27 MED ORDER — METHYLPREDNISOLONE SODIUM SUCC 125 MG IJ SOLR
125.0000 mg | Freq: Once | INTRAMUSCULAR | Status: AC
  Administered 2021-10-27: 125 mg via INTRAVENOUS
  Filled 2021-10-27: qty 2

## 2021-10-27 NOTE — H&P (Signed)
History and Physical  Daniel Barker CBJ:[id number] DOB: May 18, 1946 DOA: 10/27/2021  Referring physician: Hayden Rasmussen, MD  PCP: [name], MD  Patient coming from: Home  Chief Complaint: Leg swelling  HPI: Daniel Barker is a 75 y.o. 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  He went to see his PCP would be for lab work and he was called yesterday and was asked to go to the ED for further evaluation and management due to worsening kidney function.  He denies chest pain, fever, chills, nausea, vomiting  ED Course:  In the emergency department, he was bradycardic and BP was soft at 103/38.  Work-up in the ED showed leukocytosis, macrocytic anemia, BNP 497, magnesium 2.5, BUN/creatinine 61/2.32 (baseline creatinine 1.3-1.8), hyperglycemia. Chest x-ray showed no radiographic evidence of acute cardiopulmonary disease. IV Lasix 40 mg x 1 and Solu-Medrol 125 were given.  Hospitalist was asked to admit patient for further evaluation and management.  Review of Systems: Constitutional: Negative for chills and fever.  HENT: Negative for ear pain and sore throat.   Eyes: Negative for pain and visual disturbance.  Respiratory: Positive for shortness of breath.  Negative for chest tightness  Cardiovascular: Positive for leg swelling.  Negative for chest pain and palpitations.  Gastrointestinal: Negative for abdominal pain and vomiting.  Endocrine: Negative for polyphagia and polyuria.  Genitourinary: Negative for decreased urine volume, dysuria,  enuresis Musculoskeletal: Negative for arthralgias and back pain.  Skin: Negative for color change and rash.  Allergic/Immunologic: Negative for immunocompromised state.  Neurological: Negative for tremors, syncope, speech difficulty Hematological: Does not bruise/bleed easily.  All other systems reviewed and are negative  Past Medical History:  Diagnosis Date   Acute metabolic encephalopathy 1/60/7371   Asthma    Atrial fibrillation (HCC)    BPH (benign prostatic hyperplasia)    Cigarette smoker    CKD (chronic kidney disease)    COPD (chronic obstructive pulmonary disease) (HCC)    Coronary artery disease    Cough    High cholesterol    Hx of CABG    Hypercholesteremia    Hypertension    Localized edema    Stented coronary artery    Past Surgical History:  Procedure Laterality Date   CARDIAC SURGERY     CORONARY ARTERY BYPASS GRAFT      Social History:  reports that he quit smoking about 13 months ago. His smoking use included cigarettes. He has a 60.00 pack-year smoking history. He has never used smokeless tobacco. He reports that he does not drink alcohol and does not use drugs.   Allergies  Allergen Reactions   Lipitor [Atorvastatin]     Unknown reaction    Family History  Problem Relation Age of Onset   Hypertension Mother    Clotting disorder Mother    COPD Father      Prior to Admission medications   Medication Sig Start Date End Date Taking? Authorizing Provider  albuterol (PROVENTIL) (2.5 MG/3ML) 0.083% nebulizer solution INHALE CONTENTS OF 1 VIAL IN NEBULIZER EVERY 6 HOURS AS NEEDED FOR WHEEZING/SHORTNESS OF BREATH Patient taking differently: Take 2.5  mg by nebulization every 6 (six) hours as needed for shortness of breath or wheezing. 09/13/21  Yes Tanda Rockers, MD  albuterol (VENTOLIN HFA) 108 (90 Base) MCG/ACT inhaler INHALE 2 PUFFS BY MOUTH EVERY 4 HOURS AS NEEDED Patient taking differently: Inhale 1-2 puffs into the lungs every 4 (four) hours as  needed for wheezing or shortness of breath. 09/06/21  Yes Tanda Rockers, MD  amLODipine (NORVASC) 5 MG tablet Take 5 mg by mouth daily. 06/04/21  Yes [provider]  bisoprolol (ZEBETA) 5 MG tablet Take 0.5 tablets (2.5 mg total) by mouth daily. HOLD FOR  HEART RATE LESS THAN 65 OR SBP LESS THAN 95 08/14/21  Yes Johnson, Clanford L, MD  acetaminophen (TYLENOL) 500 MG tablet Take 1,000 mg by mouth every 6 (six) hours as needed for mild pain.    [provider]  azithromycin (ZITHROMAX) 250 MG tablet Take 1 tablet (250 mg total) by mouth daily. 09/20/21   Barton Dubois, MD  budesonide-formoterol [facility]) 160-4.5 MCG/ACT inhaler Take 2 puffs first thing in am and then another 2 puffs about 12 hours later. Patient taking differently: Inhale 2 puffs into the lungs every 12 (twelve) hours. Take 2 puffs first thing in am and then another 2 puffs about 12 hours later. 10/27/20   Tanda Rockers, MD  clopidogrel (PLAVIX) 75 MG tablet Take 75 mg by mouth daily.    [provider]  dextromethorphan-guaiFENesin (MUCINEX DM) 30-600 MG 12hr tablet Take 1 tablet by mouth 2 (two) times daily. 09/20/21   Barton Dubois, MD  fenofibrate micronized (LOFIBRA) 134 MG capsule Take 134 mg by mouth daily before breakfast.    [provider]  fluticasone (FLONASE) 50 MCG/ACT nasal spray Place 2 sprays into both nostrils in the morning and at bedtime. Patient not taking: No sig reported 08/20/21   Tanda Rockers, MD  montelukast (SINGULAIR) 10 MG tablet Take 10 mg by mouth at bedtime.    [provider]  pantoprazole (PROTONIX) 40 MG tablet Take 1 tablet (40 mg total) by mouth 2 (two) times daily. 05/23/21   Barton Dubois, MD  potassium chloride SA (KLOR-CON) 20 MEQ tablet Take 1 tablet (20 mEq total) by mouth daily. 09/20/21   Barton Dubois, MD  predniSONE (DELTASONE) 10 MG tablet Take 1 tablet (10 mg total) by mouth daily. 09/20/21   Barton Dubois, MD  predniSONE  (DELTASONE) 10 MG tablet Take 5 tablets daily X 1 day; then 4 tablets daily X 2 days, then 3 tablets daily X 2 days, then 2 tablets daily X 4 days; and then resume 1 tab daily 09/20/21   Barton Dubois, MD  tamsulosin (FLOMAX) 0.4 MG CAPS capsule TAKE 1 CAPSULE(0.4 MG) BY MOUTH DAILY Patient taking differently: Take 0.4 mg by mouth daily. 06/30/21   McKenzie, Candee Furbish, MD  Tiotropium Bromide Monohydrate (SPIRIVA RESPIMAT) 2.5 MCG/ACT AERS Inhale 2 puffs into the lungs daily. 08/20/21   Tanda Rockers, MD  torsemide (DEMADEX) 20 MG tablet Take 1 tablet (20 mg total) by mouth daily. 09/20/21 10/20/21  Barton Dubois, MD  zinc sulfate 220 (50 Zn) MG capsule Take 1 capsule (220 mg total) by mouth daily. 05/24/21   Barton Dubois, MD    Physical Exam: BP 137/72   Pulse 61   Temp 97.9 F (36.6 C)   Resp 20   Ht 5' 6.5" (1.689 m)   Wt 77.6 kg   SpO2 100%   BMI 27.19 kg/m   General: 75  y.o. year-old male well developed well nourished in no acute distress.  Alert and oriented x3. HEENT: NCAT, EOMI Neck: Supple, trachea medial Cardiovascular: Regular rate and rhythm with no rubs or gallops.  No thyromegaly or JVD noted. +2 lower extremity edema bilaterally. 2/4 pulses in all 4 extremities. Respiratory: Scattered wheezing on auscultation bilaterally.   Abdomen: Soft, nontender nondistended with normal bowel sounds x4 quadrants. Muskuloskeletal: No cyanosis or clubbing noted bilaterally Neuro: CN II-XII intact, strength 5/5 x 4, sensation, reflexes intact Skin: No ulcerative lesions noted or rashes Psychiatry: Judgement and insight appear normal. Mood is appropriate for condition and setting          Labs on Admission:  Basic Metabolic Panel: Recent Labs  Lab 10/27/21 1951 10/28/21 0353  NA 141 144  K 3.8 4.0  CL 101 100  CO2 33* 37*  GLUCOSE 135* 222*  BUN 61* 54*  CREATININE 2.32* 2.07*  CALCIUM 9.3 9.3  MG 2.5* 2.4  PHOS  --  3.5   Liver Function Tests: Recent Labs  Lab  10/27/21 1951 10/28/21 0353  AST 15 16  ALT 19 18  ALKPHOS 29* 28*  BILITOT 0.4 0.6  PROT 6.1* 6.1*  ALBUMIN 3.7 3.5   No results for input(s): LIPASE, AMYLASE in the last 168 hours. No results for input(s): AMMONIA in the last 168 hours. CBC: Recent Labs  Lab 10/27/21 1951 10/28/21 0353  WBC 11.3* 10.6*  NEUTROABS 9.2*  --   HGB 10.4* 10.8*  HCT 35.7* 35.3*  MCV 103.8* 102.3*  PLT 197 179   Cardiac Enzymes: No results for input(s): CKTOTAL, CKMB, CKMBINDEX, TROPONINI in the last 168 hours.  BNP (last 3 results) Recent Labs    07/17/21 1226 08/08/21 1510 10/27/21 1952  BNP 638.0* 812.0* 497.0*    ProBNP (last 3 results) No results for input(s): PROBNP in the last 8760 hours.  CBG: No results for input(s): GLUCAP in the last 168 hours.  Radiological Exams on Admission: DG Chest 2 View  Result Date: 10/27/2021 CLINICAL DATA:  75 year old male with history of shortness of breath. Bilateral leg swelling. EXAM: CHEST - 2 VIEW COMPARISON:  Chest x-ray 09/16/2021. FINDINGS: Lung volumes are increased with emphysematous changes. No consolidative airspace disease. No pleural effusions. No pneumothorax. No pulmonary nodule or mass noted. Pulmonary vasculature and the cardiomediastinal silhouette are within normal limits. Atherosclerosis in the thoracic aorta. Status post median sternotomy. IMPRESSION: 1. No radiographic evidence of acute cardiopulmonary disease. 2. Emphysema. 3. Aortic atherosclerosis. Electronically Signed   By: Vinnie Langton M.D.   On: 10/27/2021 19:57    EKG: I independently viewed the EKG done and my findings are as followed: Junctional rhythm at 61 beats per minute  Assessment/Plan Present on Admission:  Acute on chronic diastolic CHF (congestive heart failure) (HCC)  Chronic respiratory failure with hypoxia and hypercapnia (HCC)  Acute kidney injury superimposed on CKD ([facility])  Principal Problem:   Acute on chronic diastolic CHF (congestive heart  failure) (HCC) Active Problems:   COPD (chronic obstructive pulmonary disease) (HCC)   Chronic respiratory failure with hypoxia and hypercapnia (HCC)   Acute kidney injury superimposed on CKD (HCC)   Hypermagnesemia  Acute on chronic diastolic CHF Elevated BNP (497) Patient complained of worsening increased leg swelling and abdominal distention Echocardiogram done on 06/06/2021 showed LVEF of 60 to 65% and showed LV diastolic parameters consistent with grade 1 diastolic dysfunction Continue with IV Lasix 40 twice daily (as tolerated by BP) Patient was counseled on low-sodium  diet Echocardiogram in the morning Cardiology will be consulted and we shall await further recommendation   Chronic respiratory failure with hypoxia due to COPD Continue albuterol as needed, Mucinex, Robitussin, Singulair, Spiriva, prednisone Continue supplemental oxygen to maintain O2 sats > 92%  Acute kidney injury superimposed on CKD BUN/creatinine 61/2.32 (baseline creatinine 1.3-1.8) Renally adjust medications, avoid nephrotoxic agents/dehydration/hypotension  Hypermagnesemia Mg 2.5; continue to monitor  Hyperglycemia-steroid-induced CBG 135 > 222; hemoglobin A1c done 2 months ago was 5.9 Patient was on steroids at home per med rec Continue SSI as indicated for steroid induced hyperglycemia Continue diet modification  GERD Continue Protonix  BPH Continue Flomax  Essential hypertension Continue amlodipine   DVT prophylaxis: Lovenox  Code Status: Full code  Family Communication: None at bedside  Disposition Plan:  Patient is from:                        home Anticipated DC to:                   SNF or family members home Anticipated DC date:               2-3 days Anticipated DC barriers:         Patient requires inpatient management due to CHF exacerbation  Consults called: Cardiology  Admission status: Inpatient    Bernadette Hoit MD [facility]   10/28/2021, 7:50 AM

## 2021-10-27 NOTE — ED Provider Notes (Signed)
[facility] EMERGENCY DEPARTMENT Provider Note   CSN: [id number] Arrival date & time: 10/27/21  1854     History Chief Complaint  Patient presents with   Leg Swelling    Daniel Barker is a 75 y.o. male.  He has a history of COPD on 2 L nasal cannula, coronary disease, A. fib.  Was admitted last month for AKI acute on chronic respiratory failure with hypoxia hypercapnia, CHF.  He said he diuresed well in the hospital but his legs have been increasingly swollen and he is more short of breath.  Denies any fevers or chest pain.  Does have a nonproductive cough.  No abdominal pain vomiting or diarrhea.  The history is provided by the patient.  Shortness of Breath Severity:  Moderate Onset quality:  Gradual Duration:  3 weeks Timing:  Intermittent Progression:  Unchanged Chronicity:  Recurrent Relieved by:  Nothing Worsened by:  Activity Ineffective treatments:  Diuretics and oxygen Associated symptoms: cough   Associated symptoms: no abdominal pain, no chest pain, no fever, no headaches, no neck pain, no rash, no sore throat, no sputum production and no vomiting   Risk factors: no tobacco use       Past Medical History:  Diagnosis Date   Acute metabolic encephalopathy 03/29/7740   Asthma    Atrial fibrillation (HCC)    BPH (benign prostatic hyperplasia)    Cigarette smoker    CKD (chronic kidney disease)    COPD (chronic obstructive pulmonary disease) (HCC)    Coronary artery disease    Cough    High cholesterol    Hx of CABG    Hypercholesteremia    Hypertension    Localized edema    Stented coronary artery     Patient Active Problem List   Diagnosis Date Noted   Hypokalemia 09/17/2021   Chronic anemia 09/17/2021   History of peripheral edema 28/78/6767   Acute diastolic CHF (congestive heart failure) ([place]) 09/17/2021   Acute exacerbation of chronic obstructive airways disease ([place]) 09/16/2021   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 08/18/2021   Steroid-induced  hyperglycemia 08/18/2021   Acute hypoxemic respiratory failure ([place]) 08/09/2021   Hypoproteinemia ([place]) 07/18/2021   Acute respiratory failure with hypoxia and hypercarbia ([place]) 07/17/2021   COPD exacerbation ([place]) 07/17/2021   Chronic kidney disease, stage 3a ([place]) 07/03/2021   Acute on chronic diastolic CHF (congestive heart failure) ([place]) 07/03/2021   History of fracture 07/03/2021   SOB (shortness of breath) 06/05/2021   Elevated troponin 06/05/2021   Acute on chronic respiratory failure with hypoxia and hypercapnia (HCC) - baseline PCO2 80-90s, chronically on 2 L/min at home. 05/19/2021   COVID-19 virus infection 05/18/2021   Essential tremor 05/04/2021   Paroxysmal atrial fibrillation ([place]) 04/25/2021   Chronic kidney disease 04/25/2021   Asthma 04/25/2021   H/O coronary angioplasty 04/25/2021   Impaired fasting glucose 04/25/2021   Cellulitis 04/25/2021   Edema 04/25/2021   Nicotine dependence 04/25/2021   Overweight 04/25/2021   Prediabetes 04/25/2021   Tremor 04/25/2021   Vitamin D deficiency 04/25/2021   Mixed hyperlipidemia 04/16/2021   Closed displaced spiral fracture of shaft of left humerus 12/23/2020   Chronic respiratory failure with hypoxia and hypercapnia ([place]) 09/02/2020   Urinary retention 07/08/2020   Benign prostatic hyperplasia with lower urinary tract symptoms 07/08/2020   COPD GOLD IV  05/28/2020   Cigarette smoker 05/28/2020   Acute kidney injury superimposed on CKD ([place]) 06/03/2014   Anemia of chronic disease 06/03/2014   Atherosclerosis of coronary artery  without angina pectoris 06/03/2014   Acute respiratory failure with hypercapnia ([place]) 06/01/2014   Type 2 diabetes mellitus ([place]) 06/01/2014   Low blood pressure 06/01/2014    Past Surgical History:  Procedure Laterality Date   CARDIAC SURGERY     CORONARY ARTERY BYPASS GRAFT         Family History  Problem Relation Age of Onset   Hypertension Mother    Clotting disorder Mother    COPD  Father     Social History   Tobacco Use   Smoking status: Former    Packs/day: 1.00    Years: 60.00    Pack years: 60.00    Types: Cigarettes    Quit date: 09/01/2020    Years since quitting: 1.1   Smokeless tobacco: Never  Vaping Use   Vaping Use: Never used  Substance Use Topics   Alcohol use: Never   Drug use: Never    Home Medications Prior to Admission medications   Medication Sig Start Date End Date Taking? Authorizing Provider  acetaminophen (TYLENOL) 500 MG tablet Take 1,000 mg by mouth every 6 (six) hours as needed for mild pain.    [provider]  albuterol (PROVENTIL) (2.5 MG/3ML) 0.083% nebulizer solution INHALE CONTENTS OF 1 VIAL IN NEBULIZER EVERY 6 HOURS AS NEEDED FOR WHEEZING/SHORTNESS OF BREATH Patient taking differently: Take 2.5 mg by nebulization every 6 (six) hours as needed for shortness of breath or wheezing. 09/13/21   Tanda Rockers, MD  albuterol (VENTOLIN HFA) 108 (90 Base) MCG/ACT inhaler INHALE 2 PUFFS BY MOUTH EVERY 4 HOURS AS NEEDED Patient taking differently: Inhale 1-2 puffs into the lungs every 4 (four) hours as needed for wheezing or shortness of breath. 09/06/21   Tanda Rockers, MD  amLODipine (NORVASC) 5 MG tablet Take 5 mg by mouth daily. 06/04/21   [provider]  azithromycin (ZITHROMAX) 250 MG tablet Take 1 tablet (250 mg total) by mouth daily. 09/20/21   Barton Dubois, MD  bisoprolol (ZEBETA) 5 MG tablet Take 0.5 tablets (2.5 mg total) by mouth daily. HOLD FOR  HEART RATE LESS THAN 65 OR SBP LESS THAN 95 08/14/21   Johnson, Clanford L, MD  budesonide-formoterol [facility]) 160-4.5 MCG/ACT inhaler Take 2 puffs first thing in am and then another 2 puffs about 12 hours later. Patient taking differently: Inhale 2 puffs into the lungs every 12 (twelve) hours. Take 2 puffs first thing in am and then another 2 puffs about 12 hours later. 10/27/20   Tanda Rockers, MD  clopidogrel (PLAVIX) 75 MG tablet Take 75 mg by mouth daily.     [provider]  dextromethorphan-guaiFENesin (MUCINEX DM) 30-600 MG 12hr tablet Take 1 tablet by mouth 2 (two) times daily. 09/20/21   Barton Dubois, MD  fenofibrate micronized (LOFIBRA) 134 MG capsule Take 134 mg by mouth daily before breakfast.    [provider]  fluticasone (FLONASE) 50 MCG/ACT nasal spray Place 2 sprays into both nostrils in the morning and at bedtime. Patient not taking: No sig reported 08/20/21   Tanda Rockers, MD  montelukast (SINGULAIR) 10 MG tablet Take 10 mg by mouth at bedtime.    [provider]  pantoprazole (PROTONIX) 40 MG tablet Take 1 tablet (40 mg total) by mouth 2 (two) times daily. 05/23/21   Barton Dubois, MD  potassium chloride SA (KLOR-CON) 20 MEQ tablet Take 1 tablet (20 mEq total) by mouth daily. 09/20/21   Barton Dubois, MD  predniSONE (DELTASONE) 10 MG  tablet Take 1 tablet (10 mg total) by mouth daily. 09/20/21   Barton Dubois, MD  predniSONE (DELTASONE) 10 MG tablet Take 5 tablets daily X 1 day; then 4 tablets daily X 2 days, then 3 tablets daily X 2 days, then 2 tablets daily X 4 days; and then resume 1 tab daily 09/20/21   Barton Dubois, MD  tamsulosin (FLOMAX) 0.4 MG CAPS capsule TAKE 1 CAPSULE(0.4 MG) BY MOUTH DAILY Patient taking differently: Take 0.4 mg by mouth daily. 06/30/21   McKenzie, Candee Furbish, MD  Tiotropium Bromide Monohydrate (SPIRIVA RESPIMAT) 2.5 MCG/ACT AERS Inhale 2 puffs into the lungs daily. 08/20/21   Tanda Rockers, MD  torsemide (DEMADEX) 20 MG tablet Take 1 tablet (20 mg total) by mouth daily. 09/20/21 10/20/21  Barton Dubois, MD  zinc sulfate 220 (50 Zn) MG capsule Take 1 capsule (220 mg total) by mouth daily. 05/24/21   Barton Dubois, MD    Allergies    Lipitor [atorvastatin]  Review of Systems   Review of Systems  Constitutional:  Negative for fever.  HENT:  Negative for sore throat.   Eyes:  Negative for visual disturbance.  Respiratory:  Positive for cough and shortness of breath.  Negative for sputum production.   Cardiovascular:  Positive for leg swelling. Negative for chest pain.  Gastrointestinal:  Negative for abdominal pain and vomiting.  Genitourinary:  Negative for dysuria.  Musculoskeletal:  Negative for neck pain.  Skin:  Negative for rash.  Neurological:  Negative for headaches.   Physical Exam Updated Vital Signs BP 126/73 (BP Location: Right Arm)   Pulse 63   Temp 97.7 F (36.5 C) (Oral)   Resp 19   Ht 5' 6.5" (1.689 m)   Wt 77.6 kg   SpO2 97%   BMI 27.19 kg/m   Physical Exam Vitals and nursing note reviewed.  Constitutional:      General: He is not in acute distress.    Appearance: Normal appearance. He is well-developed.  HENT:     Head: Normocephalic and atraumatic.  Eyes:     Conjunctiva/sclera: Conjunctivae normal.  Cardiovascular:     Rate and Rhythm: Normal rate and regular rhythm.     Heart sounds: No murmur heard. Pulmonary:     Effort: Pulmonary effort is normal. No respiratory distress.     Breath sounds: Wheezing (Few scattered) present.  Abdominal:     Palpations: Abdomen is soft.     Tenderness: There is no abdominal tenderness.  Musculoskeletal:        General: No swelling.     Cervical back: Neck supple.     Right lower leg: Edema present.     Left lower leg: Edema present.     Comments: Symmetric pitting edema.  No cords appreciated.  Skin:    General: Skin is warm and dry.     Capillary Refill: Capillary refill takes less than 2 seconds.  Neurological:     General: No focal deficit present.     Mental Status: He is alert.  Psychiatric:        Mood and Affect: Mood normal.    ED Results / Procedures / Treatments   Labs (all labs ordered are listed, but only abnormal results are displayed) Labs Reviewed  COMPREHENSIVE METABOLIC PANEL - Abnormal; Notable for the following components:      Result Value   CO2 33 (*)    Glucose, Bld 135 (*)    BUN 61 (*)    Creatinine, Ser  2.32 (*)    Total Protein 6.1  (*)    Alkaline Phosphatase 29 (*)    GFR, Estimated 29 (*)    All other components within normal limits  CBC WITH DIFFERENTIAL/PLATELET - Abnormal; Notable for the following components:   WBC 11.3 (*)    RBC 3.44 (*)    Hemoglobin 10.4 (*)    HCT 35.7 (*)    MCV 103.8 (*)    MCHC 29.1 (*)    Neutro Abs 9.2 (*)    Abs Immature Granulocytes 0.23 (*)    All other components within normal limits  MAGNESIUM - Abnormal; Notable for the following components:   Magnesium 2.5 (*)    All other components within normal limits  BRAIN NATRIURETIC PEPTIDE - Abnormal; Notable for the following components:   B Natriuretic Peptide 497.0 (*)    All other components within normal limits  COMPREHENSIVE METABOLIC PANEL - Abnormal; Notable for the following components:   CO2 37 (*)    Glucose, Bld 222 (*)    BUN 54 (*)    Creatinine, Ser 2.07 (*)    Total Protein 6.1 (*)    Alkaline Phosphatase 28 (*)    GFR, Estimated 33 (*)    All other components within normal limits  CBC - Abnormal; Notable for the following components:   WBC 10.6 (*)    RBC 3.45 (*)    Hemoglobin 10.8 (*)    HCT 35.3 (*)    MCV 102.3 (*)    All other components within normal limits  RESP PANEL BY RT-PCR (FLU A&B, COVID) ARPGX2  APTT  MAGNESIUM  PHOSPHORUS  HEMOGLOBIN A1C  TROPONIN I (HIGH SENSITIVITY)  TROPONIN I (HIGH SENSITIVITY)    EKG EKG Interpretation  Date/Time:  Wednesday October 27 2021 19:14:10 EST Ventricular Rate:  61 PR Interval:    QRS Duration: 102 QT Interval:  418 QTC Calculation: 420 R Axis:   87 Text Interpretation: Junctional rhythm Septal infarct , age undetermined Abnormal ECG Poor data quality in current ECG precludes serial comparison Confirmed by Aletta Edouard [phone]) on 10/27/2021 7:24:00 PM  Radiology DG Chest 2 View  Result Date: 10/27/2021 CLINICAL DATA:  75 year old male with history of shortness of breath. Bilateral leg swelling. EXAM: CHEST - 2 VIEW COMPARISON:  Chest  x-ray 09/16/2021. FINDINGS: Lung volumes are increased with emphysematous changes. No consolidative airspace disease. No pleural effusions. No pneumothorax. No pulmonary nodule or mass noted. Pulmonary vasculature and the cardiomediastinal silhouette are within normal limits. Atherosclerosis in the thoracic aorta. Status post median sternotomy. IMPRESSION: 1. No radiographic evidence of acute cardiopulmonary disease. 2. Emphysema. 3. Aortic atherosclerosis. Electronically Signed   By: Vinnie Langton M.D.   On: 10/27/2021 19:57    Procedures Procedures   Medications Ordered in ED Medications  albuterol (PROVENTIL) (2.5 MG/3ML) 0.083% nebulizer solution 2.5 mg (has no administration in time range)  enoxaparin (LOVENOX) injection 30 mg (has no administration in time range)  acetaminophen (TYLENOL) tablet 650 mg (has no administration in time range)    Or  acetaminophen (TYLENOL) suppository 650 mg (has no administration in time range)  dextromethorphan-guaiFENesin (MUCINEX DM) 30-600 MG per 12 hr tablet 1 tablet (has no administration in time range)  guaiFENesin-dextromethorphan (ROBITUSSIN DM) 100-10 MG/5ML syrup 5 mL (has no administration in time range)  predniSONE (DELTASONE) tablet 10 mg (has no administration in time range)  pantoprazole (PROTONIX) EC tablet 40 mg (has no administration in time range)  tamsulosin (FLOMAX) capsule 0.4 mg (has  no administration in time range)  clopidogrel (PLAVIX) tablet 75 mg (has no administration in time range)  montelukast (SINGULAIR) tablet 10 mg (has no administration in time range)  umeclidinium bromide (INCRUSE ELLIPTA) 62.5 MCG/ACT 2 puff (has no administration in time range)  amLODipine (NORVASC) tablet 5 mg (has no administration in time range)  furosemide (LASIX) injection 40 mg (has no administration in time range)  furosemide (LASIX) injection 20 mg (has no administration in time range)  azithromycin (ZITHROMAX) tablet 500 mg (has no  administration in time range)  ipratropium-albuterol (DUONEB) 0.5-2.5 (3) MG/3ML nebulizer solution 3 mL (has no administration in time range)  insulin aspart (novoLOG) injection 0-9 Units (has no administration in time range)  insulin aspart (novoLOG) injection 0-5 Units (has no administration in time range)  methylPREDNISolone sodium succinate (SOLU-MEDROL) 125 mg/2 mL injection 125 mg (125 mg Intravenous Given 10/27/21 2238)  furosemide (LASIX) injection 40 mg (40 mg Intravenous Given 10/27/21 2238)    ED Course  I have reviewed the triage vital signs and the nursing notes.  Pertinent labs & imaging results that were available during my care of the patient were reviewed by me and considered in my medical decision making (see chart for details).  Clinical Course as of 10/28/21 1000  Wed Oct 27, 2021  2209 Discussed with Dr. Josephine Cables [facility] hospitalist who will evaluate the patient for admission. [MB]    Clinical Course User Index [MB] Hayden Rasmussen, MD   MDM Rules/Calculators/A&P                          Taysen Bushart was evaluated in Emergency Department on 10/27/2021 for the symptoms described in the history of present illness. He was evaluated in the context of the global COVID-19 pandemic, which necessitated consideration that the patient might be at risk for infection with the SARS-CoV-2 virus that causes COVID-19. Institutional protocols and algorithms that pertain to the evaluation of patients at risk for COVID-19 are in a state of rapid change based on information released by regulatory bodies including the CDC and federal and state organizations. These policies and algorithms were followed during the patient's care in the ED.  This patient complains of worsening shortness of breath dyspnea on exertion, worsening leg swelling; this involves an extensive number of treatment Options and is a complaint that carries with it a high risk of complications and Morbidity. The  differential includes CHF, COPD, pneumonia, COVID, flu, peripheral edema, renal failure, metabolic derangement  I ordered, reviewed and interpreted labs, which included CBC with mildly elevated white count, hemoglobin low stable from priors, chemistries with elevated glucose elevated BUN/creatinine, BNP elevated although similar to priors, troponins not elevated, COVID and flu negative I ordered medication IV Lasix IV steroids I ordered imaging studies which included chest x-ray and I independently    visualized and interpreted imaging which showed no acute findings  Previous records obtained and reviewed in epic including recent admission I consulted [facility] hospitalist Dr. Josephine Cables and discussed lab and imaging findings  Critical Interventions: None  After the interventions stated above, I reevaluated the patient and found patient to be stable on his home oxygen.  Feel he would benefit from admission to the hospital for diuresis and further work-up of his shortness of breath.  Patient agreeable to plan.   Final Clinical Impression(s) / ED Diagnoses Final diagnoses:  Acute on chronic congestive heart failure, unspecified heart failure type ([place])  COPD exacerbation ([place])  Rx / DC Orders ED Discharge Orders     None        Hayden Rasmussen, MD 10/28/21 1008

## 2021-10-27 NOTE — ED Triage Notes (Signed)
Pt c/o right leg swelling and went to see his PCP who did routine lab work and called pt today and told him to come to ED because he might not make it through the weekend; pt states they told him his kidney function was elevated; pt c/o increased sob

## 2021-10-28 ENCOUNTER — Encounter (HOSPITAL_COMMUNITY): Payer: Self-pay | Admitting: Internal Medicine

## 2021-10-28 ENCOUNTER — Inpatient Hospital Stay (HOSPITAL_COMMUNITY): Payer: Medicare Other

## 2021-10-28 DIAGNOSIS — I5031 Acute diastolic (congestive) heart failure: Secondary | ICD-10-CM | POA: Diagnosis not present

## 2021-10-28 LAB — ECHOCARDIOGRAM COMPLETE
AR max vel: 2.6 cm2
AV Area VTI: 2.45 cm2
AV Area mean vel: 2.42 cm2
AV Mean grad: 5 mmHg
AV Peak grad: 13 mmHg
Ao pk vel: 1.8 m/s
Area-P 1/2: 3.23 cm2
Height: 66.5 in
MV VTI: 2.41 cm2
S' Lateral: 2.3 cm
Weight: 2730.18 oz

## 2021-10-28 LAB — COMPREHENSIVE METABOLIC PANEL
ALT: 18 U/L (ref 0–44)
AST: 16 U/L (ref 15–41)
Albumin: 3.5 g/dL (ref 3.5–5.0)
Alkaline Phosphatase: 28 U/L — ABNORMAL LOW (ref 38–126)
Anion gap: 7 (ref 5–15)
BUN: 54 mg/dL — ABNORMAL HIGH (ref 8–23)
CO2: 37 mmol/L — ABNORMAL HIGH (ref 22–32)
Calcium: 9.3 mg/dL (ref 8.9–10.3)
Chloride: 100 mmol/L (ref 98–111)
Creatinine, Ser: 2.07 mg/dL — ABNORMAL HIGH (ref 0.61–1.24)
GFR, Estimated: 33 mL/min — ABNORMAL LOW (ref >60)
Glucose, Bld: 222 mg/dL — ABNORMAL HIGH (ref 70–99)
Potassium: 4 mmol/L (ref 3.5–5.1)
Sodium: 144 mmol/L (ref 135–145)
Total Bilirubin: 0.6 mg/dL (ref 0.3–1.2)
Total Protein: 6.1 g/dL — ABNORMAL LOW (ref 6.5–8.1)

## 2021-10-28 LAB — PHOSPHORUS: Phosphorus: 3.5 mg/dL (ref 2.5–4.6)

## 2021-10-28 LAB — GLUCOSE, CAPILLARY
Glucose-Capillary: 221 mg/dL — ABNORMAL HIGH (ref 70–99)
Glucose-Capillary: 148 mg/dL — ABNORMAL HIGH (ref 70–99)
Glucose-Capillary: 179 mg/dL — ABNORMAL HIGH (ref 70–99)

## 2021-10-28 LAB — CBC
HCT: 35.3 % — ABNORMAL LOW (ref 39.0–52.0)
Hemoglobin: 10.8 g/dL — ABNORMAL LOW (ref 13.0–17.0)
MCH: 31.3 pg (ref 26.0–34.0)
MCHC: 30.6 g/dL (ref 30.0–36.0)
MCV: 102.3 fL — ABNORMAL HIGH (ref 80.0–100.0)
Platelets: 179 10*3/uL (ref 150–400)
RBC: 3.45 MIL/uL — ABNORMAL LOW (ref 4.22–5.81)
RDW: 14.2 % (ref 11.5–15.5)
WBC: 10.6 10*3/uL — ABNORMAL HIGH (ref 4.0–10.5)
nRBC: 0 % (ref 0.0–0.2)

## 2021-10-28 LAB — MAGNESIUM: Magnesium: 2.4 mg/dL (ref 1.7–2.4)

## 2021-10-28 LAB — APTT: aPTT: 24 seconds (ref 24–36)

## 2021-10-28 MED ORDER — DM-GUAIFENESIN ER 30-600 MG PO TB12
1.0000 | ORAL_TABLET | Freq: Two times a day (BID) | ORAL | Status: DC
  Administered 2021-10-28 – 2021-10-30 (×5): 1 via ORAL
  Filled 2021-10-28 (×5): qty 1

## 2021-10-28 MED ORDER — FUROSEMIDE 10 MG/ML IJ SOLN
40.0000 mg | Freq: Two times a day (BID) | INTRAMUSCULAR | Status: DC
  Administered 2021-10-28: 40 mg via INTRAVENOUS
  Filled 2021-10-28: qty 4

## 2021-10-28 MED ORDER — GUAIFENESIN-DM 100-10 MG/5ML PO SYRP: 5.0000 mL | ORAL_SOLUTION | ORAL | Status: DC | PRN

## 2021-10-28 MED ORDER — INSULIN ASPART 100 UNIT/ML IJ SOLN: 0.0000 [IU] | INTRAMUSCULAR | Status: DC

## 2021-10-28 MED ORDER — ACETAMINOPHEN 650 MG RE SUPP: 650.0000 mg | Freq: Four times a day (QID) | RECTAL | Status: DC | PRN

## 2021-10-28 MED ORDER — IPRATROPIUM-ALBUTEROL 0.5-2.5 (3) MG/3ML IN SOLN: 3.0000 mL | Freq: Four times a day (QID) | RESPIRATORY_TRACT | Status: DC

## 2021-10-28 MED ORDER — TAMSULOSIN HCL 0.4 MG PO CAPS
0.4000 mg | ORAL_CAPSULE | Freq: Every day | ORAL | Status: DC
  Administered 2021-10-28 – 2021-10-30 (×3): 0.4 mg via ORAL
  Filled 2021-10-28 (×3): qty 1

## 2021-10-28 MED ORDER — AMLODIPINE BESYLATE 5 MG PO TABS
5.0000 mg | ORAL_TABLET | Freq: Every day | ORAL | Status: DC
  Administered 2021-10-28 – 2021-10-30 (×3): 5 mg via ORAL
  Filled 2021-10-28 (×3): qty 1

## 2021-10-28 MED ORDER — DM-GUAIFENESIN ER 30-600 MG PO TB12: 1.0000 | ORAL_TABLET | Freq: Two times a day (BID) | ORAL | Status: DC

## 2021-10-28 MED ORDER — CLOPIDOGREL BISULFATE 75 MG PO TABS
75.0000 mg | ORAL_TABLET | Freq: Every day | ORAL | Status: DC
  Administered 2021-10-28 – 2021-10-30 (×3): 75 mg via ORAL
  Filled 2021-10-28 (×3): qty 1

## 2021-10-28 MED ORDER — INSULIN ASPART 100 UNIT/ML IJ SOLN: 0.0000 [IU] | Freq: Every day | INTRAMUSCULAR | Status: DC

## 2021-10-28 MED ORDER — UMECLIDINIUM BROMIDE 62.5 MCG/ACT IN AEPB
2.0000 | INHALATION_SPRAY | Freq: Every day | RESPIRATORY_TRACT | Status: DC
Start: 2021-10-28 — End: 2021-10-30

## 2021-10-28 MED ORDER — INSULIN ASPART 100 UNIT/ML IJ SOLN
0.0000 [IU] | Freq: Three times a day (TID) | INTRAMUSCULAR | Status: DC
  Administered 2021-10-28: 3 [IU] via SUBCUTANEOUS
  Administered 2021-10-28: 1 [IU] via SUBCUTANEOUS
  Administered 2021-10-29: 2 [IU] via SUBCUTANEOUS
  Administered 2021-10-29: 1 [IU] via SUBCUTANEOUS
  Administered 2021-10-30: 2 [IU] via SUBCUTANEOUS
  Administered 2021-10-30: 5 [IU] via SUBCUTANEOUS

## 2021-10-28 MED ORDER — MONTELUKAST SODIUM 10 MG PO TABS
10.0000 mg | ORAL_TABLET | Freq: Every day | ORAL | Status: DC
  Administered 2021-10-28 – 2021-10-29 (×2): 10 mg via ORAL
  Filled 2021-10-28 (×2): qty 1

## 2021-10-28 MED ORDER — ENOXAPARIN SODIUM 30 MG/0.3ML IJ SOSY
30.0000 mg | PREFILLED_SYRINGE | INTRAMUSCULAR | Status: DC
  Administered 2021-10-28: 30 mg via SUBCUTANEOUS
  Filled 2021-10-28: qty 0.3

## 2021-10-28 MED ORDER — PREDNISONE 20 MG PO TABS
10.0000 mg | ORAL_TABLET | Freq: Every day | ORAL | Status: DC
  Administered 2021-10-28 – 2021-10-29 (×2): 10 mg via ORAL
  Filled 2021-10-28 (×3): qty 1

## 2021-10-28 MED ORDER — AZITHROMYCIN 250 MG PO TABS
500.0000 mg | ORAL_TABLET | Freq: Every day | ORAL | Status: DC
  Administered 2021-10-28 – 2021-10-30 (×3): 500 mg via ORAL
  Filled 2021-10-28 (×3): qty 2

## 2021-10-28 MED ORDER — ACETAMINOPHEN 325 MG PO TABS
650.0000 mg | ORAL_TABLET | Freq: Four times a day (QID) | ORAL | Status: DC | PRN
  Administered 2021-10-28: 650 mg via ORAL
  Filled 2021-10-28: qty 2

## 2021-10-28 MED ORDER — INSULIN ASPART 100 UNIT/ML IJ SOLN: 0.0000 [IU] | Freq: Three times a day (TID) | INTRAMUSCULAR | Status: DC

## 2021-10-28 MED ORDER — METHYLPREDNISOLONE SODIUM SUCC 40 MG IJ SOLR: 40.0000 mg | Freq: Two times a day (BID) | INTRAMUSCULAR | Status: DC

## 2021-10-28 MED ORDER — PANTOPRAZOLE SODIUM 40 MG PO TBEC
40.0000 mg | DELAYED_RELEASE_TABLET | Freq: Two times a day (BID) | ORAL | Status: DC
  Administered 2021-10-28 – 2021-10-30 (×5): 40 mg via ORAL
  Filled 2021-10-28 (×5): qty 1

## 2021-10-28 MED ORDER — IPRATROPIUM-ALBUTEROL 0.5-2.5 (3) MG/3ML IN SOLN
3.0000 mL | Freq: Four times a day (QID) | RESPIRATORY_TRACT | Status: DC
  Administered 2021-10-28 – 2021-10-29 (×4): 3 mL via RESPIRATORY_TRACT
  Filled 2021-10-28 (×4): qty 3

## 2021-10-28 MED ORDER — FUROSEMIDE 10 MG/ML IJ SOLN
40.0000 mg | Freq: Every day | INTRAMUSCULAR | Status: DC
  Administered 2021-10-29 – 2021-10-30 (×2): 40 mg via INTRAVENOUS
  Filled 2021-10-28 (×3): qty 4

## 2021-10-28 MED ORDER — FUROSEMIDE 10 MG/ML IJ SOLN
20.0000 mg | Freq: Once | INTRAMUSCULAR | Status: AC
  Administered 2021-10-28: 20 mg via INTRAVENOUS
  Filled 2021-10-28: qty 2

## 2021-10-28 MED ORDER — INFLUENZA VAC A&B SA ADJ QUAD 0.5 ML IM PRSY
0.5000 mL | PREFILLED_SYRINGE | INTRAMUSCULAR | Status: AC
  Administered 2021-10-29: 0.5 mL via INTRAMUSCULAR
  Filled 2021-10-28: qty 0.5

## 2021-10-28 NOTE — TOC Initial Note (Signed)
Transition of Care [facility]) - Initial/Assessment Note    Patient Details  Name: Daniel Barker MRN: [id number] Date of Birth: 1946-09-19  Transition of Care [facility]) CM/SW Contact:    Iona Beard, [place] Phone Number: 10/28/2021, 8:54 AM  Clinical Narrative:                 [facility] consulted for CHF screen. CSW spoke with pt to complete assessment. Pt states that he lives with his wife and step-daughter. Pt states that he is independent in completing his ADLs. Pt states that his step-daughter provides transportation when needed. Pt states that he has a physical therapist coming to his home currently though he is not sure of the company. Per chart review pt was recently set up with [facility] services through [place]. Pt states that his wife has a walker and wheelchair and that he has been using her walker but she needs it now. Pt states that he would like a rollator. CSW informed pt that we will work on getting that for pt if possible. Pt states that he uses 2L O2 at home and that it is supplied through [employer].   CSW completed CHF screen with pt. Pt states that he has been weighing himself daily. Pt states that he takes all medications as they are prescribed. Pt states that he tries to follow a heart healthy diet. Pts cardiologist is Dr. [name]. TOC to follow.   Expected Discharge Plan: [place] Barriers to Discharge: Continued Medical Work up   Patient Goals and CMS Choice Patient states their goals for this hospitalization and ongoing recovery are:: Return home with [facility] CMS Medicare.gov Compare Post Acute Care list provided to:: Patient Choice offered to / list presented to : Patient  Expected Discharge Plan and Services Expected Discharge Plan: [place] In-house Referral: Clinical Social Work Discharge Planning Services: CM Consult Post Acute Care Choice: Home Health, Durable Medical Equipment Living arrangements for the past 2 months: Single Family  Home                                      Prior Living Arrangements/Services Living arrangements for the past 2 months: Single Family Home Lives with:: Spouse, Adult Children Patient language and need for interpreter reviewed:: Yes Do you feel safe going back to the place where you live?: Yes      Need for Family Participation in Patient Care: Yes (Comment) Care giver support system in place?: Yes (comment) Current home services: DME (BSC and O2) Criminal Activity/Legal Involvement Pertinent to Current Situation/Hospitalization: No - Comment as needed  Activities of Daily Living      Permission Sought/Granted                  Emotional Assessment Appearance:: Appears stated age Attitude/Demeanor/Rapport: Engaged Affect (typically observed): Accepting Orientation: : Oriented to Self, Oriented to Place, Oriented to  Time, Oriented to Situation Alcohol / Substance Use: Not Applicable Psych Involvement: No (comment)  Admission diagnosis:  Acute exacerbation of CHF (congestive heart failure) (HCC) [I50.9] Patient Active Problem List   Diagnosis Date Noted   Hypermagnesemia 10/28/2021   Acute exacerbation of CHF (congestive heart failure) ([place]) 10/27/2021   Hypokalemia 09/17/2021   Chronic anemia 09/17/2021   History of peripheral edema 93/81/8299   Acute diastolic CHF (congestive heart failure) ([place]) 09/17/2021   Acute exacerbation of chronic obstructive airways  disease ([place]) 09/16/2021   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 08/18/2021   Steroid-induced hyperglycemia 08/18/2021   Acute hypoxemic respiratory failure ([place]) 08/09/2021   Hypoproteinemia ([place]) 07/18/2021   Acute respiratory failure with hypoxia and hypercarbia ([place]) 07/17/2021   COPD exacerbation ([place]) 07/17/2021   Chronic kidney disease, stage 3a ([place]) 07/03/2021   Acute on chronic diastolic CHF (congestive heart failure) ([place]) 07/03/2021   History of fracture 07/03/2021   SOB (shortness of breath)  06/05/2021   Elevated troponin 06/05/2021   Acute on chronic respiratory failure with hypoxia and hypercapnia (HCC) - baseline PCO2 80-90s, chronically on 2 L/min at home. 05/19/2021   COVID-19 virus infection 05/18/2021   Essential tremor 05/04/2021   Paroxysmal atrial fibrillation ([place]) 04/25/2021   Chronic kidney disease 04/25/2021   Asthma 04/25/2021   H/O coronary angioplasty 04/25/2021   Impaired fasting glucose 04/25/2021   Cellulitis 04/25/2021   Edema 04/25/2021   Nicotine dependence 04/25/2021   Overweight 04/25/2021   Prediabetes 04/25/2021   Tremor 04/25/2021   Vitamin D deficiency 04/25/2021   Mixed hyperlipidemia 04/16/2021   Closed displaced spiral fracture of shaft of left humerus 12/23/2020   Chronic respiratory failure with hypoxia ([place]) 09/02/2020   Urinary retention 07/08/2020   Benign prostatic hyperplasia with lower urinary tract symptoms 07/08/2020   COPD (chronic obstructive pulmonary disease) ([place]) 05/28/2020   Cigarette smoker 05/28/2020   Acute kidney injury superimposed on CKD ([place]) 06/03/2014   Anemia of chronic disease 06/03/2014   Atherosclerosis of coronary artery without angina pectoris 06/03/2014   Acute respiratory failure with hypercapnia ([place]) 06/01/2014   Type 2 diabetes mellitus ([place]) 06/01/2014   Low blood pressure 06/01/2014   PCP:  [name], MD Pharmacy:   [facility] [phone] - [place], [place] [address] AT [place] [address] [place] [address] Phone: [phone] Fax: [phone]     Social Determinants of Health (SDOH) Interventions    Readmission Risk Interventions Readmission Risk Prevention Plan 10/28/2021 09/20/2021 08/12/2021  Transportation Screening Complete Complete Complete  PCP or Specialist Appt within 5-7 Days - - -  Home Care Screening - - -  Medication Review ([place]) Complete Complete Complete  PCP or Specialist appointment within 3-5 days of  discharge - Complete -  [place] or Home Care Consult Complete Complete Complete  SW Recovery Care/Counseling Consult Complete - Complete  Palliative Care Screening Not Applicable - Not [place] Not Applicable - Not Applicable

## 2021-10-28 NOTE — Progress Notes (Signed)
PROGRESS NOTE     Daniel Barker, is a 75 y.o. male, DOB - 1945/12/25, IWP:[id number]  Admit date - 10/27/2021   Admitting Physician Bernadette Hoit, DO  Outpatient Primary MD for the patient is [name], MD  LOS - 1  Chief Complaint  Patient presents with   Leg Swelling        Brief Narrative:  75 y.o. 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 at 2 L/min, COPD, chronic diastolic heart failure, type 2 diabetes mellitus, chronic atrial fibrillation on anticoagulation, CKD stage III, hard of hearing admitted on 10/27/2021 with acute hypoxic respiratory failure secondary to CHF  Assessment & Plan:   Principal Problem:   Acute on chronic diastolic CHF (congestive heart failure) (HCC) Active Problems:   Chronic respiratory failure with hypoxia (HCC)   COPD (chronic obstructive pulmonary disease) (HCC)   Acute kidney injury superimposed on CKD ([place])   Hypermagnesemia   1)HFpEF- acute on chronic diastolic dysfunction CHF exacerbation--- shortness of breath persist -dyspnea on exertion and hypoxia persist -continue IV diuretics and supplemental oxygen --Echo with EF of 65 to 70% and grade 2 diastolic dysfunction  2) acute COPD exacerbation/emphysema--- cough, wheezing or shortness of breath, dyspnea on exertion persist -Continue bronchodilators steroids and bronchodilators  3) chronic anemia of CKD--hgb stable  4)AKI----acute kidney injury on CKD stage -3B -Creatinine up to 2.32, baseline usually around 1.8 -renally adjust medications, avoid nephrotoxic agents / dehydration  / hypotension  5) steroid-induced hyperglycemia--- A1c was 5.9% -Use Novolog/Humalog Sliding scale insulin with Accu-Cheks/Fingersticks as ordered   6) acute on chronic hypoxic respiratory failure--- secondary to #1 #12 -Continue oxygen supplementation  7) generalized weakness and deconditioning--- anticipate possible discharge  home with home health services, patient needs a rolling walker  Disposition/Need for in-Hospital Stay- patient unable to be discharged at this time due to --worsening hypoxia with respiratory failure secondary to CHF and COPD exacerbation -Anticipate discharge home in a couple of days if continues to improve  Status is: Inpatient  Remains inpatient appropriate because: Disposition above  Disposition: The patient is from: Home              Anticipated d/c is to: Home with [facility]              Anticipated d/c date is: 2 days              Patient currently is not medically stable to d/c. Barriers: Not Clinically Stable-   Code Status :  -  Code Status: Full Code   Family Communication:   (patient is alert, awake and coherent)   Consults  :  na  DVT Prophylaxis  :   - SCDs   SCDs Start: 10/28/21 0054 enoxaparin (LOVENOX) injection 30 mg Start: 10/28/21 0053  Lab Results  Component Value Date   PLT 179 10/28/2021    Inpatient Medications  Scheduled Meds:  amLODipine  5 mg Oral Daily   azithromycin  500 mg Oral Daily   clopidogrel  75 mg Oral Daily   dextromethorphan-guaiFENesin  1 tablet Oral BID   enoxaparin (LOVENOX) injection  30 mg Subcutaneous Q24H   [START ON 10/29/2021] furosemide  40 mg Intravenous Daily   [START ON 10/29/2021] influenza vaccine adjuvanted  0.5 mL Intramuscular Tomorrow-1000   insulin aspart  0-5 Units Subcutaneous QHS   insulin aspart  0-9 Units Subcutaneous TID WC   ipratropium-albuterol  3 mL Nebulization QID   montelukast  10 mg Oral QHS   pantoprazole  40 mg Oral BID   predniSONE  10 mg Oral Daily   tamsulosin  0.4 mg Oral Daily   umeclidinium bromide  2 puff Inhalation Daily   Continuous Infusions: PRN Meds:.acetaminophen **OR** acetaminophen, albuterol, guaiFENesin-dextromethorphan   Anti-infectives (From admission, onward)    Start     Dose/Rate Route Frequency Ordered Stop   10/28/21 0945  azithromycin (ZITHROMAX) tablet 500 mg        500  mg Oral Daily 10/28/21 0856           Subjective: Daniel Barker today has no fevers, no emesis,  No chest pain,   , Shortness of breath at rest, dyspnea on exertion and hypoxia persist - Objective: Vitals:   10/28/21 0730 10/28/21 0815 10/28/21 1215 10/28/21 1558  BP: 137/72 140/71  127/70  Pulse: 61 66  (!) 59  Resp: 20 20 20    Temp: 97.9 F (36.6 C) 97.6 F (36.4 C)  98.1 F (36.7 C)  TempSrc:  Oral Oral Oral  SpO2: 100% 95% 96% 99%  Weight:  77.4 kg    Height:  5' 6.5" (1.689 m)      Intake/Output Summary (Last 24 hours) at 10/28/2021 1927 Last data filed at 10/28/2021 1700 Gross per 24 hour  Intake 840 ml  Output 3200 ml  Net -2360 ml   Filed Weights   10/27/21 1908 10/28/21 0815  Weight: 77.6 kg 77.4 kg     Physical Exam  Gen:- Awake Alert, positional dyspnea HEENT:- [place].AT, No sclera icterus Nose- [place] 2L/min Neck-Supple Neck,No JVD,.  Lungs-diminished breath sounds, scattered wheezes  CV- S1, S2 normal, regular  Abd-  +ve B.Sounds, Abd Soft, No tenderness,    Extremity/Skin:- 1 + pitting edema, pedal pulses present  Psych-affect is appropriate, oriented x3 Neuro-generalized weakness, no new focal deficits, no tremors  Data Reviewed: I have personally reviewed following labs and imaging studies  CBC: Recent Labs  Lab 10/27/21 1951 10/28/21 0353  WBC 11.3* 10.6*  NEUTROABS 9.2*  --   HGB 10.4* 10.8*  HCT 35.7* 35.3*  MCV 103.8* 102.3*  PLT 197 299   Basic Metabolic Panel: Recent Labs  Lab 10/27/21 1951 10/28/21 0353  NA 141 144  K 3.8 4.0  CL 101 100  CO2 33* 37*  GLUCOSE 135* 222*  BUN 61* 54*  CREATININE 2.32* 2.07*  CALCIUM 9.3 9.3  MG 2.5* 2.4  PHOS  --  3.5   GFR: Estimated Creatinine Clearance: 28.3 mL/min (A) (by C-G formula based on SCr of 2.07 mg/dL (H)). Liver Function Tests: Recent Labs  Lab 10/27/21 1951 10/28/21 0353  AST 15 16  ALT 19 18  ALKPHOS 29* 28*  BILITOT 0.4 0.6  PROT 6.1* 6.1*  ALBUMIN 3.7 3.5    No results for input(s): LIPASE, AMYLASE in the last 168 hours. No results for input(s): AMMONIA in the last 168 hours. Coagulation Profile: No results for input(s): INR, PROTIME in the last 168 hours. Cardiac Enzymes: No results for input(s): CKTOTAL, CKMB, CKMBINDEX, TROPONINI in the last 168 hours. BNP (last 3 results) No results for input(s): PROBNP in the last 8760 hours. HbA1C: No results for input(s): HGBA1C in the last 72 hours. CBG: Recent Labs  Lab 10/28/21 1107 10/28/21 1559  GLUCAP 221* 148*   Lipid Profile: No results for input(s): CHOL, HDL, LDLCALC, TRIG, CHOLHDL, LDLDIRECT in the last 72 hours. Thyroid Function Tests: No results for input(s): TSH, T4TOTAL, FREET4, T3FREE, THYROIDAB in the  last 72 hours. Anemia Panel: No results for input(s): VITAMINB12, FOLATE, FERRITIN, TIBC, IRON, RETICCTPCT in the last 72 hours. Urine analysis:    Component Value Date/Time   COLORURINE YELLOW 07/17/2021 [place] 07/17/2021 1614   LABSPEC 1.015 07/17/2021 1614   PHURINE 5.0 07/17/2021 1614   GLUCOSEU NEGATIVE 07/17/2021 1614   HGBUR NEGATIVE 07/17/2021 1614   [place] 07/17/2021 1614   [place] 07/17/2021 1614   PROTEINUR 100 (A) 07/17/2021 1614   NITRITE NEGATIVE 07/17/2021 1614   LEUKOCYTESUR NEGATIVE 07/17/2021 1614   Sepsis Labs: @LABRCNTIP (procalcitonin:4,lacticidven:4)  ) Recent Results (from the past 240 hour(s))  Resp Panel by RT-PCR (Flu A&B, Covid) Nasopharyngeal Swab     Status: None   Collection Time: 10/27/21  7:45 PM   Specimen: Nasopharyngeal Swab; Nasopharyngeal(NP) swabs in vial transport medium  Result Value Ref Range Status   SARS Coronavirus 2 by RT PCR NEGATIVE NEGATIVE Final    Comment: (NOTE) SARS-CoV-2 target nucleic acids are NOT DETECTED.  The SARS-CoV-2 RNA is generally detectable in upper respiratory specimens during the acute phase of infection. The lowest concentration of SARS-CoV-2 viral  copies this assay can detect is 138 copies/mL. A negative result does not preclude SARS-Cov-2 infection and should not be used as the sole basis for treatment or other patient management decisions. A negative result may occur with  improper specimen collection/handling, submission of specimen other than nasopharyngeal swab, presence of viral mutation(s) within the areas targeted by this assay, and inadequate number of viral copies(<138 copies/mL). A negative result must be combined with clinical observations, patient history, and epidemiological information. The expected result is Negative.  Fact Sheet for Patients:  EntrepreneurPulse.com.au  Fact Sheet for Healthcare Providers:  IncredibleEmployment.be  This test is no t yet approved or cleared by the [place] FDA and  has been authorized for detection and/or diagnosis of SARS-CoV-2 by FDA under an Emergency Use Authorization (EUA). This EUA will remain  in effect (meaning this test can be used) for the duration of the COVID-19 declaration under Section 564(b)(1) of the Act, 21 U.S.C.section 360bbb-3(b)(1), unless the authorization is terminated  or revoked sooner.       Influenza A by PCR NEGATIVE NEGATIVE Final   Influenza B by PCR NEGATIVE NEGATIVE Final    Comment: (NOTE) The Xpert Xpress SARS-CoV-2/FLU/RSV plus assay is intended as an aid in the diagnosis of influenza from Nasopharyngeal swab specimens and should not be used as a sole basis for treatment. Nasal washings and aspirates are unacceptable for Xpert Xpress SARS-CoV-2/FLU/RSV testing.  Fact Sheet for Patients: EntrepreneurPulse.com.au  Fact Sheet for Healthcare Providers: IncredibleEmployment.be  This test is not yet approved or cleared by the [place] FDA and has been authorized for detection and/or diagnosis of SARS-CoV-2 by FDA under an Emergency Use Authorization (EUA). This  EUA will remain in effect (meaning this test can be used) for the duration of the COVID-19 declaration under Section 564(b)(1) of the Act, 21 U.S.C. section 360bbb-3(b)(1), unless the authorization is terminated or revoked.  Performed at [facility], [address] [place], [place] [address]       Radiology Studies: DG Chest 2 View  Result Date: 10/27/2021 CLINICAL DATA:  75 year old male with history of shortness of breath. Bilateral leg swelling. EXAM: CHEST - 2 VIEW COMPARISON:  Chest x-ray 09/16/2021. FINDINGS: Lung volumes are increased with emphysematous changes. No consolidative airspace disease. No pleural effusions. No pneumothorax. No pulmonary nodule or mass noted. Pulmonary vasculature and the  cardiomediastinal silhouette are within normal limits. Atherosclerosis in the thoracic aorta. Status post median sternotomy. IMPRESSION: 1. No radiographic evidence of acute cardiopulmonary disease. 2. Emphysema. 3. Aortic atherosclerosis. Electronically Signed   By: Vinnie Langton M.D.   On: 10/27/2021 19:57   ECHOCARDIOGRAM COMPLETE  Result Date: 10/28/2021    ECHOCARDIOGRAM REPORT   Patient Name:   Daniel Barker Date of Exam: 10/28/2021 Medical Rec #:  [id number]      Height:       66.5 in Accession #:    [id number]     Weight:       170.6 lb Date of Birth:  [birth year]     BSA:          1.880 m Patient Age:    56 years       BP:           148/71 mmHg Patient Gender: M              HR:           66 bpm. Exam Location:  [facility] Procedure: 2D Echo, Cardiac Doppler and Color Doppler Indications:    CHF-Acute Diastolic  History:        Patient has prior history of Echocardiogram examinations, most                 recent 06/06/2021. CHF, CAD, Prior CABG, COPD, Arrythmias:Atrial                 Fibrillation, Signs/Symptoms:Shortness of Breath; Risk                 Factors:Diabetes, Former Smoker and Dyslipidemia.  Sonographer:    Wenda Low Referring Phys: [id number] OLADAPO ADEFESO   Sonographer Comments: Image acquisition challenging due to respiratory motion. IMPRESSIONS  1. Left ventricular ejection fraction, by estimation, is 65 to 70%. The left ventricle has normal function. The left ventricle has no regional wall motion abnormalities. Left ventricular diastolic parameters are consistent with Grade II diastolic dysfunction (pseudonormalization). There is septal displacement with respiration, most likely in the setting of acute on chronic lung disease (COPD with hypoxic respiratory failure).  2. Right ventricular systolic function is normal. The right ventricular size is normal. Mildly increased right ventricular wall thickness. There is normal pulmonary artery systolic pressure. The estimated right ventricular systolic pressure is 81.8 mmHg.  3. Left atrial size was mildly dilated.  4. Right atrial size was moderately dilated.  5. The mitral valve is grossly normal. Trivial mitral valve regurgitation.  6. The aortic valve has an indeterminant number of cusps. There is moderate calcification of the aortic valve. Aortic valve regurgitation is not visualized. Aortic valve sclerosis/calcification is present, without any evidence of aortic stenosis. Aortic  valve mean gradient measures 5.0 mmHg.  7. The inferior vena cava is dilated in size with >50% respiratory variability, suggesting right atrial pressure of 8 mmHg. Comparison(s): No significant change from prior study. Prior images reviewed side by side. FINDINGS  Left Ventricle: Left ventricular ejection fraction, by estimation, is 65 to 70%. The left ventricle has normal function. The left ventricle has no regional wall motion abnormalities. The left ventricular internal cavity size was normal in size. There is  borderline left ventricular hypertrophy. Left ventricular diastolic parameters are consistent with Grade II diastolic dysfunction (pseudonormalization). Right Ventricle: The right ventricular size is normal. Mildly increased right  ventricular wall thickness. Right ventricular systolic function is normal. There is normal pulmonary artery systolic pressure. The tricuspid regurgitant  velocity is 2.46 m/s, and with an assumed right atrial pressure of 8 mmHg, the estimated right ventricular systolic pressure is 76.7 mmHg. Left Atrium: Left atrial size was mildly dilated. Right Atrium: Right atrial size was moderately dilated. Pericardium: There is no evidence of pericardial effusion. Presence of epicardial fat layer. Mitral Valve: The mitral valve is grossly normal. Trivial mitral valve regurgitation. MV peak gradient, 4.9 mmHg. The mean mitral valve gradient is 2.0 mmHg. Tricuspid Valve: The tricuspid valve is grossly normal. Tricuspid valve regurgitation is trivial. Aortic Valve: The aortic valve has an indeterminant number of cusps. There is moderate calcification of the aortic valve. There is mild aortic valve annular calcification. Aortic valve regurgitation is not visualized. Aortic valve sclerosis/calcification  is present, without any evidence of aortic stenosis. Aortic valve mean gradient measures 5.0 mmHg. Aortic valve peak gradient measures 13.0 mmHg. Aortic valve area, by VTI measures 2.45 cm. Pulmonic Valve: The pulmonic valve was not well visualized. Pulmonic valve regurgitation is trivial. Aorta: The aortic root is normal in size and structure. Venous: The inferior vena cava is dilated in size with greater than 50% respiratory variability, suggesting right atrial pressure of 8 mmHg. IAS/Shunts: No atrial level shunt detected by color flow Doppler.  LEFT VENTRICLE PLAX 2D LVIDd:         4.30 cm   Diastology LVIDs:         2.30 cm   LV e' medial:    7.51 cm/s LV PW:         1.10 cm   LV E/e' medial:  11.7 LV IVS:        1.00 cm   LV e' lateral:   6.09 cm/s LVOT diam:     2.00 cm   LV E/e' lateral: 14.4 LV SV:         97 LV SV Index:   52 LVOT Area:     3.14 cm  RIGHT VENTRICLE RV Basal diam:  3.95 cm RV Mid diam:    2.80 cm RV S  prime:     11.00 cm/s TAPSE (M-mode): 3.0 cm LEFT ATRIUM             Index        RIGHT ATRIUM           Index LA diam:        3.80 cm 2.02 cm/m   RA Area:     29.40 cm LA Vol (A2C):   68.1 ml 36.23 ml/m  RA Volume:   96.30 ml  51.23 ml/m LA Vol (A4C):   68.8 ml 36.60 ml/m LA Biplane Vol: 70.3 ml 37.40 ml/m  AORTIC VALVE                     PULMONIC VALVE AV Area (Vmax):    2.60 cm      PV Vmax:       0.93 m/s AV Area (Vmean):   2.42 cm      PV Peak grad:  3.5 mmHg AV Area (VTI):     2.45 cm AV Vmax:           180.00 cm/s AV Vmean:          103.000 cm/s AV VTI:            0.397 m AV Peak Grad:      13.0 mmHg AV Mean Grad:      5.0 mmHg LVOT Vmax:  149.00 cm/s LVOT Vmean:        79.200 cm/s LVOT VTI:          0.309 m LVOT/AV VTI ratio: 0.78  AORTA Ao Root diam: 3.10 cm MITRAL VALVE               TRICUSPID VALVE MV Area (PHT): 3.23 cm    TR Peak grad:   24.2 mmHg MV Area VTI:   2.41 cm    TR Vmax:        246.00 cm/s MV Peak grad:  4.9 mmHg MV Mean grad:  2.0 mmHg    SHUNTS MV Vmax:       1.11 m/s    Systemic VTI:  0.31 m MV Vmean:      59.7 cm/s   Systemic Diam: 2.00 cm MV Decel Time: 235 msec MV E velocity: 87.80 cm/s MV A velocity: 99.00 cm/s MV E/A ratio:  0.89 Rozann Lesches MD Electronically signed by Rozann Lesches MD Signature Date/Time: 10/28/2021/12:54:38 PM    Final      Scheduled Meds:  amLODipine  5 mg Oral Daily   azithromycin  500 mg Oral Daily   clopidogrel  75 mg Oral Daily   dextromethorphan-guaiFENesin  1 tablet Oral BID   enoxaparin (LOVENOX) injection  30 mg Subcutaneous Q24H   [START ON 10/29/2021] furosemide  40 mg Intravenous Daily   [START ON 10/29/2021] influenza vaccine adjuvanted  0.5 mL Intramuscular Tomorrow-1000   insulin aspart  0-5 Units Subcutaneous QHS   insulin aspart  0-9 Units Subcutaneous TID WC   ipratropium-albuterol  3 mL Nebulization QID   montelukast  10 mg Oral QHS   pantoprazole  40 mg Oral BID   predniSONE  10 mg Oral Daily   tamsulosin   0.4 mg Oral Daily   umeclidinium bromide  2 puff Inhalation Daily   Continuous Infusions:   LOS: 1 day    Roxan Hockey M.D on 10/28/2021 at 7:27 PM  Go to [email or web address] - for contact info  [facility] - Office  [phone]  If 7PM-7AM, please contact night-coverage [email or web address] Password [facility] 10/28/2021, 7:27 PM

## 2021-10-28 NOTE — Progress Notes (Signed)
*  PRELIMINARY RESULTS* Echocardiogram 2D Echocardiogram has been performed.  Daniel Barker 10/28/2021, 12:08 PM

## 2021-10-28 NOTE — Plan of Care (Signed)

## 2021-10-28 NOTE — ED Notes (Signed)
Assisted pt to the bathroom with 02 tank.

## 2021-10-29 ENCOUNTER — Other Ambulatory Visit (HOSPITAL_COMMUNITY): Payer: Medicare Other

## 2021-10-29 DIAGNOSIS — I5030 Unspecified diastolic (congestive) heart failure: Secondary | ICD-10-CM | POA: Diagnosis present

## 2021-10-29 LAB — BASIC METABOLIC PANEL
Anion gap: 7 (ref 5–15)
BUN: 47 mg/dL — ABNORMAL HIGH (ref 8–23)
CO2: 37 mmol/L — ABNORMAL HIGH (ref 22–32)
Calcium: 8.9 mg/dL (ref 8.9–10.3)
Chloride: 100 mmol/L (ref 98–111)
Creatinine, Ser: 1.73 mg/dL — ABNORMAL HIGH (ref 0.61–1.24)
GFR, Estimated: 41 mL/min — ABNORMAL LOW (ref >60)
Glucose, Bld: 115 mg/dL — ABNORMAL HIGH (ref 70–99)
Potassium: 3.3 mmol/L — ABNORMAL LOW (ref 3.5–5.1)
Sodium: 144 mmol/L (ref 135–145)

## 2021-10-29 LAB — GLUCOSE, CAPILLARY
Glucose-Capillary: 107 mg/dL — ABNORMAL HIGH (ref 70–99)
Glucose-Capillary: 142 mg/dL — ABNORMAL HIGH (ref 70–99)
Glucose-Capillary: 151 mg/dL — ABNORMAL HIGH (ref 70–99)
Glucose-Capillary: 197 mg/dL — ABNORMAL HIGH (ref 70–99)

## 2021-10-29 LAB — CBC
HCT: 32.7 % — ABNORMAL LOW (ref 39.0–52.0)
Hemoglobin: 10 g/dL — ABNORMAL LOW (ref 13.0–17.0)
MCH: 31.1 pg (ref 26.0–34.0)
MCHC: 30.6 g/dL (ref 30.0–36.0)
MCV: 101.6 fL — ABNORMAL HIGH (ref 80.0–100.0)
Platelets: 189 K/uL (ref 150–400)
RBC: 3.22 MIL/uL — ABNORMAL LOW (ref 4.22–5.81)
RDW: 14 % (ref 11.5–15.5)
WBC: 11.8 K/uL — ABNORMAL HIGH (ref 4.0–10.5)
nRBC: 0.2 % (ref 0.0–0.2)

## 2021-10-29 LAB — HEMOGLOBIN A1C
Hgb A1c MFr Bld: 6.4 % — ABNORMAL HIGH (ref 4.8–5.6)
Mean Plasma Glucose: 137 mg/dL

## 2021-10-29 MED ORDER — IPRATROPIUM-ALBUTEROL 0.5-2.5 (3) MG/3ML IN SOLN
3.0000 mL | Freq: Three times a day (TID) | RESPIRATORY_TRACT | Status: DC
  Administered 2021-10-30 (×2): 3 mL via RESPIRATORY_TRACT
  Filled 2021-10-29 (×2): qty 3

## 2021-10-29 MED ORDER — METHYLPREDNISOLONE SODIUM SUCC 40 MG IJ SOLR
40.0000 mg | Freq: Two times a day (BID) | INTRAMUSCULAR | Status: DC
  Administered 2021-10-29 – 2021-10-30 (×2): 40 mg via INTRAVENOUS
  Filled 2021-10-29 (×2): qty 1

## 2021-10-29 MED ORDER — ENOXAPARIN SODIUM 40 MG/0.4ML IJ SOSY
40.0000 mg | PREFILLED_SYRINGE | INTRAMUSCULAR | Status: DC
  Administered 2021-10-29 – 2021-10-30 (×2): 40 mg via SUBCUTANEOUS
  Filled 2021-10-29 (×2): qty 0.4

## 2021-10-29 MED ORDER — POTASSIUM CHLORIDE CRYS ER 20 MEQ PO TBCR
40.0000 meq | EXTENDED_RELEASE_TABLET | ORAL | Status: AC
  Administered 2021-10-29: 40 meq via ORAL
  Filled 2021-10-29: qty 2

## 2021-10-29 MED ORDER — IPRATROPIUM-ALBUTEROL 0.5-2.5 (3) MG/3ML IN SOLN
3.0000 mL | Freq: Four times a day (QID) | RESPIRATORY_TRACT | Status: DC
  Administered 2021-10-29 (×2): 3 mL via RESPIRATORY_TRACT
  Filled 2021-10-29 (×2): qty 3

## 2021-10-29 NOTE — Care Management Important Message (Signed)
Important Message  Patient Details  Name: Daniel Barker MRN: [id number] Date of Birth: 04/17/1946   Medicare Important Message Given:  Yes     [name] 10/29/2021, 1:45 PM

## 2021-10-29 NOTE — Progress Notes (Addendum)
PROGRESS NOTE     Daniel Barker, is a 75 y.o. male, DOB - 23-May-1946, UEA:[id number]  Admit date - 10/27/2021   Admitting Physician Bernadette Hoit, DO  Outpatient Primary MD for the patient is [name], MD  LOS - 2  Chief Complaint  Patient presents with   Leg Swelling        Brief Narrative:  75 y.o. 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 at 2 L/min, COPD, chronic diastolic heart failure, type 2 diabetes mellitus, chronic atrial fibrillation on anticoagulation, CKD stage III, hard of hearing admitted on 10/27/2021 with acute hypoxic respiratory failure secondary to CHF  Assessment & Plan:   Principal Problem:   Acute on chronic diastolic CHF (congestive heart failure) (HCC) Active Problems:   Chronic respiratory failure with hypoxia (HCC)   (HFpEF) heart failure with preserved ejection fraction - EF 65 to 70 % with Grade 2 DD   COPD (chronic obstructive pulmonary disease) (HCC)   Acute kidney injury superimposed on CKD (HCC)   Hypermagnesemia   1)HFpEF- acute on chronic diastolic dysfunction CHF exacerbation---  -shortness of breath at rest much improved -dyspnea on exertion and hypoxia persist -continue IV diuretics and supplemental oxygen --Echo with EF of 65 to 70% and grade 2 diastolic dysfunction  2) acute COPD exacerbation/emphysema---  cough, wheezing or shortness of breath at rest improving - dyspnea on exertion persist -Continue bronchodilators steroids and bronchodilators  3) chronic anemia of CKD--hgb stable -Consider ESA/Procrit  4)AKI----acute kidney injury on CKD stage -3B -Creatinine  2.32 >> 1.73 -baseline usually around 1.8 -renally adjust medications, avoid nephrotoxic agents / dehydration  / hypotension  5) steroid-induced hyperglycemia--- A1c was 5.9% -Use Novolog/Humalog Sliding scale insulin with Accu-Cheks/Fingersticks as ordered   6) acute on chronic hypoxic  respiratory failure--- secondary to #1 #12 -Continue oxygen supplementation  7)Generalized weakness and deconditioning--- anticipate possible discharge home with home health services, patient needs a rolling walker  8)Hypokalemia--- due to diuretics, replace and recheck  9)HTN--continue Amlodipine 5 mg daily  10)BPH--continue Flomax  Disposition/Need for in-Hospital Stay- patient unable to be discharged at this time due to --worsening hypoxia with respiratory failure secondary to CHF and COPD exacerbation -Anticipate discharge home  in a.m. if respiratory status continues to improve Status is: Inpatient  Remains inpatient appropriate because: Disposition above  Disposition: The patient is from: Home              Anticipated d/c is to: Home with [facility]              Anticipated d/c date is: 2 days              Patient currently is not medically stable to d/c. Barriers: Not Clinically Stable-   Code Status :  -  Code Status: Full Code   Family Communication:   (patient is alert, awake and coherent)  =-I called and updated patient's daughter [name] by phone  Consults  :  na  DVT Prophylaxis  :   - SCDs   enoxaparin (LOVENOX) injection 40 mg Start: 10/29/21 1000 SCDs Start: 10/28/21 0054  Lab Results  Component Value Date   PLT 189 10/29/2021   Inpatient Medications  Scheduled Meds:  amLODipine  5 mg Oral Daily   azithromycin  500 mg Oral Daily   clopidogrel  75 mg Oral Daily   dextromethorphan-guaiFENesin  1 tablet Oral BID   enoxaparin (LOVENOX) injection  40 mg Subcutaneous Q24H  furosemide  40 mg Intravenous Daily   insulin aspart  0-5 Units Subcutaneous QHS   insulin aspart  0-9 Units Subcutaneous TID WC   ipratropium-albuterol  3 mL Nebulization Q6H   montelukast  10 mg Oral QHS   pantoprazole  40 mg Oral BID   predniSONE  10 mg Oral Daily   tamsulosin  0.4 mg Oral Daily   umeclidinium bromide  2 puff Inhalation Daily   Continuous Infusions: PRN  Meds:.acetaminophen **OR** acetaminophen, albuterol, guaiFENesin-dextromethorphan   Anti-infectives (From admission, onward)    Start     Dose/Rate Route Frequency Ordered Stop   10/28/21 0945  azithromycin (ZITHROMAX) tablet 500 mg        500 mg Oral Daily 10/28/21 0856          Subjective: [name] today has no fevers, no emesis,  No chest pain,   , Shortness of breath at rest, dyspnea on exertion and hypoxia persist - Objective: Vitals:   10/29/21 0504 10/29/21 0755 10/29/21 1322 10/29/21 1410  BP: (!) 129/56  125/61   Pulse: (!) 57  60   Resp: 19  20   Temp: 98.6 F (37 C)  98 F (36.7 C)   TempSrc:   Oral   SpO2: 98% 94% 98% 97%  Weight: 77.5 kg     Height:        Intake/Output Summary (Last 24 hours) at 10/29/2021 1741 Last data filed at 10/29/2021 1323 Gross per 24 hour  Intake 480 ml  Output 1825 ml  Net -1345 ml   Filed Weights   10/27/21 1908 10/28/21 0815 10/29/21 0504  Weight: 77.6 kg 77.4 kg 77.5 kg     Physical Exam  Gen:- Awake Alert, positional dyspnea HEENT:- [place].AT, No sclera icterus Nose- [place] 2L/min Neck-Supple Neck,No JVD,.  Lungs-diminished breath sounds, scattered wheezes  CV- S1, S2 normal, regular  Abd-  +ve B.Sounds, Abd Soft, No tenderness,    Extremity/Skin:- 1 + pitting edema, pedal pulses present  Psych-affect is appropriate, oriented x3 Neuro-generalized weakness, no new focal deficits, no tremors  Data Reviewed: I have personally reviewed following labs and imaging studies  CBC: Recent Labs  Lab 10/27/21 1951 10/28/21 0353 10/29/21 0437  WBC 11.3* 10.6* 11.8*  NEUTROABS 9.2*  --   --   HGB 10.4* 10.8* 10.0*  HCT 35.7* 35.3* 32.7*  MCV 103.8* 102.3* 101.6*  PLT 197 179 941   Basic Metabolic Panel: Recent Labs  Lab 10/27/21 1951 10/28/21 0353 10/29/21 0437  NA 141 144 144  K 3.8 4.0 3.3*  CL 101 100 100  CO2 33* 37* 37*  GLUCOSE 135* 222* 115*  BUN 61* 54* 47*  CREATININE 2.32* 2.07* 1.73*  CALCIUM 9.3  9.3 8.9  MG 2.5* 2.4  --   PHOS  --  3.5  --    GFR: Estimated Creatinine Clearance: 33.9 mL/min (A) (by C-G formula based on SCr of 1.73 mg/dL (H)). Liver Function Tests: Recent Labs  Lab 10/27/21 1951 10/28/21 0353  AST 15 16  ALT 19 18  ALKPHOS 29* 28*  BILITOT 0.4 0.6  PROT 6.1* 6.1*  ALBUMIN 3.7 3.5   No results for input(s): LIPASE, AMYLASE in the last 168 hours. No results for input(s): AMMONIA in the last 168 hours. Coagulation Profile: No results for input(s): INR, PROTIME in the last 168 hours. Cardiac Enzymes: No results for input(s): CKTOTAL, CKMB, CKMBINDEX, TROPONINI in the last 168 hours. BNP (last 3 results) No results for input(s): PROBNP in  the last 8760 hours. HbA1C: Recent Labs    10/28/21 0353  HGBA1C 6.4*   CBG: Recent Labs  Lab 10/28/21 1559 10/28/21 1954 10/29/21 0740 10/29/21 1132 10/29/21 1557  GLUCAP 148* 179* 107* 142* 151*   Lipid Profile: No results for input(s): CHOL, HDL, LDLCALC, TRIG, CHOLHDL, LDLDIRECT in the last 72 hours. Thyroid Function Tests: No results for input(s): TSH, T4TOTAL, FREET4, T3FREE, THYROIDAB in the last 72 hours. Anemia Panel: No results for input(s): VITAMINB12, FOLATE, FERRITIN, TIBC, IRON, RETICCTPCT in the last 72 hours. Urine analysis:    Component Value Date/Time   COLORURINE YELLOW 07/17/2021 [place] 07/17/2021 1614   LABSPEC 1.015 07/17/2021 1614   PHURINE 5.0 07/17/2021 1614   GLUCOSEU NEGATIVE 07/17/2021 1614   HGBUR NEGATIVE 07/17/2021 1614   [place] 07/17/2021 [place] 07/17/2021 1614   PROTEINUR 100 (A) 07/17/2021 1614   NITRITE NEGATIVE 07/17/2021 1614   LEUKOCYTESUR NEGATIVE 07/17/2021 1614   Sepsis Labs: @LABRCNTIP (procalcitonin:4,lacticidven:4)  ) Recent Results (from the past 240 hour(s))  Resp Panel by RT-PCR (Flu A&B, Covid) Nasopharyngeal Swab     Status: None   Collection Time: 10/27/21  7:45 PM   Specimen: Nasopharyngeal  Swab; Nasopharyngeal(NP) swabs in vial transport medium  Result Value Ref Range Status   SARS Coronavirus 2 by RT PCR NEGATIVE NEGATIVE Final    Comment: (NOTE) SARS-CoV-2 target nucleic acids are NOT DETECTED.  The SARS-CoV-2 RNA is generally detectable in upper respiratory specimens during the acute phase of infection. The lowest concentration of SARS-CoV-2 viral copies this assay can detect is 138 copies/mL. A negative result does not preclude SARS-Cov-2 infection and should not be used as the sole basis for treatment or other patient management decisions. A negative result may occur with  improper specimen collection/handling, submission of specimen other than nasopharyngeal swab, presence of viral mutation(s) within the areas targeted by this assay, and inadequate number of viral copies(<138 copies/mL). A negative result must be combined with clinical observations, patient history, and epidemiological information. The expected result is Negative.  Fact Sheet for Patients:  EntrepreneurPulse.com.au  Fact Sheet for Healthcare Providers:  IncredibleEmployment.be  This test is no t yet approved or cleared by the [place] FDA and  has been authorized for detection and/or diagnosis of SARS-CoV-2 by FDA under an Emergency Use Authorization (EUA). This EUA will remain  in effect (meaning this test can be used) for the duration of the COVID-19 declaration under Section 564(b)(1) of the Act, 21 U.S.C.section 360bbb-3(b)(1), unless the authorization is terminated  or revoked sooner.       Influenza A by PCR NEGATIVE NEGATIVE Final   Influenza B by PCR NEGATIVE NEGATIVE Final    Comment: (NOTE) The Xpert Xpress SARS-CoV-2/FLU/RSV plus assay is intended as an aid in the diagnosis of influenza from Nasopharyngeal swab specimens and should not be used as a sole basis for treatment. Nasal washings and aspirates are unacceptable for Xpert Xpress  SARS-CoV-2/FLU/RSV testing.  Fact Sheet for Patients: EntrepreneurPulse.com.au  Fact Sheet for Healthcare Providers: IncredibleEmployment.be  This test is not yet approved or cleared by the [place] FDA and has been authorized for detection and/or diagnosis of SARS-CoV-2 by FDA under an Emergency Use Authorization (EUA). This EUA will remain in effect (meaning this test can be used) for the duration of the COVID-19 declaration under Section 564(b)(1) of the Act, 21 U.S.C. section 360bbb-3(b)(1), unless the authorization is terminated or revoked.  Performed at [facility], [phone]  [address] [place], [place] [address]       Radiology Studies: DG Chest 2 View  Result Date: 10/27/2021 CLINICAL DATA:  75 year old male with history of shortness of breath. Bilateral leg swelling. EXAM: CHEST - 2 VIEW COMPARISON:  Chest x-ray 09/16/2021. FINDINGS: Lung volumes are increased with emphysematous changes. No consolidative airspace disease. No pleural effusions. No pneumothorax. No pulmonary nodule or mass noted. Pulmonary vasculature and the cardiomediastinal silhouette are within normal limits. Atherosclerosis in the thoracic aorta. Status post median sternotomy. IMPRESSION: 1. No radiographic evidence of acute cardiopulmonary disease. 2. Emphysema. 3. Aortic atherosclerosis. Electronically Signed   By: Vinnie Langton M.D.   On: 10/27/2021 19:57   ECHOCARDIOGRAM COMPLETE  Result Date: 10/28/2021    ECHOCARDIOGRAM REPORT   Patient Name:   Daniel Barker Date of Exam: 10/28/2021 Medical Rec #:  [id number]      Height:       66.5 in Accession #:    [id number]     Weight:       170.6 lb Date of Birth:  Dec 23, 1945     BSA:          1.880 m Patient Age:    50 years       BP:           148/71 mmHg Patient Gender: M              HR:           66 bpm. Exam Location:  [facility] Procedure: 2D Echo, Cardiac Doppler and Color Doppler Indications:    CHF-Acute Diastolic   History:        Patient has prior history of Echocardiogram examinations, most                 recent 06/06/2021. CHF, CAD, Prior CABG, COPD, Arrythmias:Atrial                 Fibrillation, Signs/Symptoms:Shortness of Breath; Risk                 Factors:Diabetes, Former Smoker and Dyslipidemia.  Sonographer:    Wenda Low Referring Phys: [id number] OLADAPO ADEFESO  Sonographer Comments: Image acquisition challenging due to respiratory motion. IMPRESSIONS  1. Left ventricular ejection fraction, by estimation, is 65 to 70%. The left ventricle has normal function. The left ventricle has no regional wall motion abnormalities. Left ventricular diastolic parameters are consistent with Grade II diastolic dysfunction (pseudonormalization). There is septal displacement with respiration, most likely in the setting of acute on chronic lung disease (COPD with hypoxic respiratory failure).  2. Right ventricular systolic function is normal. The right ventricular size is normal. Mildly increased right ventricular wall thickness. There is normal pulmonary artery systolic pressure. The estimated right ventricular systolic pressure is 49.2 mmHg.  3. Left atrial size was mildly dilated.  4. Right atrial size was moderately dilated.  5. The mitral valve is grossly normal. Trivial mitral valve regurgitation.  6. The aortic valve has an indeterminant number of cusps. There is moderate calcification of the aortic valve. Aortic valve regurgitation is not visualized. Aortic valve sclerosis/calcification is present, without any evidence of aortic stenosis. Aortic  valve mean gradient measures 5.0 mmHg.  7. The inferior vena cava is dilated in size with >50% respiratory variability, suggesting right atrial pressure of 8 mmHg. Comparison(s): No significant change from prior study. Prior images reviewed side by side. FINDINGS  Left Ventricle: Left ventricular ejection fraction, by estimation, is 65 to 70%. The left ventricle  has normal  function. The left ventricle has no regional wall motion abnormalities. The left ventricular internal cavity size was normal in size. There is  borderline left ventricular hypertrophy. Left ventricular diastolic parameters are consistent with Grade II diastolic dysfunction (pseudonormalization). Right Ventricle: The right ventricular size is normal. Mildly increased right ventricular wall thickness. Right ventricular systolic function is normal. There is normal pulmonary artery systolic pressure. The tricuspid regurgitant velocity is 2.46 m/s, and with an assumed right atrial pressure of 8 mmHg, the estimated right ventricular systolic pressure is 43.3 mmHg. Left Atrium: Left atrial size was mildly dilated. Right Atrium: Right atrial size was moderately dilated. Pericardium: There is no evidence of pericardial effusion. Presence of epicardial fat layer. Mitral Valve: The mitral valve is grossly normal. Trivial mitral valve regurgitation. MV peak gradient, 4.9 mmHg. The mean mitral valve gradient is 2.0 mmHg. Tricuspid Valve: The tricuspid valve is grossly normal. Tricuspid valve regurgitation is trivial. Aortic Valve: The aortic valve has an indeterminant number of cusps. There is moderate calcification of the aortic valve. There is mild aortic valve annular calcification. Aortic valve regurgitation is not visualized. Aortic valve sclerosis/calcification  is present, without any evidence of aortic stenosis. Aortic valve mean gradient measures 5.0 mmHg. Aortic valve peak gradient measures 13.0 mmHg. Aortic valve area, by VTI measures 2.45 cm. Pulmonic Valve: The pulmonic valve was not well visualized. Pulmonic valve regurgitation is trivial. Aorta: The aortic root is normal in size and structure. Venous: The inferior vena cava is dilated in size with greater than 50% respiratory variability, suggesting right atrial pressure of 8 mmHg. IAS/Shunts: No atrial level shunt detected by color flow Doppler.  LEFT VENTRICLE  PLAX 2D LVIDd:         4.30 cm   Diastology LVIDs:         2.30 cm   LV e' medial:    7.51 cm/s LV PW:         1.10 cm   LV E/e' medial:  11.7 LV IVS:        1.00 cm   LV e' lateral:   6.09 cm/s LVOT diam:     2.00 cm   LV E/e' lateral: 14.4 LV SV:         97 LV SV Index:   52 LVOT Area:     3.14 cm  RIGHT VENTRICLE RV Basal diam:  3.95 cm RV Mid diam:    2.80 cm RV S prime:     11.00 cm/s TAPSE (M-mode): 3.0 cm LEFT ATRIUM             Index        RIGHT ATRIUM           Index LA diam:        3.80 cm 2.02 cm/m   RA Area:     29.40 cm LA Vol (A2C):   68.1 ml 36.23 ml/m  RA Volume:   96.30 ml  51.23 ml/m LA Vol (A4C):   68.8 ml 36.60 ml/m LA Biplane Vol: 70.3 ml 37.40 ml/m  AORTIC VALVE                     PULMONIC VALVE AV Area (Vmax):    2.60 cm      PV Vmax:       0.93 m/s AV Area (Vmean):   2.42 cm      PV Peak grad:  3.5 mmHg AV Area (VTI):     2.45  cm AV Vmax:           180.00 cm/s AV Vmean:          103.000 cm/s AV VTI:            0.397 m AV Peak Grad:      13.0 mmHg AV Mean Grad:      5.0 mmHg LVOT Vmax:         149.00 cm/s LVOT Vmean:        79.200 cm/s LVOT VTI:          0.309 m LVOT/AV VTI ratio: 0.78  AORTA Ao Root diam: 3.10 cm MITRAL VALVE               TRICUSPID VALVE MV Area (PHT): 3.23 cm    TR Peak grad:   24.2 mmHg MV Area VTI:   2.41 cm    TR Vmax:        246.00 cm/s MV Peak grad:  4.9 mmHg MV Mean grad:  2.0 mmHg    SHUNTS MV Vmax:       1.11 m/s    Systemic VTI:  0.31 m MV Vmean:      59.7 cm/s   Systemic Diam: 2.00 cm MV Decel Time: 235 msec MV E velocity: 87.80 cm/s MV A velocity: 99.00 cm/s MV E/A ratio:  0.89 Rozann Lesches MD Electronically signed by Rozann Lesches MD Signature Date/Time: 10/28/2021/12:54:38 PM    Final      Scheduled Meds:  amLODipine  5 mg Oral Daily   azithromycin  500 mg Oral Daily   clopidogrel  75 mg Oral Daily   dextromethorphan-guaiFENesin  1 tablet Oral BID   enoxaparin (LOVENOX) injection  40 mg Subcutaneous Q24H   furosemide  40 mg  Intravenous Daily   insulin aspart  0-5 Units Subcutaneous QHS   insulin aspart  0-9 Units Subcutaneous TID WC   ipratropium-albuterol  3 mL Nebulization Q6H   montelukast  10 mg Oral QHS   pantoprazole  40 mg Oral BID   predniSONE  10 mg Oral Daily   tamsulosin  0.4 mg Oral Daily   umeclidinium bromide  2 puff Inhalation Daily   Continuous Infusions:   LOS: 2 days   Roxan Hockey M.D on 10/29/2021 at 5:41 PM  Go to [email or web address] - for contact info  [facility] - Office  [phone]  If 7PM-7AM, please contact night-coverage [email or web address] Password [facility] 10/29/2021, 5:41 PM

## 2021-10-30 LAB — CBC
HCT: 36.1 % — ABNORMAL LOW (ref 39.0–52.0)
Hemoglobin: 11 g/dL — ABNORMAL LOW (ref 13.0–17.0)
MCH: 31.4 pg (ref 26.0–34.0)
MCHC: 30.5 g/dL (ref 30.0–36.0)
MCV: 103.1 fL — ABNORMAL HIGH (ref 80.0–100.0)
Platelets: 181 10*3/uL (ref 150–400)
RBC: 3.5 MIL/uL — ABNORMAL LOW (ref 4.22–5.81)
RDW: 14.2 % (ref 11.5–15.5)
WBC: 9.8 10*3/uL (ref 4.0–10.5)
nRBC: 0.2 % (ref 0.0–0.2)

## 2021-10-30 LAB — GLUCOSE, CAPILLARY
Glucose-Capillary: 160 mg/dL — ABNORMAL HIGH (ref 70–99)
Glucose-Capillary: 258 mg/dL — ABNORMAL HIGH (ref 70–99)

## 2021-10-30 LAB — BASIC METABOLIC PANEL
Anion gap: 6 (ref 5–15)
BUN: 39 mg/dL — ABNORMAL HIGH (ref 8–23)
CO2: 37 mmol/L — ABNORMAL HIGH (ref 22–32)
Calcium: 8.9 mg/dL (ref 8.9–10.3)
Chloride: 101 mmol/L (ref 98–111)
Creatinine, Ser: 1.68 mg/dL — ABNORMAL HIGH (ref 0.61–1.24)
GFR, Estimated: 42 mL/min — ABNORMAL LOW (ref >60)
Glucose, Bld: 185 mg/dL — ABNORMAL HIGH (ref 70–99)
Potassium: 4.1 mmol/L (ref 3.5–5.1)
Sodium: 144 mmol/L (ref 135–145)

## 2021-10-30 MED ORDER — DM-GUAIFENESIN ER 30-600 MG PO TB12
1.0000 | ORAL_TABLET | Freq: Two times a day (BID) | ORAL | 1 refills | Status: DC
Start: 2021-10-30 — End: 2022-01-01

## 2021-10-30 MED ORDER — POTASSIUM CHLORIDE CRYS ER 20 MEQ PO TBCR: 20.0000 meq | EXTENDED_RELEASE_TABLET | Freq: Every day | ORAL | 1 refills | Status: DC

## 2021-10-30 MED ORDER — ALBUTEROL SULFATE HFA 108 (90 BASE) MCG/ACT IN AERS: 2.0000 | INHALATION_SPRAY | RESPIRATORY_TRACT | 3 refills | Status: DC | PRN

## 2021-10-30 MED ORDER — TORSEMIDE 20 MG PO TABS: 20.0000 mg | ORAL_TABLET | ORAL | 1 refills | Status: AC

## 2021-10-30 MED ORDER — TAMSULOSIN HCL 0.4 MG PO CAPS: 0.4000 mg | ORAL_CAPSULE | Freq: Every day | ORAL | 11 refills | Status: AC

## 2021-10-30 MED ORDER — AZITHROMYCIN 500 MG PO TABS: 500.0000 mg | ORAL_TABLET | Freq: Every day | ORAL | 0 refills | Status: AC

## 2021-10-30 MED ORDER — CLOPIDOGREL BISULFATE 75 MG PO TABS: 75.0000 mg | ORAL_TABLET | Freq: Every day | ORAL | 3 refills | Status: AC

## 2021-10-30 MED ORDER — ALBUTEROL SULFATE (2.5 MG/3ML) 0.083% IN NEBU: 2.5000 mg | INHALATION_SOLUTION | Freq: Four times a day (QID) | RESPIRATORY_TRACT | 1 refills | Status: DC | PRN

## 2021-10-30 MED ORDER — BUDESONIDE-FORMOTEROL FUMARATE 160-4.5 MCG/ACT IN AERO
INHALATION_SPRAY | RESPIRATORY_TRACT | 11 refills | Status: DC
Start: 2021-10-30 — End: 2021-11-10

## 2021-10-30 MED ORDER — SPIRIVA RESPIMAT 2.5 MCG/ACT IN AERS: 2.0000 | INHALATION_SPRAY | Freq: Every day | RESPIRATORY_TRACT | 3 refills | Status: AC

## 2021-10-30 MED ORDER — AMLODIPINE BESYLATE 5 MG PO TABS
5.0000 mg | ORAL_TABLET | Freq: Every day | ORAL | 3 refills | Status: AC
Start: 2021-10-30 — End: ?

## 2021-10-30 MED ORDER — COMPRESSOR/NEBULIZER MISC: 1.0000 [IU] | 0 refills | Status: AC | PRN

## 2021-10-30 MED ORDER — PANTOPRAZOLE SODIUM 40 MG PO TBEC: 40.0000 mg | DELAYED_RELEASE_TABLET | Freq: Two times a day (BID) | ORAL | 2 refills | Status: AC

## 2021-10-30 MED ORDER — PREDNISONE 20 MG PO TABS: 40.0000 mg | ORAL_TABLET | Freq: Every day | ORAL | 0 refills | Status: AC

## 2021-10-30 NOTE — Discharge Summary (Signed)
Daniel Barker, is a 75 y.o. male  DOB [birth year]  MRN [id number].  Admission date:  10/27/2021  Admitting Physician  Bernadette Hoit, DO  Discharge Date:  10/30/2021   Primary MD  [name], MD  Recommendations for primary care physician for things to follow:   1)Very low-salt diet advised 2)Weigh yourself daily, call if you gain more than 3 pounds in 1 day or more than 5 pounds in 1 week as your diuretic medications may need to be adjusted 3)Limit your Fluid  intake to no more than 60 ounces (1.8 Liters) per day 4)you need oxygen at home at 2 L via nasal cannula continuously while awake and while asleep--- smoking or having open fires around oxygen can cause fire, significant injury and death 5) okay to use nebulizer treatments as prescribed as needed for shortness of breath 6)Avoid ibuprofen/Advil/Aleve/Motrin/Goody Powders/Naproxen/BC powders/Meloxicam/Diclofenac/Indomethacin and other Nonsteroidal anti-inflammatory medications as these will make you more likely to bleed and can cause stomach ulcers, can also cause Kidney problems. 7)Repeat CBC and BMP Blood Test around Thursday 11/04/2021 with [name], MD   Admission Diagnosis  Acute exacerbation of CHF (congestive heart failure) ([place]) [I50.9]   Discharge Diagnosis  Acute exacerbation of CHF (congestive heart failure) ([place]) [I50.9]   Principal Problem:   Acute on chronic diastolic CHF (congestive heart failure) (HCC) Active Problems:   Chronic respiratory failure with hypoxia (HCC)   (HFpEF) heart failure with preserved ejection fraction - EF 65 to 70 % with Grade 2 DD   COPD (chronic obstructive pulmonary disease) (HCC)   Acute kidney injury superimposed on CKD (HCC)   Acute exacerbation of chronic obstructive airways disease ([place])   Hypermagnesemia      Past Medical History:  Diagnosis Date   Acute metabolic encephalopathy  11/29/5850   Asthma    Atrial fibrillation (HCC)    BPH (benign prostatic hyperplasia)    Cigarette smoker    CKD (chronic kidney disease)    COPD (chronic obstructive pulmonary disease) (HCC)    Coronary artery disease    Cough    High cholesterol    Hx of CABG    Hypercholesteremia    Hypertension    Localized edema    Stented coronary artery     Past Surgical History:  Procedure Laterality Date   CARDIAC SURGERY     CORONARY ARTERY BYPASS GRAFT        HPI  from the history and physical done on the day of admission:    Chief Complaint: Leg swelling   HPI: Daniel Barker is a 75 y.o. 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  He went to see his PCP would be for lab work and he was called yesterday and was asked to go to the ED for further evaluation and management due to worsening kidney function.  He denies chest pain, fever, chills, nausea, vomiting   ED Course:  In the emergency department, he was bradycardic and BP was soft at 103/38.  Work-up in the ED showed leukocytosis, macrocytic anemia, BNP 497, magnesium 2.5, BUN/creatinine 61/2.32 (baseline creatinine 1.3-1.8), hyperglycemia. Chest x-ray showed no radiographic evidence of acute cardiopulmonary disease. IV Lasix 40 mg x 1 and Solu-Medrol 125 were given.  Hospitalist was asked to admit patient for further evaluation and management.   Hospital Course:     Brief Narrative:  75 y.o. male with medical history significant for  chronic hypoxic and hypercapnic respiratory failure on home  oxygen at 2 L/min, COPD, chronic diastolic heart failure, type 2 diabetes mellitus, chronic atrial fibrillation on anticoagulation, CKD stage III, hard of hearing admitted on 10/27/2021 with acute hypoxic respiratory failure secondary to CHF   Assessment & Plan:   Principal Problem:   Acute on chronic diastolic CHF (congestive heart failure) (HCC) Active Problems:   Chronic respiratory failure with hypoxia (HCC)   (HFpEF) heart failure with preserved ejection fraction - EF 65 to 70 % with Grade 2 DD   COPD (chronic obstructive pulmonary disease) (HCC)   Acute kidney injury superimposed on CKD (HCC)   Hypermagnesemia     1)HFpEF- acute on chronic diastolic dysfunction CHF exacerbation---  -Dyspnea with exertion improved significantly -No further dyspnea at rest -Treated with IV diuretics and supplemental oxygen --Echo with EF of 65 to 70% and grade 2 diastolic dysfunction -Discharged on torsemide 20 mg every morning and 10 mg every afternoon (PTA patient was on torsemide 20 mg daily) -Oxygen requirement is back to baseline at 2 L/min continuously -Salt restriction advised Wt is 171>>170 >>167 -Fluid balance is not accurate as patient voids into the toilet bowl   2) acute COPD exacerbation/emphysema---  -No further wheezing, -Respiratory status improved overall -Treated with bronchodilators steroids and bronchodilators -Discharge on p.o. prednisone and bronchodilators   3) chronic anemia of CKD--hgb stable -Repeat CBC with PCP next week   4)AKI----acute kidney injury on CKD stage -3B -Creatinine  2.32 >> 1.73>> 1.68 -baseline usually around 1.8 -renally adjust medications, avoid nephrotoxic agents / dehydration  / hypotension   5) steroid-induced hyperglycemia--- A1c was 5.9% --Should resolve with steroid taper   6) acute on chronic hypoxic respiratory failure--- secondary to #1 #12 -Dyspnea with exertion improved significantly -No further dyspnea at rest -Oxygen  requirement is back to baseline at 2 L/min continuously   7)Generalized weakness and deconditioning---  discharge home with home health services, patient needs a rolling walker   8)Hypokalemia--- due to diuretics, continue potassium position while on torsemide  9)HTN--continue Amlodipine 5 mg daily   10)BPH--continue Flomax   Disposition/--- discharge home with home health services   Disposition: The patient is from: Home              Anticipated d/c is to: Home with [facility]               Code Status :  -  Code Status: Full Code    Family Communication:   (patient is alert, awake and coherent)  =-I called and updated patient's daughter Daniel Barker by phone   Discharge Condition: Stable  Follow UP   [facility] Follow up.  Why: PT RN        [name], MD. Schedule an appointment as soon as possible for a visit in 1 week(s).   Specialty: Internal Medicine Contact information: [address] [place] [facility] [address] [phone]         Josue Hector, MD .   Specialty: Cardiology Contact information: [phone] [place] [address] [phone]                Diet and Activity recommendation:  As advised  Discharge Instructions    Discharge Instructions     Call MD for:  difficulty breathing, headache or visual disturbances   Complete by: As directed    Call MD for:  persistant dizziness or light-headedness   Complete by: As directed    Call MD for:  persistant nausea and vomiting   Complete by: As directed    Call MD for:  temperature >100.4   Complete by: As directed    Diet - low sodium heart healthy   Complete by: As directed    Discharge instructions   Complete by: As directed    1)Very low-salt diet advised 2)Weigh yourself daily, call if you gain more than 3 pounds in 1 day or more than 5 pounds in 1 week as your diuretic medications may need to be adjusted 3)Limit your Fluid  intake  to no more than 60 ounces (1.8 Liters) per day 4)you need oxygen at home at 2 L via nasal cannula continuously while awake and while asleep--- smoking or having open fires around oxygen can cause fire, significant injury and death 5) okay to use nebulizer treatments as prescribed as needed for shortness of breath 6)Avoid ibuprofen/Advil/Aleve/Motrin/Goody Powders/Naproxen/BC powders/Meloxicam/Diclofenac/Indomethacin and other Nonsteroidal anti-inflammatory medications as these will make you more likely to bleed and can cause stomach ulcers, can also cause Kidney problems. 7)Repeat CBC and BMP Blood Test around Thursday 11/04/2021 with [name], MD   For home use only DME Nebulizer machine   Complete by: As directed    Patient needs a nebulizer to treat with the following condition: Dyspnea and respiratory abnormalities   Length of Need: Lifetime   Increase activity slowly   Complete by: As directed          Discharge Medications     Allergies as of 10/30/2021       Reactions   Lipitor [atorvastatin]    Unknown reaction        Medication List     STOP taking these medications    BREZTRI AEROSPHERE IN   fluticasone 50 MCG/ACT nasal spray Commonly known as: FLONASE       TAKE these medications    acetaminophen 500 MG tablet Commonly known as: TYLENOL Take 1,000 mg by mouth every 6 (six) hours as needed for mild pain.   albuterol 108 (90 Base) MCG/ACT inhaler Commonly known as: VENTOLIN HFA Inhale 2 puffs into the lungs every 4 (four) hours as needed. What changed:  how much to take reasons to take this   albuterol (2.5 MG/3ML) 0.083% nebulizer solution Commonly known as: PROVENTIL Take 3 mLs (2.5 mg total) by nebulization every 6 (six) hours as needed for shortness of breath or wheezing. What changed: See the new instructions.   amLODipine 5 MG tablet Commonly known as: NORVASC Take 1 tablet (5 mg total) by mouth daily.   azithromycin 500 MG  tablet Commonly known as: ZITHROMAX Take 1 tablet (500 mg total)  by mouth daily for 5 days. What changed:  medication strength how much to take   bisoprolol 5 MG tablet Commonly known as: ZEBETA Take 0.5 tablets (2.5 mg total) by mouth daily. HOLD FOR  HEART RATE LESS THAN 65 OR SBP LESS THAN 95   budesonide-formoterol 160-4.5 MCG/ACT inhaler Commonly known as: Symbicort Take 2 puffs first thing in am and then another 2 puffs about 12 hours later.   clopidogrel 75 MG tablet Commonly known as: PLAVIX Take 1 tablet (75 mg total) by mouth daily.   Compressor/Nebulizer Misc Inhale 1 Units into the lungs as needed.   dextromethorphan-guaiFENesin 30-600 MG 12hr tablet Commonly known as: MUCINEX DM Take 1 tablet by mouth 2 (two) times daily.   fenofibrate micronized 134 MG capsule Commonly known as: LOFIBRA Take 134 mg by mouth daily before breakfast.   montelukast 10 MG tablet Commonly known as: SINGULAIR Take 10 mg by mouth at bedtime.   pantoprazole 40 MG tablet Commonly known as: PROTONIX Take 1 tablet (40 mg total) by mouth 2 (two) times daily.   potassium chloride SA 20 MEQ tablet Commonly known as: KLOR-CON M Take 1 tablet (20 mEq total) by mouth daily. Take will taking Torsemide/Demadex Fluid Medicine What changed: additional instructions   predniSONE 20 MG tablet Commonly known as: DELTASONE Take 2 tablets (40 mg total) by mouth daily with breakfast for 5 days. What changed:  medication strength how much to take when to take this Another medication with the same name was removed. Continue taking this medication, and follow the directions you see here.   propafenone 225 MG tablet Commonly known as: RYTHMOL Take 225 mg by mouth every 8 (eight) hours.   Spiriva Respimat 2.5 MCG/ACT Aers Generic drug: Tiotropium Bromide Monohydrate Inhale 2 puffs into the lungs daily.   tamsulosin 0.4 MG Caps capsule Commonly known as: FLOMAX Take 1 capsule (0.4 mg total)  by mouth daily after supper. What changed: See the new instructions.   torsemide 20 MG tablet Commonly known as: Demadex Take 1 tablet (20 mg total) by mouth See admin instructions. Take 1 Tablet 20 mg qam and half tab (10 mg) every evening What changed:  when to take this additional instructions   zinc sulfate 220 (50 Zn) MG capsule Take 1 capsule (220 mg total) by mouth daily.               Durable Medical Equipment  (From admission, onward)           Start     Ordered   10/30/21 0000  For home use only DME Nebulizer machine       Question Answer Comment  Patient needs a nebulizer to treat with the following condition Dyspnea and respiratory abnormalities   Length of Need Lifetime      10/30/21 1358   10/29/21 1114  For home use only DME 4 wheeled rolling walker with seat  Once       Question:  Patient needs a walker to treat with the following condition  Answer:  Respiratory failure with hypoxia ([place])   10/29/21 1113            Major procedures and Radiology Reports - PLEASE review detailed and final reports for all details, in brief -   DG Chest 2 View  Result Date: 10/27/2021 CLINICAL DATA:  75 year old male with history of shortness of breath. Bilateral leg swelling. EXAM: CHEST - 2 VIEW COMPARISON:  Chest x-ray 09/16/2021. FINDINGS: Lung volumes are  increased with emphysematous changes. No consolidative airspace disease. No pleural effusions. No pneumothorax. No pulmonary nodule or mass noted. Pulmonary vasculature and the cardiomediastinal silhouette are within normal limits. Atherosclerosis in the thoracic aorta. Status post median sternotomy. IMPRESSION: 1. No radiographic evidence of acute cardiopulmonary disease. 2. Emphysema. 3. Aortic atherosclerosis. Electronically Signed   By: Vinnie Langton M.D.   On: 10/27/2021 19:57   ECHOCARDIOGRAM COMPLETE  Result Date: 10/28/2021    ECHOCARDIOGRAM REPORT   Patient Name:   Daniel Barker Date of Exam:  10/28/2021 Medical Rec #:  [id number]      Height:       66.5 in Accession #:    [id number]     Weight:       170.6 lb Date of Birth:  July 26, 1946     BSA:          1.880 m Patient Age:    71 years       BP:           148/71 mmHg Patient Gender: M              HR:           66 bpm. Exam Location:  [facility] Procedure: 2D Echo, Cardiac Doppler and Color Doppler Indications:    CHF-Acute Diastolic  History:        Patient has prior history of Echocardiogram examinations, most                 recent 06/06/2021. CHF, CAD, Prior CABG, COPD, Arrythmias:Atrial                 Fibrillation, Signs/Symptoms:Shortness of Breath; Risk                 Factors:Diabetes, Former Smoker and Dyslipidemia.  Sonographer:    Wenda Low Referring Phys: [id number] OLADAPO ADEFESO  Sonographer Comments: Image acquisition challenging due to respiratory motion. IMPRESSIONS  1. Left ventricular ejection fraction, by estimation, is 65 to 70%. The left ventricle has normal function. The left ventricle has no regional wall motion abnormalities. Left ventricular diastolic parameters are consistent with Grade II diastolic dysfunction (pseudonormalization). There is septal displacement with respiration, most likely in the setting of acute on chronic lung disease (COPD with hypoxic respiratory failure).  2. Right ventricular systolic function is normal. The right ventricular size is normal. Mildly increased right ventricular wall thickness. There is normal pulmonary artery systolic pressure. The estimated right ventricular systolic pressure is 28.7 mmHg.  3. Left atrial size was mildly dilated.  4. Right atrial size was moderately dilated.  5. The mitral valve is grossly normal. Trivial mitral valve regurgitation.  6. The aortic valve has an indeterminant number of cusps. There is moderate calcification of the aortic valve. Aortic valve regurgitation is not visualized. Aortic valve sclerosis/calcification is present, without any evidence of aortic  stenosis. Aortic  valve mean gradient measures 5.0 mmHg.  7. The inferior vena cava is dilated in size with >50% respiratory variability, suggesting right atrial pressure of 8 mmHg. Comparison(s): No significant change from prior study. Prior images reviewed side by side. FINDINGS  Left Ventricle: Left ventricular ejection fraction, by estimation, is 65 to 70%. The left ventricle has normal function. The left ventricle has no regional wall motion abnormalities. The left ventricular internal cavity size was normal in size. There is  borderline left ventricular hypertrophy. Left ventricular diastolic parameters are consistent with Grade II diastolic dysfunction (pseudonormalization). Right Ventricle: The right ventricular size is  normal. Mildly increased right ventricular wall thickness. Right ventricular systolic function is normal. There is normal pulmonary artery systolic pressure. The tricuspid regurgitant velocity is 2.46 m/s, and with an assumed right atrial pressure of 8 mmHg, the estimated right ventricular systolic pressure is 37.8 mmHg. Left Atrium: Left atrial size was mildly dilated. Right Atrium: Right atrial size was moderately dilated. Pericardium: There is no evidence of pericardial effusion. Presence of epicardial fat layer. Mitral Valve: The mitral valve is grossly normal. Trivial mitral valve regurgitation. MV peak gradient, 4.9 mmHg. The mean mitral valve gradient is 2.0 mmHg. Tricuspid Valve: The tricuspid valve is grossly normal. Tricuspid valve regurgitation is trivial. Aortic Valve: The aortic valve has an indeterminant number of cusps. There is moderate calcification of the aortic valve. There is mild aortic valve annular calcification. Aortic valve regurgitation is not visualized. Aortic valve sclerosis/calcification  is present, without any evidence of aortic stenosis. Aortic valve mean gradient measures 5.0 mmHg. Aortic valve peak gradient measures 13.0 mmHg. Aortic valve area, by VTI  measures 2.45 cm. Pulmonic Valve: The pulmonic valve was not well visualized. Pulmonic valve regurgitation is trivial. Aorta: The aortic root is normal in size and structure. Venous: The inferior vena cava is dilated in size with greater than 50% respiratory variability, suggesting right atrial pressure of 8 mmHg. IAS/Shunts: No atrial level shunt detected by color flow Doppler.  LEFT VENTRICLE PLAX 2D LVIDd:         4.30 cm   Diastology LVIDs:         2.30 cm   LV e' medial:    7.51 cm/s LV PW:         1.10 cm   LV E/e' medial:  11.7 LV IVS:        1.00 cm   LV e' lateral:   6.09 cm/s LVOT diam:     2.00 cm   LV E/e' lateral: 14.4 LV SV:         97 LV SV Index:   52 LVOT Area:     3.14 cm  RIGHT VENTRICLE RV Basal diam:  3.95 cm RV Mid diam:    2.80 cm RV S prime:     11.00 cm/s TAPSE (M-mode): 3.0 cm LEFT ATRIUM             Index        RIGHT ATRIUM           Index LA diam:        3.80 cm 2.02 cm/m   RA Area:     29.40 cm LA Vol (A2C):   68.1 ml 36.23 ml/m  RA Volume:   96.30 ml  51.23 ml/m LA Vol (A4C):   68.8 ml 36.60 ml/m LA Biplane Vol: 70.3 ml 37.40 ml/m  AORTIC VALVE                     PULMONIC VALVE AV Area (Vmax):    2.60 cm      PV Vmax:       0.93 m/s AV Area (Vmean):   2.42 cm      PV Peak grad:  3.5 mmHg AV Area (VTI):     2.45 cm AV Vmax:           180.00 cm/s AV Vmean:          103.000 cm/s AV VTI:            0.397 m AV Peak Grad:  13.0 mmHg AV Mean Grad:      5.0 mmHg LVOT Vmax:         149.00 cm/s LVOT Vmean:        79.200 cm/s LVOT VTI:          0.309 m LVOT/AV VTI ratio: 0.78  AORTA Ao Root diam: 3.10 cm MITRAL VALVE               TRICUSPID VALVE MV Area (PHT): 3.23 cm    TR Peak grad:   24.2 mmHg MV Area VTI:   2.41 cm    TR Vmax:        246.00 cm/s MV Peak grad:  4.9 mmHg MV Mean grad:  2.0 mmHg    SHUNTS MV Vmax:       1.11 m/s    Systemic VTI:  0.31 m MV Vmean:      59.7 cm/s   Systemic Diam: 2.00 cm MV Decel Time: 235 msec MV E velocity: 87.80 cm/s MV A velocity: 99.00 cm/s  MV E/A ratio:  0.89 Rozann Lesches MD Electronically signed by Rozann Lesches MD Signature Date/Time: 10/28/2021/12:54:38 PM    Final     Micro Results   Recent Results (from the past 240 hour(s))  Resp Panel by RT-PCR (Flu A&B, Covid) Nasopharyngeal Swab     Status: None   Collection Time: 10/27/21  7:45 PM   Specimen: Nasopharyngeal Swab; Nasopharyngeal(NP) swabs in vial transport medium  Result Value Ref Range Status   SARS Coronavirus 2 by RT PCR NEGATIVE NEGATIVE Final    Comment: (NOTE) SARS-CoV-2 target nucleic acids are NOT DETECTED.  The SARS-CoV-2 RNA is generally detectable in upper respiratory specimens during the acute phase of infection. The lowest concentration of SARS-CoV-2 viral copies this assay can detect is 138 copies/mL. A negative result does not preclude SARS-Cov-2 infection and should not be used as the sole basis for treatment or other patient management decisions. A negative result may occur with  improper specimen collection/handling, submission of specimen other than nasopharyngeal swab, presence of viral mutation(s) within the areas targeted by this assay, and inadequate number of viral copies(<138 copies/mL). A negative result must be combined with clinical observations, patient history, and epidemiological information. The expected result is Negative.  Fact Sheet for Patients:  EntrepreneurPulse.com.au  Fact Sheet for Healthcare Providers:  IncredibleEmployment.be  This test is no t yet approved or cleared by the [place] FDA and  has been authorized for detection and/or diagnosis of SARS-CoV-2 by FDA under an Emergency Use Authorization (EUA). This EUA will remain  in effect (meaning this test can be used) for the duration of the COVID-19 declaration under Section 564(b)(1) of the Act, 21 U.S.C.section 360bbb-3(b)(1), unless the authorization is terminated  or revoked sooner.       Influenza A by PCR  NEGATIVE NEGATIVE Final   Influenza B by PCR NEGATIVE NEGATIVE Final    Comment: (NOTE) The Xpert Xpress SARS-CoV-2/FLU/RSV plus assay is intended as an aid in the diagnosis of influenza from Nasopharyngeal swab specimens and should not be used as a sole basis for treatment. Nasal washings and aspirates are unacceptable for Xpert Xpress SARS-CoV-2/FLU/RSV testing.  Fact Sheet for Patients: EntrepreneurPulse.com.au  Fact Sheet for Healthcare Providers: IncredibleEmployment.be  This test is not yet approved or cleared by the [place] FDA and has been authorized for detection and/or diagnosis of SARS-CoV-2 by FDA under an Emergency Use Authorization (EUA). This EUA will remain in effect (meaning this test can  be used) for the duration of the COVID-19 declaration under Section 564(b)(1) of the Act, 21 U.S.C. section 360bbb-3(b)(1), unless the authorization is terminated or revoked.  Performed at [facility], [address] [place], [place] [address]     Today   Subjective    Daniel Barker today has no new complaints  -Dyspnea with exertion improved significantly -No further dyspnea at rest --Oxygen requirement is back to baseline at 2 L/min continuously  -Voiding well         Patient has been seen and examined prior to discharge   Objective   Blood pressure (!) 120/58, pulse 80, temperature 98.2 F (36.8 C), temperature source Oral, resp. rate 18, height 5' 6.5" (1.689 m), weight 75.8 kg, SpO2 97 %.   Intake/Output Summary (Last 24 hours) at 10/30/2021 1400 Last data filed at 10/30/2021 1300 Gross per 24 hour  Intake 960 ml  Output 2000 ml  Net -1040 ml    Exam Gen:- Awake Alert, no conversational dyspnea HEENT:- [place].AT, No sclera icterus Nose- [place] 2L/min Neck-Supple Neck,No JVD,.  Lungs-improved air movement, no wheezing or rales  CV- S1, S2 normal, regular  Abd-  +ve B.Sounds, Abd Soft, No tenderness,     Extremity/Skin:-Trace edema, pedal pulses present  Psych-affect is appropriate, oriented x3 Neuro-generalized weakness, no new focal deficits, no tremors   Data Review   CBC w Diff:  Lab Results  Component Value Date   WBC 9.8 10/30/2021   HGB 11.0 (L) 10/30/2021   HCT 36.1 (L) 10/30/2021   PLT 181 10/30/2021   LYMPHOPCT 10 10/27/2021   MONOPCT 5 10/27/2021   EOSPCT 0 10/27/2021   BASOPCT 1 10/27/2021    CMP:  Lab Results  Component Value Date   NA 144 10/30/2021   K 4.1 10/30/2021   CL 101 10/30/2021   CO2 37 (H) 10/30/2021   BUN 39 (H) 10/30/2021   CREATININE 1.68 (H) 10/30/2021   PROT 6.1 (L) 10/28/2021   ALBUMIN 3.5 10/28/2021   BILITOT 0.6 10/28/2021   ALKPHOS 28 (L) 10/28/2021   AST 16 10/28/2021   ALT 18 10/28/2021  .  Total Discharge time is about 33 minutes  Roxan Hockey M.D on 10/30/2021 at 2:00 PM  Go to [email or web address] -  for contact info  [facility] - Office  [phone]

## 2021-10-30 NOTE — Progress Notes (Signed)
Went over discharge instructions. Pt verbalized understanding. Pt discharged and taken to main entrance via wheelchair by this nurse.

## 2021-10-30 NOTE — TOC Transition Note (Addendum)
Transition of Care [facility]) - CM/SW Discharge Note   Patient Details  Name: Daniel Barker MRN: [id number] Date of Birth: 1946-11-26  Transition of Care [facility]) CM/SW Contact:  Natasha Bence, LCSW Phone Number: 10/30/2021, 12:56 PM   Clinical Narrative:    CSW notified of patient's readiness for discharge. CSW notified Lattie Haw with [employer]. Lattie Haw agreeable to provide services upon discharge. TOC signing off.  Addendum CSW notified patient of patient's DME need. CSW referred patient to [facility] with [employer] for nebulizer. Caryl Pina agreeable to provide nebulizer. TOC signing off.    Final next level of care: [place] Barriers to Discharge: Barriers Resolved   Patient Goals and CMS Choice Patient states their goals for this hospitalization and ongoing recovery are:: Return home with [facility] CMS Medicare.gov Compare Post Acute Care list provided to:: Patient Choice offered to / list presented to : Patient  Discharge Placement                    Patient and family notified of of transfer: 10/30/21  Discharge Plan and Services In-house Referral: Clinical Social Work Discharge Planning Services: CM Consult Post Acute Care Choice: Home Health, Durable Medical Equipment                    HH Arranged: RN, PT [facility] Agency: [place] Date [facility] Agency Contacted: 10/30/21 Time [place]: 1256 Representative spoke with at [place]: [place] ([place]) Interventions     Readmission Risk Interventions Readmission Risk Prevention Plan 10/28/2021 09/20/2021 08/12/2021  Transportation Screening Complete Complete Complete  PCP or Specialist Appt within 5-7 Days - - -  Home Care Screening - - -  Medication Review ([place]) Complete Complete Complete  PCP or Specialist appointment within 3-5 days of discharge - Complete -  [place] or Home Care Consult Complete Complete Complete  SW Recovery Care/Counseling Consult Complete - Complete   Palliative Care Screening Not Applicable - Not [place] Not Applicable - Not Applicable

## 2021-10-30 NOTE — Discharge Instructions (Signed)
1)Very low-salt diet advised 2)Weigh yourself daily, call if you gain more than 3 pounds in 1 day or more than 5 pounds in 1 week as your diuretic medications may need to be adjusted 3)Limit your Fluid  intake to no more than 60 ounces (1.8 Liters) per day 4)you need oxygen at home at 2 L via nasal cannula continuously while awake and while asleep--- smoking or having open fires around oxygen can cause fire, significant injury and death 5) okay to use nebulizer treatments as prescribed as needed for shortness of breath 6)Avoid ibuprofen/Advil/Aleve/Motrin/Goody Powders/Naproxen/BC powders/Meloxicam/Diclofenac/Indomethacin and other Nonsteroidal anti-inflammatory medications as these will make you more likely to bleed and can cause stomach ulcers, can also cause Kidney problems. 7)Repeat CBC and BMP Blood Test around Thursday 11/04/2021 with [name], MD

## 2021-11-09 DIAGNOSIS — I4891 Unspecified atrial fibrillation: Secondary | ICD-10-CM | POA: Diagnosis not present

## 2021-11-09 DIAGNOSIS — E782 Mixed hyperlipidemia: Secondary | ICD-10-CM | POA: Diagnosis not present

## 2021-11-09 DIAGNOSIS — N189 Chronic kidney disease, unspecified: Secondary | ICD-10-CM | POA: Diagnosis not present

## 2021-11-09 DIAGNOSIS — I509 Heart failure, unspecified: Secondary | ICD-10-CM | POA: Insufficient documentation

## 2021-11-09 DIAGNOSIS — I10 Essential (primary) hypertension: Secondary | ICD-10-CM | POA: Diagnosis not present

## 2021-11-09 DIAGNOSIS — Z131 Encounter for screening for diabetes mellitus: Secondary | ICD-10-CM | POA: Diagnosis not present

## 2021-11-09 DIAGNOSIS — J449 Chronic obstructive pulmonary disease, unspecified: Secondary | ICD-10-CM | POA: Diagnosis not present

## 2021-11-09 DIAGNOSIS — Z7409 Other reduced mobility: Secondary | ICD-10-CM | POA: Diagnosis not present

## 2021-11-10 ENCOUNTER — Other Ambulatory Visit: Payer: Self-pay | Admitting: Internal Medicine

## 2021-11-10 DIAGNOSIS — J449 Chronic obstructive pulmonary disease, unspecified: Secondary | ICD-10-CM

## 2021-11-11 DIAGNOSIS — R058 Other specified cough: Secondary | ICD-10-CM | POA: Insufficient documentation

## 2021-11-15 DIAGNOSIS — I10 Essential (primary) hypertension: Secondary | ICD-10-CM | POA: Diagnosis not present

## 2021-11-15 DIAGNOSIS — J441 Chronic obstructive pulmonary disease with (acute) exacerbation: Secondary | ICD-10-CM | POA: Diagnosis not present

## 2021-11-15 DIAGNOSIS — E1122 Type 2 diabetes mellitus with diabetic chronic kidney disease: Secondary | ICD-10-CM | POA: Diagnosis not present

## 2021-11-15 DIAGNOSIS — E782 Mixed hyperlipidemia: Secondary | ICD-10-CM | POA: Diagnosis not present

## 2021-11-15 DIAGNOSIS — J069 Acute upper respiratory infection, unspecified: Secondary | ICD-10-CM | POA: Insufficient documentation

## 2021-11-15 DIAGNOSIS — I4891 Unspecified atrial fibrillation: Secondary | ICD-10-CM | POA: Diagnosis not present

## 2021-11-15 DIAGNOSIS — J9621 Acute and chronic respiratory failure with hypoxia: Secondary | ICD-10-CM | POA: Diagnosis not present

## 2021-11-15 DIAGNOSIS — I5032 Chronic diastolic (congestive) heart failure: Secondary | ICD-10-CM | POA: Diagnosis not present

## 2021-11-15 DIAGNOSIS — N189 Chronic kidney disease, unspecified: Secondary | ICD-10-CM | POA: Diagnosis not present

## 2021-11-15 DIAGNOSIS — Z9981 Dependence on supplemental oxygen: Secondary | ICD-10-CM | POA: Diagnosis not present

## 2021-11-15 DIAGNOSIS — I509 Heart failure, unspecified: Secondary | ICD-10-CM | POA: Diagnosis not present

## 2021-11-15 DIAGNOSIS — Z7409 Other reduced mobility: Secondary | ICD-10-CM | POA: Diagnosis not present

## 2021-11-15 DIAGNOSIS — J449 Chronic obstructive pulmonary disease, unspecified: Secondary | ICD-10-CM | POA: Diagnosis not present

## 2021-11-15 DIAGNOSIS — N183 Chronic kidney disease, stage 3 unspecified: Secondary | ICD-10-CM | POA: Diagnosis not present

## 2021-11-15 DIAGNOSIS — J9622 Acute and chronic respiratory failure with hypercapnia: Secondary | ICD-10-CM | POA: Diagnosis not present

## 2021-11-19 DIAGNOSIS — I509 Heart failure, unspecified: Secondary | ICD-10-CM | POA: Diagnosis not present

## 2021-11-19 DIAGNOSIS — Z9981 Dependence on supplemental oxygen: Secondary | ICD-10-CM | POA: Diagnosis not present

## 2021-11-19 DIAGNOSIS — I10 Essential (primary) hypertension: Secondary | ICD-10-CM | POA: Diagnosis not present

## 2021-11-19 DIAGNOSIS — N189 Chronic kidney disease, unspecified: Secondary | ICD-10-CM | POA: Diagnosis not present

## 2021-11-19 DIAGNOSIS — I4891 Unspecified atrial fibrillation: Secondary | ICD-10-CM | POA: Diagnosis not present

## 2021-11-19 DIAGNOSIS — J449 Chronic obstructive pulmonary disease, unspecified: Secondary | ICD-10-CM | POA: Diagnosis not present

## 2021-11-19 DIAGNOSIS — E1122 Type 2 diabetes mellitus with diabetic chronic kidney disease: Secondary | ICD-10-CM | POA: Diagnosis not present

## 2021-11-19 DIAGNOSIS — E782 Mixed hyperlipidemia: Secondary | ICD-10-CM | POA: Diagnosis not present

## 2021-11-19 DIAGNOSIS — Z7409 Other reduced mobility: Secondary | ICD-10-CM | POA: Diagnosis not present

## 2021-11-20 DIAGNOSIS — J449 Chronic obstructive pulmonary disease, unspecified: Secondary | ICD-10-CM | POA: Diagnosis not present

## 2021-11-23 DIAGNOSIS — I509 Heart failure, unspecified: Secondary | ICD-10-CM | POA: Diagnosis not present

## 2021-11-23 DIAGNOSIS — J9611 Chronic respiratory failure with hypoxia: Secondary | ICD-10-CM | POA: Diagnosis not present

## 2021-11-23 DIAGNOSIS — J9612 Chronic respiratory failure with hypercapnia: Secondary | ICD-10-CM | POA: Diagnosis not present

## 2021-11-23 DIAGNOSIS — Z7409 Other reduced mobility: Secondary | ICD-10-CM | POA: Diagnosis not present

## 2021-11-23 DIAGNOSIS — Z9981 Dependence on supplemental oxygen: Secondary | ICD-10-CM | POA: Diagnosis not present

## 2021-11-23 DIAGNOSIS — N189 Chronic kidney disease, unspecified: Secondary | ICD-10-CM | POA: Diagnosis not present

## 2021-11-23 DIAGNOSIS — J449 Chronic obstructive pulmonary disease, unspecified: Secondary | ICD-10-CM | POA: Diagnosis not present

## 2021-11-23 DIAGNOSIS — I4891 Unspecified atrial fibrillation: Secondary | ICD-10-CM | POA: Diagnosis not present

## 2021-11-23 DIAGNOSIS — I5030 Unspecified diastolic (congestive) heart failure: Secondary | ICD-10-CM | POA: Diagnosis not present

## 2021-11-23 DIAGNOSIS — E782 Mixed hyperlipidemia: Secondary | ICD-10-CM | POA: Diagnosis not present

## 2021-11-23 DIAGNOSIS — I10 Essential (primary) hypertension: Secondary | ICD-10-CM | POA: Diagnosis not present

## 2021-11-23 DIAGNOSIS — Z515 Encounter for palliative care: Secondary | ICD-10-CM | POA: Diagnosis not present

## 2021-11-23 DIAGNOSIS — E1122 Type 2 diabetes mellitus with diabetic chronic kidney disease: Secondary | ICD-10-CM | POA: Diagnosis not present

## 2021-11-23 DIAGNOSIS — N1832 Chronic kidney disease, stage 3b: Secondary | ICD-10-CM | POA: Diagnosis not present

## 2021-11-26 DIAGNOSIS — N189 Chronic kidney disease, unspecified: Secondary | ICD-10-CM | POA: Diagnosis not present

## 2021-11-26 DIAGNOSIS — I10 Essential (primary) hypertension: Secondary | ICD-10-CM | POA: Diagnosis not present

## 2021-11-26 DIAGNOSIS — E1122 Type 2 diabetes mellitus with diabetic chronic kidney disease: Secondary | ICD-10-CM | POA: Diagnosis not present

## 2021-11-26 DIAGNOSIS — Z7409 Other reduced mobility: Secondary | ICD-10-CM | POA: Diagnosis not present

## 2021-11-26 DIAGNOSIS — E782 Mixed hyperlipidemia: Secondary | ICD-10-CM | POA: Diagnosis not present

## 2021-11-26 DIAGNOSIS — Z9981 Dependence on supplemental oxygen: Secondary | ICD-10-CM | POA: Diagnosis not present

## 2021-11-26 DIAGNOSIS — I4891 Unspecified atrial fibrillation: Secondary | ICD-10-CM | POA: Diagnosis not present

## 2021-11-26 DIAGNOSIS — J449 Chronic obstructive pulmonary disease, unspecified: Secondary | ICD-10-CM | POA: Diagnosis not present

## 2021-11-26 DIAGNOSIS — I509 Heart failure, unspecified: Secondary | ICD-10-CM | POA: Diagnosis not present

## 2021-12-02 ENCOUNTER — Other Ambulatory Visit: Payer: Self-pay | Admitting: Internal Medicine

## 2021-12-02 DIAGNOSIS — I509 Heart failure, unspecified: Secondary | ICD-10-CM | POA: Diagnosis not present

## 2021-12-02 DIAGNOSIS — Z7409 Other reduced mobility: Secondary | ICD-10-CM | POA: Diagnosis not present

## 2021-12-02 DIAGNOSIS — I4891 Unspecified atrial fibrillation: Secondary | ICD-10-CM | POA: Diagnosis not present

## 2021-12-02 DIAGNOSIS — J449 Chronic obstructive pulmonary disease, unspecified: Secondary | ICD-10-CM | POA: Diagnosis not present

## 2021-12-02 DIAGNOSIS — N189 Chronic kidney disease, unspecified: Secondary | ICD-10-CM | POA: Diagnosis not present

## 2021-12-02 DIAGNOSIS — E782 Mixed hyperlipidemia: Secondary | ICD-10-CM | POA: Diagnosis not present

## 2021-12-02 DIAGNOSIS — Z9981 Dependence on supplemental oxygen: Secondary | ICD-10-CM | POA: Diagnosis not present

## 2021-12-02 DIAGNOSIS — I10 Essential (primary) hypertension: Secondary | ICD-10-CM | POA: Diagnosis not present

## 2021-12-02 DIAGNOSIS — E1122 Type 2 diabetes mellitus with diabetic chronic kidney disease: Secondary | ICD-10-CM | POA: Diagnosis not present

## 2021-12-03 DIAGNOSIS — R0989 Other specified symptoms and signs involving the circulatory and respiratory systems: Secondary | ICD-10-CM | POA: Diagnosis not present

## 2021-12-07 DIAGNOSIS — K5900 Constipation, unspecified: Secondary | ICD-10-CM | POA: Diagnosis not present

## 2021-12-07 DIAGNOSIS — M545 Low back pain, unspecified: Secondary | ICD-10-CM | POA: Diagnosis not present

## 2021-12-08 DIAGNOSIS — I10 Essential (primary) hypertension: Secondary | ICD-10-CM | POA: Diagnosis not present

## 2021-12-08 DIAGNOSIS — Z9981 Dependence on supplemental oxygen: Secondary | ICD-10-CM | POA: Diagnosis not present

## 2021-12-08 DIAGNOSIS — I4891 Unspecified atrial fibrillation: Secondary | ICD-10-CM | POA: Diagnosis not present

## 2021-12-08 DIAGNOSIS — E1122 Type 2 diabetes mellitus with diabetic chronic kidney disease: Secondary | ICD-10-CM | POA: Diagnosis not present

## 2021-12-08 DIAGNOSIS — I509 Heart failure, unspecified: Secondary | ICD-10-CM | POA: Diagnosis not present

## 2021-12-08 DIAGNOSIS — J449 Chronic obstructive pulmonary disease, unspecified: Secondary | ICD-10-CM | POA: Diagnosis not present

## 2021-12-08 DIAGNOSIS — E782 Mixed hyperlipidemia: Secondary | ICD-10-CM | POA: Diagnosis not present

## 2021-12-08 DIAGNOSIS — N189 Chronic kidney disease, unspecified: Secondary | ICD-10-CM | POA: Diagnosis not present

## 2021-12-08 DIAGNOSIS — Z7409 Other reduced mobility: Secondary | ICD-10-CM | POA: Diagnosis not present

## 2021-12-09 DIAGNOSIS — I509 Heart failure, unspecified: Secondary | ICD-10-CM | POA: Diagnosis not present

## 2021-12-09 DIAGNOSIS — E782 Mixed hyperlipidemia: Secondary | ICD-10-CM | POA: Diagnosis not present

## 2021-12-09 DIAGNOSIS — Z9981 Dependence on supplemental oxygen: Secondary | ICD-10-CM | POA: Diagnosis not present

## 2021-12-09 DIAGNOSIS — I4891 Unspecified atrial fibrillation: Secondary | ICD-10-CM | POA: Diagnosis not present

## 2021-12-09 DIAGNOSIS — I10 Essential (primary) hypertension: Secondary | ICD-10-CM | POA: Diagnosis not present

## 2021-12-09 DIAGNOSIS — J449 Chronic obstructive pulmonary disease, unspecified: Secondary | ICD-10-CM | POA: Diagnosis not present

## 2021-12-09 DIAGNOSIS — N189 Chronic kidney disease, unspecified: Secondary | ICD-10-CM | POA: Diagnosis not present

## 2021-12-09 DIAGNOSIS — Z7409 Other reduced mobility: Secondary | ICD-10-CM | POA: Diagnosis not present

## 2021-12-09 DIAGNOSIS — E1122 Type 2 diabetes mellitus with diabetic chronic kidney disease: Secondary | ICD-10-CM | POA: Diagnosis not present

## 2021-12-12 IMAGING — DX DG CHEST 2V
2 series · 2 of 2 positions shown · non-contrast
Comparison: Chest x-ray 09/16/2021.

CLINICAL DATA: 75-year-old male with history of shortness of
breath. Bilateral leg swelling.

EXAM:
CHEST - 2 VIEW

[chest ap]
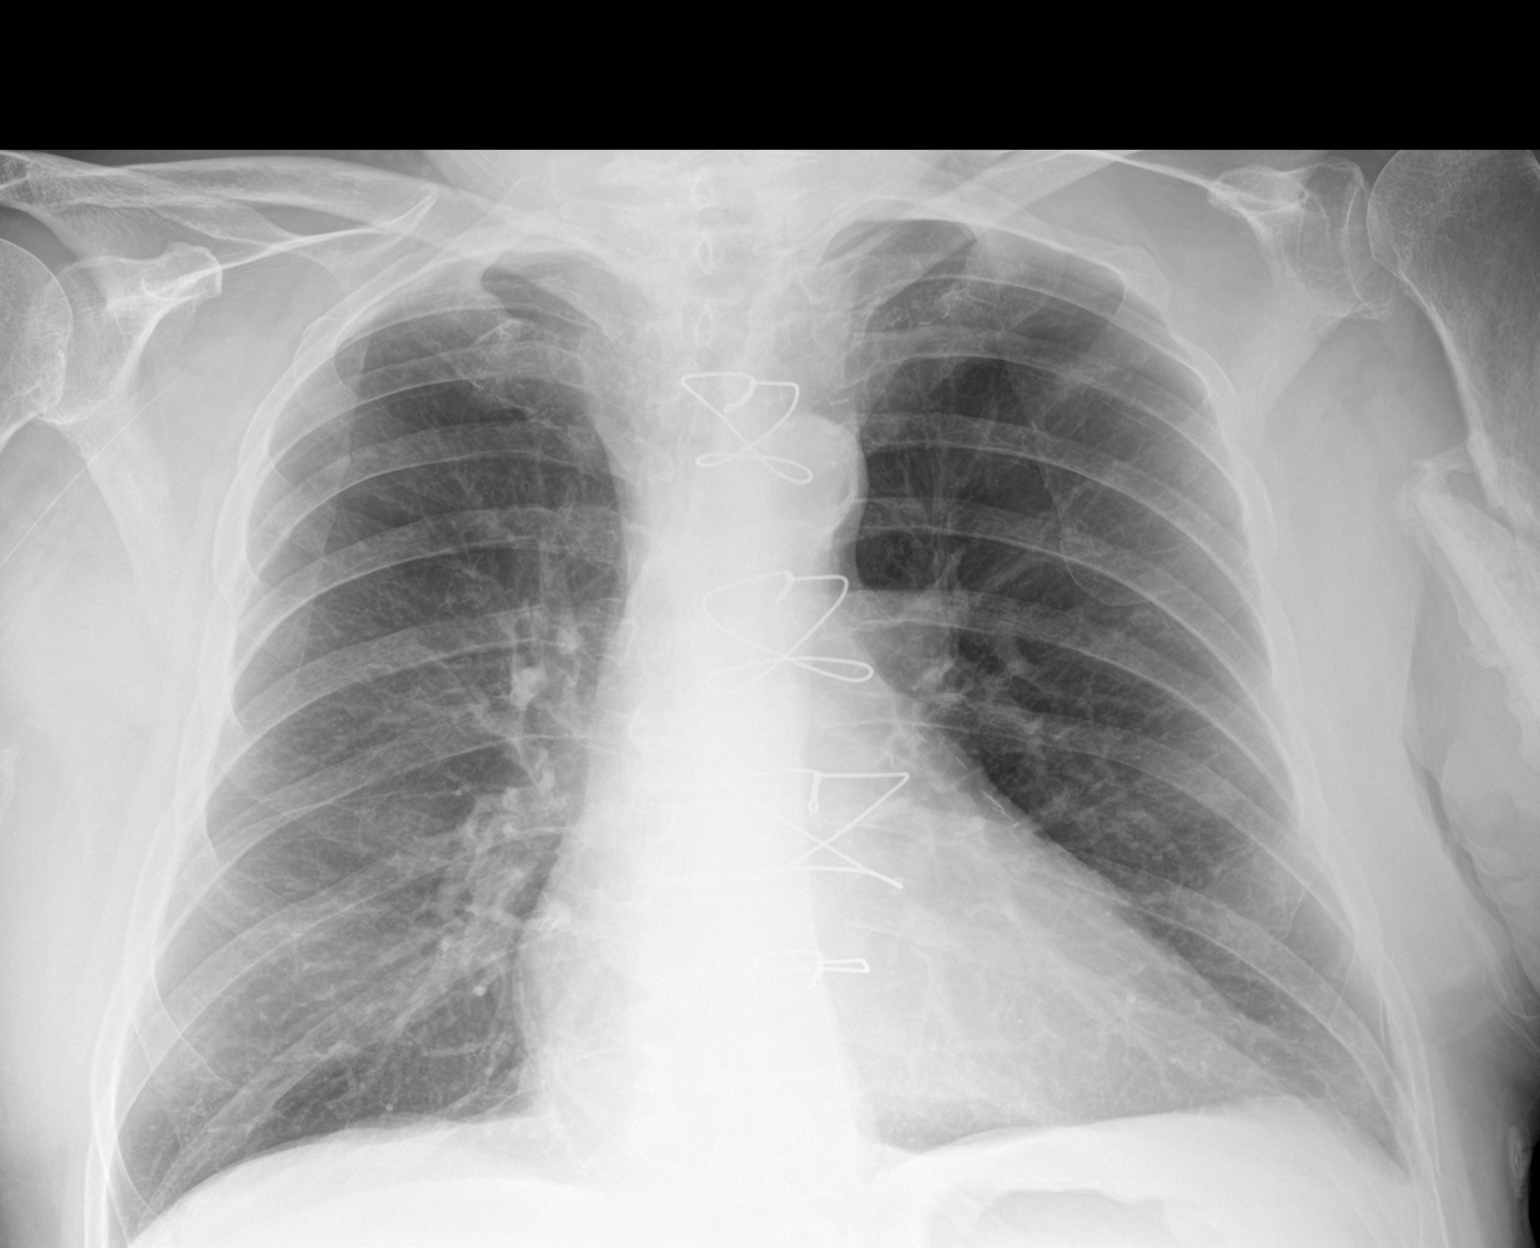

[chest lat]
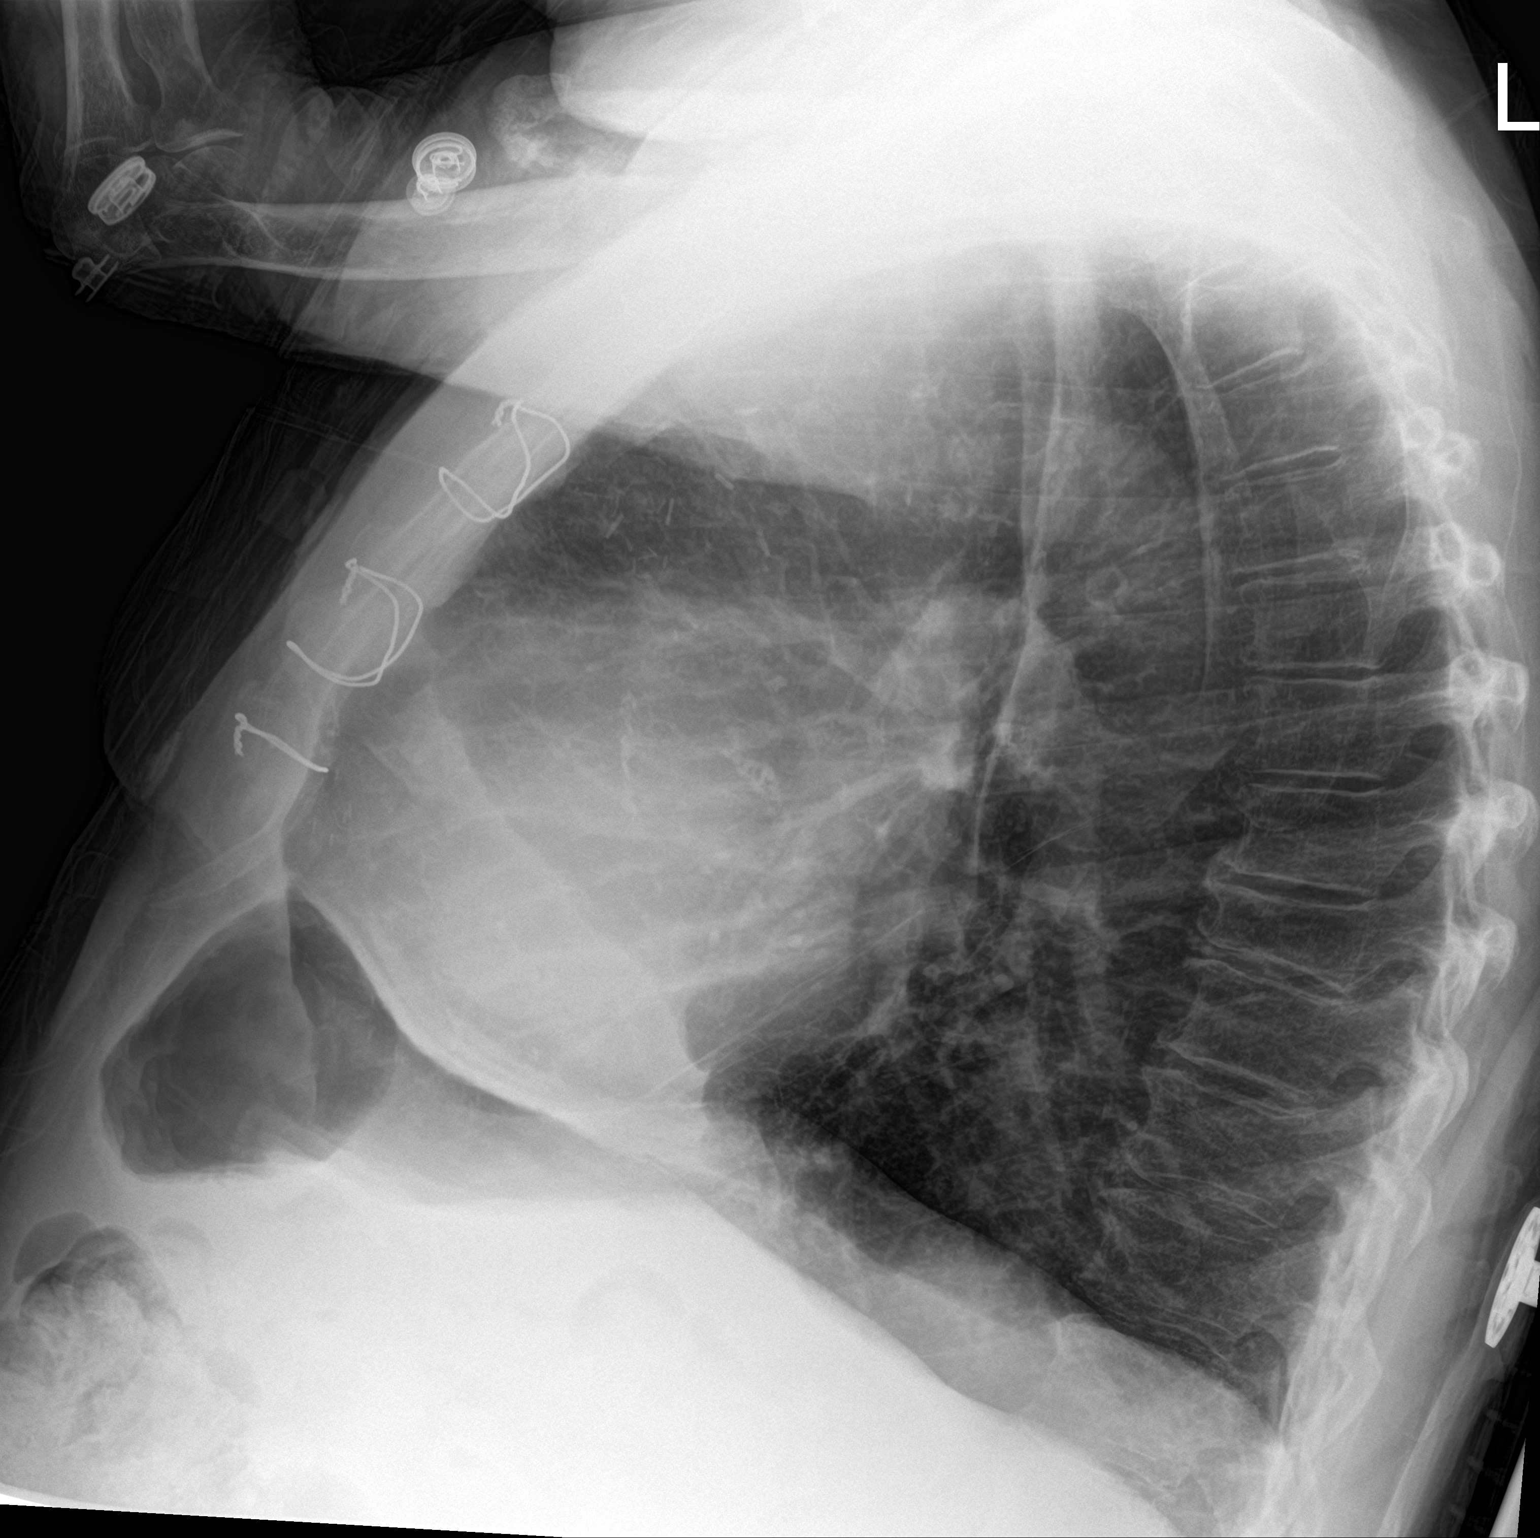

[2 of 2 positions shown; findings below may reference images not displayed]

FINDINGS: Lung volumes are increased with emphysematous changes. No
consolidative airspace disease. No pleural effusions. No
pneumothorax. No pulmonary nodule or mass noted. Pulmonary
vasculature and the cardiomediastinal silhouette are within normal
limits. Atherosclerosis in the thoracic aorta. Status post median
sternotomy.
IMPRESSION: 1. No radiographic evidence of acute cardiopulmonary disease.
2. Emphysema.
3. Aortic atherosclerosis.

## 2021-12-14 DIAGNOSIS — I509 Heart failure, unspecified: Secondary | ICD-10-CM | POA: Diagnosis not present

## 2021-12-14 DIAGNOSIS — I4891 Unspecified atrial fibrillation: Secondary | ICD-10-CM | POA: Diagnosis not present

## 2021-12-14 DIAGNOSIS — E1122 Type 2 diabetes mellitus with diabetic chronic kidney disease: Secondary | ICD-10-CM | POA: Diagnosis not present

## 2021-12-14 DIAGNOSIS — Z7409 Other reduced mobility: Secondary | ICD-10-CM | POA: Diagnosis not present

## 2021-12-14 DIAGNOSIS — J449 Chronic obstructive pulmonary disease, unspecified: Secondary | ICD-10-CM | POA: Diagnosis not present

## 2021-12-14 DIAGNOSIS — E782 Mixed hyperlipidemia: Secondary | ICD-10-CM | POA: Diagnosis not present

## 2021-12-14 DIAGNOSIS — N189 Chronic kidney disease, unspecified: Secondary | ICD-10-CM | POA: Diagnosis not present

## 2021-12-14 DIAGNOSIS — I10 Essential (primary) hypertension: Secondary | ICD-10-CM | POA: Diagnosis not present

## 2021-12-14 DIAGNOSIS — Z9981 Dependence on supplemental oxygen: Secondary | ICD-10-CM | POA: Diagnosis not present

## 2021-12-17 DIAGNOSIS — Z7409 Other reduced mobility: Secondary | ICD-10-CM | POA: Diagnosis not present

## 2021-12-17 DIAGNOSIS — J449 Chronic obstructive pulmonary disease, unspecified: Secondary | ICD-10-CM | POA: Diagnosis not present

## 2021-12-17 DIAGNOSIS — E1122 Type 2 diabetes mellitus with diabetic chronic kidney disease: Secondary | ICD-10-CM | POA: Diagnosis not present

## 2021-12-17 DIAGNOSIS — I10 Essential (primary) hypertension: Secondary | ICD-10-CM | POA: Diagnosis not present

## 2021-12-17 DIAGNOSIS — I509 Heart failure, unspecified: Secondary | ICD-10-CM | POA: Diagnosis not present

## 2021-12-17 DIAGNOSIS — N189 Chronic kidney disease, unspecified: Secondary | ICD-10-CM | POA: Diagnosis not present

## 2021-12-17 DIAGNOSIS — Z9981 Dependence on supplemental oxygen: Secondary | ICD-10-CM | POA: Diagnosis not present

## 2021-12-17 DIAGNOSIS — E782 Mixed hyperlipidemia: Secondary | ICD-10-CM | POA: Diagnosis not present

## 2021-12-17 DIAGNOSIS — I4891 Unspecified atrial fibrillation: Secondary | ICD-10-CM | POA: Diagnosis not present

## 2021-12-21 DIAGNOSIS — J449 Chronic obstructive pulmonary disease, unspecified: Secondary | ICD-10-CM | POA: Diagnosis not present

## 2021-12-22 DIAGNOSIS — I4891 Unspecified atrial fibrillation: Secondary | ICD-10-CM | POA: Diagnosis not present

## 2021-12-22 DIAGNOSIS — E1122 Type 2 diabetes mellitus with diabetic chronic kidney disease: Secondary | ICD-10-CM | POA: Diagnosis not present

## 2021-12-22 DIAGNOSIS — Z7409 Other reduced mobility: Secondary | ICD-10-CM | POA: Diagnosis not present

## 2021-12-22 DIAGNOSIS — N189 Chronic kidney disease, unspecified: Secondary | ICD-10-CM | POA: Diagnosis not present

## 2021-12-22 DIAGNOSIS — E782 Mixed hyperlipidemia: Secondary | ICD-10-CM | POA: Diagnosis not present

## 2021-12-22 DIAGNOSIS — Z9981 Dependence on supplemental oxygen: Secondary | ICD-10-CM | POA: Diagnosis not present

## 2021-12-22 DIAGNOSIS — J449 Chronic obstructive pulmonary disease, unspecified: Secondary | ICD-10-CM | POA: Diagnosis not present

## 2021-12-22 DIAGNOSIS — I10 Essential (primary) hypertension: Secondary | ICD-10-CM | POA: Diagnosis not present

## 2021-12-22 DIAGNOSIS — I509 Heart failure, unspecified: Secondary | ICD-10-CM | POA: Diagnosis not present

## 2021-12-23 DIAGNOSIS — J449 Chronic obstructive pulmonary disease, unspecified: Secondary | ICD-10-CM | POA: Diagnosis not present

## 2021-12-23 DIAGNOSIS — I509 Heart failure, unspecified: Secondary | ICD-10-CM | POA: Diagnosis not present

## 2021-12-23 DIAGNOSIS — N189 Chronic kidney disease, unspecified: Secondary | ICD-10-CM | POA: Diagnosis not present

## 2021-12-23 DIAGNOSIS — Z7409 Other reduced mobility: Secondary | ICD-10-CM | POA: Diagnosis not present

## 2021-12-23 DIAGNOSIS — E782 Mixed hyperlipidemia: Secondary | ICD-10-CM | POA: Diagnosis not present

## 2021-12-23 DIAGNOSIS — E1122 Type 2 diabetes mellitus with diabetic chronic kidney disease: Secondary | ICD-10-CM | POA: Diagnosis not present

## 2021-12-23 DIAGNOSIS — I4891 Unspecified atrial fibrillation: Secondary | ICD-10-CM | POA: Diagnosis not present

## 2021-12-23 DIAGNOSIS — Z9981 Dependence on supplemental oxygen: Secondary | ICD-10-CM | POA: Diagnosis not present

## 2021-12-23 DIAGNOSIS — I10 Essential (primary) hypertension: Secondary | ICD-10-CM | POA: Diagnosis not present

## 2021-12-24 DIAGNOSIS — Z7409 Other reduced mobility: Secondary | ICD-10-CM | POA: Diagnosis not present

## 2021-12-24 DIAGNOSIS — Z9981 Dependence on supplemental oxygen: Secondary | ICD-10-CM | POA: Diagnosis not present

## 2021-12-24 DIAGNOSIS — E1122 Type 2 diabetes mellitus with diabetic chronic kidney disease: Secondary | ICD-10-CM | POA: Diagnosis not present

## 2021-12-24 DIAGNOSIS — I509 Heart failure, unspecified: Secondary | ICD-10-CM | POA: Diagnosis not present

## 2021-12-24 DIAGNOSIS — I4891 Unspecified atrial fibrillation: Secondary | ICD-10-CM | POA: Diagnosis not present

## 2021-12-24 DIAGNOSIS — N189 Chronic kidney disease, unspecified: Secondary | ICD-10-CM | POA: Diagnosis not present

## 2021-12-24 DIAGNOSIS — I10 Essential (primary) hypertension: Secondary | ICD-10-CM | POA: Diagnosis not present

## 2021-12-24 DIAGNOSIS — E782 Mixed hyperlipidemia: Secondary | ICD-10-CM | POA: Diagnosis not present

## 2021-12-24 DIAGNOSIS — J449 Chronic obstructive pulmonary disease, unspecified: Secondary | ICD-10-CM | POA: Diagnosis not present

## 2021-12-28 ENCOUNTER — Emergency Department (HOSPITAL_COMMUNITY): Payer: Medicare Other

## 2021-12-28 ENCOUNTER — Inpatient Hospital Stay (HOSPITAL_COMMUNITY)
Admission: EM | Admit: 2021-12-28 | Discharge: 2022-01-01 | DRG: 190 | Disposition: A | Discharge status: Home Health | Payer: Medicare Other | Attending: Family Medicine | Admitting: Family Medicine

## 2021-12-28 ENCOUNTER — Other Ambulatory Visit: Payer: Self-pay

## 2021-12-28 ENCOUNTER — Encounter (HOSPITAL_COMMUNITY): Payer: Self-pay | Admitting: *Deleted

## 2021-12-28 DIAGNOSIS — N400 Enlarged prostate without lower urinary tract symptoms: Secondary | ICD-10-CM | POA: Diagnosis present

## 2021-12-28 DIAGNOSIS — R7989 Other specified abnormal findings of blood chemistry: Secondary | ICD-10-CM

## 2021-12-28 DIAGNOSIS — E782 Mixed hyperlipidemia: Secondary | ICD-10-CM | POA: Diagnosis not present

## 2021-12-28 DIAGNOSIS — D539 Nutritional anemia, unspecified: Secondary | ICD-10-CM | POA: Diagnosis not present

## 2021-12-28 DIAGNOSIS — K219 Gastro-esophageal reflux disease without esophagitis: Secondary | ICD-10-CM

## 2021-12-28 DIAGNOSIS — I130 Hypertensive heart and chronic kidney disease with heart failure and stage 1 through stage 4 chronic kidney disease, or unspecified chronic kidney disease: Secondary | ICD-10-CM | POA: Diagnosis present

## 2021-12-28 DIAGNOSIS — N1832 Chronic kidney disease, stage 3b: Secondary | ICD-10-CM | POA: Diagnosis present

## 2021-12-28 DIAGNOSIS — E46 Unspecified protein-calorie malnutrition: Secondary | ICD-10-CM | POA: Diagnosis present

## 2021-12-28 DIAGNOSIS — Z7901 Long term (current) use of anticoagulants: Secondary | ICD-10-CM

## 2021-12-28 DIAGNOSIS — Z7951 Long term (current) use of inhaled steroids: Secondary | ICD-10-CM

## 2021-12-28 DIAGNOSIS — J9622 Acute and chronic respiratory failure with hypercapnia: Secondary | ICD-10-CM

## 2021-12-28 DIAGNOSIS — J441 Chronic obstructive pulmonary disease with (acute) exacerbation: Principal | ICD-10-CM

## 2021-12-28 DIAGNOSIS — Z9981 Dependence on supplemental oxygen: Secondary | ICD-10-CM | POA: Diagnosis not present

## 2021-12-28 DIAGNOSIS — R6889 Other general symptoms and signs: Secondary | ICD-10-CM | POA: Diagnosis not present

## 2021-12-28 DIAGNOSIS — Z832 Family history of diseases of the blood and blood-forming organs and certain disorders involving the immune mechanism: Secondary | ICD-10-CM

## 2021-12-28 DIAGNOSIS — R069 Unspecified abnormalities of breathing: Secondary | ICD-10-CM | POA: Diagnosis not present

## 2021-12-28 DIAGNOSIS — Z888 Allergy status to other drugs, medicaments and biological substances status: Secondary | ICD-10-CM | POA: Diagnosis not present

## 2021-12-28 DIAGNOSIS — Z6826 Body mass index (BMI) 26.0-26.9, adult: Secondary | ICD-10-CM

## 2021-12-28 DIAGNOSIS — I499 Cardiac arrhythmia, unspecified: Secondary | ICD-10-CM | POA: Diagnosis not present

## 2021-12-28 DIAGNOSIS — N179 Acute kidney failure, unspecified: Secondary | ICD-10-CM

## 2021-12-28 DIAGNOSIS — E8809 Other disorders of plasma-protein metabolism, not elsewhere classified: Secondary | ICD-10-CM | POA: Diagnosis present

## 2021-12-28 DIAGNOSIS — J9621 Acute and chronic respiratory failure with hypoxia: Secondary | ICD-10-CM | POA: Diagnosis not present

## 2021-12-28 DIAGNOSIS — I482 Chronic atrial fibrillation, unspecified: Secondary | ICD-10-CM | POA: Diagnosis not present

## 2021-12-28 DIAGNOSIS — R0602 Shortness of breath: Secondary | ICD-10-CM | POA: Diagnosis not present

## 2021-12-28 DIAGNOSIS — Z79899 Other long term (current) drug therapy: Secondary | ICD-10-CM

## 2021-12-28 DIAGNOSIS — F17210 Nicotine dependence, cigarettes, uncomplicated: Secondary | ICD-10-CM | POA: Diagnosis not present

## 2021-12-28 DIAGNOSIS — Z20822 Contact with and (suspected) exposure to covid-19: Secondary | ICD-10-CM | POA: Diagnosis present

## 2021-12-28 DIAGNOSIS — E7800 Pure hypercholesterolemia, unspecified: Secondary | ICD-10-CM | POA: Diagnosis not present

## 2021-12-28 DIAGNOSIS — E441 Mild protein-calorie malnutrition: Secondary | ICD-10-CM | POA: Diagnosis not present

## 2021-12-28 DIAGNOSIS — Z951 Presence of aortocoronary bypass graft: Secondary | ICD-10-CM

## 2021-12-28 DIAGNOSIS — Z8249 Family history of ischemic heart disease and other diseases of the circulatory system: Secondary | ICD-10-CM

## 2021-12-28 DIAGNOSIS — I5032 Chronic diastolic (congestive) heart failure: Secondary | ICD-10-CM | POA: Diagnosis not present

## 2021-12-28 DIAGNOSIS — N189 Chronic kidney disease, unspecified: Secondary | ICD-10-CM

## 2021-12-28 DIAGNOSIS — J449 Chronic obstructive pulmonary disease, unspecified: Secondary | ICD-10-CM | POA: Diagnosis not present

## 2021-12-28 DIAGNOSIS — H9190 Unspecified hearing loss, unspecified ear: Secondary | ICD-10-CM | POA: Diagnosis present

## 2021-12-28 DIAGNOSIS — Z7902 Long term (current) use of antithrombotics/antiplatelets: Secondary | ICD-10-CM | POA: Diagnosis not present

## 2021-12-28 DIAGNOSIS — E1122 Type 2 diabetes mellitus with diabetic chronic kidney disease: Secondary | ICD-10-CM | POA: Diagnosis not present

## 2021-12-28 DIAGNOSIS — Z743 Need for continuous supervision: Secondary | ICD-10-CM | POA: Diagnosis not present

## 2021-12-28 DIAGNOSIS — D696 Thrombocytopenia, unspecified: Secondary | ICD-10-CM

## 2021-12-28 DIAGNOSIS — I4891 Unspecified atrial fibrillation: Secondary | ICD-10-CM | POA: Diagnosis not present

## 2021-12-28 DIAGNOSIS — I509 Heart failure, unspecified: Secondary | ICD-10-CM | POA: Diagnosis not present

## 2021-12-28 DIAGNOSIS — D631 Anemia in chronic kidney disease: Secondary | ICD-10-CM | POA: Diagnosis not present

## 2021-12-28 DIAGNOSIS — Z825 Family history of asthma and other chronic lower respiratory diseases: Secondary | ICD-10-CM

## 2021-12-28 DIAGNOSIS — Z7409 Other reduced mobility: Secondary | ICD-10-CM | POA: Diagnosis not present

## 2021-12-28 DIAGNOSIS — D649 Anemia, unspecified: Secondary | ICD-10-CM

## 2021-12-28 DIAGNOSIS — R0689 Other abnormalities of breathing: Secondary | ICD-10-CM | POA: Diagnosis not present

## 2021-12-28 DIAGNOSIS — I10 Essential (primary) hypertension: Secondary | ICD-10-CM | POA: Diagnosis not present

## 2021-12-28 LAB — CBC WITH DIFFERENTIAL/PLATELET
Abs Immature Granulocytes: 0.04 10*3/uL (ref 0.00–0.07)
Basophils Absolute: 0 10*3/uL (ref 0.0–0.1)
Basophils Relative: 0 %
Eosinophils Absolute: 0 10*3/uL (ref 0.0–0.5)
Eosinophils Relative: 1 %
HCT: 28 % — ABNORMAL LOW (ref 39.0–52.0)
Hemoglobin: 8.6 g/dL — ABNORMAL LOW (ref 13.0–17.0)
Immature Granulocytes: 1 %
Lymphocytes Relative: 17 %
Lymphs Abs: 1.1 10*3/uL (ref 0.7–4.0)
MCH: 31.4 pg (ref 26.0–34.0)
MCHC: 30.7 g/dL (ref 30.0–36.0)
MCV: 102.2 fL — ABNORMAL HIGH (ref 80.0–100.0)
Monocytes Absolute: 0.7 10*3/uL (ref 0.1–1.0)
Monocytes Relative: 11 %
Neutro Abs: 4.7 10*3/uL (ref 1.7–7.7)
Neutrophils Relative %: 70 %
Platelets: 137 10*3/uL — ABNORMAL LOW (ref 150–400)
RBC: 2.74 MIL/uL — ABNORMAL LOW (ref 4.22–5.81)
RDW: 17.6 % — ABNORMAL HIGH (ref 11.5–15.5)
WBC: 6.7 10*3/uL (ref 4.0–10.5)
nRBC: 0 % (ref 0.0–0.2)

## 2021-12-28 LAB — TROPONIN I (HIGH SENSITIVITY)
Troponin I (High Sensitivity): 10 ng/L (ref <18)
Troponin I (High Sensitivity): 9 ng/L (ref <18)

## 2021-12-28 LAB — COMPREHENSIVE METABOLIC PANEL
ALT: 16 U/L (ref 0–44)
AST: 18 U/L (ref 15–41)
Albumin: 3.4 g/dL — ABNORMAL LOW (ref 3.5–5.0)
Alkaline Phosphatase: 60 U/L (ref 38–126)
Anion gap: 4 — ABNORMAL LOW (ref 5–15)
BUN: 36 mg/dL — ABNORMAL HIGH (ref 8–23)
CO2: 32 mmol/L (ref 22–32)
Calcium: 8.8 mg/dL — ABNORMAL LOW (ref 8.9–10.3)
Chloride: 102 mmol/L (ref 98–111)
Creatinine, Ser: 2.17 mg/dL — ABNORMAL HIGH (ref 0.61–1.24)
GFR, Estimated: 31 mL/min — ABNORMAL LOW (ref >60)
Glucose, Bld: 93 mg/dL (ref 70–99)
Potassium: 3.9 mmol/L (ref 3.5–5.1)
Sodium: 138 mmol/L (ref 135–145)
Total Bilirubin: 0.6 mg/dL (ref 0.3–1.2)
Total Protein: 6.7 g/dL (ref 6.5–8.1)

## 2021-12-28 LAB — RESP PANEL BY RT-PCR (FLU A&B, COVID) ARPGX2
Influenza A by PCR: NEGATIVE
Influenza B by PCR: NEGATIVE
SARS Coronavirus 2 by RT PCR: NEGATIVE

## 2021-12-28 LAB — POC OCCULT BLOOD, ED: Fecal Occult Bld: NEGATIVE

## 2021-12-28 LAB — BRAIN NATRIURETIC PEPTIDE: B Natriuretic Peptide: 568 pg/mL — ABNORMAL HIGH (ref 0.0–100.0)

## 2021-12-28 MED ORDER — IPRATROPIUM BROMIDE 0.02 % IN SOLN
0.5000 mg | Freq: Once | RESPIRATORY_TRACT | Status: AC
  Administered 2021-12-28: 0.5 mg via RESPIRATORY_TRACT
  Filled 2021-12-28: qty 2.5

## 2021-12-28 MED ORDER — METHYLPREDNISOLONE SODIUM SUCC 125 MG IJ SOLR
125.0000 mg | Freq: Once | INTRAMUSCULAR | Status: AC
  Administered 2021-12-28: 125 mg via INTRAVENOUS
  Filled 2021-12-28: qty 2

## 2021-12-28 MED ORDER — ALBUTEROL SULFATE (2.5 MG/3ML) 0.083% IN NEBU
5.0000 mg | INHALATION_SOLUTION | Freq: Once | RESPIRATORY_TRACT | Status: AC
  Administered 2021-12-28: 5 mg via RESPIRATORY_TRACT
  Filled 2021-12-28: qty 6

## 2021-12-28 NOTE — ED Provider Notes (Signed)
[facility] EMERGENCY DEPARTMENT Provider Note   CSN: [id number] Arrival date & time: 12/28/21  1742     History  Chief Complaint  Patient presents with   Shortness of Breath    Daniel Barker is a 76 y.o. male.   Shortness of Breath Associated symptoms: no abdominal pain   Patient resents with shortness of breath.  Has had over around the last week.  Is on continuous oxygen at 2 L.  Has been getting worse.  Cough with some mild sputum production.  Always has some chronic sputum.  States he was unable to walk like normal.  States sats will drop into the 70s.  Has a history of CHF and COPD.  No known sick contacts.    Home Medications Prior to Admission medications   Medication Sig Start Date End Date Taking? Authorizing Provider  acetaminophen (TYLENOL) 500 MG tablet Take 1,000 mg by mouth every 6 (six) hours as needed for mild pain.   Yes [provider]  albuterol (PROVENTIL) (2.5 MG/3ML) 0.083% nebulizer solution Take 3 mLs (2.5 mg total) by nebulization every 6 (six) hours as needed for wheezing or shortness of breath. DX: J44.9 Needs appointment before future refill. 12/02/21  Yes Tanda Rockers, MD  albuterol (VENTOLIN HFA) 108 (90 Base) MCG/ACT inhaler Inhale 2 puffs into the lungs every 4 (four) hours as needed. 10/30/21  Yes Emokpae, Courage, MD  amLODipine (NORVASC) 5 MG tablet Take 1 tablet (5 mg total) by mouth daily. 10/30/21  Yes Emokpae, Courage, MD  bisoprolol (ZEBETA) 5 MG tablet Take 0.5 tablets (2.5 mg total) by mouth daily. HOLD FOR  HEART RATE LESS THAN 65 OR SBP LESS THAN 95 08/14/21  Yes Johnson, Clanford L, MD  budesonide-formoterol (SYMBICORT) 160-4.5 MCG/ACT inhaler USE 2 PUFFS BY MOUTH EVERY MORNING AND 2 PUFFS EVERY EVENING 11/10/21  Yes Emokpae, Courage, MD  clopidogrel (PLAVIX) 75 MG tablet Take 1 tablet (75 mg total) by mouth daily. 10/30/21  Yes Emokpae, Courage, MD  dextromethorphan-guaiFENesin (MUCINEX DM) 30-600 MG 12hr tablet Take 1 tablet by  mouth 2 (two) times daily. 10/30/21  Yes Emokpae, Courage, MD  fenofibrate micronized (LOFIBRA) 134 MG capsule Take 134 mg by mouth daily before breakfast.   Yes [provider]  montelukast (SINGULAIR) 10 MG tablet Take 10 mg by mouth at bedtime.   Yes [provider]  pantoprazole (PROTONIX) 40 MG tablet Take 1 tablet (40 mg total) by mouth 2 (two) times daily. 10/30/21  Yes Emokpae, Courage, MD  polyethylene glycol powder (GLYCOLAX/MIRALAX) 17 GM/SCOOP powder Take 1 Container by mouth in the morning and at bedtime.   Yes [provider]  potassium chloride SA (KLOR-CON M) 20 MEQ tablet Take 1 tablet (20 mEq total) by mouth daily. Take will taking Torsemide/Demadex Fluid Medicine 10/30/21  Yes Roxan Hockey, MD  propafenone (RYTHMOL) 225 MG tablet Take 225 mg by mouth every 8 (eight) hours.   Yes [provider]  tamsulosin (FLOMAX) 0.4 MG CAPS capsule Take 1 capsule (0.4 mg total) by mouth daily after supper. 10/30/21  Yes Emokpae, Courage, MD  Tiotropium Bromide Monohydrate (SPIRIVA RESPIMAT) 2.5 MCG/ACT AERS Inhale 2 puffs into the lungs daily. 10/30/21  Yes Emokpae, Courage, MD  torsemide (DEMADEX) 20 MG tablet Take 1 tablet (20 mg total) by mouth See admin instructions. Take 1 Tablet 20 mg qam and half tab (10 mg) every evening 10/30/21 10/30/22 Yes Emokpae, Courage, MD  zinc sulfate 220 (50 Zn) MG capsule Take 1 capsule (220  mg total) by mouth daily. 05/24/21  Yes Barton Dubois, MD  Nebulizers (COMPRESSOR/NEBULIZER) MISC Inhale 1 Units into the lungs as needed. 10/30/21   Roxan Hockey, MD      Allergies    Lipitor [atorvastatin]    Review of Systems   Review of Systems  Constitutional:  Negative for appetite change.  HENT:  Negative for congestion.   Respiratory:  Positive for shortness of breath.   Cardiovascular:  Positive for leg swelling.  Gastrointestinal:  Negative for abdominal pain.  Genitourinary:  Negative for flank pain.  Neurological:   Negative for weakness.   Physical Exam Updated Vital Signs BP 133/63    Pulse 76    Temp 98.4 F (36.9 C) (Oral)    Resp 19    Ht 5' 6.5" (1.689 m)    Wt 75.8 kg    SpO2 97%    BMI 26.55 kg/m  Physical Exam Vitals reviewed.  HENT:     Head: Atraumatic.  Cardiovascular:     Rate and Rhythm: Normal rate and regular rhythm.  Pulmonary:     Comments: Mildly harsh breath sounds with some wheezing and potentially some slight rales. Chest:     Chest wall: No tenderness.  Abdominal:     Tenderness: There is no abdominal tenderness.  Musculoskeletal:     Cervical back: Neck supple.     Comments: Mild edema bilaterally  Skin:    Capillary Refill: Capillary refill takes less than 2 seconds.  Neurological:     Mental Status: He is alert and oriented to person, place, and time.    ED Results / Procedures / Treatments   Labs (all labs ordered are listed, but only abnormal results are displayed) Labs Reviewed  COMPREHENSIVE METABOLIC PANEL - Abnormal; Notable for the following components:      Result Value   BUN 36 (*)    Creatinine, Ser 2.17 (*)    Calcium 8.8 (*)    Albumin 3.4 (*)    GFR, Estimated 31 (*)    Anion gap 4 (*)    All other components within normal limits  BRAIN NATRIURETIC PEPTIDE - Abnormal; Notable for the following components:   B Natriuretic Peptide 568.0 (*)    All other components within normal limits  CBC WITH DIFFERENTIAL/PLATELET - Abnormal; Notable for the following components:   RBC 2.74 (*)    Hemoglobin 8.6 (*)    HCT 28.0 (*)    MCV 102.2 (*)    RDW 17.6 (*)    Platelets 137 (*)    All other components within normal limits  RESP PANEL BY RT-PCR (FLU A&B, COVID) ARPGX2  POC OCCULT BLOOD, ED  TROPONIN I (HIGH SENSITIVITY)  TROPONIN I (HIGH SENSITIVITY)    EKG EKG Interpretation  Date/Time:  Tuesday December 28 2021 17:59:52 EST Ventricular Rate:  70 PR Interval:  137 QRS Duration: 119 QT Interval:  425 QTC Calculation: 459 R  Axis:   45 Text Interpretation: Sinus or ectopic atrial rhythm Nonspecific intraventricular conduction delay Low voltage, precordial leads Anteroseptal infarct, old Confirmed by Davonna Belling [phone]) on 12/28/2021 11:19:15 PM  Radiology DG Chest Portable 1 View  Result Date: 12/28/2021 CLINICAL DATA:  Shortness of breath. EXAM: PORTABLE CHEST 1 VIEW COMPARISON:  Chest x-ray 10/27/2021. FINDINGS: Sternotomy wires are present. The heart size and mediastinal contours are within normal limits. Both lungs are clear. The visualized skeletal structures are unremarkable. IMPRESSION: No active disease. Electronically Signed   By: Tina Griffiths.D.  On: 12/28/2021 18:45    Procedures Procedures    Medications Ordered in ED Medications  methylPREDNISolone sodium succinate (SOLU-MEDROL) 125 mg/2 mL injection 125 mg (125 mg Intravenous Given 12/28/21 2235)  albuterol (PROVENTIL) (2.5 MG/3ML) 0.083% nebulizer solution 5 mg (5 mg Nebulization Given 12/28/21 2241)  ipratropium (ATROVENT) nebulizer solution 0.5 mg (0.5 mg Nebulization Given 12/28/21 2241)    ED Course/ Medical Decision Making/ A&P                           Medical Decision Making Amount and/or Complexity of Data Reviewed External Data Reviewed: notes.    Details: recent D/c note Labs: ordered. Decision-making details documented in ED Course. Radiology: ordered and independent interpretation performed. Decision-making details documented in ED Course. ECG/medicine tests: independent interpretation performed. Decision-making details documented in ED Course. Discussion of management or test interpretation with external provider(s): hospitalist  Risk Prescription drug management.   Patient presents with shortness of breath.  Has had for the last few days.  History of COPD and CHF.  Is on chronic oxygen at 2 L.  Has had more shortness of breath including saturations down to the 70s with ambulation.  Not able to do the same activity.   Has only mild edema on his legs.  Chest x-ray independently interpreted and showed no active disease.  BNP is mildly elevated however.  However does have a new anemia with hemoglobin 8.6.  Patient is guaiac negative.  No visible blood in stool.  Does have wheezing on exam.  Breathing treatment and steroids given.  Doing better on oxygen but still feels dyspneic.  Will admit to hospitalist for COPD exacerbation.          Final Clinical Impression(s) / ED Diagnoses Final diagnoses:  COPD exacerbation ([place])  Anemia, unspecified type    Rx / DC Orders ED Discharge Orders     None         Davonna Belling, MD 12/28/21 2323

## 2021-12-28 NOTE — H&P (Addendum)
History and Physical  Daniel Barker WJX:[id number] DOB: [birth year] DOA: 12/28/2021  Referring physician: Davonna Belling, MD PCP: [name], MD  Patient coming from: Home  Chief Complaint: Shortness of breath  HPI: Daniel Barker is a 76 y.o. 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  He uses 2 L of oxygen at baseline, patient states that he has been increasing his oxygen need to 4 L/min without much improvement.  Shortness of breath worsens with exertion, patient continues to smoke cigarettes.  Cough was nonproductive, he denies fever, chills, chest pain, headache, nausea, vomiting, abdominal pain.  ED Course:  In the emergency department, he was hemodynamically stable.  Work-up in the ED showed macrocytic anemia, thrombocytopenia, BUN/creatinine 36/2.17 (baseline creatinine at 1.7-1.8), BNP 568 (was 812 about 4 months ago), FOBT was negative, troponin x2 was within normal range.  Influenza A, B, SARS coronavirus 2 was negative. Chest x-ray showed no active disease Patient was treated with IV Solu-Medrol 20 mg x 1, breathing treatment was provided.  Hospitalist was asked to admit patient for further evaluation and management.  Review of Systems: A full 10 point Review of Systems was done, except as stated above, all other Review of systems were negative.  Past Medical History:  Diagnosis Date   Acute metabolic encephalopathy 01/11/864   Asthma    Atrial fibrillation (HCC)    BPH (benign prostatic hyperplasia)    Cigarette smoker    CKD (chronic kidney disease)    COPD (chronic obstructive pulmonary disease) (HCC)    Coronary artery disease    Cough    High  cholesterol    Hx of CABG    Hypercholesteremia    Hypertension    Localized edema    Stented coronary artery    Past Surgical History:  Procedure Laterality Date   CARDIAC SURGERY     CORONARY ARTERY BYPASS GRAFT      Social History:  reports that he quit smoking about 15 months ago. His smoking use included cigarettes. He has a 60.00 pack-year smoking history. He has never used smokeless tobacco. He reports that he does not drink alcohol and does not use drugs.   Allergies  Allergen Reactions   Lipitor [Atorvastatin]     Unknown reaction    Family History  Problem Relation Age of Onset   Hypertension Mother    Clotting disorder Mother    COPD Father      Prior to Admission medications   Medication Sig Start Date End Date Taking? Authorizing Provider  acetaminophen (TYLENOL) 500 MG tablet Take 1,000 mg by mouth every 6 (six) hours as needed for mild pain.   Yes [provider]  albuterol (PROVENTIL) (2.5 MG/3ML) 0.083% nebulizer solution Take 3 mLs (2.5 mg total) by nebulization every 6 (six) hours as needed for wheezing or shortness of breath. DX: J44.9 Needs appointment before future refill. 12/02/21  Yes Tanda Rockers, MD  albuterol (VENTOLIN HFA) 108 (90 Base) MCG/ACT inhaler Inhale 2 puffs into the lungs every 4 (four) hours as needed. 10/30/21  Yes Emokpae, Courage, MD  amLODipine (NORVASC) 5 MG tablet Take 1 tablet (5 mg total) by mouth daily. 10/30/21  Yes Roxan Hockey, MD  bisoprolol (ZEBETA) 5  MG tablet Take 0.5 tablets (2.5 mg total) by mouth daily. HOLD FOR  HEART RATE LESS THAN 65 OR SBP LESS THAN 95 08/14/21  Yes Johnson, Clanford L, MD  budesonide-formoterol (SYMBICORT) 160-4.5 MCG/ACT inhaler USE 2 PUFFS BY MOUTH EVERY MORNING AND 2 PUFFS EVERY EVENING 11/10/21  Yes Emokpae, Courage, MD  clopidogrel (PLAVIX) 75 MG tablet Take 1 tablet (75 mg total) by mouth daily. 10/30/21  Yes Emokpae, Courage, MD  dextromethorphan-guaiFENesin (MUCINEX DM) 30-600 MG  12hr tablet Take 1 tablet by mouth 2 (two) times daily. 10/30/21  Yes Emokpae, Courage, MD  fenofibrate micronized (LOFIBRA) 134 MG capsule Take 134 mg by mouth daily before breakfast.   Yes [provider]  montelukast (SINGULAIR) 10 MG tablet Take 10 mg by mouth at bedtime.   Yes [provider]  pantoprazole (PROTONIX) 40 MG tablet Take 1 tablet (40 mg total) by mouth 2 (two) times daily. 10/30/21  Yes Emokpae, Courage, MD  polyethylene glycol powder (GLYCOLAX/MIRALAX) 17 GM/SCOOP powder Take 1 Container by mouth in the morning and at bedtime.   Yes [provider]  potassium chloride SA (KLOR-CON M) 20 MEQ tablet Take 1 tablet (20 mEq total) by mouth daily. Take will taking Torsemide/Demadex Fluid Medicine 10/30/21  Yes Roxan Hockey, MD  propafenone (RYTHMOL) 225 MG tablet Take 225 mg by mouth every 8 (eight) hours.   Yes [provider]  tamsulosin (FLOMAX) 0.4 MG CAPS capsule Take 1 capsule (0.4 mg total) by mouth daily after supper. 10/30/21  Yes Emokpae, Courage, MD  Tiotropium Bromide Monohydrate (SPIRIVA RESPIMAT) 2.5 MCG/ACT AERS Inhale 2 puffs into the lungs daily. 10/30/21  Yes Emokpae, Courage, MD  torsemide (DEMADEX) 20 MG tablet Take 1 tablet (20 mg total) by mouth See admin instructions. Take 1 Tablet 20 mg qam and half tab (10 mg) every evening 10/30/21 10/30/22 Yes Emokpae, Courage, MD  zinc sulfate 220 (50 Zn) MG capsule Take 1 capsule (220 mg total) by mouth daily. 05/24/21  Yes Barton Dubois, MD  Nebulizers (COMPRESSOR/NEBULIZER) MISC Inhale 1 Units into the lungs as needed. 10/30/21   Roxan Hockey, MD    Physical Exam: BP 121/72 (BP Location: Right Arm)    Pulse 80    Temp 98.2 F (36.8 C) (Oral)    Resp (!) 24    Ht 5' 6.5" (1.689 m)    Wt 76.2 kg    SpO2 99%    BMI 26.71 kg/m   General: 76 y.o. year-old male well developed well nourished in no acute distress.  Alert and oriented x3. HEENT: NCAT, EOMI Neck: Supple, trachea  medial Cardiovascular: Regular rate and rhythm with no rubs or gallops.  No thyromegaly or JVD noted.  2/4 pulses in all 4 extremities. Respiratory: Decreased breath sounds with mild rales and wheezing.  Abdomen: Soft, nontender nondistended with normal bowel sounds x4 quadrants. Muskuloskeletal: +2 bilateral lower extremity edema up to 1/3 shin.  No cyanosis or clubbing  noted bilaterally Neuro: CN II-XII intact, strength 5/5 x 4, sensation, reflexes intact Skin: No ulcerative lesions noted or rashes Psychiatry: Judgement and insight appear normal. Mood is appropriate for condition and setting          Labs on Admission:  Basic Metabolic Panel: Recent Labs  Lab 12/28/21 1845  NA 138  K 3.9  CL 102  CO2 32  GLUCOSE 93  BUN 36*  CREATININE 2.17*  CALCIUM 8.8*   Liver Function Tests: Recent Labs  Lab 12/28/21 1845  AST 18  ALT 16  ALKPHOS 60  BILITOT 0.6  PROT 6.7  ALBUMIN 3.4*   No results for input(s): LIPASE, AMYLASE in the last 168 hours. No results for input(s): AMMONIA in the last 168 hours. CBC: Recent Labs  Lab 12/28/21 1845  WBC 6.7  NEUTROABS 4.7  HGB 8.6*  HCT 28.0*  MCV 102.2*  PLT 137*   Cardiac Enzymes: No results for input(s): CKTOTAL, CKMB, CKMBINDEX, TROPONINI in the last 168 hours.  BNP (last 3 results) Recent Labs    08/08/21 1510 10/27/21 1952 12/28/21 1845  BNP 812.0* 497.0* 568.0*    ProBNP (last 3 results) No results for input(s): PROBNP in the last 8760 hours.  CBG: No results for input(s): GLUCAP in the last 168 hours.  Radiological Exams on Admission: DG Chest Portable 1 View  Result Date: 12/28/2021 CLINICAL DATA:  Shortness of breath. EXAM: PORTABLE CHEST 1 VIEW COMPARISON:  Chest x-ray 10/27/2021. FINDINGS: Sternotomy wires are present. The heart size and mediastinal contours are within normal limits. Both lungs are clear. The visualized skeletal structures are unremarkable. IMPRESSION: No active disease. Electronically  Signed   By: Ronney Asters M.D.   On: 12/28/2021 18:45    EKG: I independently viewed the EKG done and my findings are as followed: Sinus or ectopic atrial rhythm at a rate of 70 bpm  Assessment/Plan Present on Admission:  COPD with acute exacerbation ([place])  Acute kidney injury superimposed on CKD ([place])  Acute on chronic respiratory failure with hypoxia and hypercapnia ([place]) - baseline PCO2 80-90s, chronically on 2 L/min at home.  Principal Problem:   COPD with acute exacerbation ([place]) Active Problems:   Acute kidney injury superimposed on CKD ([place])   Acute on chronic respiratory failure with hypoxia and hypercapnia (HCC) - baseline PCO2 80-90s, chronically on 2 L/min at home.   Anemia   Macrocytic anemia   Thrombocytopenia (HCC)   Elevated brain natriuretic peptide (BNP) level   GERD (gastroesophageal reflux disease)   Hypoalbuminemia due to protein-calorie malnutrition (HCC)  Acute on chronic respiratory failure with hypoxia and hypercapnia possibly secondary to acute exacerbation  of COPD Continue duo nebs, Mucinex, Solu-Medrol, azithromycin. Continue Protonix to prevent steroid-induced ulcer Continue incentive spirometry and flutter valve Continue supplemental oxygen to maintain O2 sat > 92% with plan to wean patient off oxygen as tolerated  Acute kidney injury on CKD stage 3B BUN/creatinine 36/2.17 (baseline creatinine at 1.7-1.8) Renally adjust medications, avoid nephrotoxic agents/dehydration/hypotension  Elevated BNP History of HFpEF BNP 568 (was 812 about 4 months ago) Chest x-ray showed no active disease Continue total input/output, daily weights and fluid restriction Continue Cardiac diet  Echocardiogram done on 11/08/2022 showed LVEF of 65 to 70%.  LV has no RWMA. LV diastolic parameters consistent with G 2 DD   Macrocytic anemia MCV 102.2; folate and vitamin B12 levels will be checked  Thrombocytopenia possibly reactive Platelets 137, continue to monitor  platelet levels  Hypoalbuminemia possibly secondary to mild protein calorie malnutrition Protein supplement will be provided  Essential hypertension (controlled) Continue amlodipine  BPH Continue Flomax  GERD Continue Protonix  DVT prophylaxis: Lovenox  Code Status: Full code  Family Communication: None at bedside  Disposition Plan:  Patient is from:                        home Anticipated DC to:                   SNF or family members  home Anticipated DC date:               2-3 days Anticipated DC barriers:          Patient requires inpatient management due to acute exertional COPD requiring more supplemental oxygen compared to baseline  Consults called: None  Admission status: Observation    Bernadette Hoit MD [facility]  12/29/2021, 1:47 AM

## 2021-12-28 NOTE — ED Triage Notes (Signed)
Pt brought in by [facility] from home with c/o SOB since yesterday. EMS reports O2 sat dropped to mid to low 70s when ambulating. Pt wears 2L O2 continuously at all times. Inspiratory wheezing. BP 118/60, HR 60. COVID test negative yesterday at home. Duoneb treatment given by EMS.

## 2021-12-29 ENCOUNTER — Encounter (HOSPITAL_COMMUNITY): Payer: Self-pay | Admitting: Family Medicine

## 2021-12-29 DIAGNOSIS — R7989 Other specified abnormal findings of blood chemistry: Secondary | ICD-10-CM | POA: Diagnosis present

## 2021-12-29 DIAGNOSIS — R0602 Shortness of breath: Secondary | ICD-10-CM | POA: Diagnosis present

## 2021-12-29 DIAGNOSIS — Z7951 Long term (current) use of inhaled steroids: Secondary | ICD-10-CM | POA: Diagnosis not present

## 2021-12-29 DIAGNOSIS — Z9981 Dependence on supplemental oxygen: Secondary | ICD-10-CM | POA: Diagnosis not present

## 2021-12-29 DIAGNOSIS — Z7902 Long term (current) use of antithrombotics/antiplatelets: Secondary | ICD-10-CM | POA: Diagnosis not present

## 2021-12-29 DIAGNOSIS — Z951 Presence of aortocoronary bypass graft: Secondary | ICD-10-CM | POA: Diagnosis not present

## 2021-12-29 DIAGNOSIS — J9621 Acute and chronic respiratory failure with hypoxia: Secondary | ICD-10-CM | POA: Diagnosis present

## 2021-12-29 DIAGNOSIS — I482 Chronic atrial fibrillation, unspecified: Secondary | ICD-10-CM | POA: Diagnosis present

## 2021-12-29 DIAGNOSIS — I130 Hypertensive heart and chronic kidney disease with heart failure and stage 1 through stage 4 chronic kidney disease, or unspecified chronic kidney disease: Secondary | ICD-10-CM | POA: Diagnosis present

## 2021-12-29 DIAGNOSIS — E8809 Other disorders of plasma-protein metabolism, not elsewhere classified: Secondary | ICD-10-CM | POA: Diagnosis present

## 2021-12-29 DIAGNOSIS — J9622 Acute and chronic respiratory failure with hypercapnia: Secondary | ICD-10-CM | POA: Diagnosis present

## 2021-12-29 DIAGNOSIS — J441 Chronic obstructive pulmonary disease with (acute) exacerbation: Secondary | ICD-10-CM | POA: Diagnosis present

## 2021-12-29 DIAGNOSIS — K219 Gastro-esophageal reflux disease without esophagitis: Secondary | ICD-10-CM | POA: Diagnosis not present

## 2021-12-29 DIAGNOSIS — Z79899 Other long term (current) drug therapy: Secondary | ICD-10-CM | POA: Diagnosis not present

## 2021-12-29 DIAGNOSIS — F17210 Nicotine dependence, cigarettes, uncomplicated: Secondary | ICD-10-CM | POA: Diagnosis present

## 2021-12-29 DIAGNOSIS — D631 Anemia in chronic kidney disease: Secondary | ICD-10-CM | POA: Diagnosis present

## 2021-12-29 DIAGNOSIS — Z888 Allergy status to other drugs, medicaments and biological substances status: Secondary | ICD-10-CM | POA: Diagnosis not present

## 2021-12-29 DIAGNOSIS — D696 Thrombocytopenia, unspecified: Secondary | ICD-10-CM | POA: Diagnosis present

## 2021-12-29 DIAGNOSIS — E46 Unspecified protein-calorie malnutrition: Secondary | ICD-10-CM | POA: Diagnosis present

## 2021-12-29 DIAGNOSIS — E7800 Pure hypercholesterolemia, unspecified: Secondary | ICD-10-CM | POA: Diagnosis present

## 2021-12-29 DIAGNOSIS — H9190 Unspecified hearing loss, unspecified ear: Secondary | ICD-10-CM | POA: Diagnosis present

## 2021-12-29 DIAGNOSIS — N179 Acute kidney failure, unspecified: Secondary | ICD-10-CM | POA: Diagnosis present

## 2021-12-29 DIAGNOSIS — N1832 Chronic kidney disease, stage 3b: Secondary | ICD-10-CM | POA: Diagnosis present

## 2021-12-29 DIAGNOSIS — I5032 Chronic diastolic (congestive) heart failure: Secondary | ICD-10-CM | POA: Diagnosis present

## 2021-12-29 DIAGNOSIS — E441 Mild protein-calorie malnutrition: Secondary | ICD-10-CM | POA: Diagnosis present

## 2021-12-29 DIAGNOSIS — D539 Nutritional anemia, unspecified: Secondary | ICD-10-CM | POA: Diagnosis present

## 2021-12-29 DIAGNOSIS — Z20822 Contact with and (suspected) exposure to covid-19: Secondary | ICD-10-CM | POA: Diagnosis present

## 2021-12-29 DIAGNOSIS — E1122 Type 2 diabetes mellitus with diabetic chronic kidney disease: Secondary | ICD-10-CM | POA: Diagnosis present

## 2021-12-29 DIAGNOSIS — Z7901 Long term (current) use of anticoagulants: Secondary | ICD-10-CM | POA: Diagnosis not present

## 2021-12-29 LAB — PHOSPHORUS: Phosphorus: 3.5 mg/dL (ref 2.5–4.6)

## 2021-12-29 LAB — COMPREHENSIVE METABOLIC PANEL
ALT: 14 U/L (ref 0–44)
AST: 16 U/L (ref 15–41)
Albumin: 3.2 g/dL — ABNORMAL LOW (ref 3.5–5.0)
Alkaline Phosphatase: 55 U/L (ref 38–126)
Anion gap: 10 (ref 5–15)
BUN: 35 mg/dL — ABNORMAL HIGH (ref 8–23)
CO2: 29 mmol/L (ref 22–32)
Calcium: 8.9 mg/dL (ref 8.9–10.3)
Chloride: 100 mmol/L (ref 98–111)
Creatinine, Ser: 2.66 mg/dL — ABNORMAL HIGH (ref 0.61–1.24)
GFR, Estimated: 24 mL/min — ABNORMAL LOW (ref >60)
Glucose, Bld: 180 mg/dL — ABNORMAL HIGH (ref 70–99)
Potassium: 4.3 mmol/L (ref 3.5–5.1)
Sodium: 139 mmol/L (ref 135–145)
Total Bilirubin: 1.1 mg/dL (ref 0.3–1.2)
Total Protein: 6.2 g/dL — ABNORMAL LOW (ref 6.5–8.1)

## 2021-12-29 LAB — APTT: aPTT: 34 seconds (ref 24–36)

## 2021-12-29 LAB — CBC
HCT: 27.9 % — ABNORMAL LOW (ref 39.0–52.0)
Hemoglobin: 8 g/dL — ABNORMAL LOW (ref 13.0–17.0)
MCH: 29.6 pg (ref 26.0–34.0)
MCHC: 28.7 g/dL — ABNORMAL LOW (ref 30.0–36.0)
MCV: 103.3 fL — ABNORMAL HIGH (ref 80.0–100.0)
Platelets: 149 10*3/uL — ABNORMAL LOW (ref 150–400)
RBC: 2.7 MIL/uL — ABNORMAL LOW (ref 4.22–5.81)
RDW: 17.3 % — ABNORMAL HIGH (ref 11.5–15.5)
WBC: 6.2 10*3/uL (ref 4.0–10.5)
nRBC: 0 % (ref 0.0–0.2)

## 2021-12-29 LAB — FOLATE: Folate: 9.6 ng/mL (ref >5.9)

## 2021-12-29 LAB — VITAMIN B12: Vitamin B-12: 477 pg/mL (ref 180–914)

## 2021-12-29 LAB — MAGNESIUM: Magnesium: 1.9 mg/dL (ref 1.7–2.4)

## 2021-12-29 MED ORDER — ORAL CARE MOUTH RINSE
15.0000 mL | Freq: Two times a day (BID) | OROMUCOSAL | Status: DC
  Administered 2021-12-29 – 2021-12-31 (×4): 15 mL via OROMUCOSAL

## 2021-12-29 MED ORDER — DM-GUAIFENESIN ER 30-600 MG PO TB12
1.0000 | ORAL_TABLET | Freq: Two times a day (BID) | ORAL | Status: DC
  Administered 2021-12-29 – 2022-01-01 (×7): 1 via ORAL
  Filled 2021-12-29 (×7): qty 1

## 2021-12-29 MED ORDER — MONTELUKAST SODIUM 10 MG PO TABS
10.0000 mg | ORAL_TABLET | Freq: Every day | ORAL | Status: DC
  Administered 2021-12-29 – 2021-12-31 (×3): 10 mg via ORAL
  Filled 2021-12-29 (×3): qty 1

## 2021-12-29 MED ORDER — PANTOPRAZOLE SODIUM 40 MG PO TBEC
40.0000 mg | DELAYED_RELEASE_TABLET | Freq: Two times a day (BID) | ORAL | Status: DC
  Administered 2021-12-29 – 2022-01-01 (×7): 40 mg via ORAL
  Filled 2021-12-29 (×7): qty 1

## 2021-12-29 MED ORDER — PANTOPRAZOLE SODIUM 40 MG PO TBEC: 40.0000 mg | DELAYED_RELEASE_TABLET | Freq: Every day | ORAL | Status: DC

## 2021-12-29 MED ORDER — IPRATROPIUM-ALBUTEROL 0.5-2.5 (3) MG/3ML IN SOLN: 3.0000 mL | RESPIRATORY_TRACT | Status: DC | PRN

## 2021-12-29 MED ORDER — AZITHROMYCIN 250 MG PO TABS
250.0000 mg | ORAL_TABLET | Freq: Every day | ORAL | Status: DC
  Administered 2021-12-30 – 2022-01-01 (×3): 250 mg via ORAL
  Filled 2021-12-29 (×4): qty 1

## 2021-12-29 MED ORDER — IPRATROPIUM-ALBUTEROL 0.5-2.5 (3) MG/3ML IN SOLN
3.0000 mL | Freq: Three times a day (TID) | RESPIRATORY_TRACT | Status: DC
  Administered 2021-12-29 – 2022-01-01 (×11): 3 mL via RESPIRATORY_TRACT
  Filled 2021-12-29 (×11): qty 3

## 2021-12-29 MED ORDER — AZITHROMYCIN 250 MG PO TABS
500.0000 mg | ORAL_TABLET | Freq: Every day | ORAL | Status: AC
  Administered 2021-12-29: 500 mg via ORAL
  Filled 2021-12-29: qty 2

## 2021-12-29 MED ORDER — ACETAMINOPHEN 325 MG PO TABS
650.0000 mg | ORAL_TABLET | Freq: Four times a day (QID) | ORAL | Status: DC | PRN
  Administered 2021-12-29: 650 mg via ORAL
  Filled 2021-12-29: qty 2

## 2021-12-29 MED ORDER — METHYLPREDNISOLONE SODIUM SUCC 40 MG IJ SOLR
40.0000 mg | Freq: Two times a day (BID) | INTRAMUSCULAR | Status: DC
  Administered 2021-12-29 – 2021-12-30 (×3): 40 mg via INTRAVENOUS
  Filled 2021-12-29 (×4): qty 1

## 2021-12-29 MED ORDER — AMLODIPINE BESYLATE 5 MG PO TABS
5.0000 mg | ORAL_TABLET | Freq: Every day | ORAL | Status: DC
  Administered 2021-12-29 – 2022-01-01 (×4): 5 mg via ORAL
  Filled 2021-12-29 (×4): qty 1

## 2021-12-29 MED ORDER — CHLORHEXIDINE GLUCONATE 0.12 % MT SOLN
15.0000 mL | Freq: Two times a day (BID) | OROMUCOSAL | Status: DC
  Administered 2021-12-29 – 2022-01-01 (×7): 15 mL via OROMUCOSAL
  Filled 2021-12-29 (×7): qty 15

## 2021-12-29 MED ORDER — TAMSULOSIN HCL 0.4 MG PO CAPS
0.4000 mg | ORAL_CAPSULE | Freq: Every day | ORAL | Status: DC
  Administered 2021-12-29 – 2021-12-31 (×3): 0.4 mg via ORAL
  Filled 2021-12-29 (×3): qty 1

## 2021-12-29 MED ORDER — ENOXAPARIN SODIUM 30 MG/0.3ML IJ SOSY
30.0000 mg | PREFILLED_SYRINGE | INTRAMUSCULAR | Status: DC
  Administered 2021-12-29 – 2022-01-01 (×4): 30 mg via SUBCUTANEOUS
  Filled 2021-12-29 (×4): qty 0.3

## 2021-12-29 MED ORDER — MOMETASONE FURO-FORMOTEROL FUM 200-5 MCG/ACT IN AERO
2.0000 | INHALATION_SPRAY | Freq: Two times a day (BID) | RESPIRATORY_TRACT | Status: DC
  Administered 2021-12-29 – 2022-01-01 (×7): 2 via RESPIRATORY_TRACT
  Filled 2021-12-29: qty 8.8

## 2021-12-29 MED ORDER — ENSURE ENLIVE PO LIQD
237.0000 mL | Freq: Two times a day (BID) | ORAL | Status: DC
  Administered 2021-12-30 – 2021-12-31 (×3): 237 mL via ORAL

## 2021-12-29 MED ORDER — IPRATROPIUM-ALBUTEROL 0.5-2.5 (3) MG/3ML IN SOLN
3.0000 mL | Freq: Four times a day (QID) | RESPIRATORY_TRACT | Status: DC
  Administered 2021-12-29: 3 mL via RESPIRATORY_TRACT
  Filled 2021-12-29: qty 3

## 2021-12-29 NOTE — Assessment & Plan Note (Signed)
Stable on protonix

## 2021-12-29 NOTE — Progress Notes (Signed)
Patient medicated for leg pain once this shift, no shortness of breath noted.

## 2021-12-29 NOTE — Progress Notes (Signed)
Subjective/Objective Continues to have shortness  of breath symptoms. Cough and chest congestion better with addition of tessalon perles.     Physical Exam Vitals reviewed.  Constitutional:      Appearance: He is well-developed.  HENT:     Head: Normocephalic and atraumatic.     Mouth/Throat:     Mouth: Mucous membranes are moist.  Cardiovascular:     Rate and Rhythm: Normal rate.  Pulmonary:     Effort: No respiratory distress.     Breath sounds: No stridor. Wheezing present.  Abdominal:     Palpations: Abdomen is soft.  Musculoskeletal:        General: Normal range of motion.     Cervical back: Normal range of motion.  Skin:    General: Skin is warm.  Neurological:     Mental Status: He is alert.      Scheduled Meds:  amLODipine  5 mg Oral Daily   [START ON 12/30/2021] azithromycin  250 mg Oral Daily   chlorhexidine  15 mL Mouth Rinse BID   dextromethorphan-guaiFENesin  1 tablet Oral BID   enoxaparin (LOVENOX) injection  30 mg Subcutaneous Q24H   feeding supplement  237 mL Oral BID BM   ipratropium-albuterol  3 mL Nebulization TID   mouth rinse  15 mL Mouth Rinse q12n4p   methylPREDNISolone (SOLU-MEDROL) injection  40 mg Intravenous Q12H   mometasone-formoterol  2 puff Inhalation BID   montelukast  10 mg Oral QHS   pantoprazole  40 mg Oral BID   tamsulosin  0.4 mg Oral QPC supper   Continuous Infusions: PRN Meds:ipratropium-albuterol  Vital signs in last 24 hours: Temp:  [97.9 F (36.6 C)-98.4 F (36.9 C)] 97.9 F (36.6 C) (02/01 0406) Pulse Rate:  [68-87] 87 (02/01 0406) Resp:  [15-24] 20 (02/01 0406) BP: (100-133)/(53-78) 100/55 (02/01 0406) SpO2:  [92 %-100 %] 98 % (02/01 0850) Weight:  [75.8 kg-76.2 kg] 76.2 kg (02/01 0125)  Intake/Output last 3 shifts: No intake/output data recorded. Intake/Output this shift: Total I/O In: 240 [P.O.:240] Out: -   Problem Assessment/Plan Respiratory Acute on chronic respiratory failure with hypoxia  and hypercapnia (HCC) - baseline PCO2 80-90s, chronically on 2 L/min at home. Assessment & Plan Continue current treatments   * COPD with acute exacerbation ([place]) Assessment & Plan Continue with severe SOB Continue bronchodilators, steroids Not ready for home yet   Digestive GERD (gastroesophageal reflux disease) Assessment & Plan Stable on protonix   Genitourinary Acute kidney injury superimposed on CKD ([place]) Assessment & Plan Renally adjust medications, avoid nephrotoxic agents/dehydration/hypotension  Other Macrocytic anemia Assessment & Plan I32 and folic acid within normal limits   Elevated brain natriuretic peptide (BNP) level Assessment & Plan History of HFpEF BNP 568 (was 812 about 4 months ago) Chest x-ray showed no active disease Continue total input/output, daily weights and fluid restriction Continue Cardiac diet             Echocardiogram done on 11/08/2022 showed LVEF of 65 to 70%.  LV has no RWMA. LV diastolic parameters consistent with G 2 DD

## 2021-12-29 NOTE — Assessment & Plan Note (Signed)
History of HFpEF BNP 568 (was 812 about 4 months ago) Chest x-ray showed no active disease Continue total input/output, daily weights and fluid restriction Continue Cardiac diet             Echocardiogram done on 11/08/2022 showed LVEF of 65 to 70%.  LV has no RWMA. LV diastolic parameters consistent with G 2 DD

## 2021-12-29 NOTE — Assessment & Plan Note (Signed)
F09 and folic acid within normal limits

## 2021-12-29 NOTE — Assessment & Plan Note (Signed)
Continue current treatments

## 2021-12-29 NOTE — Subjective & Objective (Signed)
Continues to have shortness  of breath symptoms. Cough and chest congestion better with addition of tessalon perles.     Physical Exam Vitals reviewed.  Constitutional:      Appearance: He is well-developed.  HENT:     Head: Normocephalic and atraumatic.     Mouth/Throat:     Mouth: Mucous membranes are moist.  Cardiovascular:     Rate and Rhythm: Normal rate.  Pulmonary:     Effort: No respiratory distress.     Breath sounds: No stridor. Wheezing present.  Abdominal:     Palpations: Abdomen is soft.  Musculoskeletal:        General: Normal range of motion.     Cervical back: Normal range of motion.  Skin:    General: Skin is warm.  Neurological:     Mental Status: He is alert.

## 2021-12-29 NOTE — Assessment & Plan Note (Signed)
Continue with severe SOB Continue bronchodilators, steroids Not ready for home yet

## 2021-12-29 NOTE — TOC Progression Note (Signed)
Transition of Care [facility]) - Progression Note    Patient Details  Name: Daniel Barker MRN: [id number] Date of Birth: [birth year]  Transition of Care [facility]) CM/SW Contact  Salome Arnt, [place] Phone Number: 12/29/2021, 10:42 AM  Clinical Narrative:    Transition of Care [facility]) Screening Note   Patient Details  Name: Daniel Barker Date of Birth: October 14, 1946   Transition of Care [facility]) CM/SW Contact:    Salome Arnt, [place] Phone Number: 12/29/2021, 10:42 AM    Transition of Care Department [facility]) has reviewed patient and no TOC needs have been identified at this time. We will continue to monitor patient advancement through interdisciplinary progression rounds. If new patient transition needs arise, please place a TOC consult.        Barriers to Discharge: Continued Medical Work up  Expected Discharge Plan and Services                                                 Social Determinants of Health (SDOH) Interventions    Readmission Risk Interventions Readmission Risk Prevention Plan 10/28/2021 09/20/2021 08/12/2021  Transportation Screening Complete Complete Complete  PCP or Specialist Appt within 5-7 Days - - -  Home Care Screening - - -  Medication Review ([place]) Complete Complete Complete  PCP or Specialist appointment within 3-5 days of discharge - Complete -  [place] or Home Care Consult Complete Complete Complete  SW Recovery Care/Counseling Consult Complete - Complete  Palliative Care Screening Not Applicable - Not [place] Not Applicable - Not Applicable  Some recent data might be hidden

## 2021-12-29 NOTE — Assessment & Plan Note (Signed)
Renally adjust medications, avoid nephrotoxic agents/dehydration/hypotension

## 2021-12-30 MED ORDER — METHYLPREDNISOLONE SODIUM SUCC 125 MG IJ SOLR
60.0000 mg | Freq: Two times a day (BID) | INTRAMUSCULAR | Status: DC
  Administered 2021-12-30 – 2022-01-01 (×4): 60 mg via INTRAVENOUS
  Filled 2021-12-30 (×4): qty 2

## 2021-12-30 NOTE — Assessment & Plan Note (Signed)
Stable on protonix

## 2021-12-30 NOTE — Progress Notes (Signed)
Subjective/Objective Pt reports ongoing shortness of breath with attempting to walk a few feet in the room.  Ongoing cough and wheezing not improved from  yesterday.    Scheduled Meds:  amLODipine  5 mg Oral Daily   azithromycin  250 mg Oral Daily   chlorhexidine  15 mL Mouth Rinse BID   dextromethorphan-guaiFENesin  1 tablet Oral BID   enoxaparin (LOVENOX) injection  30 mg Subcutaneous Q24H   feeding supplement  237 mL Oral BID BM   ipratropium-albuterol  3 mL Nebulization TID   mouth rinse  15 mL Mouth Rinse q12n4p   methylPREDNISolone (SOLU-MEDROL) injection  60 mg Intravenous Q12H   mometasone-formoterol  2 puff Inhalation BID   montelukast  10 mg Oral QHS   pantoprazole  40 mg Oral BID   tamsulosin  0.4 mg Oral QPC supper   Continuous Infusions: PRN Meds:acetaminophen, ipratropium-albuterol  Vital signs in last 24 hours: Temp:  [97.6 F (36.4 C)-98.2 F (36.8 C)] 97.9 F (36.6 C) (02/02 0526) Pulse Rate:  [77-88] 77 (02/02 0526) Resp:  [16-22] 22 (02/02 0526) BP: (106-132)/(49-63) 132/63 (02/02 0526) SpO2:  [93 %-100 %] 98 % (02/02 0853) Weight:  [75.8 kg] 75.8 kg (02/02 0526)  Intake/Output last 3 shifts: I/O last 3 completed shifts: In: 104 [P.O.:960] Out: 2000 [Urine:2000] Intake/Output this shift: Total I/O In: 88 [P.O.:240] Out: -  76 y.o. 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  He uses 2 L of oxygen at baseline, patient states that he has been increasing his oxygen need to 4 L/min without much improvement.  Shortness of breath worsens with exertion, patient continues to smoke cigarettes.  Cough was nonproductive, he denies  fever, chills, chest pain, headache, nausea, vomiting, abdominal pain.   In the emergency department, he was hemodynamically stable.  Work-up in the ED showed macrocytic anemia, thrombocytopenia, BUN/creatinine 36/2.17 (baseline creatinine at 1.7-1.8), BNP 568 (was 812 about 4 months ago), FOBT was negative, troponin x2 was within normal range.  Influenza A, B, SARS coronavirus 2 was negative. Chest x-ray showed no active disease Patient was treated with IV Solu-Medrol 20 mg x 1, breathing treatment was provided.  Hospitalist was asked to admit patient for further evaluation and management.     General: Appearance:    Awake, alert, cooperative, frail appearing.   Frail male in no acute distress  Eyes:    PERRL, conjunctiva/corneas clear, EOM's intact       Lungs:     Diffuse expiratory wheezing heard bilaterally.   Heart:    Normal heart rate. Regular rhythm. No murmurs, rubs, or gallops.   MS:   All extremities are intact.    Neurologic:   Awake, alert, oriented x 3. No apparent focal neurological           defect.       Problem Assessment/Plan Respiratory Acute on chronic respiratory failure with hypoxia and hypercapnia (HCC) - baseline PCO2 80-90s, chronically on 2 L/min at home. Assessment & Plan Continue current treatments   * COPD with acute exacerbation ([place]) Assessment & Plan Continue with severe SOB - no significant improvement today Continue bronchodilators, steroids Not ready for home yet  Increase solumedrol to 60 mg BID  Digestive GERD (gastroesophageal reflux disease) Assessment & Plan Stable on protonix   Genitourinary Acute kidney injury superimposed on CKD [facility]) Assessment & Plan Renally adjust medications, avoid nephrotoxic agents/dehydration/hypotension  Other Elevated brain natriuretic peptide (BNP) level Assessment & Plan History of HFpEF - stable BNP 568 (was 812 about 4 months ago) Chest x-ray showed no active disease Continue total  input/output, daily weights and fluid restriction Continue Cardiac diet             Echocardiogram done on 11/08/2022 showed LVEF of 65 to 70%.  LV has no RWMA. LV diastolic parameters consistent with G 2 DD

## 2021-12-30 NOTE — Assessment & Plan Note (Signed)
Renally adjust medications, avoid nephrotoxic agents/dehydration/hypotension

## 2021-12-30 NOTE — Assessment & Plan Note (Signed)
Continue current treatments

## 2021-12-30 NOTE — Hospital Course (Addendum)
76 y.o. 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  He uses 2 L of oxygen at baseline, patient states that he has been increasing his oxygen need to 4 L/min without much improvement.  Shortness of breath worsens with exertion, patient continues to smoke cigarettes.  Cough was nonproductive, he denies fever, chills, chest pain, headache, nausea, vomiting, abdominal pain.   In the emergency department, he was hemodynamically stable.  Work-up in the ED showed macrocytic anemia, thrombocytopenia, BUN/creatinine 36/2.17 (baseline creatinine at 1.7-1.8), BNP 568 (was 812 about 4 months ago), FOBT was negative, troponin x2 was within normal range.  Influenza A, B, SARS coronavirus 2 was negative. Chest x-ray showed no active disease Patient was treated with IV Solu-Medrol 20 mg x 1, breathing treatment was provided.  Hospitalist was asked to admit patient for further evaluation and management.

## 2021-12-30 NOTE — Subjective & Objective (Signed)
Pt reports ongoing shortness of breath with attempting to walk a few feet in the room.  Ongoing cough and wheezing not improved from  yesterday.

## 2021-12-30 NOTE — Assessment & Plan Note (Signed)
History of HFpEF - stable BNP 568 (was 812 about 4 months ago) Chest x-ray showed no active disease Continue total input/output, daily weights and fluid restriction Continue Cardiac diet             Echocardiogram done on 11/08/2022 showed LVEF of 65 to 70%.  LV has no RWMA. LV diastolic parameters consistent with G 2 DD

## 2021-12-30 NOTE — Assessment & Plan Note (Signed)
Continue with severe SOB - no significant improvement today Continue bronchodilators, steroids Not ready for home yet  Increase solumedrol to 60 mg BID

## 2021-12-31 MED ORDER — TORSEMIDE 20 MG PO TABS
10.0000 mg | ORAL_TABLET | Freq: Every evening | ORAL | Status: DC
  Administered 2021-12-31: 10 mg via ORAL
  Filled 2021-12-31: qty 1

## 2021-12-31 MED ORDER — PROPAFENONE HCL 225 MG PO TABS
225.0000 mg | ORAL_TABLET | Freq: Three times a day (TID) | ORAL | Status: DC
  Administered 2021-12-31 – 2022-01-01 (×4): 225 mg via ORAL
  Filled 2021-12-31 (×11): qty 1

## 2021-12-31 MED ORDER — CLOPIDOGREL BISULFATE 75 MG PO TABS
75.0000 mg | ORAL_TABLET | Freq: Every day | ORAL | Status: DC
  Administered 2021-12-31 – 2022-01-01 (×2): 75 mg via ORAL
  Filled 2021-12-31 (×2): qty 1

## 2021-12-31 MED ORDER — POTASSIUM CHLORIDE CRYS ER 20 MEQ PO TBCR
20.0000 meq | EXTENDED_RELEASE_TABLET | Freq: Every day | ORAL | Status: DC
  Administered 2021-12-31 – 2022-01-01 (×2): 20 meq via ORAL
  Filled 2021-12-31 (×2): qty 1

## 2021-12-31 MED ORDER — ZINC SULFATE 220 (50 ZN) MG PO CAPS
220.0000 mg | ORAL_CAPSULE | Freq: Every day | ORAL | Status: DC
  Administered 2021-12-31 – 2022-01-01 (×2): 220 mg via ORAL
  Filled 2021-12-31 (×2): qty 1

## 2021-12-31 MED ORDER — TORSEMIDE 20 MG PO TABS
20.0000 mg | ORAL_TABLET | Freq: Every morning | ORAL | Status: DC
  Administered 2021-12-31 – 2022-01-01 (×2): 20 mg via ORAL
  Filled 2021-12-31 (×2): qty 1

## 2021-12-31 MED ORDER — BISOPROLOL FUMARATE 5 MG PO TABS
2.5000 mg | ORAL_TABLET | Freq: Every day | ORAL | Status: DC
  Administered 2021-12-31 – 2022-01-01 (×2): 2.5 mg via ORAL
  Filled 2021-12-31 (×2): qty 1

## 2021-12-31 NOTE — Assessment & Plan Note (Signed)
Renally adjust medications, avoid nephrotoxic agents/dehydration/hypotension Stable

## 2021-12-31 NOTE — Plan of Care (Signed)

## 2021-12-31 NOTE — TOC Initial Note (Signed)
Transition of Care [facility]) - Initial/Assessment Note    Patient Details  Name: Daniel Barker MRN: [id number] Date of Birth: 06/02/1946  Transition of Care [facility]) CM/SW Contact:    Shade Flood, LCSW Phone Number: 12/31/2021, 1:33 PM  Clinical Narrative:                  Pt admitted from home and MD states pt will likely be stable for dc home tomorrow. Pt is currently active with [facility] for RN at home. PT recommending HH PT at dc. Spoke with pt today to review dc planning. Pt aware of likely dc tomorrow. He requests to resume [place] with his current provider. Pt states that family will transport home.   Pt does not believe he will have any other TOC needs for dc. Weekend TOC will be available to assist if needs arise.  Expected Discharge Plan: [place] Barriers to Discharge: Continued Medical Work up   Patient Goals and CMS Choice Patient states their goals for this hospitalization and ongoing recovery are:: go home CMS Medicare.gov Compare Post Acute Care list provided to:: Patient    Expected Discharge Plan and Services Expected Discharge Plan: [place] In-house Referral: Clinical Social Work   Post Acute Care Choice: Resumption of Svcs/PTA Provider Living arrangements for the past 2 months: Single Family Home                                      Prior Living Arrangements/Services Living arrangements for the past 2 months: Single Family Home Lives with:: Spouse Patient language and need for interpreter reviewed:: Yes Do you feel safe going back to the place where you live?: Yes      Need for Family Participation in Patient Care: No (Comment) Care giver support system in place?: No (comment) Current home services: DME, Home RN Criminal Activity/Legal Involvement Pertinent to Current Situation/Hospitalization: No - Comment as needed  Activities of Daily Living Home Assistive Devices/Equipment: Hearing aid, Oxygen, Walker  (specify type), Nebulizer (rolling walker) ADL Screening (condition at time of admission) Patient's cognitive ability adequate to safely complete daily activities?: Yes Is the patient deaf or have difficulty hearing?: Yes Does the patient have difficulty seeing, even when wearing glasses/contacts?: No Does the patient have difficulty concentrating, remembering, or making decisions?: No Patient able to express need for assistance with ADLs?: Yes Does the patient have difficulty dressing or bathing?: No Independently performs ADLs?: Yes (appropriate for developmental age) Does the patient have difficulty walking or climbing stairs?: No Weakness of Legs: Both Weakness of Arms/Hands: None  Permission Sought/Granted                  Emotional Assessment   Attitude/Demeanor/Rapport: Engaged Affect (typically observed): Pleasant Orientation: : Oriented to Self, Oriented to Place, Oriented to  Time, Oriented to Situation Alcohol / Substance Use: Not Applicable Psych Involvement: No (comment)  Admission diagnosis:  COPD exacerbation ([place]) [J44.1] COPD with acute exacerbation ([place]) [J44.1] Anemia, unspecified type [D64.9] Patient Active Problem List   Diagnosis Date Noted   Macrocytic anemia 12/29/2021   Thrombocytopenia ([place]) 12/29/2021   Elevated brain natriuretic peptide (BNP) level 12/29/2021   GERD (gastroesophageal reflux disease) 12/29/2021   Hypoalbuminemia due to protein-calorie malnutrition ([place]) 12/29/2021   COPD with acute exacerbation ([place]) 12/28/2021   Upper respiratory infection 11/15/2021   Productive cough 11/11/2021   Congestive heart  failure ([place]) 11/09/2021   (HFpEF) heart failure with preserved ejection fraction - EF 65 to 70 % with Grade 2 DD 10/29/2021   Hypermagnesemia 10/28/2021   Acute exacerbation of CHF (congestive heart failure) ([place]) 10/27/2021   Hypokalemia 09/17/2021   Anemia 09/17/2021   History of peripheral edema 67/59/1638   Acute diastolic  CHF (congestive heart failure) ([place]) 09/17/2021   Acute exacerbation of chronic obstructive airways disease ([place]) 09/16/2021   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 08/18/2021   Steroid-induced hyperglycemia 08/18/2021   Acute hypoxemic respiratory failure ([place]) 08/09/2021   Hypoproteinemia ([place]) 07/18/2021   Acute respiratory failure with hypoxia and hypercarbia ([place]) 07/17/2021   COPD exacerbation ([place]) 07/17/2021   Chronic kidney disease, stage 3a ([place]) 07/03/2021   Acute on chronic diastolic CHF (congestive heart failure) ([place]) 07/03/2021   History of fracture 07/03/2021   SOB (shortness of breath) 06/05/2021   Elevated troponin 06/05/2021   Acute on chronic respiratory failure with hypoxia and hypercapnia (HCC) - baseline PCO2 80-90s, chronically on 2 L/min at home. 05/19/2021   COVID-19 virus infection 05/18/2021   Essential tremor 05/04/2021   Paroxysmal atrial fibrillation ([place]) 04/25/2021   Chronic kidney disease 04/25/2021   Asthma 04/25/2021   H/O coronary angioplasty 04/25/2021   Impaired fasting glucose 04/25/2021   Cellulitis 04/25/2021   Edema 04/25/2021   Nicotine dependence 04/25/2021   Overweight 04/25/2021   Prediabetes 04/25/2021   Tremor 04/25/2021   Vitamin D deficiency 04/25/2021   Mixed hyperlipidemia 04/16/2021   Closed displaced spiral fracture of shaft of left humerus 12/23/2020   Chronic respiratory failure with hypoxia ([place]) 09/02/2020   Urinary retention 07/08/2020   Benign prostatic hyperplasia with lower urinary tract symptoms 07/08/2020   COPD (chronic obstructive pulmonary disease) ([place]) 05/28/2020   Cigarette smoker 05/28/2020   Acute kidney injury superimposed on CKD ([place]) 06/03/2014   Anemia of chronic disease 06/03/2014   Atherosclerosis of coronary artery without angina pectoris 06/03/2014   Acute respiratory failure with hypercapnia ([place]) 06/01/2014   Type 2 diabetes mellitus ([place]) 06/01/2014   Low blood pressure 06/01/2014   PCP:  [name], MD Pharmacy:   [facility] [place], [place] AT [place] [address] [place] [address] Phone: [phone] Fax: [phone]     Social Determinants of Health (SDOH) Interventions    Readmission Risk Interventions Readmission Risk Prevention Plan 12/31/2021 10/28/2021 09/20/2021  Transportation Screening Complete Complete Complete  PCP or Specialist Appt within 5-7 Days - - -  Home Care Screening - - -  Medication Review ([place]) Complete Complete Complete  PCP or Specialist appointment within 3-5 days of discharge - - Complete  [place]RI or Home Care Consult Complete Complete Complete  SW Recovery Care/Counseling Consult Complete Complete -  Palliative Care Screening Not Applicable Not Applicable -  [place] Not Applicable Not Applicable -  Some recent data might be hidden

## 2021-12-31 NOTE — Progress Notes (Signed)
PROGRESS NOTE   Daniel Barker  WYO:[id number] DOB: 06/06/1946 DOA: 12/28/2021 PCP: [name], MD   Chief Complaint  Patient presents with   Shortness of Breath   Level of care: Med-Surg  Brief Admission History:  76 y.o. 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  He uses 2 L of oxygen at baseline, patient states that he has been increasing his oxygen need to 4 L/min without much improvement.  Shortness of breath worsens with exertion, patient continues to smoke cigarettes.  Cough was nonproductive, he denies fever, chills, chest pain, headache, nausea, vomiting, abdominal pain.   In the emergency department, he was hemodynamically stable.  Work-up in the ED showed macrocytic anemia, thrombocytopenia, BUN/creatinine 36/2.17 (baseline creatinine at 1.7-1.8), BNP 568 (was 812 about 4 months ago), FOBT was negative, troponin x2 was within normal range.  Influenza A, B, SARS coronavirus 2 was negative. Chest x-ray showed no active disease Patient was treated with IV Solu-Medrol 20 mg x 1, breathing treatment was provided.  Hospitalist was asked to admit patient for further evaluation and management.    Assessment & Plan:   Assessment and Plan: * COPD with acute exacerbation ([place])- (present on admission) Continue with severe SOB - long conversation with patient that he may benefit from outpatient palliative care.   Continue bronchodilators, steroids Not ready for home yet but possibly tomorrow  Continue IV solumedrol to 60 mg BID   Acute on chronic respiratory failure with hypoxia and hypercapnia (HCC) - baseline PCO2 80-90s, chronically on 2 L/min at home.- (present on  admission) Continue current treatments   Acute kidney injury superimposed on CKD ([place])- (present on admission) Renally adjust medications, avoid nephrotoxic agents/dehydration/hypotension Stable  Elevated brain natriuretic peptide (BNP) level- (present on admission) History of HFpEF - stable BNP 568 (was 812 about 4 months ago) Chest x-ray showed no active disease Continue total input/output, daily weights and fluid restriction Continue Cardiac diet             Echocardiogram done on 11/08/2022 showed LVEF of 65 to 70%.  LV has no RWMA. LV diastolic parameters consistent with G 2 DD   GERD (gastroesophageal reflux disease)- (present on admission) Stable on protonix   Macrocytic anemia- (present on admission) D74 and folic acid within normal limits    DVT prophylaxis: lovenox Code Status: full  Family Communication: [name] updated 2/3 Disposition: anticipate home with [place] 2/4  Status is: Inpatient Remains inpatient appropriate because: IV steroids     Consultants:    Procedures:    Antimicrobials:   azithromycin  Subjective: Pt remains very SOB but moving air a little better today.    Objective: Vitals:   12/31/21 0835 12/31/21 0845 12/31/21 1253 12/31/21 1440  BP:   128/62   Pulse:   76   Resp:   15   Temp:   98.2 F (36.8 C)   TempSrc:      SpO2: 98% 100% 97% 97%  Weight:      Height:        Intake/Output Summary (Last 24 hours) at 12/31/2021 1523 Last data filed at 12/31/2021 1300 Gross per 24 hour  Intake 1200 ml  Output 2700 ml  Net -1500 ml  Filed Weights   12/29/21 0125 12/30/21 0526 12/31/21 0521  Weight: 76.2 kg 75.8 kg 75.7 kg    Examination:  General exam: frail elderly male, Appears calm and comfortable  Respiratory system: bibasilar wheezing heard. No increased work of breathing.  Cardiovascular system: normal S1 & S2 heard. No JVD, murmurs, rubs, gallops or clicks. No pedal edema. Gastrointestinal system: Abdomen is nondistended, soft  and nontender. No organomegaly or masses felt. Normal bowel sounds heard. Central nervous system: Alert and oriented. No focal neurological deficits. Extremities: Symmetric 5 x 5 power. Skin: No rashes, lesions or ulcers. Psychiatry: Judgement and insight appear normal. Mood & affect appropriate.   Data Reviewed: I have personally reviewed following labs and imaging studies  CBC: Recent Labs  Lab 12/28/21 1845 12/29/21 0437  WBC 6.7 6.2  NEUTROABS 4.7  --   HGB 8.6* 8.0*  HCT 28.0* 27.9*  MCV 102.2* 103.3*  PLT 137* 149*    Basic Metabolic Panel: Recent Labs  Lab 12/28/21 1845 12/29/21 0437  NA 138 139  K 3.9 4.3  CL 102 100  CO2 32 29  GLUCOSE 93 180*  BUN 36* 35*  CREATININE 2.17* 2.66*  CALCIUM 8.8* 8.9  MG  --  1.9  PHOS  --  3.5    CBG: No results for input(s): GLUCAP in the last 168 hours.  Recent Results (from the past 240 hour(s))  Resp Panel by RT-PCR (Flu A&B, Covid) Nasopharyngeal Swab     Status: None   Collection Time: 12/28/21  6:37 PM   Specimen: Nasopharyngeal Swab; Nasopharyngeal(NP) swabs in vial transport medium  Result Value Ref Range Status   SARS Coronavirus 2 by RT PCR NEGATIVE NEGATIVE Final    Comment: (NOTE) SARS-CoV-2 target nucleic acids are NOT DETECTED.  The SARS-CoV-2 RNA is generally detectable in upper respiratory specimens during the acute phase of infection. The lowest concentration of SARS-CoV-2 viral copies this assay can detect is 138 copies/mL. A negative result does not preclude SARS-Cov-2 infection and should not be used as the sole basis for treatment or other patient management decisions. A negative result may occur with  improper specimen collection/handling, submission of specimen other than nasopharyngeal swab, presence of viral mutation(s) within the areas targeted by this assay, and inadequate number of viral copies(<138 copies/mL). A negative result must be combined with clinical observations, patient  history, and epidemiological information. The expected result is Negative.  Fact Sheet for Patients:  EntrepreneurPulse.com.au  Fact Sheet for Healthcare Providers:  IncredibleEmployment.be  This test is no t yet approved or cleared by the [place] FDA and  has been authorized for detection and/or diagnosis of SARS-CoV-2 by FDA under an Emergency Use Authorization (EUA). This EUA will remain  in effect (meaning this test can be used) for the duration of the COVID-19 declaration under Section 564(b)(1) of the Act, 21 U.S.C.section 360bbb-3(b)(1), unless the authorization is terminated  or revoked sooner.       Influenza A by PCR NEGATIVE NEGATIVE Final   Influenza B by PCR NEGATIVE NEGATIVE Final    Comment: (NOTE) The Xpert Xpress SARS-CoV-2/FLU/RSV plus assay is intended as an aid in the diagnosis of influenza from Nasopharyngeal swab specimens and should not be used as a sole basis for treatment. Nasal washings and aspirates are unacceptable for Xpert Xpress SARS-CoV-2/FLU/RSV testing.  Fact Sheet for Patients: EntrepreneurPulse.com.au  Fact Sheet for Healthcare Providers: IncredibleEmployment.be  This test is not yet approved or cleared by the [place] FDA and has been  authorized for detection and/or diagnosis of SARS-CoV-2 by FDA under an Emergency Use Authorization (EUA). This EUA will remain in effect (meaning this test can be used) for the duration of the COVID-19 declaration under Section 564(b)(1) of the Act, 21 U.S.C. section 360bbb-3(b)(1), unless the authorization is terminated or revoked.  Performed at [facility], [address], [place], [place] [address]      Radiology Studies: No results found.  Scheduled Meds:  amLODipine  5 mg Oral Daily   azithromycin  250 mg Oral Daily   bisoprolol  2.5 mg Oral Daily   chlorhexidine  15 mL Mouth Rinse BID   clopidogrel  75 mg Oral  Daily   dextromethorphan-guaiFENesin  1 tablet Oral BID   enoxaparin (LOVENOX) injection  30 mg Subcutaneous Q24H   feeding supplement  237 mL Oral BID BM   ipratropium-albuterol  3 mL Nebulization TID   mouth rinse  15 mL Mouth Rinse q12n4p   methylPREDNISolone (SOLU-MEDROL) injection  60 mg Intravenous Q12H   mometasone-formoterol  2 puff Inhalation BID   montelukast  10 mg Oral QHS   pantoprazole  40 mg Oral BID   potassium chloride SA  20 mEq Oral Daily   propafenone  225 mg Oral Q8H   tamsulosin  0.4 mg Oral QPC supper   torsemide  10 mg Oral QPM   torsemide  20 mg Oral q AM   zinc sulfate  220 mg Oral Daily   Continuous Infusions:   LOS: 2 days   Time spent: 35 mins  Adia Crammer Wynetta Emery, MD How to contact the [facility] Attending or Consulting provider [place] or covering provider during after hours [place], for this patient?  Check the care team in [facility] and look for a) attending/consulting TRH provider listed and b) the [facility] team listed Log into [email or web address] and use [place]'s universal password to access. If you do not have the password, please contact the hospital operator. Locate the [place] provider you are looking for under [facility] and page to a number that you can be directly reached. If you still have difficulty reaching the provider, please page the [facility] (Director on Call) for the Hospitalists listed on amion for assistance.  12/31/2021, 3:23 PM

## 2021-12-31 NOTE — Progress Notes (Signed)
Patient alert/oriented, back to his baseline oxygen. Requiring longer recovery time from activity than usual, but oxygen sats are staying normal.

## 2021-12-31 NOTE — Care Management Important Message (Signed)
Important Message  Patient Details  Name: Aaryn Sermon MRN: [id number] Date of Birth: 1946-02-06   Medicare Important Message Given:  Yes  Attempted to reach patient via room phone to review Medicare IM, though unable to reach each time.  Reached out to Tonny Bollman, daughter, at [phone] and reviewed Medicare IM.  Copy placed in mail to home address on file.    Dannette Barbara 12/31/2021, 1:53 PM

## 2021-12-31 NOTE — Assessment & Plan Note (Addendum)
Continue with severe SOB but seems to be back at his baseline - long conversation with patient and daughter that he would benefit from outpatient palliative care.  They are agreeable.  Consulted to [facility] for arranging outpatient palliative care.  DC home today.    Continue bronchodilators, steroids, antibiotics.  Long steroid taper prescribed.  Doxycycline.  Refilled bronchodilators per patient request.  He has nebulizer and oxygen at home.   Outpatient follow up with Dr. [name] his pulmonologist.  Not ready for home yet but possibly tomorrow  Continue IV solumedrol to 60 mg BID

## 2021-12-31 NOTE — Progress Notes (Signed)
Patient has been stable during shift.  Express concerns about his breathing and when he will be back at baseline.  Patient ambulated to bathroom with walker.  Vitals have been stable.  Patient still has congested/rhonchi breath sounds.

## 2021-12-31 NOTE — Assessment & Plan Note (Addendum)
Continue current treatments as he seems to feel much closer back to his baseline.    Outpatient palliative care recommended for patient and I had recommended DNR/DNI given his severe end stage lung disease.

## 2021-12-31 NOTE — Evaluation (Signed)
Physical Therapy Evaluation Patient Details Name: Daniel Barker MRN: [id number] DOB: 1946/11/13 Today's Date: 12/31/2021  History of Present Illness  Tibor Lemmons is a 76 y.o. 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  He uses 2 L of oxygen at baseline, patient states that he has been increasing his oxygen need to 4 L/min without much improvement.  Shortness of breath worsens with exertion, patient continues to smoke cigarettes.  Cough was nonproductive, he denies fever, chills, chest pain, headache, nausea, vomiting, abdominal pain.   Clinical Impression  Patient functioning near baseline for functional mobility and gait demonstrating good return for bed mobility, transferring to chair and ambulation in room using RW without loss of balance while on 2 LPM with SpO2 at 96%.  Patient tolerated sitting up in chair after therapy and encouraged to ambulate with nursing staff as tolerated for length of stay.  Plan:  Patient discharged from physical therapy to care of nursing for ambulation daily as tolerated for length of stay.          Recommendations for follow up therapy are one component of a multi-disciplinary discharge planning process, led by the attending physician.  Recommendations may be updated based on patient status, additional functional criteria and insurance authorization.  Follow Up Recommendations Home health PT    Assistance Recommended at Discharge Set up Supervision/Assistance  Patient can return home with the following  A little help with walking and/or transfers;A little help with bathing/dressing/bathroom;Help with stairs or ramp for entrance;Assistance  with cooking/housework    Equipment Recommendations None recommended by PT  Recommendations for Other Services       Functional Status Assessment Patient has not had a recent decline in their functional status     Precautions / Restrictions Precautions Precautions: Fall Restrictions Weight Bearing Restrictions: No      Mobility  Bed Mobility Overal bed mobility: Modified Independent                  Transfers Overall transfer level: Modified independent                      Ambulation/Gait Ambulation/Gait assistance: Modified independent (Device/Increase time), Supervision Gait Distance (Feet): 25 Feet Assistive device: Rolling walker (2 wheels) Gait Pattern/deviations: Decreased step length - right, Decreased step length - left, Decreased stride length Gait velocity: decreased     General Gait Details: slightly labored cadence without loss of balance, on 2 LPM O2 with SpO2 at 96%, limited mostly due to fatigue  Stairs            Wheelchair Mobility    Modified Rankin (Stroke Patients Only)       Balance Overall balance assessment: Needs assistance Sitting-balance support: Feet supported, No upper extremity supported Sitting balance-Leahy Scale: Good Sitting balance - Comments: seated at EOB   Standing balance support: During functional activity, No upper extremity supported Standing balance-Leahy Scale: Fair Standing balance comment: fair/good using RW                             Pertinent Vitals/Pain Pain Assessment Pain Assessment: No/denies pain    Home Living Family/patient expects to be discharged  to:: Private residence Living Arrangements: Spouse/significant other Available Help at Discharge: Family;Available PRN/intermittently Type of Home: House Home Access: Stairs to enter Entrance Stairs-Rails: Right;Left;Can reach both Entrance Stairs-Number of Steps: 3   Home Layout: One level Home Equipment: Chartered certified accountant (2 wheels);Cane - single point;BSC/3in1;Grab bars - tub/shower;Shower seat      Prior Function Prior Level of Function : Independent/Modified Independent             Mobility Comments: Scientist, research (life sciences) RW ADLs Comments: assisted by family     Hand Dominance   Dominant Hand: Left    Extremity/Trunk Assessment   Upper Extremity Assessment Upper Extremity Assessment: Overall WFL for tasks assessed    Lower Extremity Assessment Lower Extremity Assessment: Overall WFL for tasks assessed    Cervical / Trunk Assessment Cervical / Trunk Assessment: Normal  Communication   Communication: HOH  Cognition Arousal/Alertness: Awake/alert Behavior During Therapy: WFL for tasks assessed/performed Overall Cognitive Status: Within Functional Limits for tasks assessed                                          General Comments      Exercises     Assessment/Plan    PT Assessment All further PT needs can be met in the next venue of care  PT Problem List Decreased strength;Decreased activity tolerance;Decreased balance;Decreased mobility;Cardiopulmonary status limiting activity       PT Treatment Interventions      PT Goals (Current goals can be found in the Care Plan section)  Acute Rehab PT Goals Patient Stated Goal: return home with family to assist PT Goal Formulation: With patient Time For Goal Achievement: 12/31/21 Potential to Achieve Goals: Good    Frequency       Co-evaluation               AM-PAC PT "6 Clicks" Mobility  Outcome Measure Help needed turning from your back to your side while in a flat bed without using bedrails?: None Help needed moving from lying on your back to sitting on the side of a flat bed without using bedrails?: None Help needed moving to and from a bed to a chair (including a wheelchair)?: None Help needed standing up from a chair using your arms (e.g., wheelchair or bedside chair)?: None Help  needed to walk in hospital room?: A Little Help needed climbing 3-5 steps with a railing? : A Little 6 Click Score: 22    End of Session Equipment Utilized During Treatment: Oxygen Activity Tolerance: Patient tolerated treatment well;Patient limited by fatigue Patient left: in chair;with call bell/phone within reach Nurse Communication: Mobility status PT Visit Diagnosis: Unsteadiness on feet (R26.81);Other abnormalities of gait and mobility (R26.89);Muscle weakness (generalized) (M62.81)    Time: 1245-8099 PT Time Calculation (min) (ACUTE ONLY): 12 min   Charges:   PT Evaluation $PT Eval Low Complexity: 1 Low PT Treatments $Therapeutic Activity: 8-22 mins        12:27 PM, 12/31/21 Lonell Grandchild, MPT Physical Therapist with [facility] [phone] office [phone] mobile phone

## 2022-01-01 MED ORDER — ALBUTEROL SULFATE (2.5 MG/3ML) 0.083% IN NEBU: 2.5000 mg | INHALATION_SOLUTION | Freq: Four times a day (QID) | RESPIRATORY_TRACT | 2 refills | Status: AC | PRN

## 2022-01-01 MED ORDER — ALBUTEROL SULFATE HFA 108 (90 BASE) MCG/ACT IN AERS: 2.0000 | INHALATION_SPRAY | RESPIRATORY_TRACT | 3 refills | Status: AC | PRN

## 2022-01-01 MED ORDER — GUAIFENESIN ER 600 MG PO TB12: 1200.0000 mg | ORAL_TABLET | Freq: Two times a day (BID) | ORAL | 0 refills | Status: AC

## 2022-01-01 MED ORDER — PREDNISONE 20 MG PO TABS: ORAL_TABLET | ORAL | 0 refills | Status: DC

## 2022-01-01 MED ORDER — DOXYCYCLINE HYCLATE 100 MG PO CAPS: 100.0000 mg | ORAL_CAPSULE | Freq: Two times a day (BID) | ORAL | 0 refills | Status: AC

## 2022-01-01 NOTE — Discharge Summary (Signed)
Physician Discharge Summary   Patient: Daniel Barker MRN: [id number] DOB: 1946/01/17  Admit date:     12/28/2021  Discharge date: 01/01/22  Discharge Physician: Irwin Brakeman   PCP: [name], MD  Pulmonologist: Dr. Gwenlyn Perking [facility]   Recommendations at discharge:   Please follow up with Dr. [name] in 1-2 weeks at pulmonary clinic Continue outpatient palliative care services Follow up with PCP in 1 week.   Discharge Diagnoses: Principal Problem:   COPD with acute exacerbation ([place]) Active Problems:   Acute on chronic respiratory failure with hypoxia and hypercapnia (HCC) - baseline PCO2 80-90s, chronically on 2 L/min at home.   Acute kidney injury superimposed on CKD (HCC)   Elevated brain natriuretic peptide (BNP) level   GERD (gastroesophageal reflux disease)   Hypoalbuminemia due to protein-calorie malnutrition (HCC)   Anemia   Thrombocytopenia (HCC)   Macrocytic anemia  Resolved Problems:   * No resolved hospital problems. *  Hospital Course: 75 y.o. 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  He uses 2 L of oxygen at baseline, patient states that he has been increasing his oxygen need to 4 L/min without much improvement.  Shortness of breath worsens with exertion, patient continues to smoke cigarettes.  Cough was nonproductive, he denies fever, chills, chest pain, headache, nausea, vomiting, abdominal pain.   In the emergency department, he was hemodynamically stable.  Work-up in the ED showed macrocytic anemia, thrombocytopenia, BUN/creatinine 36/2.17 (baseline creatinine at 1.7-1.8), BNP 568 (was 812 about 4 months ago), FOBT was negative,  troponin x2 was within normal range.  Influenza A, B, SARS coronavirus 2 was negative. Chest x-ray showed no active disease Patient was treated with IV Solu-Medrol 20 mg x 1, breathing treatment was provided.  Hospitalist was asked to admit patient for further evaluation and management.   Assessment and Plan: * COPD with acute exacerbation ([place])- (present on admission) Continue with severe SOB but seems to be back at his baseline - long conversation with patient and daughter that he would benefit from outpatient palliative care.  They are agreeable.  Consulted to [facility] for arranging outpatient palliative care.  DC home today.    Continue bronchodilators, steroids, antibiotics.  Long steroid taper prescribed.  Doxycycline.  Refilled bronchodilators per patient request.  He has nebulizer and oxygen at home.   Outpatient follow up with Dr. [name] his pulmonologist.  Not ready for home yet but possibly tomorrow  Continue IV solumedrol to 60 mg BID   Acute on chronic respiratory failure with hypoxia and hypercapnia (HCC) - baseline PCO2 80-90s, chronically on 2 L/min at home.- (present on admission) Continue current treatments as he seems to feel much closer back to his baseline.    Outpatient palliative care recommended for patient and I had recommended DNR/DNI given his severe end stage lung disease.     Acute kidney injury superimposed on CKD ([place])- (present on admission) Renally adjust medications, avoid nephrotoxic agents/dehydration/hypotension Stable  Elevated brain natriuretic peptide (BNP) level- (present on admission) History of HFpEF - stable BNP 568 (was 812 about 4 months ago) Chest x-ray showed no active disease Continue total input/output, daily weights and fluid restriction Continue Cardiac diet  Echocardiogram done on 11/08/2022 showed LVEF of 65 to 70%.  LV has no RWMA. LV diastolic parameters consistent with G 2 DD   GERD (gastroesophageal reflux disease)- (present  on admission) Stable on protonix   Macrocytic anemia- (present on admission) S01 and folic acid within normal limits        Consultants: n/a Procedures performed: n/a  Disposition: Home Diet recommendation:  Cardiac diet  DISCHARGE MEDICATION: Allergies as of 01/01/2022       Reactions   Lipitor [atorvastatin]    Unknown reaction        Medication List     STOP taking these medications    dextromethorphan-guaiFENesin 30-600 MG 12hr tablet Commonly known as: [place] DM       TAKE these medications    acetaminophen 500 MG tablet Commonly known as: TYLENOL Take 1,000 mg by mouth every 6 (six) hours as needed for mild pain.   albuterol (2.5 MG/3ML) 0.083% nebulizer solution Commonly known as: PROVENTIL Take 3 mLs (2.5 mg total) by nebulization every 6 (six) hours as needed for wheezing or shortness of breath. DX: J44.9. What changed: additional instructions   albuterol 108 (90 Base) MCG/ACT inhaler Commonly known as: VENTOLIN HFA Inhale 2 puffs into the lungs every 4 (four) hours as needed for wheezing or shortness of breath. What changed: reasons to take this   amLODipine 5 MG tablet Commonly known as: NORVASC Take 1 tablet (5 mg total) by mouth daily.   bisoprolol 5 MG tablet Commonly known as: ZEBETA Take 0.5 tablets (2.5 mg total) by mouth daily. HOLD FOR  HEART RATE LESS THAN 65 OR SBP LESS THAN 95   budesonide-formoterol 160-4.5 MCG/ACT inhaler Commonly known as: Symbicort USE 2 PUFFS BY MOUTH EVERY MORNING AND 2 PUFFS EVERY EVENING   clopidogrel 75 MG tablet Commonly known as: PLAVIX Take 1 tablet (75 mg total) by mouth daily.   Compressor/Nebulizer Misc Inhale 1 Units into the lungs as needed.   doxycycline 100 MG capsule Commonly known as: VIBRAMYCIN Take 1 capsule (100 mg total) by mouth 2 (two) times daily for 4 days.   fenofibrate micronized 134 MG capsule Commonly known as: LOFIBRA Take 134 mg by mouth daily before breakfast.    guaiFENesin 600 MG 12 hr tablet Commonly known as: Mucinex Take 2 tablets (1,200 mg total) by mouth 2 (two) times daily for 4 days.   montelukast 10 MG tablet Commonly known as: SINGULAIR Take 10 mg by mouth at bedtime.   pantoprazole 40 MG tablet Commonly known as: PROTONIX Take 1 tablet (40 mg total) by mouth 2 (two) times daily.   polyethylene glycol powder 17 GM/SCOOP powder Commonly known as: GLYCOLAX/MIRALAX Take 1 Container by mouth in the morning and at bedtime.   potassium chloride SA 20 MEQ tablet Commonly known as: KLOR-CON M Take 1 tablet (20 mEq total) by mouth daily. Take will taking Torsemide/Demadex Fluid Medicine   predniSONE 20 MG tablet Commonly known as: DELTASONE Take 3 PO QAM x5days, 2 PO QAM x5days, 1 PO QAM x5days Start taking on: January 02, 2022   propafenone 225 MG tablet Commonly known as: RYTHMOL Take 225 mg by mouth every 8 (eight) hours.   Spiriva Respimat 2.5 MCG/ACT Aers Generic drug: Tiotropium Bromide Monohydrate Inhale 2 puffs into the lungs daily.   tamsulosin 0.4 MG Caps capsule Commonly known as: FLOMAX Take 1 capsule (0.4 mg total) by mouth daily after supper.   torsemide 20 MG tablet Commonly known as: Demadex Take 1 tablet (  20 mg total) by mouth See admin instructions. Take 1 Tablet 20 mg qam and half tab (10 mg) every evening   zinc sulfate 220 (50 Zn) MG capsule Take 1 capsule (220 mg total) by mouth daily.        Follow-up Information     [facility] Follow up.   Why: [facility] will continue to provide nursing and Physical Therapy at home. They will call you to arrange visits.        [name], MD. Schedule an appointment as soon as possible for a visit in 1 week(s).   Specialty: Internal Medicine Why: Hospital Follow Up Contact information: [address] [place] [facility] [address] [phone]         Josue Hector, MD .   Specialty: Cardiology Contact information: [phone] [address] [place] [address] [place] [address] [phone]         Tanda Rockers, MD. Schedule an appointment as soon as possible for a visit in 1 week(s).   Specialty: Pulmonary Disease Why: Hospital Follow Up Contact information: [address] [place] [address] [phone]                 Discharge Exam: Danley Danker Weights   12/30/21 0526 12/31/21 0521 01/01/22 0502  Weight: 75.8 kg 75.7 kg 75.5 kg   General exam: frail elderly male, Appears calm and comfortable  Respiratory system: no increased work of breathing, less wheezing heard and better air movement.  Cardiovascular system: normal S1 & S2 heard. No JVD, murmurs, rubs, gallops or clicks. No pedal edema. Gastrointestinal system: Abdomen is nondistended, soft and nontender. No organomegaly or masses felt. Normal bowel sounds heard. Central nervous system: Alert and oriented. No focal neurological deficits. Extremities: Symmetric 5 x 5 power. Skin: No rashes, lesions or ulcers. Psychiatry: Judgement and insight appear normal. Mood & affect appropriate.  Condition at discharge: stable  The results of significant diagnostics from this hospitalization (including imaging, microbiology, ancillary and laboratory) are listed below for reference.   Imaging Studies: DG Chest Portable 1 View  Result Date: 12/28/2021 CLINICAL DATA:  Shortness of breath. EXAM: PORTABLE CHEST 1 VIEW COMPARISON:  Chest x-ray 10/27/2021. FINDINGS: Sternotomy wires are present. The heart size and mediastinal contours are within normal limits. Both lungs are clear. The visualized skeletal structures are unremarkable. IMPRESSION: No active disease. Electronically Signed   By: Ronney Asters M.D.   On: 12/28/2021 18:45    Microbiology: Results for orders placed or performed during the hospital encounter of 12/28/21  Resp Panel by RT-PCR (Flu A&B, Covid) Nasopharyngeal Swab     Status: None   Collection Time: 12/28/21  6:37 PM    Specimen: Nasopharyngeal Swab; Nasopharyngeal(NP) swabs in vial transport medium  Result Value Ref Range Status   SARS Coronavirus 2 by RT PCR NEGATIVE NEGATIVE Final    Comment: (NOTE) SARS-CoV-2 target nucleic acids are NOT DETECTED.  The SARS-CoV-2 RNA is generally detectable in upper respiratory specimens during the acute phase of infection. The lowest concentration of SARS-CoV-2 viral copies this assay can detect is 138 copies/mL. A negative result does not preclude SARS-Cov-2 infection and should not be used as the sole basis for treatment or other patient management decisions. A negative result may occur with  improper specimen collection/handling, submission of specimen other than nasopharyngeal swab, presence of viral mutation(s) within the areas targeted by this assay, and inadequate number of viral copies(<138 copies/mL). A negative result  must be combined with clinical observations, patient history, and epidemiological information. The expected result is Negative.  Fact Sheet for Patients:  EntrepreneurPulse.com.au  Fact Sheet for Healthcare Providers:  IncredibleEmployment.be  This test is no t yet approved or cleared by the [place] FDA and  has been authorized for detection and/or diagnosis of SARS-CoV-2 by FDA under an Emergency Use Authorization (EUA). This EUA will remain  in effect (meaning this test can be used) for the duration of the COVID-19 declaration under Section 564(b)(1) of the Act, 21 U.S.C.section 360bbb-3(b)(1), unless the authorization is terminated  or revoked sooner.       Influenza A by PCR NEGATIVE NEGATIVE Final   Influenza B by PCR NEGATIVE NEGATIVE Final    Comment: (NOTE) The Xpert Xpress SARS-CoV-2/FLU/RSV plus assay is intended as an aid in the diagnosis of influenza from Nasopharyngeal swab specimens and should not be used as a sole basis for treatment. Nasal washings and aspirates are  unacceptable for Xpert Xpress SARS-CoV-2/FLU/RSV testing.  Fact Sheet for Patients: EntrepreneurPulse.com.au  Fact Sheet for Healthcare Providers: IncredibleEmployment.be  This test is not yet approved or cleared by the [place] FDA and has been authorized for detection and/or diagnosis of SARS-CoV-2 by FDA under an Emergency Use Authorization (EUA). This EUA will remain in effect (meaning this test can be used) for the duration of the COVID-19 declaration under Section 564(b)(1) of the Act, 21 U.S.C. section 360bbb-3(b)(1), unless the authorization is terminated or revoked.  Performed at [facility], [address] [place],  [address]     Labs: CBC: Recent Labs  Lab 12/28/21 1845 12/29/21 0437  WBC 6.7 6.2  NEUTROABS 4.7  --   HGB 8.6* 8.0*  HCT 28.0* 27.9*  MCV 102.2* 103.3*  PLT 137* 110*   Basic Metabolic Panel: Recent Labs  Lab 12/28/21 1845 12/29/21 0437  NA 138 139  K 3.9 4.3  CL 102 100  CO2 32 29  GLUCOSE 93 180*  BUN 36* 35*  CREATININE 2.17* 2.66*  CALCIUM 8.8* 8.9  MG  --  1.9  PHOS  --  3.5   Liver Function Tests: Recent Labs  Lab 12/28/21 1845 12/29/21 0437  AST 18 16  ALT 16 14  ALKPHOS 60 55  BILITOT 0.6 1.1  PROT 6.7 6.2*  ALBUMIN 3.4* 3.2*   CBG: No results for input(s): GLUCAP in the last 168 hours.  Discharge time spent: greater than 30 minutes.  Signed: Irwin Brakeman, MD [facility] 01/01/2022

## 2022-01-01 NOTE — Assessment & Plan Note (Signed)
History of HFpEF - stable BNP 568 (was 812 about 4 months ago) Chest x-ray showed no active disease Continue total input/output, daily weights and fluid restriction Continue Cardiac diet             Echocardiogram done on 11/08/2022 showed LVEF of 65 to 70%.  LV has no RWMA. LV diastolic parameters consistent with G 2 DD

## 2022-01-01 NOTE — Progress Notes (Signed)
Patient spoken to via phone by Dr. Wynetta Emery and myself in regards to patient concerns. No further concerns expressed. Patient calling family to come pick him up.

## 2022-01-01 NOTE — Assessment & Plan Note (Signed)
Stable on protonix

## 2022-01-01 NOTE — Discharge Instructions (Signed)

## 2022-01-01 NOTE — Progress Notes (Signed)
Patient still very congested sounding. His mood has declined from yesterday, declined several medications this morning and states he is going home this afternoon.

## 2022-01-01 NOTE — Progress Notes (Signed)
Patient having some loose stools, notified Dr. Wynetta Emery. Recommendation for probiotics given.

## 2022-01-01 NOTE — Progress Notes (Addendum)
CSW received consult for an outpatient palliative referral.   CSW contacted patient's daughter [name] who states patient is active with [facility] already for outpatient palliative and that someone from the agency visits once a month. [name] states she will assist patient with transportation home. CSW explained [facility] services and that those will be provided by [place]. All questions answered.  CSW notified Amy of [facility] of patient's discharge.  Madilyn Fireman, MSW, LCSW Transitions of Care   Clinical Social Worker II [phone]

## 2022-01-03 DIAGNOSIS — I4891 Unspecified atrial fibrillation: Secondary | ICD-10-CM | POA: Diagnosis not present

## 2022-01-03 DIAGNOSIS — N189 Chronic kidney disease, unspecified: Secondary | ICD-10-CM | POA: Diagnosis not present

## 2022-01-03 DIAGNOSIS — Z7409 Other reduced mobility: Secondary | ICD-10-CM | POA: Diagnosis not present

## 2022-01-03 DIAGNOSIS — I10 Essential (primary) hypertension: Secondary | ICD-10-CM | POA: Diagnosis not present

## 2022-01-03 DIAGNOSIS — E782 Mixed hyperlipidemia: Secondary | ICD-10-CM | POA: Diagnosis not present

## 2022-01-03 DIAGNOSIS — I509 Heart failure, unspecified: Secondary | ICD-10-CM | POA: Diagnosis not present

## 2022-01-03 DIAGNOSIS — Z9981 Dependence on supplemental oxygen: Secondary | ICD-10-CM | POA: Diagnosis not present

## 2022-01-03 DIAGNOSIS — J449 Chronic obstructive pulmonary disease, unspecified: Secondary | ICD-10-CM | POA: Diagnosis not present

## 2022-01-03 DIAGNOSIS — E1122 Type 2 diabetes mellitus with diabetic chronic kidney disease: Secondary | ICD-10-CM | POA: Diagnosis not present

## 2022-01-04 ENCOUNTER — Telehealth: Payer: Self-pay | Admitting: Internal Medicine

## 2022-01-04 NOTE — Telephone Encounter (Signed)
Pt needing a hospital follow-up appt but wanted to change to Dr. Halford Chessman or Dr. Elsworth Soho per referral from Dr. Wynetta Emery in [place].  Please advise.  Pt aware we will contact him once approved and is ok to wait until April for Dr. Elsworth Soho or Dr. Halford Chessman.   Dr. [name], please advise if this is okay

## 2022-01-04 NOTE — Telephone Encounter (Signed)
Fine with me

## 2022-01-05 ENCOUNTER — Other Ambulatory Visit: Payer: Self-pay | Admitting: Internal Medicine

## 2022-01-05 DIAGNOSIS — E782 Mixed hyperlipidemia: Secondary | ICD-10-CM | POA: Diagnosis not present

## 2022-01-05 DIAGNOSIS — I4891 Unspecified atrial fibrillation: Secondary | ICD-10-CM | POA: Diagnosis not present

## 2022-01-05 DIAGNOSIS — Z7409 Other reduced mobility: Secondary | ICD-10-CM | POA: Diagnosis not present

## 2022-01-05 DIAGNOSIS — Z9981 Dependence on supplemental oxygen: Secondary | ICD-10-CM | POA: Diagnosis not present

## 2022-01-05 DIAGNOSIS — I10 Essential (primary) hypertension: Secondary | ICD-10-CM | POA: Diagnosis not present

## 2022-01-05 DIAGNOSIS — J449 Chronic obstructive pulmonary disease, unspecified: Secondary | ICD-10-CM | POA: Diagnosis not present

## 2022-01-05 DIAGNOSIS — N189 Chronic kidney disease, unspecified: Secondary | ICD-10-CM | POA: Diagnosis not present

## 2022-01-05 DIAGNOSIS — E1122 Type 2 diabetes mellitus with diabetic chronic kidney disease: Secondary | ICD-10-CM | POA: Diagnosis not present

## 2022-01-05 DIAGNOSIS — I509 Heart failure, unspecified: Secondary | ICD-10-CM | POA: Diagnosis not present

## 2022-01-05 NOTE — Telephone Encounter (Signed)
Tonya notified that patient can see Dr. Halford Chessman.  Nothing further needed.

## 2022-01-05 NOTE — Telephone Encounter (Signed)
Fine with me

## 2022-01-10 DIAGNOSIS — I10 Essential (primary) hypertension: Secondary | ICD-10-CM | POA: Diagnosis not present

## 2022-01-10 DIAGNOSIS — Z7409 Other reduced mobility: Secondary | ICD-10-CM | POA: Diagnosis not present

## 2022-01-10 DIAGNOSIS — E1122 Type 2 diabetes mellitus with diabetic chronic kidney disease: Secondary | ICD-10-CM | POA: Diagnosis not present

## 2022-01-10 DIAGNOSIS — E782 Mixed hyperlipidemia: Secondary | ICD-10-CM | POA: Diagnosis not present

## 2022-01-10 DIAGNOSIS — I4891 Unspecified atrial fibrillation: Secondary | ICD-10-CM | POA: Diagnosis not present

## 2022-01-10 DIAGNOSIS — N189 Chronic kidney disease, unspecified: Secondary | ICD-10-CM | POA: Diagnosis not present

## 2022-01-10 DIAGNOSIS — Z9981 Dependence on supplemental oxygen: Secondary | ICD-10-CM | POA: Diagnosis not present

## 2022-01-10 DIAGNOSIS — J449 Chronic obstructive pulmonary disease, unspecified: Secondary | ICD-10-CM | POA: Diagnosis not present

## 2022-01-10 DIAGNOSIS — I509 Heart failure, unspecified: Secondary | ICD-10-CM | POA: Diagnosis not present

## 2022-01-12 DIAGNOSIS — N189 Chronic kidney disease, unspecified: Secondary | ICD-10-CM | POA: Diagnosis not present

## 2022-01-12 DIAGNOSIS — E782 Mixed hyperlipidemia: Secondary | ICD-10-CM | POA: Diagnosis not present

## 2022-01-12 DIAGNOSIS — E1122 Type 2 diabetes mellitus with diabetic chronic kidney disease: Secondary | ICD-10-CM | POA: Diagnosis not present

## 2022-01-12 DIAGNOSIS — J449 Chronic obstructive pulmonary disease, unspecified: Secondary | ICD-10-CM | POA: Diagnosis not present

## 2022-01-12 DIAGNOSIS — I4891 Unspecified atrial fibrillation: Secondary | ICD-10-CM | POA: Diagnosis not present

## 2022-01-12 DIAGNOSIS — Z7409 Other reduced mobility: Secondary | ICD-10-CM | POA: Diagnosis not present

## 2022-01-12 DIAGNOSIS — I509 Heart failure, unspecified: Secondary | ICD-10-CM | POA: Diagnosis not present

## 2022-01-12 DIAGNOSIS — I10 Essential (primary) hypertension: Secondary | ICD-10-CM | POA: Diagnosis not present

## 2022-01-12 DIAGNOSIS — Z9981 Dependence on supplemental oxygen: Secondary | ICD-10-CM | POA: Diagnosis not present

## 2022-01-16 DIAGNOSIS — N183 Chronic kidney disease, stage 3 unspecified: Secondary | ICD-10-CM | POA: Diagnosis not present

## 2022-01-16 DIAGNOSIS — I4891 Unspecified atrial fibrillation: Secondary | ICD-10-CM | POA: Diagnosis not present

## 2022-01-16 DIAGNOSIS — J441 Chronic obstructive pulmonary disease with (acute) exacerbation: Secondary | ICD-10-CM | POA: Diagnosis not present

## 2022-01-16 DIAGNOSIS — I10 Essential (primary) hypertension: Secondary | ICD-10-CM | POA: Diagnosis not present

## 2022-01-16 DIAGNOSIS — Z9981 Dependence on supplemental oxygen: Secondary | ICD-10-CM | POA: Diagnosis not present

## 2022-01-16 DIAGNOSIS — J9621 Acute and chronic respiratory failure with hypoxia: Secondary | ICD-10-CM | POA: Diagnosis not present

## 2022-01-16 DIAGNOSIS — J9622 Acute and chronic respiratory failure with hypercapnia: Secondary | ICD-10-CM | POA: Diagnosis not present

## 2022-01-16 DIAGNOSIS — E1122 Type 2 diabetes mellitus with diabetic chronic kidney disease: Secondary | ICD-10-CM | POA: Diagnosis not present

## 2022-01-16 DIAGNOSIS — I5032 Chronic diastolic (congestive) heart failure: Secondary | ICD-10-CM | POA: Diagnosis not present

## 2022-01-16 DIAGNOSIS — Z7409 Other reduced mobility: Secondary | ICD-10-CM | POA: Diagnosis not present

## 2022-01-17 DIAGNOSIS — J9621 Acute and chronic respiratory failure with hypoxia: Secondary | ICD-10-CM | POA: Diagnosis not present

## 2022-01-17 DIAGNOSIS — Z7409 Other reduced mobility: Secondary | ICD-10-CM | POA: Diagnosis not present

## 2022-01-17 DIAGNOSIS — I10 Essential (primary) hypertension: Secondary | ICD-10-CM | POA: Diagnosis not present

## 2022-01-17 DIAGNOSIS — Z9981 Dependence on supplemental oxygen: Secondary | ICD-10-CM | POA: Diagnosis not present

## 2022-01-17 DIAGNOSIS — E1122 Type 2 diabetes mellitus with diabetic chronic kidney disease: Secondary | ICD-10-CM | POA: Diagnosis not present

## 2022-01-17 DIAGNOSIS — J9622 Acute and chronic respiratory failure with hypercapnia: Secondary | ICD-10-CM | POA: Diagnosis not present

## 2022-01-17 DIAGNOSIS — I4891 Unspecified atrial fibrillation: Secondary | ICD-10-CM | POA: Diagnosis not present

## 2022-01-17 DIAGNOSIS — J441 Chronic obstructive pulmonary disease with (acute) exacerbation: Secondary | ICD-10-CM | POA: Diagnosis not present

## 2022-01-17 DIAGNOSIS — N183 Chronic kidney disease, stage 3 unspecified: Secondary | ICD-10-CM | POA: Diagnosis not present

## 2022-01-17 DIAGNOSIS — I5032 Chronic diastolic (congestive) heart failure: Secondary | ICD-10-CM | POA: Diagnosis not present

## 2022-01-18 DIAGNOSIS — J441 Chronic obstructive pulmonary disease with (acute) exacerbation: Secondary | ICD-10-CM | POA: Diagnosis not present

## 2022-01-18 DIAGNOSIS — I5032 Chronic diastolic (congestive) heart failure: Secondary | ICD-10-CM | POA: Diagnosis not present

## 2022-01-18 DIAGNOSIS — Z9981 Dependence on supplemental oxygen: Secondary | ICD-10-CM | POA: Diagnosis not present

## 2022-01-18 DIAGNOSIS — Z7409 Other reduced mobility: Secondary | ICD-10-CM | POA: Diagnosis not present

## 2022-01-18 DIAGNOSIS — N183 Chronic kidney disease, stage 3 unspecified: Secondary | ICD-10-CM | POA: Diagnosis not present

## 2022-01-18 DIAGNOSIS — J9622 Acute and chronic respiratory failure with hypercapnia: Secondary | ICD-10-CM | POA: Diagnosis not present

## 2022-01-18 DIAGNOSIS — I10 Essential (primary) hypertension: Secondary | ICD-10-CM | POA: Diagnosis not present

## 2022-01-18 DIAGNOSIS — J9621 Acute and chronic respiratory failure with hypoxia: Secondary | ICD-10-CM | POA: Diagnosis not present

## 2022-01-18 DIAGNOSIS — E1122 Type 2 diabetes mellitus with diabetic chronic kidney disease: Secondary | ICD-10-CM | POA: Diagnosis not present

## 2022-01-18 DIAGNOSIS — I4891 Unspecified atrial fibrillation: Secondary | ICD-10-CM | POA: Diagnosis not present

## 2022-01-19 ENCOUNTER — Telehealth: Payer: Self-pay | Admitting: Pulmonary Disease

## 2022-01-19 DIAGNOSIS — N183 Chronic kidney disease, stage 3 unspecified: Secondary | ICD-10-CM | POA: Diagnosis not present

## 2022-01-19 DIAGNOSIS — J9612 Chronic respiratory failure with hypercapnia: Secondary | ICD-10-CM | POA: Diagnosis not present

## 2022-01-19 DIAGNOSIS — J449 Chronic obstructive pulmonary disease, unspecified: Secondary | ICD-10-CM | POA: Diagnosis not present

## 2022-01-19 DIAGNOSIS — E1122 Type 2 diabetes mellitus with diabetic chronic kidney disease: Secondary | ICD-10-CM | POA: Diagnosis not present

## 2022-01-19 DIAGNOSIS — N1832 Chronic kidney disease, stage 3b: Secondary | ICD-10-CM | POA: Diagnosis not present

## 2022-01-19 DIAGNOSIS — J9611 Chronic respiratory failure with hypoxia: Secondary | ICD-10-CM | POA: Diagnosis not present

## 2022-01-19 DIAGNOSIS — J9622 Acute and chronic respiratory failure with hypercapnia: Secondary | ICD-10-CM | POA: Diagnosis not present

## 2022-01-19 DIAGNOSIS — J441 Chronic obstructive pulmonary disease with (acute) exacerbation: Secondary | ICD-10-CM | POA: Diagnosis not present

## 2022-01-19 DIAGNOSIS — Z515 Encounter for palliative care: Secondary | ICD-10-CM | POA: Diagnosis not present

## 2022-01-19 DIAGNOSIS — I5032 Chronic diastolic (congestive) heart failure: Secondary | ICD-10-CM | POA: Diagnosis not present

## 2022-01-19 DIAGNOSIS — Z9981 Dependence on supplemental oxygen: Secondary | ICD-10-CM | POA: Diagnosis not present

## 2022-01-19 DIAGNOSIS — I5030 Unspecified diastolic (congestive) heart failure: Secondary | ICD-10-CM | POA: Diagnosis not present

## 2022-01-19 DIAGNOSIS — Z7409 Other reduced mobility: Secondary | ICD-10-CM | POA: Diagnosis not present

## 2022-01-19 DIAGNOSIS — J9621 Acute and chronic respiratory failure with hypoxia: Secondary | ICD-10-CM | POA: Diagnosis not present

## 2022-01-19 DIAGNOSIS — I4891 Unspecified atrial fibrillation: Secondary | ICD-10-CM | POA: Diagnosis not present

## 2022-01-19 DIAGNOSIS — I10 Essential (primary) hypertension: Secondary | ICD-10-CM | POA: Diagnosis not present

## 2022-01-19 MED ORDER — AMOXICILLIN-POT CLAVULANATE 875-125 MG PO TABS: 1.0000 | ORAL_TABLET | Freq: Two times a day (BID) | ORAL | 0 refills | Status: DC

## 2022-01-19 MED ORDER — PREDNISONE 20 MG PO TABS: ORAL_TABLET | ORAL | 0 refills | Status: DC

## 2022-01-19 NOTE — Telephone Encounter (Signed)
Called and spoke with Daniel Barker to let her know the recs form Dr. Verlee Monte and let her know that prescriptions will be sent to preferred pharmacy. She expressed understanding. Nothing further needed at this time.

## 2022-01-19 NOTE — Telephone Encounter (Signed)
Called and spoke with Suanne Marker with [facility] who states that yesterday patient began getting symptoms of shortness of breath and productive cough with yellow sputum. No fever. Was recently in the hospital 12/28/2021. She also states patient has rhonchi in both lungs.  Pharmacy is [facility] Dr   Dr. Verlee Monte please advise

## 2022-01-19 NOTE — Telephone Encounter (Signed)
Mychart message sent. Augmentin 1 tablet twice daily for a week, refilled prednisone taper.

## 2022-01-24 DIAGNOSIS — Z9981 Dependence on supplemental oxygen: Secondary | ICD-10-CM | POA: Diagnosis not present

## 2022-01-24 DIAGNOSIS — Z7409 Other reduced mobility: Secondary | ICD-10-CM | POA: Diagnosis not present

## 2022-01-24 DIAGNOSIS — N183 Chronic kidney disease, stage 3 unspecified: Secondary | ICD-10-CM | POA: Diagnosis not present

## 2022-01-24 DIAGNOSIS — J9622 Acute and chronic respiratory failure with hypercapnia: Secondary | ICD-10-CM | POA: Diagnosis not present

## 2022-01-24 DIAGNOSIS — E1122 Type 2 diabetes mellitus with diabetic chronic kidney disease: Secondary | ICD-10-CM | POA: Diagnosis not present

## 2022-01-24 DIAGNOSIS — I4891 Unspecified atrial fibrillation: Secondary | ICD-10-CM | POA: Diagnosis not present

## 2022-01-24 DIAGNOSIS — I10 Essential (primary) hypertension: Secondary | ICD-10-CM | POA: Diagnosis not present

## 2022-01-24 DIAGNOSIS — I5032 Chronic diastolic (congestive) heart failure: Secondary | ICD-10-CM | POA: Diagnosis not present

## 2022-01-24 DIAGNOSIS — J9621 Acute and chronic respiratory failure with hypoxia: Secondary | ICD-10-CM | POA: Diagnosis not present

## 2022-01-24 DIAGNOSIS — J441 Chronic obstructive pulmonary disease with (acute) exacerbation: Secondary | ICD-10-CM | POA: Diagnosis not present

## 2022-01-25 DIAGNOSIS — I5032 Chronic diastolic (congestive) heart failure: Secondary | ICD-10-CM | POA: Diagnosis not present

## 2022-01-25 DIAGNOSIS — I4891 Unspecified atrial fibrillation: Secondary | ICD-10-CM | POA: Diagnosis not present

## 2022-01-25 DIAGNOSIS — J9621 Acute and chronic respiratory failure with hypoxia: Secondary | ICD-10-CM | POA: Diagnosis not present

## 2022-01-25 DIAGNOSIS — E1122 Type 2 diabetes mellitus with diabetic chronic kidney disease: Secondary | ICD-10-CM | POA: Diagnosis not present

## 2022-01-25 DIAGNOSIS — N183 Chronic kidney disease, stage 3 unspecified: Secondary | ICD-10-CM | POA: Diagnosis not present

## 2022-01-25 DIAGNOSIS — Z7409 Other reduced mobility: Secondary | ICD-10-CM | POA: Diagnosis not present

## 2022-01-25 DIAGNOSIS — N189 Chronic kidney disease, unspecified: Secondary | ICD-10-CM | POA: Diagnosis not present

## 2022-01-25 DIAGNOSIS — J441 Chronic obstructive pulmonary disease with (acute) exacerbation: Secondary | ICD-10-CM | POA: Diagnosis not present

## 2022-01-25 DIAGNOSIS — J9622 Acute and chronic respiratory failure with hypercapnia: Secondary | ICD-10-CM | POA: Diagnosis not present

## 2022-01-25 DIAGNOSIS — Z9981 Dependence on supplemental oxygen: Secondary | ICD-10-CM | POA: Diagnosis not present

## 2022-01-25 DIAGNOSIS — I10 Essential (primary) hypertension: Secondary | ICD-10-CM | POA: Diagnosis not present

## 2022-01-26 DIAGNOSIS — J441 Chronic obstructive pulmonary disease with (acute) exacerbation: Secondary | ICD-10-CM | POA: Diagnosis not present

## 2022-01-26 DIAGNOSIS — J9622 Acute and chronic respiratory failure with hypercapnia: Secondary | ICD-10-CM | POA: Diagnosis not present

## 2022-01-26 DIAGNOSIS — J9611 Chronic respiratory failure with hypoxia: Secondary | ICD-10-CM | POA: Diagnosis not present

## 2022-01-26 DIAGNOSIS — I5030 Unspecified diastolic (congestive) heart failure: Secondary | ICD-10-CM | POA: Diagnosis not present

## 2022-01-26 DIAGNOSIS — I4891 Unspecified atrial fibrillation: Secondary | ICD-10-CM | POA: Diagnosis not present

## 2022-01-26 DIAGNOSIS — N183 Chronic kidney disease, stage 3 unspecified: Secondary | ICD-10-CM | POA: Diagnosis not present

## 2022-01-26 DIAGNOSIS — J9621 Acute and chronic respiratory failure with hypoxia: Secondary | ICD-10-CM | POA: Diagnosis not present

## 2022-01-26 DIAGNOSIS — J449 Chronic obstructive pulmonary disease, unspecified: Secondary | ICD-10-CM | POA: Diagnosis not present

## 2022-01-26 DIAGNOSIS — N1832 Chronic kidney disease, stage 3b: Secondary | ICD-10-CM | POA: Diagnosis not present

## 2022-01-26 DIAGNOSIS — Z9981 Dependence on supplemental oxygen: Secondary | ICD-10-CM | POA: Diagnosis not present

## 2022-01-26 DIAGNOSIS — E1122 Type 2 diabetes mellitus with diabetic chronic kidney disease: Secondary | ICD-10-CM | POA: Diagnosis not present

## 2022-01-26 DIAGNOSIS — Z7409 Other reduced mobility: Secondary | ICD-10-CM | POA: Diagnosis not present

## 2022-01-26 DIAGNOSIS — I5032 Chronic diastolic (congestive) heart failure: Secondary | ICD-10-CM | POA: Diagnosis not present

## 2022-01-26 DIAGNOSIS — Z515 Encounter for palliative care: Secondary | ICD-10-CM | POA: Diagnosis not present

## 2022-01-26 DIAGNOSIS — J9612 Chronic respiratory failure with hypercapnia: Secondary | ICD-10-CM | POA: Diagnosis not present

## 2022-01-26 DIAGNOSIS — I10 Essential (primary) hypertension: Secondary | ICD-10-CM | POA: Diagnosis not present

## 2022-01-27 ENCOUNTER — Other Ambulatory Visit: Payer: Self-pay | Admitting: Cardiovascular Disease

## 2022-01-27 DIAGNOSIS — N183 Chronic kidney disease, stage 3 unspecified: Secondary | ICD-10-CM | POA: Diagnosis not present

## 2022-01-27 DIAGNOSIS — I4891 Unspecified atrial fibrillation: Secondary | ICD-10-CM | POA: Diagnosis not present

## 2022-01-27 DIAGNOSIS — J9622 Acute and chronic respiratory failure with hypercapnia: Secondary | ICD-10-CM | POA: Diagnosis not present

## 2022-01-27 DIAGNOSIS — Z9981 Dependence on supplemental oxygen: Secondary | ICD-10-CM | POA: Diagnosis not present

## 2022-01-27 DIAGNOSIS — Z7409 Other reduced mobility: Secondary | ICD-10-CM | POA: Diagnosis not present

## 2022-01-27 DIAGNOSIS — J441 Chronic obstructive pulmonary disease with (acute) exacerbation: Secondary | ICD-10-CM | POA: Diagnosis not present

## 2022-01-27 DIAGNOSIS — E1122 Type 2 diabetes mellitus with diabetic chronic kidney disease: Secondary | ICD-10-CM | POA: Diagnosis not present

## 2022-01-27 DIAGNOSIS — I5032 Chronic diastolic (congestive) heart failure: Secondary | ICD-10-CM | POA: Diagnosis not present

## 2022-01-27 DIAGNOSIS — I10 Essential (primary) hypertension: Secondary | ICD-10-CM | POA: Diagnosis not present

## 2022-01-27 DIAGNOSIS — J9621 Acute and chronic respiratory failure with hypoxia: Secondary | ICD-10-CM | POA: Diagnosis not present

## 2022-01-28 DIAGNOSIS — N183 Chronic kidney disease, stage 3 unspecified: Secondary | ICD-10-CM | POA: Diagnosis not present

## 2022-01-28 DIAGNOSIS — I4891 Unspecified atrial fibrillation: Secondary | ICD-10-CM | POA: Diagnosis not present

## 2022-01-28 DIAGNOSIS — I5032 Chronic diastolic (congestive) heart failure: Secondary | ICD-10-CM | POA: Diagnosis not present

## 2022-01-28 DIAGNOSIS — J441 Chronic obstructive pulmonary disease with (acute) exacerbation: Secondary | ICD-10-CM | POA: Diagnosis not present

## 2022-01-28 DIAGNOSIS — I10 Essential (primary) hypertension: Secondary | ICD-10-CM | POA: Diagnosis not present

## 2022-01-28 DIAGNOSIS — E1122 Type 2 diabetes mellitus with diabetic chronic kidney disease: Secondary | ICD-10-CM | POA: Diagnosis not present

## 2022-01-28 DIAGNOSIS — J9622 Acute and chronic respiratory failure with hypercapnia: Secondary | ICD-10-CM | POA: Diagnosis not present

## 2022-01-28 DIAGNOSIS — J9621 Acute and chronic respiratory failure with hypoxia: Secondary | ICD-10-CM | POA: Diagnosis not present

## 2022-01-28 DIAGNOSIS — Z9981 Dependence on supplemental oxygen: Secondary | ICD-10-CM | POA: Diagnosis not present

## 2022-01-28 DIAGNOSIS — Z7409 Other reduced mobility: Secondary | ICD-10-CM | POA: Diagnosis not present

## 2022-01-31 DIAGNOSIS — J449 Chronic obstructive pulmonary disease, unspecified: Secondary | ICD-10-CM | POA: Diagnosis not present

## 2022-01-31 DIAGNOSIS — I2510 Atherosclerotic heart disease of native coronary artery without angina pectoris: Secondary | ICD-10-CM | POA: Diagnosis not present

## 2022-01-31 DIAGNOSIS — N184 Chronic kidney disease, stage 4 (severe): Secondary | ICD-10-CM | POA: Diagnosis not present

## 2022-01-31 DIAGNOSIS — N390 Urinary tract infection, site not specified: Secondary | ICD-10-CM | POA: Diagnosis not present

## 2022-01-31 DIAGNOSIS — I129 Hypertensive chronic kidney disease with stage 1 through stage 4 chronic kidney disease, or unspecified chronic kidney disease: Secondary | ICD-10-CM | POA: Diagnosis not present

## 2022-02-02 DIAGNOSIS — Z7409 Other reduced mobility: Secondary | ICD-10-CM | POA: Diagnosis not present

## 2022-02-02 DIAGNOSIS — N183 Chronic kidney disease, stage 3 unspecified: Secondary | ICD-10-CM | POA: Diagnosis not present

## 2022-02-02 DIAGNOSIS — J9621 Acute and chronic respiratory failure with hypoxia: Secondary | ICD-10-CM | POA: Diagnosis not present

## 2022-02-02 DIAGNOSIS — Z9981 Dependence on supplemental oxygen: Secondary | ICD-10-CM | POA: Diagnosis not present

## 2022-02-02 DIAGNOSIS — J9622 Acute and chronic respiratory failure with hypercapnia: Secondary | ICD-10-CM | POA: Diagnosis not present

## 2022-02-02 DIAGNOSIS — I4891 Unspecified atrial fibrillation: Secondary | ICD-10-CM | POA: Diagnosis not present

## 2022-02-02 DIAGNOSIS — E1122 Type 2 diabetes mellitus with diabetic chronic kidney disease: Secondary | ICD-10-CM | POA: Diagnosis not present

## 2022-02-02 DIAGNOSIS — J441 Chronic obstructive pulmonary disease with (acute) exacerbation: Secondary | ICD-10-CM | POA: Diagnosis not present

## 2022-02-02 DIAGNOSIS — I10 Essential (primary) hypertension: Secondary | ICD-10-CM | POA: Diagnosis not present

## 2022-02-02 DIAGNOSIS — I5032 Chronic diastolic (congestive) heart failure: Secondary | ICD-10-CM | POA: Diagnosis not present

## 2022-02-03 DIAGNOSIS — Z9981 Dependence on supplemental oxygen: Secondary | ICD-10-CM | POA: Diagnosis not present

## 2022-02-03 DIAGNOSIS — J9622 Acute and chronic respiratory failure with hypercapnia: Secondary | ICD-10-CM | POA: Diagnosis not present

## 2022-02-03 DIAGNOSIS — Z7409 Other reduced mobility: Secondary | ICD-10-CM | POA: Diagnosis not present

## 2022-02-03 DIAGNOSIS — I10 Essential (primary) hypertension: Secondary | ICD-10-CM | POA: Diagnosis not present

## 2022-02-03 DIAGNOSIS — I4891 Unspecified atrial fibrillation: Secondary | ICD-10-CM | POA: Diagnosis not present

## 2022-02-03 DIAGNOSIS — E1122 Type 2 diabetes mellitus with diabetic chronic kidney disease: Secondary | ICD-10-CM | POA: Diagnosis not present

## 2022-02-03 DIAGNOSIS — N183 Chronic kidney disease, stage 3 unspecified: Secondary | ICD-10-CM | POA: Diagnosis not present

## 2022-02-03 DIAGNOSIS — J441 Chronic obstructive pulmonary disease with (acute) exacerbation: Secondary | ICD-10-CM | POA: Diagnosis not present

## 2022-02-03 DIAGNOSIS — I5032 Chronic diastolic (congestive) heart failure: Secondary | ICD-10-CM | POA: Diagnosis not present

## 2022-02-03 DIAGNOSIS — J9621 Acute and chronic respiratory failure with hypoxia: Secondary | ICD-10-CM | POA: Diagnosis not present

## 2022-02-09 DIAGNOSIS — J9621 Acute and chronic respiratory failure with hypoxia: Secondary | ICD-10-CM | POA: Diagnosis not present

## 2022-02-09 DIAGNOSIS — Z7409 Other reduced mobility: Secondary | ICD-10-CM | POA: Diagnosis not present

## 2022-02-09 DIAGNOSIS — I5032 Chronic diastolic (congestive) heart failure: Secondary | ICD-10-CM | POA: Diagnosis not present

## 2022-02-09 DIAGNOSIS — E1122 Type 2 diabetes mellitus with diabetic chronic kidney disease: Secondary | ICD-10-CM | POA: Diagnosis not present

## 2022-02-09 DIAGNOSIS — J9622 Acute and chronic respiratory failure with hypercapnia: Secondary | ICD-10-CM | POA: Diagnosis not present

## 2022-02-09 DIAGNOSIS — I10 Essential (primary) hypertension: Secondary | ICD-10-CM | POA: Diagnosis not present

## 2022-02-09 DIAGNOSIS — J441 Chronic obstructive pulmonary disease with (acute) exacerbation: Secondary | ICD-10-CM | POA: Diagnosis not present

## 2022-02-09 DIAGNOSIS — N183 Chronic kidney disease, stage 3 unspecified: Secondary | ICD-10-CM | POA: Diagnosis not present

## 2022-02-09 DIAGNOSIS — I4891 Unspecified atrial fibrillation: Secondary | ICD-10-CM | POA: Diagnosis not present

## 2022-02-09 DIAGNOSIS — Z9981 Dependence on supplemental oxygen: Secondary | ICD-10-CM | POA: Diagnosis not present

## 2022-02-11 DIAGNOSIS — I4891 Unspecified atrial fibrillation: Secondary | ICD-10-CM | POA: Diagnosis not present

## 2022-02-11 DIAGNOSIS — J441 Chronic obstructive pulmonary disease with (acute) exacerbation: Secondary | ICD-10-CM | POA: Diagnosis not present

## 2022-02-11 DIAGNOSIS — N183 Chronic kidney disease, stage 3 unspecified: Secondary | ICD-10-CM | POA: Diagnosis not present

## 2022-02-11 DIAGNOSIS — I5032 Chronic diastolic (congestive) heart failure: Secondary | ICD-10-CM | POA: Diagnosis not present

## 2022-02-11 DIAGNOSIS — Z9981 Dependence on supplemental oxygen: Secondary | ICD-10-CM | POA: Diagnosis not present

## 2022-02-11 DIAGNOSIS — J9621 Acute and chronic respiratory failure with hypoxia: Secondary | ICD-10-CM | POA: Diagnosis not present

## 2022-02-11 DIAGNOSIS — I10 Essential (primary) hypertension: Secondary | ICD-10-CM | POA: Diagnosis not present

## 2022-02-11 DIAGNOSIS — J9622 Acute and chronic respiratory failure with hypercapnia: Secondary | ICD-10-CM | POA: Diagnosis not present

## 2022-02-11 DIAGNOSIS — E1122 Type 2 diabetes mellitus with diabetic chronic kidney disease: Secondary | ICD-10-CM | POA: Diagnosis not present

## 2022-02-11 DIAGNOSIS — Z7409 Other reduced mobility: Secondary | ICD-10-CM | POA: Diagnosis not present

## 2022-02-15 ENCOUNTER — Encounter (HOSPITAL_COMMUNITY): Payer: Self-pay

## 2022-02-15 ENCOUNTER — Other Ambulatory Visit: Payer: Self-pay

## 2022-02-15 ENCOUNTER — Emergency Department (HOSPITAL_COMMUNITY): Payer: Medicare Other

## 2022-02-15 ENCOUNTER — Inpatient Hospital Stay (HOSPITAL_COMMUNITY)
Admission: EM | Admit: 2022-02-15 | Discharge: 2022-02-23 | DRG: 190 | Disposition: A | Discharge status: Hospice | Payer: Medicare Other | Attending: Family Medicine | Admitting: Family Medicine

## 2022-02-15 DIAGNOSIS — Z888 Allergy status to other drugs, medicaments and biological substances status: Secondary | ICD-10-CM

## 2022-02-15 DIAGNOSIS — M19072 Primary osteoarthritis, left ankle and foot: Secondary | ICD-10-CM | POA: Diagnosis not present

## 2022-02-15 DIAGNOSIS — Z515 Encounter for palliative care: Secondary | ICD-10-CM | POA: Diagnosis not present

## 2022-02-15 DIAGNOSIS — N400 Enlarged prostate without lower urinary tract symptoms: Secondary | ICD-10-CM | POA: Diagnosis present

## 2022-02-15 DIAGNOSIS — Z87891 Personal history of nicotine dependence: Secondary | ICD-10-CM

## 2022-02-15 DIAGNOSIS — E782 Mixed hyperlipidemia: Secondary | ICD-10-CM | POA: Diagnosis present

## 2022-02-15 DIAGNOSIS — I482 Chronic atrial fibrillation, unspecified: Secondary | ICD-10-CM | POA: Diagnosis present

## 2022-02-15 DIAGNOSIS — I2510 Atherosclerotic heart disease of native coronary artery without angina pectoris: Secondary | ICD-10-CM | POA: Diagnosis present

## 2022-02-15 DIAGNOSIS — D631 Anemia in chronic kidney disease: Secondary | ICD-10-CM | POA: Diagnosis not present

## 2022-02-15 DIAGNOSIS — Z7951 Long term (current) use of inhaled steroids: Secondary | ICD-10-CM

## 2022-02-15 DIAGNOSIS — E1122 Type 2 diabetes mellitus with diabetic chronic kidney disease: Secondary | ICD-10-CM | POA: Diagnosis not present

## 2022-02-15 DIAGNOSIS — R0603 Acute respiratory distress: Secondary | ICD-10-CM | POA: Diagnosis not present

## 2022-02-15 DIAGNOSIS — Z7901 Long term (current) use of anticoagulants: Secondary | ICD-10-CM

## 2022-02-15 DIAGNOSIS — J9611 Chronic respiratory failure with hypoxia: Secondary | ICD-10-CM | POA: Diagnosis present

## 2022-02-15 DIAGNOSIS — E119 Type 2 diabetes mellitus without complications: Secondary | ICD-10-CM

## 2022-02-15 DIAGNOSIS — Z7189 Other specified counseling: Secondary | ICD-10-CM | POA: Diagnosis not present

## 2022-02-15 DIAGNOSIS — R918 Other nonspecific abnormal finding of lung field: Secondary | ICD-10-CM | POA: Diagnosis not present

## 2022-02-15 DIAGNOSIS — E1129 Type 2 diabetes mellitus with other diabetic kidney complication: Secondary | ICD-10-CM | POA: Diagnosis not present

## 2022-02-15 DIAGNOSIS — J9621 Acute and chronic respiratory failure with hypoxia: Secondary | ICD-10-CM | POA: Diagnosis present

## 2022-02-15 DIAGNOSIS — J189 Pneumonia, unspecified organism: Secondary | ICD-10-CM | POA: Diagnosis present

## 2022-02-15 DIAGNOSIS — E873 Alkalosis: Secondary | ICD-10-CM | POA: Diagnosis not present

## 2022-02-15 DIAGNOSIS — N179 Acute kidney failure, unspecified: Secondary | ICD-10-CM | POA: Diagnosis not present

## 2022-02-15 DIAGNOSIS — M7732 Calcaneal spur, left foot: Secondary | ICD-10-CM | POA: Diagnosis not present

## 2022-02-15 DIAGNOSIS — Z9981 Dependence on supplemental oxygen: Secondary | ICD-10-CM

## 2022-02-15 DIAGNOSIS — I700 Atherosclerosis of aorta: Secondary | ICD-10-CM | POA: Diagnosis not present

## 2022-02-15 DIAGNOSIS — I480 Paroxysmal atrial fibrillation: Secondary | ICD-10-CM | POA: Diagnosis not present

## 2022-02-15 DIAGNOSIS — Z825 Family history of asthma and other chronic lower respiratory diseases: Secondary | ICD-10-CM

## 2022-02-15 DIAGNOSIS — D539 Nutritional anemia, unspecified: Secondary | ICD-10-CM | POA: Diagnosis present

## 2022-02-15 DIAGNOSIS — T380X5A Adverse effect of glucocorticoids and synthetic analogues, initial encounter: Secondary | ICD-10-CM | POA: Diagnosis not present

## 2022-02-15 DIAGNOSIS — L97509 Non-pressure chronic ulcer of other part of unspecified foot with unspecified severity: Secondary | ICD-10-CM

## 2022-02-15 DIAGNOSIS — Z955 Presence of coronary angioplasty implant and graft: Secondary | ICD-10-CM

## 2022-02-15 DIAGNOSIS — J439 Emphysema, unspecified: Secondary | ICD-10-CM | POA: Diagnosis not present

## 2022-02-15 DIAGNOSIS — Z743 Need for continuous supervision: Secondary | ICD-10-CM | POA: Diagnosis not present

## 2022-02-15 DIAGNOSIS — J9622 Acute and chronic respiratory failure with hypercapnia: Secondary | ICD-10-CM | POA: Diagnosis not present

## 2022-02-15 DIAGNOSIS — Z7952 Long term (current) use of systemic steroids: Secondary | ICD-10-CM

## 2022-02-15 DIAGNOSIS — D72829 Elevated white blood cell count, unspecified: Secondary | ICD-10-CM | POA: Diagnosis not present

## 2022-02-15 DIAGNOSIS — I5032 Chronic diastolic (congestive) heart failure: Secondary | ICD-10-CM | POA: Diagnosis present

## 2022-02-15 DIAGNOSIS — E876 Hypokalemia: Secondary | ICD-10-CM | POA: Diagnosis present

## 2022-02-15 DIAGNOSIS — R0602 Shortness of breath: Secondary | ICD-10-CM | POA: Diagnosis not present

## 2022-02-15 DIAGNOSIS — I743 Embolism and thrombosis of arteries of the lower extremities: Secondary | ICD-10-CM | POA: Diagnosis not present

## 2022-02-15 DIAGNOSIS — Z8249 Family history of ischemic heart disease and other diseases of the circulatory system: Secondary | ICD-10-CM

## 2022-02-15 DIAGNOSIS — I130 Hypertensive heart and chronic kidney disease with heart failure and stage 1 through stage 4 chronic kidney disease, or unspecified chronic kidney disease: Secondary | ICD-10-CM | POA: Diagnosis present

## 2022-02-15 DIAGNOSIS — N1831 Chronic kidney disease, stage 3a: Secondary | ICD-10-CM | POA: Diagnosis present

## 2022-02-15 DIAGNOSIS — Z7902 Long term (current) use of antithrombotics/antiplatelets: Secondary | ICD-10-CM

## 2022-02-15 DIAGNOSIS — R6889 Other general symptoms and signs: Secondary | ICD-10-CM | POA: Diagnosis not present

## 2022-02-15 DIAGNOSIS — Z951 Presence of aortocoronary bypass graft: Secondary | ICD-10-CM

## 2022-02-15 DIAGNOSIS — J441 Chronic obstructive pulmonary disease with (acute) exacerbation: Secondary | ICD-10-CM | POA: Diagnosis not present

## 2022-02-15 DIAGNOSIS — E11621 Type 2 diabetes mellitus with foot ulcer: Secondary | ICD-10-CM | POA: Diagnosis present

## 2022-02-15 DIAGNOSIS — D696 Thrombocytopenia, unspecified: Secondary | ICD-10-CM | POA: Diagnosis present

## 2022-02-15 DIAGNOSIS — N189 Chronic kidney disease, unspecified: Secondary | ICD-10-CM | POA: Diagnosis not present

## 2022-02-15 DIAGNOSIS — Z20822 Contact with and (suspected) exposure to covid-19: Secondary | ICD-10-CM | POA: Diagnosis present

## 2022-02-15 DIAGNOSIS — Z79899 Other long term (current) drug therapy: Secondary | ICD-10-CM

## 2022-02-15 DIAGNOSIS — L97529 Non-pressure chronic ulcer of other part of left foot with unspecified severity: Secondary | ICD-10-CM | POA: Diagnosis not present

## 2022-02-15 DIAGNOSIS — E1165 Type 2 diabetes mellitus with hyperglycemia: Secondary | ICD-10-CM | POA: Diagnosis not present

## 2022-02-15 DIAGNOSIS — Z872 Personal history of diseases of the skin and subcutaneous tissue: Secondary | ICD-10-CM | POA: Diagnosis not present

## 2022-02-15 DIAGNOSIS — R0902 Hypoxemia: Secondary | ICD-10-CM | POA: Diagnosis not present

## 2022-02-15 LAB — CBC WITH DIFFERENTIAL/PLATELET
Abs Immature Granulocytes: 0.2 10*3/uL — ABNORMAL HIGH (ref 0.00–0.07)
Basophils Absolute: 0 10*3/uL (ref 0.0–0.1)
Basophils Relative: 0 %
Eosinophils Absolute: 0 10*3/uL (ref 0.0–0.5)
Eosinophils Relative: 0 %
HCT: 30.6 % — ABNORMAL LOW (ref 39.0–52.0)
Hemoglobin: 9.5 g/dL — ABNORMAL LOW (ref 13.0–17.0)
Immature Granulocytes: 2 %
Lymphocytes Relative: 4 %
Lymphs Abs: 0.5 10*3/uL — ABNORMAL LOW (ref 0.7–4.0)
MCH: 32.8 pg (ref 26.0–34.0)
MCHC: 31 g/dL (ref 30.0–36.0)
MCV: 105.5 fL — ABNORMAL HIGH (ref 80.0–100.0)
Monocytes Absolute: 0.3 10*3/uL (ref 0.1–1.0)
Monocytes Relative: 3 %
Neutro Abs: 11.3 10*3/uL — ABNORMAL HIGH (ref 1.7–7.7)
Neutrophils Relative %: 91 %
Platelets: 120 10*3/uL — ABNORMAL LOW (ref 150–400)
RBC: 2.9 MIL/uL — ABNORMAL LOW (ref 4.22–5.81)
RDW: 16.9 % — ABNORMAL HIGH (ref 11.5–15.5)
WBC: 12.4 10*3/uL — ABNORMAL HIGH (ref 4.0–10.5)
nRBC: 0 % (ref 0.0–0.2)

## 2022-02-15 LAB — RESP PANEL BY RT-PCR (FLU A&B, COVID) ARPGX2
Influenza A by PCR: NEGATIVE
Influenza B by PCR: NEGATIVE
SARS Coronavirus 2 by RT PCR: NEGATIVE

## 2022-02-15 LAB — BASIC METABOLIC PANEL
Anion gap: 11 (ref 5–15)
BUN: 38 mg/dL — ABNORMAL HIGH (ref 8–23)
CO2: 37 mmol/L — ABNORMAL HIGH (ref 22–32)
Calcium: 9.1 mg/dL (ref 8.9–10.3)
Chloride: 94 mmol/L — ABNORMAL LOW (ref 98–111)
Creatinine, Ser: 1.96 mg/dL — ABNORMAL HIGH (ref 0.61–1.24)
GFR, Estimated: 35 mL/min — ABNORMAL LOW (ref >60)
Glucose, Bld: 165 mg/dL — ABNORMAL HIGH (ref 70–99)
Potassium: 3.8 mmol/L (ref 3.5–5.1)
Sodium: 142 mmol/L (ref 135–145)

## 2022-02-15 LAB — BRAIN NATRIURETIC PEPTIDE: B Natriuretic Peptide: 409 pg/mL — ABNORMAL HIGH (ref 0.0–100.0)

## 2022-02-15 MED ORDER — TORSEMIDE 20 MG PO TABS
10.0000 mg | ORAL_TABLET | Freq: Every day | ORAL | Status: DC
  Administered 2022-02-16 – 2022-02-22 (×8): 10 mg via ORAL
  Filled 2022-02-15 (×8): qty 1

## 2022-02-15 MED ORDER — ONDANSETRON HCL 4 MG/2ML IJ SOLN: 4.0000 mg | Freq: Four times a day (QID) | INTRAMUSCULAR | Status: DC | PRN

## 2022-02-15 MED ORDER — MORPHINE SULFATE (PF) 2 MG/ML IV SOLN: 2.0000 mg | INTRAVENOUS | Status: DC | PRN

## 2022-02-15 MED ORDER — IPRATROPIUM BROMIDE 0.02 % IN SOLN
0.5000 mg | Freq: Once | RESPIRATORY_TRACT | Status: AC
  Administered 2022-02-15: 0.5 mg via RESPIRATORY_TRACT
  Filled 2022-02-15: qty 2.5

## 2022-02-15 MED ORDER — FUROSEMIDE 10 MG/ML IJ SOLN
60.0000 mg | Freq: Once | INTRAMUSCULAR | Status: AC
  Administered 2022-02-15: 60 mg via INTRAVENOUS
  Filled 2022-02-15: qty 6

## 2022-02-15 MED ORDER — ALBUTEROL SULFATE (2.5 MG/3ML) 0.083% IN NEBU
2.5000 mg | INHALATION_SOLUTION | RESPIRATORY_TRACT | Status: DC | PRN
  Administered 2022-02-21 – 2022-02-23 (×3): 2.5 mg via RESPIRATORY_TRACT
  Filled 2022-02-15 (×3): qty 3

## 2022-02-15 MED ORDER — FENOFIBRATE 54 MG PO TABS
54.0000 mg | ORAL_TABLET | Freq: Every day | ORAL | Status: DC
  Administered 2022-02-16 – 2022-02-23 (×7): 54 mg via ORAL
  Filled 2022-02-15 (×10): qty 1

## 2022-02-15 MED ORDER — BISOPROLOL FUMARATE 5 MG PO TABS
5.0000 mg | ORAL_TABLET | Freq: Two times a day (BID) | ORAL | Status: DC
  Administered 2022-02-16 – 2022-02-23 (×16): 5 mg via ORAL
  Filled 2022-02-15 (×16): qty 1

## 2022-02-15 MED ORDER — PROPAFENONE HCL 225 MG PO TABS
225.0000 mg | ORAL_TABLET | Freq: Three times a day (TID) | ORAL | Status: DC
  Administered 2022-02-16 – 2022-02-23 (×23): 225 mg via ORAL
  Filled 2022-02-15 (×30): qty 1

## 2022-02-15 MED ORDER — PREDNISONE 10 MG PO TABS: 50.0000 mg | ORAL_TABLET | Freq: Every day | ORAL | 0 refills | Status: DC

## 2022-02-15 MED ORDER — HEPARIN SODIUM (PORCINE) 5000 UNIT/ML IJ SOLN
5000.0000 [IU] | Freq: Three times a day (TID) | INTRAMUSCULAR | Status: DC
  Administered 2022-02-16 – 2022-02-23 (×21): 5000 [IU] via SUBCUTANEOUS
  Filled 2022-02-15 (×21): qty 1

## 2022-02-15 MED ORDER — MONTELUKAST SODIUM 10 MG PO TABS
10.0000 mg | ORAL_TABLET | Freq: Every day | ORAL | Status: DC
  Administered 2022-02-16 – 2022-02-23 (×8): 10 mg via ORAL
  Filled 2022-02-15 (×8): qty 1

## 2022-02-15 MED ORDER — ONDANSETRON HCL 4 MG PO TABS: 4.0000 mg | ORAL_TABLET | Freq: Four times a day (QID) | ORAL | Status: DC | PRN

## 2022-02-15 MED ORDER — IPRATROPIUM-ALBUTEROL 0.5-2.5 (3) MG/3ML IN SOLN
3.0000 mL | Freq: Four times a day (QID) | RESPIRATORY_TRACT | Status: DC
  Administered 2022-02-16 – 2022-02-18 (×12): 3 mL via RESPIRATORY_TRACT
  Filled 2022-02-15 (×11): qty 3

## 2022-02-15 MED ORDER — ACETAMINOPHEN 325 MG PO TABS: 650.0000 mg | ORAL_TABLET | Freq: Four times a day (QID) | ORAL | Status: DC | PRN

## 2022-02-15 MED ORDER — TORSEMIDE 20 MG PO TABS
20.0000 mg | ORAL_TABLET | Freq: Every day | ORAL | Status: DC
  Administered 2022-02-16 – 2022-02-23 (×8): 20 mg via ORAL
  Filled 2022-02-15 (×8): qty 1

## 2022-02-15 MED ORDER — PANTOPRAZOLE SODIUM 40 MG PO TBEC
40.0000 mg | DELAYED_RELEASE_TABLET | Freq: Two times a day (BID) | ORAL | Status: DC
  Administered 2022-02-16 – 2022-02-23 (×16): 40 mg via ORAL
  Filled 2022-02-15 (×16): qty 1

## 2022-02-15 MED ORDER — CLOPIDOGREL BISULFATE 75 MG PO TABS
75.0000 mg | ORAL_TABLET | Freq: Every day | ORAL | Status: DC
  Administered 2022-02-16 – 2022-02-23 (×8): 75 mg via ORAL
  Filled 2022-02-15 (×8): qty 1

## 2022-02-15 MED ORDER — METHYLPREDNISOLONE SODIUM SUCC 125 MG IJ SOLR
125.0000 mg | INTRAMUSCULAR | Status: AC
  Administered 2022-02-16: 125 mg via INTRAVENOUS
  Filled 2022-02-15: qty 2

## 2022-02-15 MED ORDER — OXYCODONE HCL 5 MG PO TABS
5.0000 mg | ORAL_TABLET | ORAL | Status: DC | PRN
  Filled 2022-02-15: qty 1

## 2022-02-15 MED ORDER — ORAL CARE MOUTH RINSE
15.0000 mL | Freq: Two times a day (BID) | OROMUCOSAL | Status: DC
  Administered 2022-02-16 – 2022-02-23 (×11): 15 mL via OROMUCOSAL

## 2022-02-15 MED ORDER — AMLODIPINE BESYLATE 5 MG PO TABS
5.0000 mg | ORAL_TABLET | Freq: Every day | ORAL | Status: DC
  Administered 2022-02-16 – 2022-02-23 (×8): 5 mg via ORAL
  Filled 2022-02-15 (×8): qty 1

## 2022-02-15 MED ORDER — DOXYCYCLINE HYCLATE 100 MG PO CAPS: 100.0000 mg | ORAL_CAPSULE | Freq: Two times a day (BID) | ORAL | 0 refills | Status: DC

## 2022-02-15 MED ORDER — DOXYCYCLINE HYCLATE 100 MG PO TABS
100.0000 mg | ORAL_TABLET | Freq: Once | ORAL | Status: AC
Start: 2022-02-15 — End: 2022-02-15
  Administered 2022-02-15: 100 mg via ORAL
  Filled 2022-02-15: qty 1

## 2022-02-15 MED ORDER — PREDNISONE 20 MG PO TABS
40.0000 mg | ORAL_TABLET | Freq: Every day | ORAL | Status: DC
  Administered 2022-02-16: 40 mg via ORAL
  Filled 2022-02-15: qty 2

## 2022-02-15 MED ORDER — SODIUM CHLORIDE 0.9 % IV SOLN
500.0000 mg | INTRAVENOUS | Status: DC
  Administered 2022-02-15 – 2022-02-18 (×4): 500 mg via INTRAVENOUS
  Filled 2022-02-15 (×4): qty 5

## 2022-02-15 MED ORDER — ALBUTEROL SULFATE (2.5 MG/3ML) 0.083% IN NEBU
INHALATION_SOLUTION | RESPIRATORY_TRACT | Status: AC
  Administered 2022-02-15: 10 mg
  Filled 2022-02-15: qty 12

## 2022-02-15 MED ORDER — ALBUTEROL (5 MG/ML) CONTINUOUS INHALATION SOLN
10.0000 mg/h | INHALATION_SOLUTION | Freq: Once | RESPIRATORY_TRACT | Status: DC
  Filled 2022-02-15: qty 20

## 2022-02-15 MED ORDER — ACETAMINOPHEN 650 MG RE SUPP: 650.0000 mg | Freq: Four times a day (QID) | RECTAL | Status: DC | PRN

## 2022-02-15 MED ORDER — MOMETASONE FURO-FORMOTEROL FUM 200-5 MCG/ACT IN AERO
2.0000 | INHALATION_SPRAY | Freq: Two times a day (BID) | RESPIRATORY_TRACT | Status: DC
Start: 2022-02-16 — End: 2022-02-17
  Administered 2022-02-16 – 2022-02-17 (×3): 2 via RESPIRATORY_TRACT
  Filled 2022-02-15: qty 8.8

## 2022-02-15 NOTE — ED Triage Notes (Signed)
"  He was on his portable O2 at 2L via [place], began having shortness breath this morning and getting worse upon exertion. Sat was 65% after walking on our arrival. Gave 15 liters O2 via [place], 125 solumedrol, one duoneb, and O2 came up to 98$, ETCO2 30-35" per EMS ?

## 2022-02-15 NOTE — ED Provider Notes (Signed)
?[facility] ?Provider Note ? ? ?CSN: [id number] ?Arrival date & time: 02/15/22  1346 ? ?  ? ?History ? ?Chief Complaint  ?Patient presents with  ? Respiratory Distress  ? ? ?Daniel Barker is a 76 y.o. male. ? ?HPI ? ?  ? ?76 year old male comes in with chief complaint of respiratory distress. 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  He was admitted to the hospital last month for respiratory failure. ? ?Patient indicates that he was doing well last night and yesterday, but woke up this morning with some shortness of breath.  He has been using his 2 L of oxygen, without significant improvement.  He finally called EMS.  EMS indicated that patient's O2 sats were in the mid 60s when he was being walked, he was started on 15 L of oxygen and given DuoNeb along with Solu-Medrol.  His oxygen saturation improved, patient is now feeling little better. ? ?He denies any chest pain, fevers, chills, weight gain, orthopnea, PND.  He is taking his medications as prescribed. ? ?Home Medications ?Prior to Admission medications   ?Medication Sig Start Date End Date Taking? Authorizing Provider  ?albuterol (PROVENTIL) (2.5 MG/3ML) 0.083% nebulizer solution Take 3 mLs (2.5 mg total) by nebulization every 6 (six) hours as needed for wheezing or shortness of breath. DX: J44.9. 01/01/22  Yes Johnson, Clanford L, MD  ?albuterol (VENTOLIN HFA) 108 (90 Base) MCG/ACT inhaler Inhale 2 puffs into the lungs every 4 (four) hours as needed for wheezing or shortness of breath. 01/01/22  Yes Johnson, Clanford L, MD  ?amLODipine (NORVASC) 5 MG tablet Take 1 tablet (5 mg total) by mouth daily. 10/30/21  Yes Emokpae, Courage, MD  ?bisoprolol (ZEBETA) 5 MG tablet TAKE 1 TABLET(5 MG) BY MOUTH TWICE DAILY ?Patient taking differently:  Take 5 mg by mouth 2 (two) times daily. 01/05/22  Yes Tanda Rockers, MD  ?budesonide-formoterol (SYMBICORT) 160-4.5 MCG/ACT inhaler USE 2 PUFFS BY MOUTH EVERY MORNING AND 2 PUFFS EVERY EVENING 11/10/21  Yes Emokpae, Courage, MD  ?clopidogrel (PLAVIX) 75 MG tablet Take 1 tablet (75 mg total) by mouth daily. 10/30/21  Yes Roxan Hockey, MD  ?doxycycline (VIBRAMYCIN) 100 MG capsule Take 1 capsule (100 mg total) by mouth 2 (two) times daily. 02/15/22  Yes Varney Biles, MD  ?fenofibrate micronized (LOFIBRA) 134 MG capsule Take 134 mg by mouth daily before breakfast.   Yes [provider]  ?fluticasone (FLONASE) 50 MCG/ACT nasal spray Place 1 spray into both nostrils daily.   Yes [provider]  ?montelukast (SINGULAIR) 10 MG tablet Take 10 mg by mouth daily.   Yes [provider]  ?pantoprazole (PROTONIX) 40 MG tablet Take 1 tablet (40 mg total) by mouth 2 (two) times daily. 10/30/21  Yes Emokpae, Courage, MD  ?polyethylene glycol powder (GLYCOLAX/MIRALAX) 17 GM/SCOOP powder Take 1 Container by mouth in the morning and at bedtime.   Yes [provider]  ?predniSONE (DELTASONE) 10 MG tablet Take 5 tablets (50 mg total) by mouth daily. 02/15/22  Yes Sommer Spickard, MD  ?propafenone (RYTHMOL) 225 MG tablet TAKE 1 TABLET(225 MG) BY MOUTH EVERY 8 HOURS ?Patient taking differently: Take 225 mg by mouth every 8 (eight) hours. 01/27/22  Yes Josue Hector, MD  ?tamsulosin (FLOMAX) 0.4 MG CAPS capsule Take 1 capsule (0.4 mg total) by mouth daily  after supper. 10/30/21  Yes Emokpae, Courage, MD  ?Tiotropium Bromide Monohydrate (SPIRIVA RESPIMAT) 2.5 MCG/ACT AERS Inhale 2 puffs into the lungs daily. 10/30/21  Yes Roxan Hockey, MD  ?torsemide (DEMADEX) 20 MG tablet Take 1 tablet (20 mg total) by mouth See admin instructions. Take 1 Tablet 20 mg qam and half tab (10 mg) every evening 10/30/21 10/30/22 Yes Emokpae, Courage, MD  ?zinc sulfate 220 (50 Zn) MG capsule Take 1 capsule (220 mg total) by  mouth daily. ?Patient taking differently: Take 50 mg by mouth daily. 05/24/21  Yes Barton Dubois, MD  ?acetaminophen (TYLENOL) 500 MG tablet Take 1,000 mg by mouth every 6 (six) hours as needed for mild pain.    [provider]  ?amoxicillin-clavulanate (AUGMENTIN) 875-125 MG tablet Take 1 tablet by mouth 2 (two) times daily. ?Patient not taking: Reported on 02/15/2022 01/19/22   Maryjane Hurter, MD  ?Nebulizers (COMPRESSOR/NEBULIZER) MISC Inhale 1 Units into the lungs as needed. 10/30/21   Roxan Hockey, MD  ?potassium chloride SA (KLOR-CON M) 20 MEQ tablet Take 1 tablet (20 mEq total) by mouth daily. Take will taking Torsemide/Demadex Fluid Medicine ?Patient not taking: Reported on 02/15/2022 10/30/21   Roxan Hockey, MD  ?   ? ?Allergies    ?Lipitor [atorvastatin]   ? ?Review of Systems   ?Review of Systems  ?All other systems reviewed and are negative. ? ?Physical Exam ?Updated Vital Signs ?BP 99/62   Pulse 86   Temp 98 ?F (36.7 ?C) (Oral)   Resp 19   Ht 5' 6.5" (1.689 m)   Wt 73 kg   SpO2 96%   BMI 25.60 kg/m?  ?Physical Exam ?Vitals and nursing note reviewed.  ?Constitutional:   ?   Appearance: He is well-developed.  ?HENT:  ?   Head: Atraumatic.  ?Cardiovascular:  ?   Rate and Rhythm: Normal rate.  ?Pulmonary:  ?   Effort: Pulmonary effort is normal. No respiratory distress.  ?   Breath sounds: No wheezing or rhonchi.  ?   Comments: Extremely poor aeration diffusely with no wheezing or focal consolidation on exam ?Musculoskeletal:  ?   Cervical back: Neck supple.  ?   Right lower leg: Edema present.  ?   Left lower leg: Edema present.  ?   Comments: 1+ pitting edema bilateral lower extremity  ?Skin: ?   General: Skin is warm.  ?Neurological:  ?   Mental Status: He is alert and oriented to person, place, and time.  ? ? ?ED Results / Procedures / Treatments   ?Labs ?(all labs ordered are listed, but only abnormal results are displayed) ?Labs Reviewed  ?CBC WITH DIFFERENTIAL/PLATELET -  Abnormal; Notable for the following components:  ?    Result Value  ? WBC 12.4 (*)   ? RBC 2.90 (*)   ? Hemoglobin 9.5 (*)   ? HCT 30.6 (*)   ? MCV 105.5 (*)   ? RDW 16.9 (*)   ? Platelets 120 (*)   ? Neutro Abs 11.3 (*)   ? Lymphs Abs 0.5 (*)   ? Abs Immature Granulocytes 0.20 (*)   ? All other components within normal limits  ?BASIC METABOLIC PANEL - Abnormal; Notable for the following components:  ? Chloride 94 (*)   ? CO2 37 (*)   ? Glucose, Bld 165 (*)   ? BUN 38 (*)   ? Creatinine, Ser 1.96 (*)   ? GFR, Estimated 35 (*)   ? All other components within normal limits  ?  BRAIN NATRIURETIC PEPTIDE - Abnormal; Notable for the following components:  ? B Natriuretic Peptide 409.0 (*)   ? All other components within normal limits  ?RESP PANEL BY RT-PCR (FLU A&B, COVID) ARPGX2  ? ? ?EKG ?EKG Interpretation ? ?Date/Time:  Tuesday February 15 2022 13:59:37 EDT ?Ventricular Rate:  64 ?PR Interval:    ?QRS Duration: 110 ?QT Interval:  405 ?QTC Calculation: 418 ?R Axis:   11 ?Text Interpretation: unclear rhythm due to artifact A nterior infarct, old Borderline repolarization abnormality Baseline wander in lead(s) V3 poor data quality, needs repeat Confirmed by Sherwood Gambler [phone]) on 02/15/2022 2:03:15 PM ? ?Radiology ?DG Chest Port 1 View ? ?Result Date: 02/15/2022 ?CLINICAL DATA:  Shortness of breath, worse with exertion. EXAM: PORTABLE CHEST 1 VIEW COMPARISON:  12/28/2021 FINDINGS: Artifact overlies the chest. Previous median sternotomy. Mild chronic interstitial lung markings. No sign of active infiltrate, mass, effusion or collapse. IMPRESSION: No active disease. Previous CABG. Mild chronic interstitial lung markings. Electronically Signed   By: Nelson Chimes M.D.   On: 02/15/2022 15:56   ? ?Procedures ?Marland KitchenCritical Care ?Performed by: Varney Biles, MD ?Authorized by: Varney Biles, MD  ? ?Critical care provider statement:  ?  Critical care time (minutes):  33 ?  Critical care was necessary to treat or prevent imminent  or life-threatening deterioration of the following conditions:  Respiratory failure ?  Critical care was time spent personally by me on the following activities:  Development of treatment plan with patient or sur

## 2022-02-15 NOTE — ED Notes (Signed)
Pt had 20g LAC PIV in place from previous shift.  Pt c/o burning at site with saline flush. Area is red and swollen. PIV d/c'd d/t infiltration. Bandage applied. Catheter intact on removal. ?

## 2022-02-15 NOTE — ED Notes (Addendum)
Attempted to walk patient to check 02 sats. Pt able to sit on side of bed, became more short of breath, 02 sat decreased to 89% on 2LNC. Pt unable to speak in full sentences, has pursed lip breathing, RR=24.  ?

## 2022-02-15 NOTE — Discharge Instructions (Addendum)
Take the medicine prescribed for COPD exacerbation.  It appears, that with hour-long breathing treatment and other supportive medication, your respiration has improved and you feel comfortable going home ? ?Return to the ER immediately if you start having worsening shortness of breath, fevers, chills. ? ?

## 2022-02-15 NOTE — ED Notes (Signed)
Sitting up eating dinner. Moderate shortness of breath noted when speaking sentences. ?

## 2022-02-16 ENCOUNTER — Other Ambulatory Visit: Payer: Self-pay | Admitting: Internal Medicine

## 2022-02-16 DIAGNOSIS — I130 Hypertensive heart and chronic kidney disease with heart failure and stage 1 through stage 4 chronic kidney disease, or unspecified chronic kidney disease: Secondary | ICD-10-CM | POA: Diagnosis present

## 2022-02-16 DIAGNOSIS — J439 Emphysema, unspecified: Secondary | ICD-10-CM | POA: Diagnosis not present

## 2022-02-16 DIAGNOSIS — E1129 Type 2 diabetes mellitus with other diabetic kidney complication: Secondary | ICD-10-CM

## 2022-02-16 DIAGNOSIS — R0602 Shortness of breath: Secondary | ICD-10-CM | POA: Diagnosis not present

## 2022-02-16 DIAGNOSIS — J189 Pneumonia, unspecified organism: Secondary | ICD-10-CM

## 2022-02-16 DIAGNOSIS — E873 Alkalosis: Secondary | ICD-10-CM | POA: Diagnosis not present

## 2022-02-16 DIAGNOSIS — M7732 Calcaneal spur, left foot: Secondary | ICD-10-CM | POA: Diagnosis not present

## 2022-02-16 DIAGNOSIS — E11621 Type 2 diabetes mellitus with foot ulcer: Secondary | ICD-10-CM | POA: Diagnosis present

## 2022-02-16 DIAGNOSIS — E1165 Type 2 diabetes mellitus with hyperglycemia: Secondary | ICD-10-CM | POA: Diagnosis not present

## 2022-02-16 DIAGNOSIS — J9621 Acute and chronic respiratory failure with hypoxia: Secondary | ICD-10-CM | POA: Diagnosis not present

## 2022-02-16 DIAGNOSIS — Z515 Encounter for palliative care: Secondary | ICD-10-CM | POA: Diagnosis not present

## 2022-02-16 DIAGNOSIS — J9622 Acute and chronic respiratory failure with hypercapnia: Secondary | ICD-10-CM | POA: Diagnosis not present

## 2022-02-16 DIAGNOSIS — L97529 Non-pressure chronic ulcer of other part of left foot with unspecified severity: Secondary | ICD-10-CM | POA: Diagnosis present

## 2022-02-16 DIAGNOSIS — R0603 Acute respiratory distress: Secondary | ICD-10-CM | POA: Diagnosis not present

## 2022-02-16 DIAGNOSIS — Z7901 Long term (current) use of anticoagulants: Secondary | ICD-10-CM | POA: Diagnosis not present

## 2022-02-16 DIAGNOSIS — N400 Enlarged prostate without lower urinary tract symptoms: Secondary | ICD-10-CM | POA: Diagnosis present

## 2022-02-16 DIAGNOSIS — Z7189 Other specified counseling: Secondary | ICD-10-CM | POA: Diagnosis not present

## 2022-02-16 DIAGNOSIS — I2510 Atherosclerotic heart disease of native coronary artery without angina pectoris: Secondary | ICD-10-CM | POA: Diagnosis not present

## 2022-02-16 DIAGNOSIS — J9611 Chronic respiratory failure with hypoxia: Secondary | ICD-10-CM | POA: Diagnosis present

## 2022-02-16 DIAGNOSIS — Z20822 Contact with and (suspected) exposure to covid-19: Secondary | ICD-10-CM | POA: Diagnosis present

## 2022-02-16 DIAGNOSIS — Z872 Personal history of diseases of the skin and subcutaneous tissue: Secondary | ICD-10-CM | POA: Diagnosis not present

## 2022-02-16 DIAGNOSIS — D539 Nutritional anemia, unspecified: Secondary | ICD-10-CM | POA: Diagnosis not present

## 2022-02-16 DIAGNOSIS — I480 Paroxysmal atrial fibrillation: Secondary | ICD-10-CM

## 2022-02-16 DIAGNOSIS — J441 Chronic obstructive pulmonary disease with (acute) exacerbation: Secondary | ICD-10-CM

## 2022-02-16 DIAGNOSIS — I700 Atherosclerosis of aorta: Secondary | ICD-10-CM | POA: Diagnosis not present

## 2022-02-16 DIAGNOSIS — R918 Other nonspecific abnormal finding of lung field: Secondary | ICD-10-CM | POA: Diagnosis not present

## 2022-02-16 DIAGNOSIS — N1831 Chronic kidney disease, stage 3a: Secondary | ICD-10-CM | POA: Diagnosis present

## 2022-02-16 DIAGNOSIS — E782 Mixed hyperlipidemia: Secondary | ICD-10-CM | POA: Diagnosis not present

## 2022-02-16 DIAGNOSIS — D696 Thrombocytopenia, unspecified: Secondary | ICD-10-CM | POA: Diagnosis not present

## 2022-02-16 DIAGNOSIS — N179 Acute kidney failure, unspecified: Secondary | ICD-10-CM | POA: Diagnosis not present

## 2022-02-16 DIAGNOSIS — I743 Embolism and thrombosis of arteries of the lower extremities: Secondary | ICD-10-CM | POA: Diagnosis not present

## 2022-02-16 DIAGNOSIS — Z7951 Long term (current) use of inhaled steroids: Secondary | ICD-10-CM | POA: Diagnosis not present

## 2022-02-16 DIAGNOSIS — N189 Chronic kidney disease, unspecified: Secondary | ICD-10-CM | POA: Diagnosis not present

## 2022-02-16 DIAGNOSIS — D631 Anemia in chronic kidney disease: Secondary | ICD-10-CM | POA: Diagnosis present

## 2022-02-16 DIAGNOSIS — I482 Chronic atrial fibrillation, unspecified: Secondary | ICD-10-CM | POA: Diagnosis present

## 2022-02-16 DIAGNOSIS — M19072 Primary osteoarthritis, left ankle and foot: Secondary | ICD-10-CM | POA: Diagnosis not present

## 2022-02-16 DIAGNOSIS — Z9981 Dependence on supplemental oxygen: Secondary | ICD-10-CM | POA: Diagnosis not present

## 2022-02-16 DIAGNOSIS — D72829 Elevated white blood cell count, unspecified: Secondary | ICD-10-CM | POA: Diagnosis not present

## 2022-02-16 DIAGNOSIS — Z951 Presence of aortocoronary bypass graft: Secondary | ICD-10-CM | POA: Diagnosis not present

## 2022-02-16 DIAGNOSIS — E876 Hypokalemia: Secondary | ICD-10-CM | POA: Diagnosis present

## 2022-02-16 DIAGNOSIS — E1122 Type 2 diabetes mellitus with diabetic chronic kidney disease: Secondary | ICD-10-CM | POA: Diagnosis present

## 2022-02-16 DIAGNOSIS — I5032 Chronic diastolic (congestive) heart failure: Secondary | ICD-10-CM | POA: Diagnosis not present

## 2022-02-16 LAB — CBC WITH DIFFERENTIAL/PLATELET
Abs Immature Granulocytes: 0.17 10*3/uL — ABNORMAL HIGH (ref 0.00–0.07)
Basophils Absolute: 0 10*3/uL (ref 0.0–0.1)
Basophils Relative: 0 %
Eosinophils Absolute: 0 10*3/uL (ref 0.0–0.5)
Eosinophils Relative: 0 %
HCT: 28.2 % — ABNORMAL LOW (ref 39.0–52.0)
Hemoglobin: 8.6 g/dL — ABNORMAL LOW (ref 13.0–17.0)
Immature Granulocytes: 2 %
Lymphocytes Relative: 3 %
Lymphs Abs: 0.3 10*3/uL — ABNORMAL LOW (ref 0.7–4.0)
MCH: 31.3 pg (ref 26.0–34.0)
MCHC: 30.5 g/dL (ref 30.0–36.0)
MCV: 102.5 fL — ABNORMAL HIGH (ref 80.0–100.0)
Monocytes Absolute: 0.1 10*3/uL (ref 0.1–1.0)
Monocytes Relative: 1 %
Neutro Abs: 9.8 10*3/uL — ABNORMAL HIGH (ref 1.7–7.7)
Neutrophils Relative %: 94 %
Platelets: 132 10*3/uL — ABNORMAL LOW (ref 150–400)
RBC: 2.75 MIL/uL — ABNORMAL LOW (ref 4.22–5.81)
RDW: 16.4 % — ABNORMAL HIGH (ref 11.5–15.5)
WBC: 10.3 10*3/uL (ref 4.0–10.5)
nRBC: 0 % (ref 0.0–0.2)

## 2022-02-16 LAB — BLOOD GAS, VENOUS
Acid-Base Excess: 14.2 mmol/L — ABNORMAL HIGH (ref 0.0–2.0)
Acid-Base Excess: 16.9 mmol/L — ABNORMAL HIGH (ref 0.0–2.0)
Bicarbonate: 39 mmol/L — ABNORMAL HIGH (ref 20.0–28.0)
Bicarbonate: 42.9 mmol/L — ABNORMAL HIGH (ref 20.0–28.0)
Drawn by: 6509
Drawn by: 6643
FIO2: 28 %
FIO2: 36 %
O2 Saturation: 100 %
O2 Saturation: 96.8 %
Patient temperature: 36.5
Patient temperature: 36.7
pCO2, Ven: 49 mmHg (ref 44–60)
pCO2, Ven: 54 mmHg (ref 44–60)
pH, Ven: 7.5 — ABNORMAL HIGH (ref 7.25–7.43)
pH, Ven: 7.51 — ABNORMAL HIGH (ref 7.25–7.43)
pO2, Ven: 113 mmHg — ABNORMAL HIGH (ref 32–45)
pO2, Ven: 63 mmHg — ABNORMAL HIGH (ref 32–45)

## 2022-02-16 LAB — COMPREHENSIVE METABOLIC PANEL
ALT: 16 U/L (ref 0–44)
AST: 16 U/L (ref 15–41)
Albumin: 3.1 g/dL — ABNORMAL LOW (ref 3.5–5.0)
Alkaline Phosphatase: 52 U/L (ref 38–126)
Anion gap: 11 (ref 5–15)
BUN: 38 mg/dL — ABNORMAL HIGH (ref 8–23)
CO2: 36 mmol/L — ABNORMAL HIGH (ref 22–32)
Calcium: 8.8 mg/dL — ABNORMAL LOW (ref 8.9–10.3)
Chloride: 95 mmol/L — ABNORMAL LOW (ref 98–111)
Creatinine, Ser: 1.74 mg/dL — ABNORMAL HIGH (ref 0.61–1.24)
GFR, Estimated: 40 mL/min — ABNORMAL LOW (ref >60)
Glucose, Bld: 191 mg/dL — ABNORMAL HIGH (ref 70–99)
Potassium: 3.6 mmol/L (ref 3.5–5.1)
Sodium: 142 mmol/L (ref 135–145)
Total Bilirubin: 0.6 mg/dL (ref 0.3–1.2)
Total Protein: 5.6 g/dL — ABNORMAL LOW (ref 6.5–8.1)

## 2022-02-16 LAB — GLUCOSE, CAPILLARY
Glucose-Capillary: 157 mg/dL — ABNORMAL HIGH (ref 70–99)
Glucose-Capillary: 186 mg/dL — ABNORMAL HIGH (ref 70–99)
Glucose-Capillary: 153 mg/dL — ABNORMAL HIGH (ref 70–99)
Glucose-Capillary: 156 mg/dL — ABNORMAL HIGH (ref 70–99)

## 2022-02-16 LAB — HEMOGLOBIN A1C
Hgb A1c MFr Bld: 6 % — ABNORMAL HIGH (ref 4.8–5.6)
Mean Plasma Glucose: 125.5 mg/dL

## 2022-02-16 LAB — MAGNESIUM: Magnesium: 2.1 mg/dL (ref 1.7–2.4)

## 2022-02-16 MED ORDER — GUAIFENESIN ER 600 MG PO TB12
1200.0000 mg | ORAL_TABLET | Freq: Two times a day (BID) | ORAL | Status: DC
  Administered 2022-02-16 – 2022-02-23 (×15): 1200 mg via ORAL
  Filled 2022-02-16 (×15): qty 2

## 2022-02-16 MED ORDER — CEFTRIAXONE SODIUM 1 G IJ SOLR
1.0000 g | INTRAMUSCULAR | Status: DC
  Administered 2022-02-16 – 2022-02-17 (×2): 1 g via INTRAVENOUS
  Filled 2022-02-16 (×2): qty 10

## 2022-02-16 MED ORDER — INSULIN ASPART 100 UNIT/ML IJ SOLN
0.0000 [IU] | Freq: Three times a day (TID) | INTRAMUSCULAR | Status: DC
  Administered 2022-02-16 (×3): 3 [IU] via SUBCUTANEOUS
  Administered 2022-02-17: 2 [IU] via SUBCUTANEOUS
  Administered 2022-02-17 (×2): 3 [IU] via SUBCUTANEOUS
  Administered 2022-02-18: 5 [IU] via SUBCUTANEOUS
  Administered 2022-02-18 (×2): 3 [IU] via SUBCUTANEOUS
  Administered 2022-02-19 (×2): 5 [IU] via SUBCUTANEOUS
  Administered 2022-02-19: 2 [IU] via SUBCUTANEOUS
  Administered 2022-02-20 (×2): 3 [IU] via SUBCUTANEOUS
  Administered 2022-02-20: 5 [IU] via SUBCUTANEOUS
  Administered 2022-02-21: 3 [IU] via SUBCUTANEOUS
  Administered 2022-02-21: 2 [IU] via SUBCUTANEOUS
  Administered 2022-02-21: 3 [IU] via SUBCUTANEOUS
  Administered 2022-02-22: 2 [IU] via SUBCUTANEOUS
  Administered 2022-02-22 – 2022-02-23 (×2): 3 [IU] via SUBCUTANEOUS

## 2022-02-16 MED ORDER — SODIUM CHLORIDE 0.9 % IV SOLN: INTRAVENOUS | Status: DC

## 2022-02-16 MED ORDER — FLUTICASONE PROPIONATE 50 MCG/ACT NA SUSP
1.0000 | Freq: Every day | NASAL | Status: DC
  Administered 2022-02-16 – 2022-02-21 (×6): 1 via NASAL
  Filled 2022-02-16 (×3): qty 16

## 2022-02-16 NOTE — H&P (Signed)
?History and Physical  ? ? ?Patient: Daniel Barker MWU:[id number] DOB: [birth year] ?DOA: 02/15/2022 ?DOS: the patient was seen and examined on 02/16/2022 ?PCP: [name], MD  ?Patient coming from: Home ? ?Chief Complaint:  ?Chief Complaint  ?Patient presents with  ? Respiratory Distress  ? ?HPI: Daniel Barker is a 76 y.o. male with medical history significant of atrial for relation, CKD, COPD, hyperlipidemia, hypertension, coronary artery disease with stent, and more presents to ED with a chief complaint of dyspnea.  Patient reports that he was fine when he went to sleep last night.  He got up to go to the bathroom several times during the night and also felt fine.  When he woke up this morning he was acutely short of breath.  It was worse with exertion.  He had no associated chest pain, palpitations.  But he did have cough and wheeze.  It is nonproductive.  Patient denies any fevers.  He has a nebulizer at home and was using it every 6 hours throughout the day.  At first it seemed to make no difference, some of the later treatments gave temporary relief.  Patient reports that he does wear 2 L nasal cannula at home, he did not try to increase the supplementation.  Patient also reports he heard a rattling in his lungs, but could not cough anything up.  Patient is under the impression that ever since he started elevating his legs his breathing is worse.  He does not have peripheral edema at the time of my exam.  He did have 900 cc output in the ED, and has wrinkled skin in the lower extremities, like there was peripheral edema there initially.  He has had any changes to his torsemide at home.  He does report that his blood pressure was dropping too low so one of his blood pressure medications was changed.  He is not sure which 1.  Patient also reports he feels a fullness in his abdomen.  Today he is comfortable, but sometimes it feels like fluid is collecting there.  Lastly patient complains of numbness in his feet  that is chronic.  Patient has no other complaints at this time. ? ?Patient does not smoke, has about 1 drink of alcohol every few months, and does not use illicit drugs.  He has had 2 COVID vaccines and the flu vaccine.  Patient is full code. ?Review of Systems: As mentioned in the history of present illness. All other systems reviewed and are negative. ?Past Medical History:  ?Diagnosis Date  ? Acute metabolic encephalopathy 06/22/3663  ? Asthma   ? Atrial fibrillation ([place])   ? BPH (benign prostatic hyperplasia)   ? Cigarette smoker   ? CKD (chronic kidney disease)   ? COPD (chronic obstructive pulmonary disease) ([place])   ? Coronary artery disease   ? Cough   ? High cholesterol   ? Hx of CABG   ? Hypercholesteremia   ? Hypertension   ? Localized edema   ? Stented coronary artery   ? ?Past Surgical History:  ?Procedure Laterality Date  ? CARDIAC SURGERY    ? CORONARY ARTERY BYPASS GRAFT    ? ?Social History:  reports that he quit smoking about 17 months ago. His smoking use included cigarettes. He has a 60.00 pack-year smoking history. He has never used smokeless tobacco. He reports that he does not drink alcohol and does not use drugs. ? ?Allergies  ?Allergen Reactions  ? Lipitor [Atorvastatin]   ?  Unknown reaction  ? ? ?Family History  ?Problem Relation Age of Onset  ? Hypertension Mother   ? Clotting disorder Mother   ? COPD Father   ? ? ?Prior to Admission medications   ?Medication Sig Start Date End Date Taking? Authorizing Provider  ?albuterol (PROVENTIL) (2.5 MG/3ML) 0.083% nebulizer solution Take 3 mLs (2.5 mg total) by nebulization every 6 (six) hours as needed for wheezing or shortness of breath. DX: J44.9. 01/01/22  Yes Johnson, Clanford L, MD  ?albuterol (VENTOLIN HFA) 108 (90 Base) MCG/ACT inhaler Inhale 2 puffs into the lungs every 4 (four) hours as needed for wheezing or shortness of breath. 01/01/22  Yes Johnson, Clanford L, MD  ?amLODipine (NORVASC) 5 MG tablet Take 1 tablet (5 mg total) by mouth daily.  10/30/21  Yes Emokpae, Courage, MD  ?bisoprolol (ZEBETA) 5 MG tablet TAKE 1 TABLET(5 MG) BY MOUTH TWICE DAILY ?Patient taking differently: Take 5 mg by mouth 2 (two) times daily. 01/05/22  Yes Tanda Rockers, MD  ?budesonide-formoterol (SYMBICORT) 160-4.5 MCG/ACT inhaler USE 2 PUFFS BY MOUTH EVERY MORNING AND 2 PUFFS EVERY EVENING 11/10/21  Yes Emokpae, Courage, MD  ?clopidogrel (PLAVIX) 75 MG tablet Take 1 tablet (75 mg total) by mouth daily. 10/30/21  Yes Roxan Hockey, MD  ?doxycycline (VIBRAMYCIN) 100 MG capsule Take 1 capsule (100 mg total) by mouth 2 (two) times daily. 02/15/22  Yes Varney Biles, MD  ?fenofibrate micronized (LOFIBRA) 134 MG capsule Take 134 mg by mouth daily before breakfast.   Yes [provider]  ?fluticasone (FLONASE) 50 MCG/ACT nasal spray Place 1 spray into both nostrils daily.   Yes [provider]  ?montelukast (SINGULAIR) 10 MG tablet Take 10 mg by mouth daily.   Yes [provider]  ?pantoprazole (PROTONIX) 40 MG tablet Take 1 tablet (40 mg total) by mouth 2 (two) times daily. 10/30/21  Yes Emokpae, Courage, MD  ?polyethylene glycol powder (GLYCOLAX/MIRALAX) 17 GM/SCOOP powder Take 1 Container by mouth in the morning and at bedtime.   Yes [provider]  ?predniSONE (DELTASONE) 10 MG tablet Take 5 tablets (50 mg total) by mouth daily. 02/15/22  Yes Nanavati, Ankit, MD  ?propafenone (RYTHMOL) 225 MG tablet TAKE 1 TABLET(225 MG) BY MOUTH EVERY 8 HOURS ?Patient taking differently: Take 225 mg by mouth every 8 (eight) hours. 01/27/22  Yes Josue Hector, MD  ?tamsulosin (FLOMAX) 0.4 MG CAPS capsule Take 1 capsule (0.4 mg total) by mouth daily after supper. 10/30/21  Yes Emokpae, Courage, MD  ?Tiotropium Bromide Monohydrate (SPIRIVA RESPIMAT) 2.5 MCG/ACT AERS Inhale 2 puffs into the lungs daily. 10/30/21  Yes Roxan Hockey, MD  ?torsemide (DEMADEX) 20 MG tablet Take 1 tablet (20 mg total) by mouth See admin instructions. Take 1 Tablet 20 mg qam and half  tab (10 mg) every evening 10/30/21 10/30/22 Yes Emokpae, Courage, MD  ?zinc sulfate 220 (50 Zn) MG capsule Take 1 capsule (220 mg total) by mouth daily. ?Patient taking differently: Take 50 mg by mouth daily. 05/24/21  Yes Barton Dubois, MD  ?acetaminophen (TYLENOL) 500 MG tablet Take 1,000 mg by mouth every 6 (six) hours as needed for mild pain.    [provider]  ?amoxicillin-clavulanate (AUGMENTIN) 875-125 MG tablet Take 1 tablet by mouth 2 (two) times daily. ?Patient not taking: Reported on 02/15/2022 01/19/22   Maryjane Hurter, MD  ?Nebulizers (COMPRESSOR/NEBULIZER) MISC Inhale 1 Units into the lungs as needed. 10/30/21   Roxan Hockey, MD  ?potassium chloride SA (KLOR-CON M) 20 MEQ  tablet Take 1 tablet (20 mEq total) by mouth daily. Take will taking Torsemide/Demadex Fluid Medicine ?Patient not taking: Reported on 02/15/2022 10/30/21   Roxan Hockey, MD  ? ? ?Physical Exam: ?Vitals:  ? 02/15/22 2331 02/16/22 0003 02/16/22 0058 02/16/22 0139  ?BP: 109/67  102/64   ?Pulse: 87 89 77   ?Resp: (!) 22     ?Temp: 97.8 ?F (36.6 ?C)     ?TempSrc: Oral     ?SpO2: 99%   97%  ?Weight: 72.7 kg     ?Height: 5' 6.5" (1.689 m)     ? ?1.  General: ?Patient lying supine in bed,  no acute distress ?  ?2. Psychiatric: ?Alert and oriented x 3, mood and behavior normal for situation, pleasant and cooperative with exam ?  ?3. Neurologic: ?Speech and language are normal, face is symmetric, moves all 4 extremities voluntarily, at baseline without acute deficits on limited exam ?  ?4. HEENMT:  ?Head is atraumatic, normocephalic, pupils reactive to light, neck is supple, trachea is midline, mucous membranes are moist ?  ?5. Respiratory : ?Rhonchi in BL lung fields, increased work of breathing after rolling over in bed, at rest- speaking in full sentences, no cyanosis, 2L [place] in place ?  ?6. Cardiovascular : ?Heart rate normal, rhythm is regular, no murmurs, rubs or gallops, no peripheral edema, peripheral pulses palpated ?   ?7. Gastrointestinal:  ?Abdomen is soft, nondistended, nontender to palpation bowel sounds active, no masses or organomegaly palpated ?  ?8. Skin:  ?Skin is warm, dry and intact without rashes, acute

## 2022-02-16 NOTE — Plan of Care (Signed)
Pt remains mostly in the bed, SpO2 >95% overnight with no calls from tele. Urine output since admitting to floor 101ml in addition to 910ml reported by ED RN after giving lasix.  ? ?Skin: Pt has a unstageable wound on left second toe. See flowsheet for detail.  ? ? ?Problem: Education: ?Goal: Knowledge of disease or condition will improve ?Outcome: Progressing ?Goal: Knowledge of the prescribed therapeutic regimen will improve ?Outcome: Progressing ?  ?Problem: Activity: ?Goal: Ability to tolerate increased activity will improve ?Outcome: Progressing ?Goal: Will verbalize the importance of balancing activity with adequate rest periods ?Outcome: Progressing ?  ?Problem: Respiratory: ?Goal: Ability to maintain a clear airway will improve ?Outcome: Progressing ?Goal: Levels of oxygenation will improve ?Outcome: Progressing ?  ?

## 2022-02-16 NOTE — Assessment & Plan Note (Addendum)
-  Continue Plavix, beta-blocker, Fenofibrate ?-Continue to monitor ?

## 2022-02-16 NOTE — Assessment & Plan Note (Addendum)
-  Diet controlled mostly, but does have history of hyperglycemia induced by steroids ?-Hemoglobin A1c was 6.0 ?-Sliding scale coverage while in hospital as patient will be on steroids and he is on moderate NovoLog/scale AC ?-Continue to monitor CBGs per protocol and adjust insulin regimen as necessary ?-CBGs ranging from 111-136 ? ?

## 2022-02-16 NOTE — TOC Progression Note (Signed)
?  Transition of Care (TOC) Screening Note ? ? ?Patient Details  ?Name: Daniel Barker ?Date of Birth: [birth year] ? ? ?Transition of Care (TOC) CM/SW Contact:    ?Boneta Lucks, RN ?Phone Number: ?02/16/2022, 3:15 PM ? ? ? ?Transition of Care Department [facility]) has reviewed patient and no TOC needs have been identified at this time. We will continue to monitor patient advancement through interdisciplinary progression rounds. If new patient transition needs arise, please place a TOC consult. ? ? ? ?  ?Barriers to Discharge: Continued Medical Work up ?  ?  ?  ?  ?  ?  ? ? ?

## 2022-02-16 NOTE — Assessment & Plan Note (Addendum)
-  Triggered by Suspected pneumonia but PNA ruled out ?-Continue scheduled DuoNeb every 6 scheduled, as needed albuterol ?-Continue Steroids and received Solumedrol 125 mg x1 and will be continuing IV Solumedrol for now and start weaning  ?-Continued Zithromax but stopped Rocephin and now on Doxycycline  ?-Obtain Sputum Cx; Obtained 3/25 and growing Pseudomonas Aeruginosa and Abundant Diphtheroids (Cornyebacterium Species) but could be colonization given no evidence of PNA on CXR ?-Continued mometasone-formoterol 2 puffs IH twice daily but stopped and started him on Budesonide and Brovana ?-Continue with montelukast 10 mg p.o. daily and Fluticasone 50 mcg/ACT 1 spray each nare ?-We will add guaifenesin 1200 mg p.o. twice daily, incentive spirometry and flutter valve ?-Chest x-ray done and showed Bibasilar reticular and nodular densities, slightly progressed since the prior radiograph and may represent developing infiltrate. No focal consolidation, pleural effusion, pneumothorax. The cardiac silhouette is within limits. Atherosclerotic calcification of the aorta. Median sternotomy wires. No acute osseous pathology."  ?-Continue monitor respiratory status carefully and repeat chest x-ray in a.m. 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 No acute cardiopulmonary abnormality." ?? ?? ?

## 2022-02-16 NOTE — Assessment & Plan Note (Addendum)
-  Initially suspected to have a CAP but No evidence of pneumonia on chest x-ray yesterday but has a slight increased progression of his reticular densities; clinically I do not feel that he has a community-acquired pneumonia but his Sputum Cx is Growing Pseudomonas and Diptheroids   ?-Patient does have a leukocytosis, cough, dyspnea, and rhonchi on exam but this is likely related to COPD ?-Treating with Zithromax Rocephin empirically but have stopped the Rocephin and transitioned to Doxycycline  ?-Expectorated sputum culture if possible ?-Gentle IVF Hydration as above ?-SpO2: 96 % ?O2 Flow Rate (L/min): 3 L/min ?FiO2 (%): 97 %; Continue O2 weaning  ?-Treatment as above  ?-Continue to monitor respiratory status carefully repeat chest x-ray in a.m. ?-Pulmonary consulted as above ?

## 2022-02-16 NOTE — Progress Notes (Signed)
Care started prior to midnight in the emergency room and patient was admitted early this morning after midnight by Dr. Carlynn Purl and I am in current agreement with her assessment and plan.  Additional changes to the plan of care been made accordingly.  The patient is a 76 year old Caucasian male with past medical history significant for but not limited to atrial fibrillation, history of CKD, COPD, hypertension, hyperlipidemia, CAD with stenting as well as other comorbidities who presented to the ED with a chief complaint of dyspnea.  Patient reported that he was fine when he went to sleep last night and when he got up to go to the bathroom several times a night he also felt fine.  When he woke up early morning he was acutely short of breath and is worse with exertion.  He had no associated chest pain or palpitations but he did have a cough and a wheeze.  The cough is nonproductive and he denies any fevers.  He has a nebulizer at home and was using it every 6 hours throughout the day and at first it seemed to make no difference but then some later treatments give him Relief.  He reports that he does wear 2 L of oxygen via nasal cannula at home but he did not increase his O2 supplementation.  He also reported that he ordered "rattling in his lungs" but he could not cough anything up.  He states that ever since that he started elevating his legs and his breathing has been worse.  He denies any peripheral edema at the time of admission and he did have 900 cc output in the ED and has wrinkled skin on lower extremities with likely peripheral edema there initially.  He has not had any changes to his diuretics at home and continues to take his torsemide.  He does report soft blood pressures and also reported a feeling of fullness in the abdomen.  He states that he feels more comfortable but feels like there is more fluid collecting in his abdomen.  He also complains of numbness that is chronic and denies any other  complaints at this time. Currrently he is being admitted and treated for the following but not limited to: ? ?Acute COPD exacerbation (HCC) with chronic respiratory failure on home O2 of 2 L ?-Triggered by pneumonia ?-Continue scheduled DuoNeb every 6 scheduled, as needed albuterol ?-Continue Steroids and received Solumedrol 125 mg x1 and transition to po Prednisone in the AM  ?-Continue Zithromax and Rocephin ?-Obtain Sputum Cx ?-Continue mometasone-formoterol 2 puffs IH twice daily, montelukast 10 mg p.o. daily and Fluticasone 50 mcg/ACT 1 spray each nare ?-We will add guaifenesin 1200 mg p.o. twice daily, incentive spirometry and flutter valve ?-Chest x-ray done and showed "No active disease. Previous CABG. Mild chronic interstitial lung markings." ?-Continue monitor respiratory status carefully and repeat chest x-ray in a.m. ?  ?Metabolic alkalosis ?-Patient has a bicarb of 37 and a pH of 7.51; now patient's BUNs/creatinine is 38/1.74 with an anion gap of 11, chloride level 36 ?-This is after 900 cc urine output status post Lasix ?-Most likely secondary to hydrogen dumping into the urine ?-Patient may be slightly over diuresed as well ?-Was given Gentle IV hydration with NS at 50 mL/hr but will stop and resume his home Diuretics; Received IV Lasix on Admission ?-Recheck in the a.m. ?  ?Suspected CAP (community acquired pneumonia) ?-No evidence of pneumonia on chest x-ray ?-Patient does have a leukocytosis, cough, dyspnea, and rhonchi on exam ?-  Treating with Zithromax Rocephin ?-Expectorated sputum culture if possible ?-Gentle IVF Hydration as above ?-SpO2: 97 % ?O2 Flow Rate (L/min): 2 L/min ?-Treatment as above  ?-Continue to monitor respiratory status carefully repeat chest x-ray in a.m. ? ?Chronic Diastolic CHF ?-Possibly contributing to his shortness of breath and worsening dyspnea on exertion; does not appear to be in acute exacerbation ?-Given IV Lasix 60 mg/1 and then resumed on his home diuretics.  Was  getting IV fluid hydration but this has not been discontinued ?-Last echo showed an EF of 65 to 70% with grade 2 diastolic dysfunction ?-BNP was elevated at 409.0 but is improved from last check back in January of 568.0 ?-Resume home diuretics ?-Strict I's and O's and daily weights ?-Continue to monitor respiratory status carefully and repeat chest x-ray in the a.m. ? ? ?Mixed Hyperlipidemia ?-Continue Fenofibrate 54 mg po Daily  ?  ?Paroxysmal Atrial Fibrillation ([place]) ?-Continue bisoprolol 5 mg p.o. twice daily and Propafenone 225 mg po q8h ?-Continue to monitor on telemetry ?-Not currently on anticoagulation ?  ?Type 2 Diabetes Mellitus ([place]) ?-Diet controlled mostly, but does have history of hyperglycemia induced by steroids ?-Hemoglobin A1c was 6.0 ?-Sliding scale coverage while in hospital as patient will be on steroids and he is on moderate NovoLog/scale AC ?-Continue to monitor CBGs per protocol and adjust insulin regimen as necessary ? ?AKI on CKD stage IIIa ?-Patient's BUNs/creatinine was elevated and went from 38/1.96 and is now 38/1.74 ?-Continue with home diuretics and avoid further nephrotoxic medications, consciousness, hypotension and renally adjust medications ?-We will discontinue IV fluid hydration with normal saline at 50 cc/h ?-Repeat CMP in a.m. ? ?Macrocytic Anemia ?-Patient's hemoglobin/hematocrit went from 9.5/30.6 and is now trended down to 8.6/28.2 with an MCV of 102.5 ?-Check anemia panel in the a.m. ?-Continue to monitor for signs and symptoms of bleeding; currently no overt bleeding noted ?-Pete CBC in a.m. ? ?Thrombocytopenia ?-Patient's platelet count went from 120 is now 132 ?-Continue to monitor for signs and symptoms of bleeding; currently no overt bleeding noted ?-Continue monitor and trend and repeat CBC in a.m. ?  ?Atherosclerosis of coronary artery without angina pectoris ?-Continue Plavix, beta-blocker, Fenofibrate  ?Continue to monitor ?  ?We will continue to monitor the  patient's clinical response to intervention and repeat blood work and imaging in the a.m. ?

## 2022-02-16 NOTE — Assessment & Plan Note (Addendum)
-  Continue Fenofibrate 54 mg po Daily  ?

## 2022-02-16 NOTE — Progress Notes (Signed)
Patient complained of SOB, noted patient gurgling when in room. Patient lying low in bed. Patient reported he started feeling this way after using incentive spirometry and receiving neb treatment. Vital signs: T-98.2, Bp -132/67, R-20, P- 72, O2-94% at 2 liters. Patient adjusted in bed and stated he felt better, noted no more gurgling. MD Alfredia Ferguson made aware.  ?

## 2022-02-16 NOTE — Progress Notes (Signed)
Patient complaining of SOB, noted gurgling. Diminished breath sounds. Oxygen saturation 93% at 2 liter. Increased oxygen to 3 liters. Oxygen saturation 97%. MD Alfredia Ferguson made aware. Reported to on-coming nurse.  ?

## 2022-02-16 NOTE — Assessment & Plan Note (Addendum)
-  Patient has a bicarb of 39 and now patient's BUNs/creatinine has gone from 58/1.82 -> 75/2.20 with an anion gap of 8, chloride level 95 ?-Most likely secondary to hydrogen dumping into the urine initially  ?-Patient may be slightly over diuresed as well ?-IVF now stopped his home Diuretics; Received IV Lasix on Admission  ?-ABG was rechecked and he continues have a slight metabolic alkalosis as his pH was 7.49 and has bicarbonate on ABG was 44.7.  -His bicarbonate on the CMP was 39 ?-Continue to Monitor and Trend ?

## 2022-02-16 NOTE — Assessment & Plan Note (Addendum)
-  Continue Bisoprolol 5 mg p.o. twice daily and Propafenone 225 mg po q8h ?-Continue to monitor on telemetry ?-Not currently on anticoagulation ?

## 2022-02-17 ENCOUNTER — Inpatient Hospital Stay (HOSPITAL_COMMUNITY): Payer: Medicare Other

## 2022-02-17 DIAGNOSIS — I5032 Chronic diastolic (congestive) heart failure: Secondary | ICD-10-CM | POA: Diagnosis not present

## 2022-02-17 DIAGNOSIS — J9621 Acute and chronic respiratory failure with hypoxia: Secondary | ICD-10-CM

## 2022-02-17 DIAGNOSIS — N189 Chronic kidney disease, unspecified: Secondary | ICD-10-CM

## 2022-02-17 DIAGNOSIS — Z515 Encounter for palliative care: Secondary | ICD-10-CM

## 2022-02-17 DIAGNOSIS — N179 Acute kidney failure, unspecified: Secondary | ICD-10-CM

## 2022-02-17 DIAGNOSIS — D72829 Elevated white blood cell count, unspecified: Secondary | ICD-10-CM

## 2022-02-17 DIAGNOSIS — D696 Thrombocytopenia, unspecified: Secondary | ICD-10-CM

## 2022-02-17 DIAGNOSIS — D539 Nutritional anemia, unspecified: Secondary | ICD-10-CM

## 2022-02-17 DIAGNOSIS — J441 Chronic obstructive pulmonary disease with (acute) exacerbation: Secondary | ICD-10-CM | POA: Diagnosis not present

## 2022-02-17 LAB — COMPREHENSIVE METABOLIC PANEL
ALT: 13 U/L (ref 0–44)
AST: 10 U/L — ABNORMAL LOW (ref 15–41)
Albumin: 3 g/dL — ABNORMAL LOW (ref 3.5–5.0)
Alkaline Phosphatase: 46 U/L (ref 38–126)
Anion gap: 8 (ref 5–15)
BUN: 43 mg/dL — ABNORMAL HIGH (ref 8–23)
CO2: 38 mmol/L — ABNORMAL HIGH (ref 22–32)
Calcium: 9 mg/dL (ref 8.9–10.3)
Chloride: 97 mmol/L — ABNORMAL LOW (ref 98–111)
Creatinine, Ser: 2.05 mg/dL — ABNORMAL HIGH (ref 0.61–1.24)
GFR, Estimated: 33 mL/min — ABNORMAL LOW (ref >60)
Glucose, Bld: 116 mg/dL — ABNORMAL HIGH (ref 70–99)
Potassium: 4 mmol/L (ref 3.5–5.1)
Sodium: 143 mmol/L (ref 135–145)
Total Bilirubin: 0.6 mg/dL (ref 0.3–1.2)
Total Protein: 5.6 g/dL — ABNORMAL LOW (ref 6.5–8.1)

## 2022-02-17 LAB — CBC WITH DIFFERENTIAL/PLATELET
Abs Immature Granulocytes: 0.12 10*3/uL — ABNORMAL HIGH (ref 0.00–0.07)
Basophils Absolute: 0 10*3/uL (ref 0.0–0.1)
Basophils Relative: 0 %
Eosinophils Absolute: 0 10*3/uL (ref 0.0–0.5)
Eosinophils Relative: 0 %
HCT: 28.4 % — ABNORMAL LOW (ref 39.0–52.0)
Hemoglobin: 8.4 g/dL — ABNORMAL LOW (ref 13.0–17.0)
Immature Granulocytes: 1 %
Lymphocytes Relative: 5 %
Lymphs Abs: 0.7 10*3/uL (ref 0.7–4.0)
MCH: 31 pg (ref 26.0–34.0)
MCHC: 29.6 g/dL — ABNORMAL LOW (ref 30.0–36.0)
MCV: 104.8 fL — ABNORMAL HIGH (ref 80.0–100.0)
Monocytes Absolute: 0.7 10*3/uL (ref 0.1–1.0)
Monocytes Relative: 5 %
Neutro Abs: 10.7 10*3/uL — ABNORMAL HIGH (ref 1.7–7.7)
Neutrophils Relative %: 89 %
Platelets: 128 10*3/uL — ABNORMAL LOW (ref 150–400)
RBC: 2.71 MIL/uL — ABNORMAL LOW (ref 4.22–5.81)
RDW: 16.5 % — ABNORMAL HIGH (ref 11.5–15.5)
WBC: 12.2 10*3/uL — ABNORMAL HIGH (ref 4.0–10.5)
nRBC: 0 % (ref 0.0–0.2)

## 2022-02-17 LAB — BLOOD GAS, ARTERIAL
Acid-Base Excess: 18.8 mmol/L — ABNORMAL HIGH (ref 0.0–2.0)
Bicarbonate: 44.7 mmol/L — ABNORMAL HIGH (ref 20.0–28.0)
Drawn by: 27733
FIO2: 28 %
O2 Saturation: 98.5 %
Patient temperature: 36.4
pCO2 arterial: 58 mmHg — ABNORMAL HIGH (ref 32–48)
pH, Arterial: 7.49 — ABNORMAL HIGH (ref 7.35–7.45)
pO2, Arterial: 85 mmHg (ref 83–108)

## 2022-02-17 LAB — PHOSPHORUS: Phosphorus: 3.2 mg/dL (ref 2.5–4.6)

## 2022-02-17 LAB — MAGNESIUM: Magnesium: 2.2 mg/dL (ref 1.7–2.4)

## 2022-02-17 LAB — GLUCOSE, CAPILLARY
Glucose-Capillary: 123 mg/dL — ABNORMAL HIGH (ref 70–99)
Glucose-Capillary: 177 mg/dL — ABNORMAL HIGH (ref 70–99)
Glucose-Capillary: 179 mg/dL — ABNORMAL HIGH (ref 70–99)
Glucose-Capillary: 136 mg/dL — ABNORMAL HIGH (ref 70–99)

## 2022-02-17 MED ORDER — BUDESONIDE 0.25 MG/2ML IN SUSP
0.2500 mg | Freq: Two times a day (BID) | RESPIRATORY_TRACT | Status: DC
  Administered 2022-02-17 – 2022-02-19 (×4): 0.25 mg via RESPIRATORY_TRACT
  Filled 2022-02-17 (×4): qty 2

## 2022-02-17 MED ORDER — ARFORMOTEROL TARTRATE 15 MCG/2ML IN NEBU
15.0000 ug | INHALATION_SOLUTION | Freq: Two times a day (BID) | RESPIRATORY_TRACT | Status: DC
  Administered 2022-02-17 – 2022-02-23 (×12): 15 ug via RESPIRATORY_TRACT
  Filled 2022-02-17 (×12): qty 2

## 2022-02-17 MED ORDER — METHYLPREDNISOLONE SODIUM SUCC 125 MG IJ SOLR
60.0000 mg | Freq: Two times a day (BID) | INTRAMUSCULAR | Status: DC
  Administered 2022-02-17 – 2022-02-23 (×13): 60 mg via INTRAVENOUS
  Filled 2022-02-17 (×13): qty 2

## 2022-02-17 NOTE — Assessment & Plan Note (Addendum)
-  Worsened In the setting of Steroid Demargination as he is on IV Solumedrol 60 mg po BID and be hopefully transitioned to Prednisone soon ?-Patient's WBC went from 12.4 -> 10.3 -> 12.2 -> 9.6 -> 8.0 x2 -> 10.6 -> 15.8 ?-Continue to Monitor and is on Abx with Doxycycline now  ?-Repeat CBC in the AM  ?

## 2022-02-17 NOTE — Assessment & Plan Note (Addendum)
-  See COPD ?-Trid some oral morphine for symptom relief ?-Palliative care has been consulted for further goals of care discussion and CODE STATUS was discussed and patient does not want to be intubated has been made a partial code.  He is acceptable to CPR, defibrillation, ACLS meds and BiPAP.  He is active with outpatient palliative care and scheduled for next visit on 03/02/2022 ?-Patient feels that the morphine is helping him breathe but he is severely deconditioned and requesting to see PT again ? ?

## 2022-02-17 NOTE — Assessment & Plan Note (Addendum)
-  Patient's hemoglobin/hematocrit went from 9.5/30.6 -> 8.6/28.2 -> 8.4/248.4 -> 9.9/32.1 -> 9.4/30.8 -> 10.5/33.3 -> 10.2/33.9  -> 10.7/34.1 with an MCV of 104.0 ?-Checked anemia panel and showed an iron level of 100, U IBC 212, TIBC 312, saturation ratio 32%, ferritin level 68, folate level 6.9, vitamin B12 258 ?-Continue to monitor for signs and symptoms of bleeding; currently no overt bleeding noted ?-Repeat CBC in a.m. ?

## 2022-02-17 NOTE — Progress Notes (Signed)
Palliative: ?Thank you for this consult. Unfortunately due to high volume of consults there will be a delay in a Palliative Provider seeing this patient. ? ?Palliative Medicine will return to service on 02/21/2022 and will see patient at that time. ? ?No charge ?Quinn Axe, NP ?Palliative Medicine ?Please call Palliative Medicine team phone with any questions [phone]. ?For individual providers please see [facility]. ?

## 2022-02-17 NOTE — Progress Notes (Signed)
?   02/17/22 1312  ?Vitals  ?Temp (!) 97.5 ?F (36.4 ?C)  ?BP 134/71  ?MAP (mmHg) 89  ?BP Location Right Arm  ?BP Method Automatic  ?Patient Position (if appropriate) Lying  ?Pulse Rate 69  ?Pulse Rate Source Monitor  ?Resp 20  ?MEWS COLOR  ?MEWS Score Color Green  ?Oxygen Therapy  ?SpO2 98 %  ?O2 Device Nasal Cannula  ?O2 Flow Rate (L/min) 2 L/min  ?MEWS Score  ?MEWS Temp 0  ?MEWS Systolic 0  ?MEWS Pulse 0  ?MEWS RR 0  ?MEWS LOC 0  ?MEWS Score 0  ? ? ?

## 2022-02-17 NOTE — Assessment & Plan Note (Addendum)
-  Patient's platelet count went from 120 -> 132 -> 128 -> 140 -> 154 -> 167 -> 178 -> 181 and improved ?-Continue to monitor for signs and symptoms of bleeding; currently no overt bleeding noted ?-Continue monitor and trend and repeat CBC in a.m. ?? ?

## 2022-02-17 NOTE — TOC Initial Note (Signed)
Transition of Care (TOC) - Initial/Assessment Note  ? ? ?Patient Details  ?Name: Daniel Barker ?MRN: [id number] ?Date of Birth: 06/16/1946 ? ?Transition of Care (TOC) CM/SW Contact:    ?Iona Beard, LCSWA ?Phone Number: ?02/17/2022, 11:16 AM ? ?Clinical Narrative:                 ?Pt is high risk for readmission. Pt lives with his stepdaughter and wife. Pt states that he assists with caring for his wife who suffers from Dementia. Pt states that he has someone coming into the home M-F from 10-4 to assist with daily activities. Pt states that he is able to drive as needed. Pt states that he has had [employer] services but does not remember the company. Pt has a walker to use if needed. TOC to follow.  ? ?Expected Discharge Plan: Home/Self Care ?Barriers to Discharge: Continued Medical Work up ? ? ?Patient Goals and CMS Choice ?Patient states their goals for this hospitalization and ongoing recovery are:: Return home ?CMS Medicare.gov Compare Post Acute Care list provided to:: Patient ?Choice offered to / list presented to : Patient ? ?Expected Discharge Plan and Services ?Expected Discharge Plan: Home/Self Care ?In-house Referral: Clinical Social Work ?Discharge Planning Services: CM Consult ?  ?Living arrangements for the past 2 months: [place] ?                ?  ?  ?  ?  ?  ?  ?  ?  ?  ?  ? ?Prior Living Arrangements/Services ?Living arrangements for the past 2 months: [place] ?Lives with:: Self, Relatives ?Patient language and need for interpreter reviewed:: Yes ?Do you feel safe going back to the place where you live?: Yes      ?Need for Family Participation in Patient Care: Yes (Comment) ?Care giver support system in place?: Yes (comment) ?Current home services: DME ?Criminal Activity/Legal Involvement Pertinent to Current Situation/Hospitalization: No - Comment as needed ? ?Activities of Daily Living ?Home Assistive Devices/Equipment: Hearing aid, Chana Bode (specify type) ?ADL  Screening (condition at time of admission) ?Patient's cognitive ability adequate to safely complete daily activities?: Yes ?Is the patient deaf or have difficulty hearing?: Yes (abel to hear if hearing aid in place) ?Does the patient have difficulty seeing, even when wearing glasses/contacts?: No ?Does the patient have difficulty concentrating, remembering, or making decisions?: No ?Patient able to express need for assistance with ADLs?: Yes ?Does the patient have difficulty dressing or bathing?: Yes ?Independently performs ADLs?: No ?Communication: Independent ?Dressing (OT): Needs assistance ?Is this a change from baseline?: Change from baseline, expected to last <3days ?Grooming: Independent ?Feeding: Independent ?Bathing: Needs assistance ?Is this a change from baseline?: Change from baseline, expected to last <3 days ?Toileting: Needs assistance ?Is this a change from baseline?: Change from baseline, expected to last <3 days ?In/Out Bed: Needs assistance ?Is this a change from baseline?: Change from baseline, expected to last <3 days ?Walks in Home: Independent with device (comment) (walker) ?Does the patient have difficulty walking or climbing stairs?: Yes (due to shortness of breath) ?Weakness of Legs: Both ?Weakness of Arms/Hands: None ? ?Permission Sought/Granted ?  ?  ?   ?   ?   ?   ? ?Emotional Assessment ?Appearance:: Appears stated age ?Attitude/Demeanor/Rapport: Engaged ?Affect (typically observed): Accepting ?Orientation: : Oriented to Self, Oriented to Place, Oriented to  Time, Oriented to Situation ?Alcohol / Substance Use: Not Applicable ?Psych Involvement: No (comment) ? ?Admission diagnosis:  COPD exacerbation ([place]) [J44.1] ?Chronic respiratory failure with hypoxia ([place]) [J96.11] ?Acute on chronic respiratory failure with hypoxia (HCC) [J96.21] ?Patient Active Problem List  ? Diagnosis Date Noted  ? CAP (community acquired pneumonia) 02/16/2022  ? Metabolic alkalosis 83/38/2505  ? Acute on  chronic respiratory failure with hypoxia ([place]) 02/16/2022  ? Macrocytic anemia 12/29/2021  ? Thrombocytopenia ([place]) 12/29/2021  ? Elevated brain natriuretic peptide (BNP) level 12/29/2021  ? GERD (gastroesophageal reflux disease) 12/29/2021  ? Hypoalbuminemia due to protein-calorie malnutrition ([place]) 12/29/2021  ? COPD with acute exacerbation ([place]) 12/28/2021  ? Upper respiratory infection 11/15/2021  ? Productive cough 11/11/2021  ? Congestive heart failure ([place]) 11/09/2021  ? (HFpEF) heart failure with preserved ejection fraction - EF 65 to 70 % with Grade 2 DD 10/29/2021  ? Hypermagnesemia 10/28/2021  ? Acute exacerbation of CHF (congestive heart failure) ([place]) 10/27/2021  ? Hypokalemia 09/17/2021  ? Anemia 09/17/2021  ? History of peripheral edema 09/17/2021  ? Acute diastolic CHF (congestive heart failure) ([place]) 09/17/2021  ? Acute exacerbation of chronic obstructive airways disease ([place]) 09/16/2021  ? 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 08/18/2021  ? Steroid-induced hyperglycemia 08/18/2021  ? Acute hypoxemic respiratory failure ([place]) 08/09/2021  ? Hypoproteinemia ([place]) 07/18/2021  ? Acute respiratory failure with hypoxia and hypercarbia ([place]) 07/17/2021  ? COPD exacerbation ([place]) 07/17/2021  ? Chronic kidney disease, stage 3a ([place]) 07/03/2021  ? Acute on chronic diastolic CHF (congestive heart failure) ([place]) 07/03/2021  ? History of fracture 07/03/2021  ? SOB (shortness of breath) 06/05/2021  ? Elevated troponin 06/05/2021  ? Acute on chronic respiratory failure with hypoxia and hypercapnia (HCC) - baseline PCO2 80-90s, chronically on 2 L/min at home. 05/19/2021  ? COVID-19 virus infection 05/18/2021  ? Essential tremor 05/04/2021  ? Paroxysmal atrial fibrillation ([place]) 04/25/2021  ? Chronic kidney disease 04/25/2021  ? Asthma 04/25/2021  ? H/O coronary angioplasty 04/25/2021  ? Impaired fasting glucose 04/25/2021  ? Cellulitis 04/25/2021  ? Edema 04/25/2021  ? Nicotine dependence 04/25/2021  ? Overweight 04/25/2021   ? Prediabetes 04/25/2021  ? Tremor 04/25/2021  ? Vitamin D deficiency 04/25/2021  ? Mixed hyperlipidemia 04/16/2021  ? Closed displaced spiral fracture of shaft of left humerus 12/23/2020  ? Chronic respiratory failure with hypoxia ([place]) 09/02/2020  ? Urinary retention 07/08/2020  ? Benign prostatic hyperplasia with lower urinary tract symptoms 07/08/2020  ? COPD (chronic obstructive pulmonary disease) ([place]) 05/28/2020  ? Cigarette smoker 05/28/2020  ? Acute kidney injury superimposed on CKD ([place]) 06/03/2014  ? Anemia of chronic disease 06/03/2014  ? Atherosclerosis of coronary artery without angina pectoris 06/03/2014  ? Acute respiratory failure with hypercapnia ([place]) 06/01/2014  ? Type 2 diabetes mellitus ([place]) 06/01/2014  ? Low blood pressure 06/01/2014  ? ?PCP:  [name], MD ?Pharmacy:   ?[facility], [place] [address] ?[address] ?[place] [address] ?Phone: [phone] Fax: [phone] ? ? ? ? ?Social Determinants of Health (SDOH) Interventions ?  ? ?Readmission Risk Interventions ? ?  02/17/2022  ? 11:15 AM 12/31/2021  ?  1:32 PM 10/28/2021  ?  8:52 AM  ?Readmission Risk Prevention Plan  ?Transportation Screening Complete Complete Complete  ?Medication Review Press photographer) Complete Complete Complete  ?[place] or Home Care Consult Complete Complete Complete  ?SW Recovery Care/Counseling Consult Complete Complete Complete  ?Palliative Care Screening Not Applicable Not Applicable Not Applicable  ?[place] Not Applicable Not Applicable Not Applicable  ? ? ? ?

## 2022-02-17 NOTE — Assessment & Plan Note (Addendum)
-  Possibly contributing to his shortness of breath and worsening dyspnea on exertion; does not appear to be in acute exacerbation ?-Given IV Lasix 60 mg x1 and then resumed on his home diuretics.  Was getting IV fluid hydration but this has not been discontinued ?-Last echo showed an EF of 65 to 70% with grade 2 diastolic dysfunction ?-BNP was elevated at 409.0 but is improved from last check back in January of 568.0 ?-Resume home diuretics ?-Strict I's and O's and daily weights; Patient is -15.085 Liters ?-Continue to monitor respiratory status carefully and repeat chest x-ray in the a.m. ?

## 2022-02-17 NOTE — Hospital Course (Addendum)
HPI per Dr. Carlynn Purl on 02/16/22 ?HPI: Daniel Barker is a 76 y.o. male with medical history significant of atrial for relation, CKD, COPD, hyperlipidemia, hypertension, coronary artery disease with stent, and more presents to ED with a chief complaint of dyspnea.  Patient reports that he was fine when he went to sleep last night.  He got up to go to the bathroom several times during the night and also felt fine.  When he woke up this morning he was acutely short of breath.  It was worse with exertion.  He had no associated chest pain, palpitations.  But he did have cough and wheeze.  It is nonproductive.  Patient denies any fevers.  He has a nebulizer at home and was using it every 6 hours throughout the day.  At first it seemed to make no difference, some of the later treatments gave temporary relief.  Patient reports that he does wear 2 L nasal cannula at home, he did not try to increase the supplementation.  Patient also reports he heard a rattling in his lungs, but could not cough anything up.  Patient is under the impression that ever since he started elevating his legs his breathing is worse.  He does not have peripheral edema at the time of my exam.  He did have 900 cc output in the ED, and has wrinkled skin in the lower extremities, like there was peripheral edema there initially.  He has had any changes to his torsemide at home.  He does report that his blood pressure was dropping too low so one of his blood pressure medications was changed.  He is not sure which 1.  Patient also reports he feels a fullness in his abdomen.  Today he is comfortable, but sometimes it feels like fluid is collecting there.  Lastly patient complains of numbness in his feet that is chronic.  Patient has no other complaints at this time. ?  ?Patient does not smoke, has about 1 drink of alcohol every few months, and does not use illicit drugs.  He has had 2 COVID vaccines and the flu vaccine.  Patient is full  code. ? ?**Interim History ?Patient is slow to improve and feels about 70% better today and states he is able to cough up a little sputum.  He was given IV Solu-Medrol and will continue this today.  Adjustments have been made in his breathing treatments and we have stopped his inhaler and started Brovana and budesonide in addition to his other neb treatments.  We will continue his home torsemide and he will need an ambulatory home O2 screen prior to discharge.  Given his slow improvement we will discuss with pulmonary about further evaluation recommendations and patient does not wish to see Dr. [name] currently so will hold off formal consultation.  ? ?He has a toe ulcer noted on his second toe so have asked [place] nurse to evaluate and they have recommended topical care. Foot X-Ray showed "Normal bone mineralization. Minimal great toe metatarsophalangeal lateral joint space narrowing and lateral peripheral degenerative osteophytes. Tiny well corticated chronic ossicle just distal to the fibula, likely the sequela of remote trauma. Small plantar calcaneal heel spur. No cortical  erosion. No subcutaneous air. No acute fracture or dislocation." ?We will work the patient up and get ABIs to ensure proper blood flow to the feet. ? ?Patient continues to be intermittently dyspneic and had to be placed on 4 L supplemental oxygen yesterday morning but was weaned back down.  I discussed with him about starting morphine for his dyspnea and he is agreeable.  PT OT did recommend home health.  Pulmonary to see the patient in the morning and palliative to have further goals of care discussion.  Patient is slowly improving and pulmonary evaluated and he has end-stage COPD with emphysema with little reserve and his lung function and pulmonology feels that he cannot improve much more beyond where he is at presently.  They are recommending setting limits on his care and continuing further goals of care discussion and recommended continue  as needed morphine for dyspnea. ? ?Continues to be deconditioned and was little worse/so consulted PT again for reevaluation.  Sputum cultures growing out Pseudomonas and corynebacterium and he is on doxycycline which we will continue and change if necessary.  Palliative care to have continued goals of care discussion with the patient. ?

## 2022-02-17 NOTE — Progress Notes (Signed)
?PROGRESS NOTE ? ? ? ?Daniel Barker  LOV:[id number] DOB: Nov 23, 1946 DOA: 02/15/2022 ?PCP: [name], MD  ? ?Brief Narrative:  ?HPI per Dr. Carlynn Purl on 02/16/22 ?HPI: Daniel Barker is a 76 y.o. male with medical history significant of atrial for relation, CKD, COPD, hyperlipidemia, hypertension, coronary artery disease with stent, and more presents to ED with a chief complaint of dyspnea.  Patient reports that he was fine when he went to sleep last night.  He got up to go to the bathroom several times during the night and also felt fine.  When he woke up this morning he was acutely short of breath.  It was worse with exertion.  He had no associated chest pain, palpitations.  But he did have cough and wheeze.  It is nonproductive.  Patient denies any fevers.  He has a nebulizer at home and was using it every 6 hours throughout the day.  At first it seemed to make no difference, some of the later treatments gave temporary relief.  Patient reports that he does wear 2 L nasal cannula at home, he did not try to increase the supplementation.  Patient also reports he heard a rattling in his lungs, but could not cough anything up.  Patient is under the impression that ever since he started elevating his legs his breathing is worse.  He does not have peripheral edema at the time of my exam.  He did have 900 cc output in the ED, and has wrinkled skin in the lower extremities, like there was peripheral edema there initially.  He has had any changes to his torsemide at home.  He does report that his blood pressure was dropping too low so one of his blood pressure medications was changed.  He is not sure which 1.  Patient also reports he feels a fullness in his abdomen.  Today he is comfortable, but sometimes it feels like fluid is collecting there.  Lastly patient complains of numbness in his feet that is chronic.  Patient has no other complaints at this time. ?  ?Patient does not smoke, has about 1 drink of alcohol  every few months, and does not use illicit drugs.  He has had 2 COVID vaccines and the flu vaccine.  Patient is full code. ? ?**Interim History ?Patient is slow to improve and feels about 60% better.  He was given IV Solu-Medrol yesterday and will continue this today.  Adjustments have been made in his breathing treatments and we have stopped his inhaler and started Brovana and budesonide in addition to his other neb treatments.  We will continue his home torsemide and he will need an ambulatory home O2 screen prior to discharge.  Given his slow improvement we will discuss with pulmonary about further evaluation recommendations  ? ? ?Assessment and Plan: ?* COPD exacerbation ([place]) ?-Triggered by pneumonia ?-Continue scheduled DuoNeb every 6 scheduled, as needed albuterol ?-Continue Steroids and received Solumedrol 125 mg x1 and transition to po Prednisone in the AM  ?-Continue Zithromax and Rocephin ?-Obtain Sputum Cx ?-Continued mometasone-formoterol 2 puffs IH twice daily "we will stop and start him on budesonide and Brovana ?-Continue with montelukast 10 mg p.o. daily and Fluticasone 50 mcg/ACT 1 spray each nare ?-We will add guaifenesin 1200 mg p.o. twice daily, incentive spirometry and flutter valve ?-Chest x-ray done and showed "No active disease. Previous CABG. Mild chronic interstitial lung markings." ?-Continue monitor respiratory status carefully and repeat chest x-ray in a.m. ?-We will discuss with  pulmonary about further evaluation recommendations ?  ? ?Leukocytosis ?-In the setting of Steroid Demargination ?-Patient's WBC went from 12.4 -> 10.3 -> 12.2 ?-Continue to Monitor and is on Abx ?-Repeat CBC in the AM  ? ?Acute kidney injury superimposed on chronic kidney disease ([place]) ?-Baseline Cr is Stage 3a ?-Patient's BUNs/creatinine was elevated and went from 38/1.96 -> 38/1.74 -> 43/2.03 ?-Continue with home diuretics and avoid further nephrotoxic medications, consciousness, hypotension and renally  adjust medications ?-We will discontinue IV fluid hydration with normal saline at 50 cc/h ?-Repeat CMP in a.m. ? ?Chronic diastolic CHF (congestive heart failure) ([place]) ?-Possibly contributing to his shortness of breath and worsening dyspnea on exertion; does not appear to be in acute exacerbation ?-Given IV Lasix 60 mg/1 and then resumed on his home diuretics.  Was getting IV fluid hydration but this has not been discontinued ?-Last echo showed an EF of 65 to 70% with grade 2 diastolic dysfunction ?-BNP was elevated at 409.0 but is improved from last check back in January of 568.0 ?-Resume home diuretics ?-Strict I's and O's and daily weights; Patient is -2.217 Liters ?-Continue to monitor respiratory status carefully and repeat chest x-ray in the a.m. ? ?Acute on chronic respiratory failure with hypoxia (HCC) ?-See COPD ? ?Metabolic alkalosis ?-Patient has a bicarb of 37 and a pH of 7.51; now patient's BUNs/creatinine is 38/1.74 with an anion gap of 11, chloride level 36 ?-This is after 900 cc urine output status post Lasix ?-Most likely secondary to hydrogen dumping into the urine ?-Patient may be slightly over diuresed as well ?-Was given Gentle IV hydration with NS at 50 mL/hr but will stop and resume his home Diuretics; Received IV Lasix on Admission ?-ABG was rechecked today and he continues have a slight metabolic alkalosis as his pH was 7.49 and has bicarbonate on ABG was 44.7.  -His bicarbonate on the CMP was 38 ?-Continue to Monitor and Trend ? ?CAP (community acquired pneumonia) ?-Initially suspected to have a CAP but No evidence of pneumonia on chest x-ray ?-Patient does have a leukocytosis, cough, dyspnea, and rhonchi on exam ?-Treating with Zithromax Rocephin empirically ?-Expectorated sputum culture if possible ?-Gentle IVF Hydration as above ?-SpO2: 98 % ?O2 Flow Rate (L/min): 2 L/min ?FiO2 (%): 97 % ?-Treatment as above  ?-Continue to monitor respiratory status carefully repeat chest x-ray in  a.m. ? ?Thrombocytopenia ([place]) ?-Patient's platelet count went from 120 is now 132 and today is 128 ?-Continue to monitor for signs and symptoms of bleeding; currently no overt bleeding noted ?-Continue monitor and trend and repeat CBC in a.m. ?  ? ?Macrocytic anemia ?-Patient's hemoglobin/hematocrit went from 9.5/30.6 and is now trended down to 8.6/28.2 yesterday and today is 8.4/248.4 with an MCV of 104.8 ?-Check anemia panel in the a.m. ?-Continue to monitor for signs and symptoms of bleeding; currently no overt bleeding noted ?-Repeat CBC in a.m. ? ?Mixed hyperlipidemia ?-Continue Fenofibrate 54 mg po Daily  ? ?Paroxysmal atrial fibrillation (HCC) ?-Continue bisoprolol 5 mg p.o. twice daily and Propafenone 225 mg po q8h ?-Continue to monitor on telemetry ?-Not currently on anticoagulation ? ?Type 2 diabetes mellitus ([place]) ?Diet controlled mostly, but does have history of hyperglycemia induced by steroids ?-Hemoglobin A1c was 6.0 ?-Sliding scale coverage while in hospital as patient will be on steroids and he is on moderate NovoLog/scale AC ?-Continue to monitor CBGs per protocol and adjust insulin regimen as necessary ? ?Atherosclerosis of coronary artery without angina pectoris ?-Continue Plavix, beta-blocker, fenofibric ?-Continue to monitor ? ?  DVT prophylaxis: heparin injection 5,000 Units Start: 02/16/22 0600 ?SCDs Start: 02/15/22 2336 ? ?  Code Status: Full Code ?Family Communication: Called Patient's Daughter [name]  ? ?Disposition Plan:  ?Level of care: Telemetry ?Status is: Inpatient ?Remains inpatient appropriate because: Continues to be dyspneic  ?  ?Consultants:  ?Will discuss with Pulmonary  ?Palliative Care ? ?Procedures:  ?None ? ?Antimicrobials:  ?Anti-infectives (From admission, onward)  ? ? Start     Dose/Rate Route Frequency Ordered Stop  ? 02/16/22 0345  cefTRIAXone (ROCEPHIN) 1 g in sodium chloride 0.9 % 100 mL IVPB       ? 1 g ?200 mL/hr over 30 Minutes Intravenous Every 24 hours 02/16/22  0256    ? 02/15/22 2230  azithromycin (ZITHROMAX) 500 mg in sodium chloride 0.9 % 250 mL IVPB       ? 500 mg ?250 mL/hr over 60 Minutes Intravenous Every 24 hours 02/15/22 2220    ? 02/15/22 2100  doxycycline (VIB

## 2022-02-17 NOTE — Assessment & Plan Note (Addendum)
-  Baseline Cr is Stage 3a or 3b but likely 3b ?-Patient's BUNs/creatinine was elevated and went from 38/1.96 -> 38/1.74 -> 43/2.03 -> 43/1.72 -> 46/1.89 -> 46/1.89 -> 58/1.82 -> 75/2.20 ?-Continue with home diuretics and avoid further nephrotoxic medications, consciousness, hypotension and renally adjust medications ?-Discontinued IVF ?-Repeat CMP in a.m. ?

## 2022-02-18 ENCOUNTER — Inpatient Hospital Stay (HOSPITAL_COMMUNITY): Payer: Medicare Other

## 2022-02-18 DIAGNOSIS — J441 Chronic obstructive pulmonary disease with (acute) exacerbation: Secondary | ICD-10-CM | POA: Diagnosis not present

## 2022-02-18 DIAGNOSIS — I5032 Chronic diastolic (congestive) heart failure: Secondary | ICD-10-CM | POA: Diagnosis not present

## 2022-02-18 DIAGNOSIS — L97509 Non-pressure chronic ulcer of other part of unspecified foot with unspecified severity: Secondary | ICD-10-CM

## 2022-02-18 DIAGNOSIS — L97529 Non-pressure chronic ulcer of other part of left foot with unspecified severity: Secondary | ICD-10-CM

## 2022-02-18 DIAGNOSIS — N179 Acute kidney failure, unspecified: Secondary | ICD-10-CM | POA: Diagnosis not present

## 2022-02-18 DIAGNOSIS — J9621 Acute and chronic respiratory failure with hypoxia: Secondary | ICD-10-CM | POA: Diagnosis not present

## 2022-02-18 LAB — GLUCOSE, CAPILLARY
Glucose-Capillary: 153 mg/dL — ABNORMAL HIGH (ref 70–99)
Glucose-Capillary: 178 mg/dL — ABNORMAL HIGH (ref 70–99)
Glucose-Capillary: 209 mg/dL — ABNORMAL HIGH (ref 70–99)
Glucose-Capillary: 124 mg/dL — ABNORMAL HIGH (ref 70–99)

## 2022-02-18 LAB — COMPREHENSIVE METABOLIC PANEL
ALT: 14 U/L (ref 0–44)
AST: 13 U/L — ABNORMAL LOW (ref 15–41)
Albumin: 3.2 g/dL — ABNORMAL LOW (ref 3.5–5.0)
Alkaline Phosphatase: 50 U/L (ref 38–126)
Anion gap: 12 (ref 5–15)
BUN: 43 mg/dL — ABNORMAL HIGH (ref 8–23)
CO2: 37 mmol/L — ABNORMAL HIGH (ref 22–32)
Calcium: 9.3 mg/dL (ref 8.9–10.3)
Chloride: 95 mmol/L — ABNORMAL LOW (ref 98–111)
Creatinine, Ser: 1.72 mg/dL — ABNORMAL HIGH (ref 0.61–1.24)
GFR, Estimated: 41 mL/min — ABNORMAL LOW (ref >60)
Glucose, Bld: 163 mg/dL — ABNORMAL HIGH (ref 70–99)
Potassium: 4 mmol/L (ref 3.5–5.1)
Sodium: 144 mmol/L (ref 135–145)
Total Bilirubin: 0.5 mg/dL (ref 0.3–1.2)
Total Protein: 6.1 g/dL — ABNORMAL LOW (ref 6.5–8.1)

## 2022-02-18 LAB — CBC WITH DIFFERENTIAL/PLATELET
Abs Immature Granulocytes: 0.14 10*3/uL — ABNORMAL HIGH (ref 0.00–0.07)
Basophils Absolute: 0 10*3/uL (ref 0.0–0.1)
Basophils Relative: 0 %
Eosinophils Absolute: 0 10*3/uL (ref 0.0–0.5)
Eosinophils Relative: 0 %
HCT: 32.1 % — ABNORMAL LOW (ref 39.0–52.0)
Hemoglobin: 9.9 g/dL — ABNORMAL LOW (ref 13.0–17.0)
Immature Granulocytes: 2 %
Lymphocytes Relative: 4 %
Lymphs Abs: 0.4 10*3/uL — ABNORMAL LOW (ref 0.7–4.0)
MCH: 32.8 pg (ref 26.0–34.0)
MCHC: 30.8 g/dL (ref 30.0–36.0)
MCV: 106.3 fL — ABNORMAL HIGH (ref 80.0–100.0)
Monocytes Absolute: 0.2 10*3/uL (ref 0.1–1.0)
Monocytes Relative: 2 %
Neutro Abs: 8.9 10*3/uL — ABNORMAL HIGH (ref 1.7–7.7)
Neutrophils Relative %: 92 %
Platelets: 140 10*3/uL — ABNORMAL LOW (ref 150–400)
RBC: 3.02 MIL/uL — ABNORMAL LOW (ref 4.22–5.81)
RDW: 15.9 % — ABNORMAL HIGH (ref 11.5–15.5)
WBC: 9.6 10*3/uL (ref 4.0–10.5)
nRBC: 0.2 % (ref 0.0–0.2)

## 2022-02-18 LAB — MAGNESIUM: Magnesium: 2.4 mg/dL (ref 1.7–2.4)

## 2022-02-18 LAB — PHOSPHORUS: Phosphorus: 3.6 mg/dL (ref 2.5–4.6)

## 2022-02-18 NOTE — Assessment & Plan Note (Addendum)
-  Noted to have a Toe Ulcer on the 2nd toe of Left Foot ?-WOC Nurse Consult ?-Will order a Plain Film X-Ray and showed "Normal bone mineralization. Minimal great toe metatarsophalangeal lateral joint space narrowing and lateral peripheral degenerative osteophytes. Tiny well corticated chronic ossicle just distal to the fibula, likely the sequela of remote trauma. Small plantar calcaneal heel spur. No cortical erosion. No subcutaneous air. No acute fracture or dislocation." ?-Will order ABI's while hospitalized and showed "Resting ABI of the bilateral lower extremity in the moderate range arterial occlusive disease. ? ?Monophasic waveforms at the bilateral ankles." -Patient is not Diabetic but is Pre-Diabetic  ?-Consider further Imaging and Workup but patient is not complaining of any pain currently ?-Will need Podiatric Follow up at D/C  ?

## 2022-02-18 NOTE — Progress Notes (Signed)
?PROGRESS NOTE ? ? ? ?Daniel Barker  ZGY:[id number] DOB: 11/04/1946 DOA: 02/15/2022 ?PCP: [name], MD  ? ?Brief Narrative:  ?HPI per Dr. Carlynn Purl on 02/16/22 ?HPI: Daniel Barker is a 76 y.o. male with medical history significant of atrial for relation, CKD, COPD, hyperlipidemia, hypertension, coronary artery disease with stent, and more presents to ED with a chief complaint of dyspnea.  Patient reports that he was fine when he went to sleep last night.  He got up to go to the bathroom several times during the night and also felt fine.  When he woke up this morning he was acutely short of breath.  It was worse with exertion.  He had no associated chest pain, palpitations.  But he did have cough and wheeze.  It is nonproductive.  Patient denies any fevers.  He has a nebulizer at home and was using it every 6 hours throughout the day.  At first it seemed to make no difference, some of the later treatments gave temporary relief.  Patient reports that he does wear 2 L nasal cannula at home, he did not try to increase the supplementation.  Patient also reports he heard a rattling in his lungs, but could not cough anything up.  Patient is under the impression that ever since he started elevating his legs his breathing is worse.  He does not have peripheral edema at the time of my exam.  He did have 900 cc output in the ED, and has wrinkled skin in the lower extremities, like there was peripheral edema there initially.  He has had any changes to his torsemide at home.  He does report that his blood pressure was dropping too low so one of his blood pressure medications was changed.  He is not sure which 1.  Patient also reports he feels a fullness in his abdomen.  Today he is comfortable, but sometimes it feels like fluid is collecting there.  Lastly patient complains of numbness in his feet that is chronic.  Patient has no other complaints at this time. ?  ?Patient does not smoke, has about 1 drink of alcohol  every few months, and does not use illicit drugs.  He has had 2 COVID vaccines and the flu vaccine.  Patient is full code. ? ?**Interim History ?Patient is slow to improve and feels about 70% better today and states he is able to cough up a little sputum.  He was given IV Solu-Medrol and will continue this today.  Adjustments have been made in his breathing treatments and we have stopped his inhaler and started Brovana and budesonide in addition to his other neb treatments.  We will continue his home torsemide and he will need an ambulatory home O2 screen prior to discharge.  Given his slow improvement we will discuss with pulmonary about further evaluation recommendations and patient does not wish to see Dr. [name] currently so will hold off formal consultation.  ? ?He has a toe ulcer noted on his second toe so have asked [place] nurse to evaluate and they have recommended topical care. Foot X-Ray showed "Normal bone mineralization. Minimal great toe metatarsophalangeal lateral joint space narrowing and lateral peripheral degenerative osteophytes. Tiny well corticated chronic ossicle just distal to the fibula, likely the sequela of remote trauma. Small plantar calcaneal heel spur. No cortical  erosion. No subcutaneous air. No acute fracture or dislocation." ?   ? ? ?Assessment and Plan: ?* COPD exacerbation ([place]) ?-Triggered by Suspected pneumonia but PNA  ruled out ?-Continue scheduled DuoNeb every 6 scheduled, as needed albuterol ?-Continue Steroids and received Solumedrol 125 mg x1 and will be continuing IV Solumedrol  ?-Continue Zithromax but stopped Rocephin ?-Obtain Sputum Cx ?-Continued mometasone-formoterol 2 puffs IH twice daily but stopped and started him on budesonide and Brovana ?-Continue with montelukast 10 mg p.o. daily and Fluticasone 50 mcg/ACT 1 spray each nare ?-We will add guaifenesin 1200 mg p.o. twice daily, incentive spirometry and flutter valve ?-Chest x-ray done and showed "No active disease.  Previous CABG. Mild chronic interstitial lung markings." ?-Continue monitor respiratory status carefully and repeat chest x-ray in a.m. ?-We will discuss with pulmonary about further evaluation recommendations but he does not want to see the Pulmonologist  ?  ? ?Toe ulcer ([place]) ?-Noted to have a Toe Ulcer on the 2nd toe of Left Foot ?-WOC Nurse Consult ?-Will order a Plain Film X-Ray and showed "Normal bone mineralization. Minimal great toe metatarsophalangeal lateral joint space narrowing and lateral peripheral degenerative osteophytes. Tiny well corticated chronic ossicle just distal to the fibula, likely the sequela of remote trauma. Small plantar calcaneal heel spur. No cortical erosion. No subcutaneous air. No acute fracture or dislocation." ?-Consider ABI's while hospitalized. Patient is not Diabetic but is Pre-Diabetic  ?-Consider further Imaging and Workup but patient is not complaining of any pain   ? ?Leukocytosis ?-Worsened In the setting of Steroid Demargination ?-Patient's WBC went from 12.4 -> 10.3 -> 12.2 -> 9.6 ?-Continue to Monitor and is on Abx ?-Repeat CBC in the AM  ? ?Acute kidney injury superimposed on chronic kidney disease ([place]) ?-Baseline Cr is Stage 3a ?-Patient's BUNs/creatinine was elevated and went from 38/1.96 -> 38/1.74 -> 43/2.03 -> 43/1.72 ?-Continue with home diuretics and avoid further nephrotoxic medications, consciousness, hypotension and renally adjust medications ?-Discontinued IVF ?-Repeat CMP in a.m. ? ?Chronic diastolic CHF (congestive heart failure) ([place]) ?-Possibly contributing to his shortness of breath and worsening dyspnea on exertion; does not appear to be in acute exacerbation ?-Given IV Lasix 60 mg/1 and then resumed on his home diuretics.  Was getting IV fluid hydration but this has not been discontinued ?-Last echo showed an EF of 65 to 70% with grade 2 diastolic dysfunction ?-BNP was elevated at 409.0 but is improved from last check back in January of  568.0 ?-Resume home diuretics ?-Strict I's and O's and daily weights; Patient is -2.917 Liters ?-Continue to monitor respiratory status carefully and repeat chest x-ray in the a.m. ? ?Acute on chronic respiratory failure with hypoxia (HCC) ?-See COPD ? ?Metabolic alkalosis ?-Patient has a bicarb of 37 and now patient's BUNs/creatinine is 43/1.72 with an anion gap of 12, chloride level 95 ?-This is after 900 cc urine output status post Lasix ?-Most likely secondary to hydrogen dumping into the urine ?-Patient may be slightly over diuresed as well ?-Was given Gentle IV hydration with NS at 50 mL/hr but will stop and resume his home Diuretics; Received IV Lasix on Admission ?-ABG was rechecked today and he continues have a slight metabolic alkalosis as his pH was 7.49 and has bicarbonate on ABG was 44.7.  -His bicarbonate on the CMP was 37 ?-Continue to Monitor and Trend ? ?CAP (community acquired pneumonia) ?-Initially suspected to have a CAP but No evidence of pneumonia on chest x-ray ?-Patient does have a leukocytosis, cough, dyspnea, and rhonchi on exam but this is likely related to COPD ?-Treating with Zithromax Rocephin empirically but have stopped the Rocephin  ?-Expectorated sputum culture if possible ?-Gentle IVF  Hydration as above ?-SpO2: 98 % ?O2 Flow Rate (L/min): 2 L/min ?FiO2 (%): 97 % ?-Treatment as above  ?-Continue to monitor respiratory status carefully repeat chest x-ray in a.m. ? ?Thrombocytopenia ([place]) ?-Patient's platelet count went from 120 -> 132 -> 128 -> 140 ?-Continue to monitor for signs and symptoms of bleeding; currently no overt bleeding noted ?-Continue monitor and trend and repeat CBC in a.m. ?  ? ?Macrocytic anemia ?-Patient's hemoglobin/hematocrit went from 9.5/30.6 -> 8.6/28.2 -> 8.4/248.4 -> 9.9/32.1 with an MCV of 106.3 ?-Check anemia panel in the a.m. ?-Continue to monitor for signs and symptoms of bleeding; currently no overt bleeding noted ?-Repeat CBC in a.m. ? ?Mixed  hyperlipidemia ?-Continue Fenofibrate 54 mg po Daily  ? ?Paroxysmal atrial fibrillation (HCC) ?-Continue Bisoprolol 5 mg p.o. twice daily and Propafenone 225 mg po q8h ?-Continue to monitor on telemetry ?-Not currently on ant

## 2022-02-18 NOTE — Consult Note (Signed)
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 He does not wish to see a podiatrist as his wife has dementia and is wheelchair bound at home. They live with his stepdaughter who must take time off from work to take them to MD appointments and the toe is "not bothering him". ?I discuss shoes with a wide and high toe box. I ask him to ensure that when he does go to MD appointments that he removes his shoes and socks so that a provider can monitor this wound.  He agrees. ? ?Topical care while in house will be to wash the feet daily and twice daily apply a povidone iodine swabstick to the area and allow it to air dry. (No dressing). Heels are to be floated while in bed and a sacral foam placed for PI prevention. I have requested a pressure redistribution chair cushion for his use while OOB in the chair. ? ?[facility] nursing team will not follow, but will remain available to this patient, the nursing and medical teams.  Please re-consult if needed. ?Thanks, ?Maudie Flakes, MSN, RN, [place], [place], CWON-AP, [place]  ?Pager# [phone]  ? ? ? ?  ?

## 2022-02-18 NOTE — Care Management Important Message (Signed)
Important Message ? ?Patient Details  ?Name: Daniel Barker ?MRN: [id number] ?Date of Birth: Dec 19, 1945 ? ? ?Medicare Important Message Given:  Yes ? ? ? ? ?[name] ?02/18/2022, 4:10 PM ?

## 2022-02-19 ENCOUNTER — Inpatient Hospital Stay (HOSPITAL_COMMUNITY): Payer: Medicare Other

## 2022-02-19 DIAGNOSIS — J9621 Acute and chronic respiratory failure with hypoxia: Secondary | ICD-10-CM | POA: Diagnosis not present

## 2022-02-19 DIAGNOSIS — J441 Chronic obstructive pulmonary disease with (acute) exacerbation: Secondary | ICD-10-CM | POA: Diagnosis not present

## 2022-02-19 DIAGNOSIS — N179 Acute kidney failure, unspecified: Secondary | ICD-10-CM | POA: Diagnosis not present

## 2022-02-19 DIAGNOSIS — I5032 Chronic diastolic (congestive) heart failure: Secondary | ICD-10-CM | POA: Diagnosis not present

## 2022-02-19 LAB — COMPREHENSIVE METABOLIC PANEL
ALT: 14 U/L (ref 0–44)
AST: 12 U/L — ABNORMAL LOW (ref 15–41)
Albumin: 3 g/dL — ABNORMAL LOW (ref 3.5–5.0)
Alkaline Phosphatase: 46 U/L (ref 38–126)
Anion gap: 9 (ref 5–15)
BUN: 46 mg/dL — ABNORMAL HIGH (ref 8–23)
CO2: 40 mmol/L — ABNORMAL HIGH (ref 22–32)
Calcium: 8.9 mg/dL (ref 8.9–10.3)
Chloride: 94 mmol/L — ABNORMAL LOW (ref 98–111)
Creatinine, Ser: 1.89 mg/dL — ABNORMAL HIGH (ref 0.61–1.24)
GFR, Estimated: 37 mL/min — ABNORMAL LOW (ref >60)
Glucose, Bld: 169 mg/dL — ABNORMAL HIGH (ref 70–99)
Potassium: 3.6 mmol/L (ref 3.5–5.1)
Sodium: 143 mmol/L (ref 135–145)
Total Bilirubin: 0.6 mg/dL (ref 0.3–1.2)
Total Protein: 5.5 g/dL — ABNORMAL LOW (ref 6.5–8.1)

## 2022-02-19 LAB — CBC WITH DIFFERENTIAL/PLATELET
Abs Immature Granulocytes: 0.19 10*3/uL — ABNORMAL HIGH (ref 0.00–0.07)
Basophils Absolute: 0 10*3/uL (ref 0.0–0.1)
Basophils Relative: 0 %
Eosinophils Absolute: 0 10*3/uL (ref 0.0–0.5)
Eosinophils Relative: 0 %
HCT: 30.8 % — ABNORMAL LOW (ref 39.0–52.0)
Hemoglobin: 9.4 g/dL — ABNORMAL LOW (ref 13.0–17.0)
Immature Granulocytes: 2 %
Lymphocytes Relative: 4 %
Lymphs Abs: 0.3 10*3/uL — ABNORMAL LOW (ref 0.7–4.0)
MCH: 31.3 pg (ref 26.0–34.0)
MCHC: 30.5 g/dL (ref 30.0–36.0)
MCV: 102.7 fL — ABNORMAL HIGH (ref 80.0–100.0)
Monocytes Absolute: 0.2 10*3/uL (ref 0.1–1.0)
Monocytes Relative: 3 %
Neutro Abs: 7.2 10*3/uL (ref 1.7–7.7)
Neutrophils Relative %: 91 %
Platelets: 154 10*3/uL (ref 150–400)
RBC: 3 MIL/uL — ABNORMAL LOW (ref 4.22–5.81)
RDW: 15.5 % (ref 11.5–15.5)
WBC: 8 10*3/uL (ref 4.0–10.5)
nRBC: 0.3 % — ABNORMAL HIGH (ref 0.0–0.2)

## 2022-02-19 LAB — VITAMIN B12: Vitamin B-12: 258 pg/mL (ref 180–914)

## 2022-02-19 LAB — IRON AND TIBC
Iron: 100 ug/dL (ref 45–182)
Saturation Ratios: 32 % (ref 17.9–39.5)
TIBC: 312 ug/dL (ref 250–450)
UIBC: 212 ug/dL

## 2022-02-19 LAB — RETICULOCYTES
Immature Retic Fract: 13.5 % (ref 2.3–15.9)
RBC.: 2.93 MIL/uL — ABNORMAL LOW (ref 4.22–5.81)
Retic Count, Absolute: 76.8 10*3/uL (ref 19.0–186.0)
Retic Ct Pct: 2.6 % (ref 0.4–3.1)

## 2022-02-19 LAB — GLUCOSE, CAPILLARY
Glucose-Capillary: 150 mg/dL — ABNORMAL HIGH (ref 70–99)
Glucose-Capillary: 229 mg/dL — ABNORMAL HIGH (ref 70–99)
Glucose-Capillary: 235 mg/dL — ABNORMAL HIGH (ref 70–99)
Glucose-Capillary: 197 mg/dL — ABNORMAL HIGH (ref 70–99)

## 2022-02-19 LAB — MAGNESIUM: Magnesium: 2.3 mg/dL (ref 1.7–2.4)

## 2022-02-19 LAB — FERRITIN: Ferritin: 68 ng/mL (ref 24–336)

## 2022-02-19 LAB — PHOSPHORUS: Phosphorus: 3.6 mg/dL (ref 2.5–4.6)

## 2022-02-19 LAB — FOLATE: Folate: 6.9 ng/mL (ref >5.9)

## 2022-02-19 MED ORDER — IPRATROPIUM-ALBUTEROL 0.5-2.5 (3) MG/3ML IN SOLN
3.0000 mL | Freq: Three times a day (TID) | RESPIRATORY_TRACT | Status: DC
  Administered 2022-02-19 – 2022-02-21 (×8): 3 mL via RESPIRATORY_TRACT
  Filled 2022-02-19 (×8): qty 3

## 2022-02-19 MED ORDER — DOXYCYCLINE HYCLATE 100 MG PO TABS
100.0000 mg | ORAL_TABLET | Freq: Two times a day (BID) | ORAL | Status: DC
  Administered 2022-02-19 – 2022-02-23 (×9): 100 mg via ORAL
  Filled 2022-02-19 (×9): qty 1

## 2022-02-19 MED ORDER — BUDESONIDE 0.5 MG/2ML IN SUSP
0.5000 mg | Freq: Two times a day (BID) | RESPIRATORY_TRACT | Status: DC
  Administered 2022-02-19 – 2022-02-23 (×6): 0.5 mg via RESPIRATORY_TRACT
  Filled 2022-02-19 (×6): qty 2

## 2022-02-19 NOTE — Progress Notes (Signed)
Patient complaining of SOB, patient lying low in the bed, adjusted patient in bed. Oxygen saturation 97% at 4 liters. MD Alfredia Ferguson made aware. New orders placed.  ?

## 2022-02-19 NOTE — Progress Notes (Signed)
Patients family requested to speak with MD regarding patient. MD Alfredia Ferguson made aware.  ?

## 2022-02-19 NOTE — Progress Notes (Addendum)
?PROGRESS NOTE ? ? ? ?Daniel Barker  UMP:[id number] DOB: September 10, 1946 DOA: 02/15/2022 ?PCP: [name], MD  ? ?Brief Narrative:  ?HPI per Dr. Carlynn Purl on 02/16/22 ?HPI: Daniel Barker is a 76 y.o. male with medical history significant of atrial for relation, CKD, COPD, hyperlipidemia, hypertension, coronary artery disease with stent, and more presents to ED with a chief complaint of dyspnea.  Patient reports that he was fine when he went to sleep last night.  He got up to go to the bathroom several times during the night and also felt fine.  When he woke up this morning he was acutely short of breath.  It was worse with exertion.  He had no associated chest pain, palpitations.  But he did have cough and wheeze.  It is nonproductive.  Patient denies any fevers.  He has a nebulizer at home and was using it every 6 hours throughout the day.  At first it seemed to make no difference, some of the later treatments gave temporary relief.  Patient reports that he does wear 2 L nasal cannula at home, he did not try to increase the supplementation.  Patient also reports he heard a rattling in his lungs, but could not cough anything up.  Patient is under the impression that ever since he started elevating his legs his breathing is worse.  He does not have peripheral edema at the time of my exam.  He did have 900 cc output in the ED, and has wrinkled skin in the lower extremities, like there was peripheral edema there initially.  He has had any changes to his torsemide at home.  He does report that his blood pressure was dropping too low so one of his blood pressure medications was changed.  He is not sure which 1.  Patient also reports he feels a fullness in his abdomen.  Today he is comfortable, but sometimes it feels like fluid is collecting there.  Lastly patient complains of numbness in his feet that is chronic.  Patient has no other complaints at this time. ?  ?Patient does not smoke, has about 1 drink of alcohol  every few months, and does not use illicit drugs.  He has had 2 COVID vaccines and the flu vaccine.  Patient is full code. ? ?**Interim History ?Patient is slow to improve and feels about 70% better today and states he is able to cough up a little sputum.  He was given IV Solu-Medrol and will continue this today.  Adjustments have been made in his breathing treatments and we have stopped his inhaler and started Brovana and budesonide in addition to his other neb treatments.  We will continue his home torsemide and he will need an ambulatory home O2 screen prior to discharge.  Given his slow improvement we will discuss with pulmonary about further evaluation recommendations and patient does not wish to see Dr. [name] currently so will hold off formal consultation.  ? ?He has a toe ulcer noted on his second toe so have asked [facility] nurse to evaluate and they have recommended topical care. Foot X-Ray showed "Normal bone mineralization. Minimal great toe metatarsophalangeal lateral joint space narrowing and lateral peripheral degenerative osteophytes. Tiny well corticated chronic ossicle just distal to the fibula, likely the sequela of remote trauma. Small plantar calcaneal heel spur. No cortical  erosion. No subcutaneous air. No acute fracture or dislocation." ?We will work the patient up and get ABIs to ensure proper blood flow to the feet. ? ?  Patient continues to be intermittently dyspneic and had to be placed on 4 L supplemental oxygen.  This morning but was weaned back down.  I discussed with him about starting morphine for his dyspnea and he states that he will think about it.  PT OT to evaluate and treat  ? ? ?Assessment and Plan: ?* COPD exacerbation ([place]) ?-Triggered by Suspected pneumonia but PNA ruled out ?-Continue scheduled DuoNeb every 6 scheduled, as needed albuterol ?-Continue Steroids and received Solumedrol 125 mg x1 and will be continuing IV Solumedrol for now  ?-Continue Zithromax but stopped  Rocephin ?-Obtain Sputum Cx ?-Continued mometasone-formoterol 2 puffs IH twice daily but stopped and started him on Budesonide and Brovana ?-Continue with montelukast 10 mg p.o. daily and Fluticasone 50 mcg/ACT 1 spray each nare ?-We will add guaifenesin 1200 mg p.o. twice daily, incentive spirometry and flutter valve ?-Chest x-ray done and showed Bibasilar reticular and nodular densities, slightly progressed since the prior radiograph and may represent developing infiltrate. No focal consolidation, pleural effusion, pneumothorax. The cardiac silhouette is within limits. Atherosclerotic calcification of the aorta. Median sternotomy wires. No acute osseous pathology."  ?-Continue monitor respiratory status carefully and repeat chest x-ray in a.m. ?-We will discuss with Pulmonary about further evaluation recommendations but he does not want to see the Pulmonologist  ?-Given his continued dyspnea we may try some oral morphine for symptom relief ?  ? ?Toe ulcer ([place]) ?-Noted to have a Toe Ulcer on the 2nd toe of Left Foot ?-WOC Nurse Consult ?-Will order a Plain Film X-Ray and showed "Normal bone mineralization. Minimal great toe metatarsophalangeal lateral joint space narrowing and lateral peripheral degenerative osteophytes. Tiny well corticated chronic ossicle just distal to the fibula, likely the sequela of remote trauma. Small plantar calcaneal heel spur. No cortical erosion. No subcutaneous air. No acute fracture or dislocation." ?-Will order ABI's while hospitalized. Patient is not Diabetic but is Pre-Diabetic  ?-Consider further Imaging and Workup but patient is not complaining of any pain currently ?-Will need Podiatric Follow up at D/C  ? ?Leukocytosis ?-Worsened In the setting of Steroid Demargination ?-Patient's WBC went from 12.4 -> 10.3 -> 12.2 -> 9.6 -> 8.0 ?-Continue to Monitor and is on Abx ?-Repeat CBC in the AM  ? ?Acute kidney injury superimposed on chronic kidney disease ([place]) ?-Baseline Cr is Stage  3a or 3b but likely 3b ?-Patient's BUNs/creatinine was elevated and went from 38/1.96 -> 38/1.74 -> 43/2.03 -> 43/1.72 -> 46/1.89 ?-Continue with home diuretics and avoid further nephrotoxic medications, consciousness, hypotension and renally adjust medications ?-Discontinued IVF ?-Repeat CMP in a.m. ? ?Chronic diastolic CHF (congestive heart failure) ([place]) ?-Possibly contributing to his shortness of breath and worsening dyspnea on exertion; does not appear to be in acute exacerbation ?-Given IV Lasix 60 mg/1 and then resumed on his home diuretics.  Was getting IV fluid hydration but this has not been discontinued ?-Last echo showed an EF of 65 to 70% with grade 2 diastolic dysfunction ?-BNP was elevated at 409.0 but is improved from last check back in January of 568.0 ?-Resume home diuretics ?-Strict I's and O's and daily weights; Patient is -3.367 Liters ?-Continue to monitor respiratory status carefully and repeat chest x-ray in the a.m. ? ?Acute on chronic respiratory failure with hypoxia (HCC) ?-See COPD ?-May try some oral morphine for symptom relief ?-Palliative care has been consulted for further goals of care discussion ? ? ?Metabolic alkalosis ?-Patient has a bicarb of 40and now patient's BUNs/creatinine is 46/1.89 with an  anion gap of 9, chloride level 94 ?-This is after 900 cc urine output status post Lasix ?-Most likely secondary to hydrogen dumping into the urine ?-Patient may be slightly over diuresed as well ?-Was given Gentle IV hydration with NS at 50 mL/hr but was stopped and will resume his home Diuretics; Received IV Lasix on Admission ?-ABG was rechecked today and he continues have a slight metabolic alkalosis as his pH was 7.49 and has bicarbonate on ABG was 44.7.  -His bicarbonate on the CMP was 40 ?-Continue to Monitor and Trend ? ?CAP (community acquired pneumonia) ?-Initially suspected to have a CAP but No evidence of pneumonia on chest x-ray yesterday but has a slight increased  progression of his reticular densities; clinically I do not feel that he has a community-acquired pneumonia ?-Patient does have a leukocytosis, cough, dyspnea, and rhonchi on exam but this is likely related to COPD ?-[place]

## 2022-02-20 ENCOUNTER — Inpatient Hospital Stay (HOSPITAL_COMMUNITY): Payer: Medicare Other

## 2022-02-20 DIAGNOSIS — I5032 Chronic diastolic (congestive) heart failure: Secondary | ICD-10-CM | POA: Diagnosis not present

## 2022-02-20 DIAGNOSIS — N179 Acute kidney failure, unspecified: Secondary | ICD-10-CM | POA: Diagnosis not present

## 2022-02-20 DIAGNOSIS — J441 Chronic obstructive pulmonary disease with (acute) exacerbation: Secondary | ICD-10-CM | POA: Diagnosis not present

## 2022-02-20 DIAGNOSIS — J9621 Acute and chronic respiratory failure with hypoxia: Secondary | ICD-10-CM | POA: Diagnosis not present

## 2022-02-20 LAB — GLUCOSE, CAPILLARY
Glucose-Capillary: 241 mg/dL — ABNORMAL HIGH (ref 70–99)
Glucose-Capillary: 181 mg/dL — ABNORMAL HIGH (ref 70–99)
Glucose-Capillary: 162 mg/dL — ABNORMAL HIGH (ref 70–99)
Glucose-Capillary: 166 mg/dL — ABNORMAL HIGH (ref 70–99)

## 2022-02-20 LAB — CBC WITH DIFFERENTIAL/PLATELET
Abs Immature Granulocytes: 0.34 10*3/uL — ABNORMAL HIGH (ref 0.00–0.07)
Basophils Absolute: 0 10*3/uL (ref 0.0–0.1)
Basophils Relative: 0 %
Eosinophils Absolute: 0 10*3/uL (ref 0.0–0.5)
Eosinophils Relative: 0 %
HCT: 33.3 % — ABNORMAL LOW (ref 39.0–52.0)
Hemoglobin: 10.5 g/dL — ABNORMAL LOW (ref 13.0–17.0)
Immature Granulocytes: 4 %
Lymphocytes Relative: 4 %
Lymphs Abs: 0.3 10*3/uL — ABNORMAL LOW (ref 0.7–4.0)
MCH: 32.8 pg (ref 26.0–34.0)
MCHC: 31.5 g/dL (ref 30.0–36.0)
MCV: 104.1 fL — ABNORMAL HIGH (ref 80.0–100.0)
Monocytes Absolute: 0.2 10*3/uL (ref 0.1–1.0)
Monocytes Relative: 3 %
Neutro Abs: 7.1 10*3/uL (ref 1.7–7.7)
Neutrophils Relative %: 89 %
Platelets: 167 10*3/uL (ref 150–400)
RBC: 3.2 MIL/uL — ABNORMAL LOW (ref 4.22–5.81)
RDW: 15 % (ref 11.5–15.5)
WBC: 8 10*3/uL (ref 4.0–10.5)
nRBC: 0.3 % — ABNORMAL HIGH (ref 0.0–0.2)

## 2022-02-20 LAB — COMPREHENSIVE METABOLIC PANEL
ALT: 15 U/L (ref 0–44)
AST: 13 U/L — ABNORMAL LOW (ref 15–41)
Albumin: 3.1 g/dL — ABNORMAL LOW (ref 3.5–5.0)
Alkaline Phosphatase: 47 U/L (ref 38–126)
Anion gap: 9 (ref 5–15)
BUN: 50 mg/dL — ABNORMAL HIGH (ref 8–23)
CO2: 39 mmol/L — ABNORMAL HIGH (ref 22–32)
Calcium: 8.8 mg/dL — ABNORMAL LOW (ref 8.9–10.3)
Chloride: 95 mmol/L — ABNORMAL LOW (ref 98–111)
Creatinine, Ser: 1.89 mg/dL — ABNORMAL HIGH (ref 0.61–1.24)
GFR, Estimated: 37 mL/min — ABNORMAL LOW (ref >60)
Glucose, Bld: 266 mg/dL — ABNORMAL HIGH (ref 70–99)
Potassium: 3.3 mmol/L — ABNORMAL LOW (ref 3.5–5.1)
Sodium: 143 mmol/L (ref 135–145)
Total Bilirubin: 0.6 mg/dL (ref 0.3–1.2)
Total Protein: 5.8 g/dL — ABNORMAL LOW (ref 6.5–8.1)

## 2022-02-20 LAB — PHOSPHORUS: Phosphorus: 3.4 mg/dL (ref 2.5–4.6)

## 2022-02-20 LAB — MAGNESIUM: Magnesium: 2.5 mg/dL — ABNORMAL HIGH (ref 1.7–2.4)

## 2022-02-20 MED ORDER — MORPHINE SULFATE (CONCENTRATE) 10 MG/0.5ML PO SOLN
5.0000 mg | ORAL | Status: DC | PRN
  Administered 2022-02-21 – 2022-02-23 (×4): 5 mg via ORAL
  Filled 2022-02-20 (×4): qty 0.5

## 2022-02-20 NOTE — Progress Notes (Signed)
?PROGRESS NOTE ? ? ? ?Daniel Barker  SWN:[id number] DOB: 06-Feb-1946 DOA: 02/15/2022 ?PCP: [name], MD  ? ?Brief Narrative:  ?HPI per Dr. Carlynn Purl on 02/16/22 ?HPI: Daniel Barker is a 76 y.o. male with medical history significant of atrial for relation, CKD, COPD, hyperlipidemia, hypertension, coronary artery disease with stent, and more presents to ED with a chief complaint of dyspnea.  Patient reports that he was fine when he went to sleep last night.  He got up to go to the bathroom several times during the night and also felt fine.  When he woke up this morning he was acutely short of breath.  It was worse with exertion.  He had no associated chest pain, palpitations.  But he did have cough and wheeze.  It is nonproductive.  Patient denies any fevers.  He has a nebulizer at home and was using it every 6 hours throughout the day.  At first it seemed to make no difference, some of the later treatments gave temporary relief.  Patient reports that he does wear 2 L nasal cannula at home, he did not try to increase the supplementation.  Patient also reports he heard a rattling in his lungs, but could not cough anything up.  Patient is under the impression that ever since he started elevating his legs his breathing is worse.  He does not have peripheral edema at the time of my exam.  He did have 900 cc output in the ED, and has wrinkled skin in the lower extremities, like there was peripheral edema there initially.  He has had any changes to his torsemide at home.  He does report that his blood pressure was dropping too low so one of his blood pressure medications was changed.  He is not sure which 1.  Patient also reports he feels a fullness in his abdomen.  Today he is comfortable, but sometimes it feels like fluid is collecting there.  Lastly patient complains of numbness in his feet that is chronic.  Patient has no other complaints at this time. ?  ?Patient does not smoke, has about 1 drink of alcohol  every few months, and does not use illicit drugs.  He has had 2 COVID vaccines and the flu vaccine.  Patient is full code. ? ?**Interim History ?Patient is slow to improve and feels about 70% better today and states he is able to cough up a little sputum.  He was given IV Solu-Medrol and will continue this today.  Adjustments have been made in his breathing treatments and we have stopped his inhaler and started Brovana and budesonide in addition to his other neb treatments.  We will continue his home torsemide and he will need an ambulatory home O2 screen prior to discharge.  Given his slow improvement we will discuss with pulmonary about further evaluation recommendations and patient does not wish to see Dr. [name] currently so will hold off formal consultation.  ? ?He has a toe ulcer noted on his second toe so have asked [facility] nurse to evaluate and they have recommended topical care. Foot X-Ray showed "Normal bone mineralization. Minimal great toe metatarsophalangeal lateral joint space narrowing and lateral peripheral degenerative osteophytes. Tiny well corticated chronic ossicle just distal to the fibula, likely the sequela of remote trauma. Small plantar calcaneal heel spur. No cortical  erosion. No subcutaneous air. No acute fracture or dislocation." ?We will work the patient up and get ABIs to ensure proper blood flow to the feet. ? ?  Patient continues to be intermittently dyspneic and had to be placed on 4 L supplemental oxygen.  This morning but was weaned back down.  I discussed with him about starting morphine for his dyspnea and he is agreeable.  PT OT did recommend home health.  Pulmonary to see the patient in the morning and palliative to have further goals of care discussion.  Patient is slowly improving.  ? ? ?Assessment and Plan: ?* COPD exacerbation ([place]) ?-Triggered by Suspected pneumonia but PNA ruled out ?-Continue scheduled DuoNeb every 6 scheduled, as needed albuterol ?-Continue Steroids and  received Solumedrol 125 mg x1 and will be continuing IV Solumedrol for now  ?-Continue Zithromax but stopped Rocephin ?-Obtain Sputum Cx ?-Continued mometasone-formoterol 2 puffs IH twice daily but stopped and started him on Budesonide and Brovana ?-Continue with montelukast 10 mg p.o. daily and Fluticasone 50 mcg/ACT 1 spray each nare ?-We will add guaifenesin 1200 mg p.o. twice daily, incentive spirometry and flutter valve ?-Chest x-ray done and showed Bibasilar reticular and nodular densities, slightly progressed since the prior radiograph and may represent developing infiltrate. No focal consolidation, pleural effusion, pneumothorax. The cardiac silhouette is within limits. Atherosclerotic calcification of the aorta. Median sternotomy wires. No acute osseous pathology."  ?-Continue monitor respiratory status carefully and repeat chest x-ray in a.m. ?-We will discuss with Pulmonary about further evaluation recommendations but he does not want to see the Pulmonologist  ?-Given his continued dyspnea we may try some oral morphine for symptom relief and he is agreeable so started 5 mg oral q4hprn  ?  ? ?Toe ulcer ([place]) ?-Noted to have a Toe Ulcer on the 2nd toe of Left Foot ?-[facility] Nurse Consult ?-Will order a Plain Film X-Ray and showed "Normal bone mineralization. Minimal great toe metatarsophalangeal lateral joint space narrowing and lateral peripheral degenerative osteophytes. Tiny well corticated chronic ossicle just distal to the fibula, likely the sequela of remote trauma. Small plantar calcaneal heel spur. No cortical erosion. No subcutaneous air. No acute fracture or dislocation." ?-Will order ABI's while hospitalized and pending. Patient is not Diabetic but is Pre-Diabetic  ?-Consider further Imaging and Workup but patient is not complaining of any pain currently ?-Will need Podiatric Follow up at D/C  ? ?Leukocytosis ?-Worsened In the setting of Steroid Demargination ?-Patient's WBC went from 12.4 -> 10.3 ->  12.2 -> 9.6 -> 8.0 x2 ?-Continue to Monitor and is on Abx ?-Repeat CBC in the AM  ? 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 ? ?Chronic diastolic CHF (congestive heart failure) ([place]) ?-Possibly contributing to his shortness of breath and worsening dyspnea on exertion; does not appear to be in acute exacerbation ?-Given IV Lasix 60 mg/1 and then resumed on his home diuretics.  Was getting IV fluid hydration but this has not been discontinued ?-Last echo showed an EF of 65 to 70% with grade 2 diastolic dysfunction ?-BNP was elevated at 409.0 but is improved from last check back in January of 568.0 ?-Resume home diuretics ?-Strict I's and O's and daily weights; Patient is -4.337 Liters ?-Continue to monitor respiratory status carefully and repeat chest x-ray in the a.m. ? ?Acute on chronic respiratory failure with hypoxia ([facility]) ?-See COPD ?-May try  some oral morphine for symptom relief ?-Palliative care has been consulted for further goals of care discussion ? ? ?Metabolic alkalosis ?-Patient has a bicarb of 40and now patient's BUNs/creatinine is 46/1.89 with an anion gap of 9, chloride level 94 ?-This is after 900 cc urine output status post Lasix ?-Most likely secondary to hydrogen dumping into the urine ?-Patient may be slightly over diuresed as well ?-Was given Gentle IV hydration with NS at 50 mL/hr but was stopped and will resume his home Diuretics; Received IV Lasix on Admission  ?-ABG was rechecked today and he continues have a slight metabolic alkalosis as his pH was 7.49 and has bicarbonate on ABG was 44.7.  -His bicarbonate on the CMP was 40 ?-Continue  to Monitor and Trend ? ?CAP (community acquired pneumonia) ?-Initially suspected to have a CAP but No evidence of pneumonia on chest x-ray yesterday but has a slight increased progression of his reticular d

## 2022-02-20 NOTE — Plan of Care (Signed)
?  Problem: Acute Rehab PT Goals(only PT should resolve) ?Goal: Pt Will Go Supine/Side To Sit ?Outcome: Progressing ?Flowsheets (Taken 02/20/2022 1134) ?Pt will go Supine/Side to Sit: ? with modified independence ? Independently ?Goal: Patient Will Transfer Sit To/From Stand ?Outcome: Progressing ?Flowsheets (Taken 02/20/2022 1134) ?Patient will transfer sit to/from stand: ? with modified independence ? with supervision ?Goal: Pt Will Transfer Bed To Chair/Chair To Bed ?Outcome: Progressing ?Flowsheets (Taken 02/20/2022 1134) ?Pt will Transfer Bed to Chair/Chair to Bed: ? with modified independence ? with supervision ?Goal: Pt Will Ambulate ?Outcome: Progressing ?Flowsheets (Taken 02/20/2022 1134) ?Pt will Ambulate: ? 50 feet ? with supervision ? with modified independence ? with rolling walker ?  ?11:34 AM, 02/20/22 ?Lonell Grandchild, MPT ?Physical Therapist with [place] ?[facility] ?[phone] office ?[phone] mobile phone ? ?

## 2022-02-20 NOTE — Evaluation (Signed)
Physical Therapy Evaluation Patient Details Name: Daniel Barker MRN: [id number] DOB: [birth year] Today's Date: 02/20/2022  History of Present Illness  Daniel Barker is a 76 y.o. male with medical history significant of atrial for relation, CKD, COPD, hyperlipidemia, hypertension, coronary artery disease with stent, and more presents to ED with a chief complaint of dyspnea.  Patient reports that he was fine when he went to sleep last night.  He got up to go to the bathroom several times during the night and also felt fine.  When he woke up this morning he was acutely short of breath.  It was worse with exertion.  He had no associated chest pain, palpitations.  But he did have cough and wheeze.  It is nonproductive.  Patient denies any fevers.  He has a nebulizer at home and was using it every 6 hours throughout the day.  At first it seemed to make no difference, some of the later treatments gave temporary relief.  Patient reports that he does wear 2 L nasal cannula at home, he did not try to increase the supplementation.  Patient also reports he heard a rattling in his lungs, but could not cough anything up.  Patient is under the impression that ever since he started elevating his legs his breathing is worse.  He does not have peripheral edema at the time of my exam.  He did have 900 cc output in the ED, and has wrinkled skin in the lower extremities, like there was peripheral edema there initially.  He has had any changes to his torsemide at home.  He does report that his blood pressure was dropping too low so one of his blood pressure medications was changed.  He is not sure which 1.  Patient also reports he feels a fullness in his abdomen.  Today he is comfortable, but sometimes it feels like fluid is collecting there.  Lastly patient complains of numbness in his feet that is chronic.  Patient has no other complaints at this time.   Clinical Impression  Patient functioning near baseline for functional  mobility and gait demonstrating fair/good return for ambulating in room without loss of balance while on 4 LPM O2 with SpO2 at 92%.  Patient tolerated sitting up in chair with SpO2 at 100% while on 2 LPM - nursing staff notified.  Patient will benefit from continued skilled physical therapy in hospital and recommended venue below to increase strength, balance, endurance for safe ADLs and gait.         Recommendations for follow up therapy are one component of a multi-disciplinary discharge planning process, led by the attending physician.  Recommendations may be updated based on patient status, additional functional criteria and insurance authorization.  Follow Up Recommendations Home health PT    Assistance Recommended at Discharge PRN  Patient can return home with the following  A little help with walking and/or transfers;A little help with bathing/dressing/bathroom;Help with stairs or ramp for entrance;Assistance with cooking/housework    Equipment Recommendations None recommended by PT  Recommendations for Other Services       Functional Status Assessment Patient has had a recent decline in their functional status and demonstrates the ability to make significant improvements in function in a reasonable and predictable amount of time.     Precautions / Restrictions Precautions Precautions: Fall Restrictions Weight Bearing Restrictions: No      Mobility  Bed Mobility Overal bed mobility: Modified Independent  General bed mobility comments: HOB slightly raised    Transfers Overall transfer level: Needs assistance Equipment used: Rolling walker (2 wheels) Transfers: Sit to/from Stand, Bed to chair/wheelchair/BSC Sit to Stand: Supervision   Step pivot transfers: Supervision       General transfer comment: slightly labored movement, increased time    Ambulation/Gait Ambulation/Gait assistance: Supervision, Min guard Gait Distance (Feet): 40  Feet Assistive device: Rolling walker (2 wheels) Gait Pattern/deviations: Decreased step length - right, Decreased step length - left, Decreased stride length Gait velocity: decreased     General Gait Details: slightly labored cadence walking in room up to doorway and back to bedside x 2 trials before having to sit due to fatigue, on 4 LPM with SpO2 at 92%  Stairs            Wheelchair Mobility    Modified Rankin (Stroke Patients Only)       Balance Overall balance assessment: Needs assistance Sitting-balance support: Feet supported, No upper extremity supported Sitting balance-Leahy Scale: Good Sitting balance - Comments: seated at EOB   Standing balance support: During functional activity, Bilateral upper extremity supported Standing balance-Leahy Scale: Fair Standing balance comment: fair/good using RW                             Pertinent Vitals/Pain Pain Assessment Pain Assessment: No/denies pain    Home Living Family/patient expects to be discharged to:: Private residence Living Arrangements: Spouse/significant other Available Help at Discharge: Family;Available PRN/intermittently Type of Home: House Home Access: Stairs to enter Entrance Stairs-Rails: Right;Left;Can reach both Entrance Stairs-Number of Steps: 3   Home Layout: One level Home Equipment: Agricultural consultant (2 wheels);Cane - single point;BSC/3in1;Grab bars - tub/shower;Shower seat      Prior Function Prior Level of Function : Independent/Modified Independent             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 RW ADLs Comments: assisted by family and home aides     Hand Dominance   Dominant Hand: Left    Extremity/Trunk Assessment   Upper Extremity Assessment Upper Extremity Assessment: Defer to OT evaluation    Lower Extremity Assessment Lower Extremity Assessment: Generalized weakness    Cervical / Trunk Assessment Cervical / Trunk Assessment: Normal   Communication   Communication: HOH  Cognition Arousal/Alertness: Awake/alert Behavior During Therapy: WFL for tasks assessed/performed Overall Cognitive Status: Within Functional Limits for tasks assessed                                          General Comments      Exercises     Assessment/Plan    PT Assessment Patient needs continued PT services  PT Problem List Decreased strength;Decreased activity tolerance;Decreased balance;Decreased mobility       PT Treatment Interventions DME instruction;Gait training;Stair training;Functional mobility training;Therapeutic activities;Therapeutic exercise;Patient/family education;Balance training    PT Goals (Current goals can be found in the Care Plan section)  Acute Rehab PT Goals Patient Stated Goal: return home with home aides and family to assist PT Goal Formulation: With patient Time For Goal Achievement: 02/24/22 Potential to Achieve Goals: Good    Frequency Min 3X/week     Co-evaluation               AM-PAC PT "6 Clicks" Mobility  Outcome Measure Help needed turning from your back to  your side while in a flat bed without using bedrails?: None Help needed moving from lying on your back to sitting on the side of a flat bed without using bedrails?: None Help needed moving to and from a bed to a chair (including a wheelchair)?: A Little Help needed standing up from a chair using your arms (e.g., wheelchair or bedside chair)?: A Little Help needed to walk in hospital room?: A Little Help needed climbing 3-5 steps with a railing? : A Lot 6 Click Score: 19    End of Session Equipment Utilized During Treatment: Oxygen Activity Tolerance: Patient tolerated treatment well;Patient limited by fatigue Patient left: in chair;with call bell/phone within reach Nurse Communication: Mobility status PT Visit Diagnosis: Unsteadiness on feet (R26.81);Other abnormalities of gait and mobility (R26.89);Muscle  weakness (generalized) (M62.81)    Time: 0940-1001 PT Time Calculation (min) (ACUTE ONLY): 21 min   Charges:   PT Evaluation $PT Eval Moderate Complexity: 1 Mod PT Treatments $Therapeutic Activity: 8-22 mins        11:33 AM, 02/20/22 Ocie Bob, MPT Physical Therapist with [facility] 336 [phone] office [phone] mobile phone

## 2022-02-20 NOTE — Assessment & Plan Note (Addendum)
-  Patient's K+ is now improved to 4.0; Mag Level is 2.4 ?-Continue to Monitor and Replete as Necessary ?-Repeat CMP in the AM  ?

## 2022-02-21 ENCOUNTER — Inpatient Hospital Stay (HOSPITAL_COMMUNITY): Payer: Medicare Other

## 2022-02-21 ENCOUNTER — Encounter (HOSPITAL_COMMUNITY): Payer: Self-pay | Admitting: Internal Medicine

## 2022-02-21 DIAGNOSIS — J9621 Acute and chronic respiratory failure with hypoxia: Secondary | ICD-10-CM

## 2022-02-21 DIAGNOSIS — J441 Chronic obstructive pulmonary disease with (acute) exacerbation: Principal | ICD-10-CM

## 2022-02-21 DIAGNOSIS — Z7189 Other specified counseling: Secondary | ICD-10-CM

## 2022-02-21 DIAGNOSIS — J9622 Acute and chronic respiratory failure with hypercapnia: Secondary | ICD-10-CM

## 2022-02-21 LAB — COMPREHENSIVE METABOLIC PANEL
ALT: 16 U/L (ref 0–44)
AST: 15 U/L (ref 15–41)
Albumin: 3.1 g/dL — ABNORMAL LOW (ref 3.5–5.0)
Alkaline Phosphatase: 42 U/L (ref 38–126)
Anion gap: 8 (ref 5–15)
BUN: 58 mg/dL — ABNORMAL HIGH (ref 8–23)
CO2: 41 mmol/L — ABNORMAL HIGH (ref 22–32)
Calcium: 8.9 mg/dL (ref 8.9–10.3)
Chloride: 94 mmol/L — ABNORMAL LOW (ref 98–111)
Creatinine, Ser: 1.82 mg/dL — ABNORMAL HIGH (ref 0.61–1.24)
GFR, Estimated: 38 mL/min — ABNORMAL LOW (ref >60)
Glucose, Bld: 191 mg/dL — ABNORMAL HIGH (ref 70–99)
Potassium: 4.2 mmol/L (ref 3.5–5.1)
Sodium: 143 mmol/L (ref 135–145)
Total Bilirubin: 0.6 mg/dL (ref 0.3–1.2)
Total Protein: 5.7 g/dL — ABNORMAL LOW (ref 6.5–8.1)

## 2022-02-21 LAB — CBC WITH DIFFERENTIAL/PLATELET
Abs Immature Granulocytes: 0.42 10*3/uL — ABNORMAL HIGH (ref 0.00–0.07)
Basophils Absolute: 0 10*3/uL (ref 0.0–0.1)
Basophils Relative: 0 %
Eosinophils Absolute: 0 10*3/uL (ref 0.0–0.5)
Eosinophils Relative: 0 %
HCT: 33.9 % — ABNORMAL LOW (ref 39.0–52.0)
Hemoglobin: 10.2 g/dL — ABNORMAL LOW (ref 13.0–17.0)
Immature Granulocytes: 4 %
Lymphocytes Relative: 3 %
Lymphs Abs: 0.4 10*3/uL — ABNORMAL LOW (ref 0.7–4.0)
MCH: 30.6 pg (ref 26.0–34.0)
MCHC: 30.1 g/dL (ref 30.0–36.0)
MCV: 101.8 fL — ABNORMAL HIGH (ref 80.0–100.0)
Monocytes Absolute: 0.4 10*3/uL (ref 0.1–1.0)
Monocytes Relative: 3 %
Neutro Abs: 9.5 10*3/uL — ABNORMAL HIGH (ref 1.7–7.7)
Neutrophils Relative %: 90 %
Platelets: 178 10*3/uL (ref 150–400)
RBC: 3.33 MIL/uL — ABNORMAL LOW (ref 4.22–5.81)
RDW: 15 % (ref 11.5–15.5)
WBC: 10.6 10*3/uL — ABNORMAL HIGH (ref 4.0–10.5)
nRBC: 0.4 % — ABNORMAL HIGH (ref 0.0–0.2)

## 2022-02-21 LAB — GLUCOSE, CAPILLARY
Glucose-Capillary: 156 mg/dL — ABNORMAL HIGH (ref 70–99)
Glucose-Capillary: 131 mg/dL — ABNORMAL HIGH (ref 70–99)
Glucose-Capillary: 182 mg/dL — ABNORMAL HIGH (ref 70–99)
Glucose-Capillary: 148 mg/dL — ABNORMAL HIGH (ref 70–99)

## 2022-02-21 LAB — MAGNESIUM: Magnesium: 2.5 mg/dL — ABNORMAL HIGH (ref 1.7–2.4)

## 2022-02-21 LAB — PHOSPHORUS: Phosphorus: 3.2 mg/dL (ref 2.5–4.6)

## 2022-02-21 MED ORDER — REVEFENACIN 175 MCG/3ML IN SOLN: 175.0000 ug | Freq: Every day | RESPIRATORY_TRACT | Status: DC

## 2022-02-21 MED ORDER — REVEFENACIN 175 MCG/3ML IN SOLN
175.0000 ug | Freq: Every day | RESPIRATORY_TRACT | Status: DC
  Administered 2022-02-22 – 2022-02-23 (×2): 175 ug via RESPIRATORY_TRACT
  Filled 2022-02-21 (×2): qty 3

## 2022-02-21 NOTE — Consult Note (Signed)
[facility] ? ? ?Patient name: Daniel Barker Admit date: 02/15/2022  ?DOB: 04/20/1946 LOS: 5  ?MRN: [id number] Consult date: 02/21/2022  ?Referring provider: Dr. Alfredia Barker  CC: Short of breath  ? ? ?History:  ?76 yo male former smoker with hx of COPD and respiratory failure on 2 liters presented to [facility] ER with dyspnea, cough and wheeze.  SpO2 65% on room air.  Started on supplemental oxygen, steroids, and neb treatment for COPD exacerbation.   ? ?Past medical history:  ?A fib, CKD 3a, COPD, HLD, HTN, CAD s/p stent, BPH ? ?Significant events:  ?3/21 admit ?3/27 palliative care consulted ? ?Studies:  ?PFT 08/25/20 >> FEV1 0.33 (12%), FEV1% 30, DLCO 30% ?CT angio chest 04/05/21 >> diffuse emphysema, 5 mm nodule RUL ?Echo 10/28/21 >> EF 65 to 70%, grade 2 DD, RVSP 32.2 mmHg ? ?Micro:  ?COVID/Flu 3/25 >> negative ?Sputum 3/25 >>  ? Lines:  ? ?  ?Antibiotics:  ?Rocephin 3/21 >> 3/22 ?Zithromax 3/21 >> 3/24 ?Doxycycline 3/25 >>  ? Consults:  ?Palliative care ?  ? ?Interim history:  ?His father had COPD and black lung from working in [employer].  He remember his father during his last days struggling to breathing, and his father asked to help end his suffering.  That image has haunted him, and he is afraid he will end up like that.   ? ?He does feels his breathing has improved some since he has been in hospital, especially after he received morphine.  He still has a cough with clear sputum.  Not having chest pain, or leg swelling. ? ?Vital signs:  ?BP 134/61 (BP Location: Right Arm)   Pulse (!) 56   Temp 97.7 ?F (36.5 ?C) (Oral)   Resp 17   Ht 5' 6.5" (1.689 m)   Wt 72.7 kg   SpO2 100%   BMI 25.48 kg/m?  ? Intake/output:  ?I/O last 3 completed shifts: ?In: 720 [P.O.:720] ?Out: 2750 [Urine:2750] ?  ?Physical exam:  ? ?General - alert, sitting in chair, wearing oxygen ?Eyes - pupils reactive ?ENT - no sinus tenderness, no stridor ?Cardiac - regular  rate/rhythm, no murmur ?Chest - barrel chest, decreased breath sounds, no wheeze ?Abdomen - soft, non tender, + bowel sounds ?Extremities - no cyanosis, clubbing, or edema ?Skin - no rashes ?Neuro - normal strength, moves extremities, follows commands ?Psych - normal mood and behavior ? Best practice:  ? ?DVT - SQ heparin ?SUP - Protonix ?Nutrition - heart healthy  ? ?Assessment/plan:  ? ?Acute on chronic hypoxia/hypercapnic respiratory failure from COPD exacerbation. ?Very severe COPD with emphysema. ?- continue solumedrol ?- change to brovana, pulmicort, yupelri while in hospital ?- prn albuterol ?- Abx per primary team ?- adjust oxygen to keep SpO2 88 to 95% ?- prn morphine for dyspnea ? ?Anemia of chronic disease. ?CKD 3a. 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 ?Toe ulcer. ?Chronic diastolic CHF. ?CAD s/p CABG. ?- per primary team ? ? ?Resolved hospital problems:  ? ? ?Goals of care/Family discussions:  ?Code status: full code ? ?I had length discussion with [name] at bedside and then spoke with his step daughter, Daniel Barker, by phone.  Explained that he has end stage COPD with emphysema and he essentially has no reserve with his lung function and don't anticipate he can improve much beyond where his symptoms are at present.  He understands this and emphasized he doesn't want to end up like his father.  We discussed  setting limits on care, and focusing on therapies that will help manage his symptoms.  We also discussed the role of hospice care and that he likely would be eligible at this point.  He would like to continue this conversation with his step daughter.  His wife has dementia and likely wouldn't be able to participate in the conversation.  He has a son, but is estranged from him.  After discussing with Daniel Barker, he would like to continue the conversation with palliative care team. ? ?Labs:  ? ? ?  Latest Ref Rng & Units 02/21/2022  ?  5:23 AM 02/20/2022  ?  5:10 AM 02/19/2022  ?  5:15 AM  ?CMP   ?Glucose 70 - 99 mg/dL 191   266   169    ?BUN 8 - 23 mg/dL 58   50   46    ?Creatinine 0.61 - 1.24 mg/dL 1.82   1.89   1.89    ?Sodium 135 - 145 mmol/L 143   143   143    ?Potassium 3.5 - 5.1 mmol/L 4.2   3.3   3.6    ?Chloride 98 - 111 mmol/L 94   95   94    ?CO2 22 - 32 mmol/L 41   39   40    ?Calcium 8.9 - 10.3 mg/dL 8.9   8.8   8.9    ?Total Protein 6.5 - 8.1 g/dL 5.7   5.8   5.5    ?Total Bilirubin 0.3 - 1.2 mg/dL 0.6   0.6   0.6    ?Alkaline Phos 38 - 126 U/L 42   47   46    ?AST 15 - 41 U/L 15   13   12     ?ALT 0 - 44 U/L 16   15   14     ? ? ? ?  Latest Ref Rng & Units 02/21/2022  ?  5:23 AM 02/20/2022  ?  5:10 AM 02/19/2022  ?  5:15 AM  ?CBC  ?WBC 4.0 - 10.5 K/uL 10.6   8.0   8.0    ?Hemoglobin 13.0 - 17.0 g/dL 10.2   10.5   9.4    ?Hematocrit 39.0 - 52.0 % 33.9   33.3   30.8    ?Platelets 150 - 400 K/uL 178   167   154    ? ? ?ABG ?   ?Component Value Date/Time  ? PHART 7.49 (H) 02/17/2022 1353  ? PCO2ART 58 (H) 02/17/2022 1353  ? PO2ART 85 02/17/2022 1353  ? HCO3 44.7 (H) 02/17/2022 1353  ? O2SAT 98.5 02/17/2022 1353  ? ? ?CBG (last 3)  ?Recent Labs  ?  02/20/22 ?2109 02/21/22 ?0728 02/21/22 ?1120  ?GLUCAP 166* 156* 131*  ? ? ? ?Past surgical history:  ?He  has a past surgical history that includes Cardiac surgery and Coronary artery bypass graft. ? Social history:  ?He  reports that he quit smoking about 17 months ago. His smoking use included cigarettes. He has a 60.00 pack-year smoking history. He has never used smokeless tobacco. He reports that he does not drink alcohol and does not use drugs. ?  ?Review of systems:  ?Reviewed and negative ? Family history:  ?His family history includes COPD in his father; Clotting disorder in his mother; Hypertension in his mother. ?  ? ?Medications:  ? ?No current facility-administered medications on file prior to encounter.  ? ?Current Outpatient Medications on File Prior to Encounter  ?  Medication Sig  ? albuterol (PROVENTIL) (2.5 MG/3ML) 0.083% nebulizer solution  Take 3 mLs (2.5 mg total) by nebulization every 6 (six) hours as needed for wheezing or shortness of breath. DX: J44.9.  ? albuterol (VENTOLIN HFA) 108 (90 Base) MCG/ACT inhaler Inhale 2 puffs into the lungs every 4 (four) hours as needed for wheezing or shortness of breath.  ? amLODipine (NORVASC) 5 MG tablet Take 1 tablet (5 mg total) by mouth daily.  ? bisoprolol (ZEBETA) 5 MG tablet TAKE 1 TABLET(5 MG) BY MOUTH TWICE DAILY (Patient taking differently: Take 5 mg by mouth 2 (two) times daily.)  ? budesonide-formoterol (SYMBICORT) 160-4.5 MCG/ACT inhaler USE 2 PUFFS BY MOUTH EVERY MORNING AND 2 PUFFS EVERY EVENING  ? clopidogrel (PLAVIX) 75 MG tablet Take 1 tablet (75 mg total) by mouth daily.  ? fenofibrate micronized (LOFIBRA) 134 MG capsule Take 134 mg by mouth daily before breakfast.  ? fluticasone (FLONASE) 50 MCG/ACT nasal spray Place 1 spray into both nostrils daily.  ? montelukast (SINGULAIR) 10 MG tablet Take 10 mg by mouth daily.  ? pantoprazole (PROTONIX) 40 MG tablet Take 1 tablet (40 mg total) by mouth 2 (two) times daily.  ? polyethylene glycol powder (GLYCOLAX/MIRALAX) 17 GM/SCOOP powder Take 1 Container by mouth in the morning and at bedtime.  ? propafenone (RYTHMOL) 225 MG tablet TAKE 1 TABLET(225 MG) BY MOUTH EVERY 8 HOURS (Patient taking differently: Take 225 mg by mouth every 8 (eight) hours.)  ? tamsulosin (FLOMAX) 0.4 MG CAPS capsule Take 1 capsule (0.4 mg total) by mouth daily after supper.  ? Tiotropium Bromide Monohydrate (SPIRIVA RESPIMAT) 2.5 MCG/ACT AERS Inhale 2 puffs into the lungs daily.  ? torsemide (DEMADEX) 20 MG tablet Take 1 tablet (20 mg total) by mouth See admin instructions. Take 1 Tablet 20 mg qam and half tab (10 mg) every evening  ? zinc sulfate 220 (50 Zn) MG capsule Take 1 capsule (220 mg total) by mouth daily. (Patient taking differently: Take 50 mg by mouth daily.)  ? acetaminophen (TYLENOL) 500 MG tablet Take 1,000 mg by mouth every 6 (six) hours as needed for mild  pain.  ? amoxicillin-clavulanate (AUGMENTIN) 875-125 MG tablet Take 1 tablet by mouth 2 (two) times daily. (Patient not taking: Reported on 02/15/2022)  ? Nebulizers (COMPRESSOR/NEBULIZER) MISC Inhale 1 Units into the lungs

## 2022-02-21 NOTE — Consult Note (Signed)
? ?                                                                                ?Consultation Note ?Date: 02/21/2022  ? ?Patient Name: Daniel Barker  ?DOB: 24-Feb-1946  MRN: [id number]  Age / Sex: 76 y.o., male  ?PCP: [name], MD ?Referring Physician: Kerney Elbe, DO ? ?Reason for Consultation: Establishing goals of care ? ?HPI/Patient Profile: 76 y.o. male  with past medical history of A-fib, CKD, COPD, HTN/HLD, CAD with stenting/history of CABG, BPH admitted on 02/15/2022 with COPD exacerbation, pneumonia rule out, toe ulcer second toe of left foot.  ? ?Clinical Assessment and Goals of Care: ?I have reviewed medical records including EPIC notes, labs and imaging, received report from RN, assessed the patient.  [name] is sitting up in the [place] chair in his room.  He appears acutely/chronically ill and quite frail.  He will briefly make but not keep eye contact.  He is alert and oriented x3, able to make his basic needs known.  There is no family at bedside at this time.   ? ?We meet at bedside to discuss diagnosis prognosis, GOC, EOL wishes, disposition and options.  [name] was seen by 2 palliative providers fall 2022.  I introduced Palliative Medicine as specialized medical care for people living with serious illness. It focuses on providing relief from the symptoms and stress of a serious illness. The goal is to improve quality of life for both the patient and the family. ? ?We discussed a brief life review of the patient.  [name] lives in his stepdaughter, [name], home.  He shares that his wife has dementia.  He used to work as an Radiographer, therapeutic.  They have an aide from 10-4 daily to help care for his wife. ? ?We then focused on their current illness.  We talk about his respiratory status and the treatment plan.  We talked about the use of morphine for breathlessness.  Today is his first trial of morphine for breathlessness.  The natural disease trajectory and expectations  at EOL were discussed. ? ?Advanced directives, concepts specific to code status, were considered and discussed.  I shared that these difficult conversations are held out of respect for him, what he does and does not want.  [name] states that he would want CPR and life support if he is "not going to be a burden".  He shares that he is not sure how long he would except life support, if he would accept a tracheostomy or not.  I encouraged him to continue these difficult decisions with his healthcare surrogate, stepdaughter Daniel Barker.  I also encouraged him to continue goals of care discussion with outpatient palliative provider. ? ?Hospice and Palliative Care services outpatient were explained and offered.  Daniel Barker is active with [facility] hospice for outpatient palliative services.  Contact made with outpatient palliative services to advise of hospitalization.  They share that he is scheduled for an in-home visit on April 5.  Mr. Dirr and I talked about the benefits of hospice care for more hands-on help and support.  He shares that his sister had hospice care,  and when he questioned her she states shared that this was for "medication management, but she died within a week".  He also shares that his father had the same lung issues plus black lung and he lived to be 16.  I asked Daniel Barker if he thinks that that could be him, and he tells me he is not sure. ? ?Discussed the importance of continued conversation with family and the medical providers regarding overall plan of care and treatment options, ensuring decisions are within the context of the patient?s values and GOCs.  Questions and concerns were addressed.  The patient was encouraged to call with questions or concerns.  PMT will continue to support holistically. ? ?Conference with attending, bedside nursing staff, transition of care team related to patient condition, needs, goals of care, disposition. ? ? ?HCPOA  ?HCPOA -stepdaughter, [name]. ?  ? ?SUMMARY OF RECOMMENDATIONS   ?At this point continue full scope/full code ?Time for outcomes ?Home with home health ?Active with outpatient palliative services with [facility] ? ? ?Code Status/Advance Care Planning: ?Full code -talk about the concept of "treat the treatable, but allowing natural death".  Daniel Barker tells me that he would want life support unless he is going to be "a burden".  We talk about the realities of CPR and intubation.  At this point he is unable to set limits, stating that he would have to think about whether he would want tracheostomy or not. ? ?Symptom Management:  ?Per hospitalist, no additional needs at this time. ? ?Palliative Prophylaxis:  ?Frequent Pain Assessment ? ?Additional Recommendations (Limitations, Scope, Preferences): ?Full Scope Treatment ? ?Psycho-social/Spiritual:  ?Desire for further Chaplaincy support:no ?Additional Recommendations: Caregiving  Support/Resources and Education on Hospice ? ?Prognosis:  ?Unable to determine, based on outcomes.  6 months or less would not be surprising based on chronic illness burden, decreasing functional status, 4 hospital stays in the last 6 months. ? ?Discharge Planning:  Anticipate home with home health and outpatient palliative services.  He is active with [name] outpatient palliative and scheduled for an in-home visit 4/5.  They are aware that he is hospitalized.   ? ?  ? ?Primary Diagnoses: ?Present on Admission: ? COPD exacerbation ([place]) ? CAP (community acquired pneumonia) ? Metabolic alkalosis ? Mixed hyperlipidemia ? Paroxysmal atrial fibrillation (HCC) ? Atherosclerosis of coronary artery without angina pectoris ? Acute on chronic respiratory failure with hypoxia (HCC) ? Thrombocytopenia ([place]) ? Macrocytic anemia ? Hypokalemia ? ? ?I have reviewed the medical record, interviewed the patient and family, and examined the patient. The following aspects are pertinent. ? ?Past Medical  History:  ?Diagnosis Date  ? Acute metabolic encephalopathy 2/42/3536  ? Asthma   ? Atrial fibrillation ([place])   ? BPH (benign prostatic hyperplasia)   ? Cigarette smoker   ? CKD (chronic kidney disease)   ? COPD (chronic obstructive pulmonary disease) ([place])   ? Coronary artery disease   ? Cough   ? High cholesterol   ? Hx of CABG   ? Hypercholesteremia   ? Hypertension   ? Localized edema   ? Stented coronary artery   ? ?Social History  ? ?Socioeconomic History  ? Marital status: Married  ?  Spouse name: [name]  ? Number of children: 3  ? Years of education: high school  ? Highest education level: 12th grade  ?Occupational History  ? Occupation: retired  ?  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 Smoking status: Former  ?  Packs/day: 1.00  ?  Years: 60.00  ?  Pack years: 60.00  ?  Types: Cigarettes  ?  Quit date: 09/01/2020  ?  Years since quitting: 1.4  ? Smokeless tobacco: Never  ?Vaping Use  ? Vaping Use: Never used  ?Substance and Sexual Activity  ? Alcohol use: Never  ? Drug use: Never  ? Sexual activity: Not on file  ?Other Topics Concern  ? Not on file  ?Social History Narrative  ? Lives with step daughter, [name] who moved them from [facility] [place] to   ? Uses [employer] [facility] for Oxygen  ? He is hard of hearing and needs eye surgery  ? 12 th grade education  ? ?Social Determinants of Health  ? ?Financial Resource Strain: Low Risk   ? Difficulty of Paying Living Expenses: Not hard at all  ?Food Insecurity: No Food Insecurity  ? Worried About Charity fundraiser in the Last Year: Never true  ? Ran Out of Food in the Last Year: Never true  ?Transportation Needs: Unmet Transportation Needs  ? Lack of Transportation (Medical): Yes  ? Lack of Transportation (Non-Medical): No  ?Physical Activity: Not on file  ?Stress: No Stress Concern Present  ? Feeling of Stress : Only a little  ?Social Connections: Unknown  ? Frequency of Communication with Friends  and Family: More than three times a week  ? Frequency of Social Gatherings with Friends and Family: More than three times a week  ? Attends Religious Services: Patient refused  ? Active Member of Clubs or Organi

## 2022-02-21 NOTE — Progress Notes (Signed)
?PROGRESS NOTE ? ? ? ?Daniel Barker  FFM:[id number] DOB: Oct 26, 1946 DOA: 02/15/2022 ?PCP: [name], MD  ? ?Brief Narrative:  ?HPI per Dr. Carlynn Purl on 02/16/22 ?HPI: Daniel Barker is a 76 y.o. male with medical history significant of atrial for relation, CKD, COPD, hyperlipidemia, hypertension, coronary artery disease with stent, and more presents to ED with a chief complaint of dyspnea.  Patient reports that he was fine when he went to sleep last night.  He got up to go to the bathroom several times during the night and also felt fine.  When he woke up this morning he was acutely short of breath.  It was worse with exertion.  He had no associated chest pain, palpitations.  But he did have cough and wheeze.  It is nonproductive.  Patient denies any fevers.  He has a nebulizer at home and was using it every 6 hours throughout the day.  At first it seemed to make no difference, some of the later treatments gave temporary relief.  Patient reports that he does wear 2 L nasal cannula at home, he did not try to increase the supplementation.  Patient also reports he heard a rattling in his lungs, but could not cough anything up.  Patient is under the impression that ever since he started elevating his legs his breathing is worse.  He does not have peripheral edema at the time of my exam.  He did have 900 cc output in the ED, and has wrinkled skin in the lower extremities, like there was peripheral edema there initially.  He has had any changes to his torsemide at home.  He does report that his blood pressure was dropping too low so one of his blood pressure medications was changed.  He is not sure which 1.  Patient also reports he feels a fullness in his abdomen.  Today he is comfortable, but sometimes it feels like fluid is collecting there.  Lastly patient complains of numbness in his feet that is chronic.  Patient has no other complaints at this time. ?  ?Patient does not smoke, has about 1 drink of alcohol  every few months, and does not use illicit drugs.  He has had 2 COVID vaccines and the flu vaccine.  Patient is full code. ? ?**Interim History ?Patient is slow to improve and feels about 70% better today and states he is able to cough up a little sputum.  He was given IV Solu-Medrol and will continue this today.  Adjustments have been made in his breathing treatments and we have stopped his inhaler and started Brovana and budesonide in addition to his other neb treatments.  We will continue his home torsemide and he will need an ambulatory home O2 screen prior to discharge.  Given his slow improvement we will discuss with pulmonary about further evaluation recommendations and patient does not wish to see Dr. [name] currently so will hold off formal consultation.  ? ?He has a toe ulcer noted on his second toe so have asked [facility] nurse to evaluate and they have recommended topical care. Foot X-Ray showed "Normal bone mineralization. Minimal great toe metatarsophalangeal lateral joint space narrowing and lateral peripheral degenerative osteophytes. Tiny well corticated chronic ossicle just distal to the fibula, likely the sequela of remote trauma. Small plantar calcaneal heel spur. No cortical  erosion. No subcutaneous air. No acute fracture or dislocation." ?We will work the patient up and get ABIs to ensure proper blood flow to the feet. ? ?  Patient continues to be intermittently dyspneic and had to be placed on 4 L supplemental oxygen yesterday morning but was weaned back down.  I discussed with him about starting morphine for his dyspnea and he is agreeable.  PT OT did recommend home health.  Pulmonary to see the patient in the morning and palliative to have further goals of care discussion.  Patient is slowly improving and pulmonary evaluated and he has end-stage COPD with emphysema with little reserve and his lung function and pulmonology feels that he cannot improve much more beyond where he is at presently.  They  are recommending setting limits on his care and continuing further goals of care discussion and recommended continue as needed morphine for dyspnea.  ? ? ?Assessment and Plan: ?* COPD exacerbation ([place]) ?-Triggered by Suspected pneumonia but PNA ruled out ?-Continue scheduled DuoNeb every 6 scheduled, as needed albuterol ?-Continue Steroids and received Solumedrol 125 mg x1 and will be continuing IV Solumedrol for now  ?-Continue Zithromax but stopped Rocephin ?-Obtain Sputum Cx ?-Continued mometasone-formoterol 2 puffs IH twice daily but stopped and started him on Budesonide and Brovana ?-Continue with montelukast 10 mg p.o. daily and Fluticasone 50 mcg/ACT 1 spray each nare ?-We will add guaifenesin 1200 mg p.o. twice daily, incentive spirometry and flutter valve ?-Chest x-ray done and showed Bibasilar reticular and nodular densities, slightly progressed since the prior radiograph and may represent developing infiltrate. No focal consolidation, pleural effusion, pneumothorax. The cardiac silhouette is within limits. Atherosclerotic calcification of the aorta. Median sternotomy wires. No acute osseous pathology."  ?-Continue monitor respiratory status carefully and repeat chest x-ray in a.m. ?-We will discuss with Pulmonary about further evaluation recommendations but he does not want to see the Pulmonologist  ?-Given his continued dyspnea we may try some oral morphine for symptom relief and he is agreeable so started 5 mg oral q4hprn  ?-Pulmonary evaluated and he has End Stage COPD with little reserve and will continue Palliative Efforts  ?  ? ?Toe ulcer ([place]) ?-Noted to have a Toe Ulcer on the 2nd toe of Left Foot ?-[facility] Nurse Consult ?-Will order a Plain Film X-Ray and showed "Normal bone mineralization. Minimal great toe metatarsophalangeal lateral joint space narrowing and lateral peripheral degenerative osteophytes. Tiny well corticated chronic ossicle just distal to the fibula, likely the sequela of remote  trauma. Small plantar calcaneal heel spur. No cortical erosion. No subcutaneous air. No acute fracture or dislocation." ?-Will order ABI's while hospitalized and showed "Resting ABI of the bilateral lower extremity in the moderate range arterial occlusive disease.   ?Monophasic waveforms at the bilateral ankles." -Patient is not Diabetic but is Pre-Diabetic  ?-Consider further Imaging and Workup but patient is not complaining of any pain currently ?-Will need Podiatric Follow up at D/C  ? ?Leukocytosis ?-Worsened In the setting of Steroid Demargination ?-Patient's WBC went from 12.4 -> 10.3 -> 12.2 -> 9.6 -> 8.0 x2 -> 10.6 ?-Continue to Monitor and is on Abx ?-Repeat CBC in the AM  ? ?Acute kidney injury superimposed on chronic kidney disease ([place]) ?-Baseline Cr is Stage 3a or 3b but likely 3b ?-Patient's BUNs/creatinine was elevated and went from 38/1.96 -> 38/1.74 -> 43/2.03 -> 43/1.72 -> 46/1.89 -> 46/1.89 -> 58/1,82 ?-Continue with home diuretics and avoid further nephrotoxic medications, consciousness, hypotension and renally adjust medications ?-Discontinued IVF ?-Repeat CMP in a.m. ? ?Chronic diastolic CHF (congestive heart failure) ([place]) ?-Possibly contributing to his shortness of breath and worsening dyspnea on exertion; does not appear to  be in acute exacerbation ?-Given IV Lasix 60 mg x1 and then resumed on his home diuretics.  Was getting IV fluid hydration but this has not been discontinued ?-Last echo showed an EF of 65 to 70% with grade 2 diastolic dysfunction ?-BNP was elevated at 409.0 but is improved from last check back in January of 568.0 ?-Resume home diuretics ?-Strict I's and O's and daily weights; Patient is -5.125 Liters ?-Continue to monitor respiratory status carefully and repeat chest x-ray in the a.m. ? ?Acute on chronic respiratory failure with hypoxia ([facility]) ?-See COPD ?-May try some oral morphine for symptom relief ?-Palliative care has been consulted for further goals of care  discussion ? ? ?Metabolic alkalosis ?-Patient has a bicarb of 41 and now patient's BUNs/creatinine is 58/1.82 with an anion gap of 8, chloride level 95 ?-This is after 900 cc urine output status post Lasix ?-[place]

## 2022-02-21 NOTE — Evaluation (Signed)
Occupational Therapy Evaluation ?Patient Details ?Name: Daniel Barker ?MRN: [id number] ?DOB: Jul 28, 1946 ?Today's Date: 02/21/2022 ? ? ?History of Present Illness Daniel Barker is a 76 y.o. male with medical history significant of atrial for relation, CKD, COPD, hyperlipidemia, hypertension, coronary artery disease with stent, and more presents to ED with a chief complaint of dyspnea.  Patient reports that he was fine when he went to sleep last night.  He got up to go to the bathroom several times during the night and also felt fine.  When he woke up this morning he was acutely short of breath.  It was worse with exertion.  He had no associated chest pain, palpitations.  But he did have cough and wheeze.  It is nonproductive.  Patient denies any fevers.  He has a nebulizer at home and was using it every 6 hours throughout the day.  At first it seemed to make no difference, some of the later treatments gave temporary relief.  Patient reports that he does wear 2 L nasal cannula at home, he did not try to increase the supplementation.  Patient also reports he heard a rattling in his lungs, but could not cough anything up.  Patient is under the impression that ever since he started elevating his legs his breathing is worse.  He does not have peripheral edema at the time of my exam.  He did have 900 cc output in the ED, and has wrinkled skin in the lower extremities, like there was peripheral edema there initially.  He has had any changes to his torsemide at home.  He does report that his blood pressure was dropping too low so one of his blood pressure medications was changed.  He is not sure which 1.  Patient also reports he feels a fullness in his abdomen.  Today he is comfortable, but sometimes it feels like fluid is collecting there.  Lastly patient complains of numbness in his feet that is chronic.  Patient has no other complaints at this time.  ? ?Clinical Impression ?  ?Pt seated in recliner upon therapy arrival.  Pt is close to baseline for ADL completion. Pt is currently experiencing decreased activity tolerance and fatigue during ADL tasks requiring him to take more rest breaks than typically needed as well as physical assistance. Plan is to return home with HHPT although patient would also benefit from [facility] as well to work on increasing activity tolerance and endurance level in order to complete ADL tasks safely at home with less fatigue.   ?   ? ?Recommendations for follow up therapy are one component of a multi-disciplinary discharge planning process, led by the attending physician.  Recommendations may be updated based on patient status, additional functional criteria and insurance authorization.  ? ?Follow Up Recommendations ? Home health OT  ?  ?Assistance Recommended at Discharge Set up Supervision/Assistance  ?Patient can return home with the following Assistance with cooking/housework;Assist for transportation;A little help with bathing/dressing/bathroom ? ?  ?Functional Status Assessment ? Patient has had a recent decline in their functional status and demonstrates the ability to make significant improvements in function in a reasonable and predictable amount of time.  ?Equipment Recommendations ? None recommended by OT  ?  ?   ?Precautions / Restrictions Precautions ?Precautions: Fall ?Restrictions ?Weight Bearing Restrictions: No  ? ?  ? ?Mobility  ?  ? ?Transfers ?  ?Equipment used: Rolling walker (2 wheels) ?Transfers: Sit to/from Stand, Bed to chair/wheelchair/BSC ?Sit to Stand: Supervision ?  ?  ?  Step pivot transfers: Supervision ?  ?  ?General transfer comment: slightly labored movement, increased time ?  ? ?  ?Balance Overall balance assessment: Needs assistance ?Sitting-balance support: Feet supported, No upper extremity supported ?Sitting balance-Leahy Scale: Good ?Sitting balance - Comments: seated on toilet ?  ?Standing balance support: During functional activity, Bilateral upper extremity  supported ?Standing balance-Leahy Scale: Fair ?  ?   ? ?ADL either performed or assessed with clinical judgement  ? ?ADL Overall ADL's : Needs assistance/impaired ?Eating/Feeding: Modified independent;Sitting ?  ?Grooming: Wash/dry hands;Supervision/safety;Standing ?  ?Upper Body Bathing: Set up;Sitting ?  ?Lower Body Bathing: Minimal assistance;Sitting/lateral leans;Sit to/from stand ?  ?Upper Body Dressing : Set up;Sitting ?  ?Lower Body Dressing: Minimal assistance;Sitting/lateral leans;Sit to/from stand ?  ?Toilet Transfer: Supervision/safety;Regular Toilet;Grab bars ?Toilet Transfer Details (indicate cue type and reason): Assist provided to manage lines. ?Toileting- Clothing Manipulation and Hygiene: Minimal assistance;Sit to/from stand ?  ?  ?  ?  ?   ? ? ? ?Vision Baseline Vision/History: 0 No visual deficits ?Patient Visual Report: No change from baseline ?   ?   ?   ?   ? ?Pertinent Vitals/Pain Pain Assessment ?Pain Assessment: No/denies pain  ? ? ? ?Hand Dominance Left ?  ?Extremity/Trunk Assessment Upper Extremity Assessment ?Upper Extremity Assessment: Generalized weakness ?  ?Lower Extremity Assessment ?Lower Extremity Assessment: Defer to PT evaluation ?  ?  ?  ?Communication Communication ?Communication: HOH ?  ?Cognition Arousal/Alertness: Awake/alert ?Behavior During Therapy: [facility] for tasks assessed/performed ?Overall Cognitive Status: Within Functional Limits for tasks assessed ?  ?  ?  ?  ?  ?   ?   ?   ? ? ?Home Living Family/patient expects to be discharged to:: Private residence ?Living Arrangements: Spouse/significant other ?Available Help at Discharge: Family;Available PRN/intermittently ?Type of Home: House ?Home Access: Stairs to enter ?Entrance Stairs-Number of Steps: 3 ?Entrance Stairs-Rails: Right;Left;Can reach both ?Home Layout: One level ?  ?  ?Bathroom Shower/Tub: Tub/shower unit ?  ?Bathroom Toilet: Handicapped height ?Bathroom Accessibility: Yes ?  ?Home Equipment: Conservation officer, nature (2  wheels);Cane - single point;BSC/3in1;Grab bars - tub/shower;Shower seat ?  ?Additional Comments: patient reports that his wife has Dementia and she unable to assist him. Step daughter is able to assist PRN. A home health aide is there M-F for 2 hours to assist as needed. ?  ? ?  ?Prior Functioning/Environment Prior Level of Function : Independent/Modified Independent ?  ?  ?  ?  ?  ?  ?Mobility Comments: Household ambulator using RW ?ADLs Comments: assisted by family and home aides ?  ? ?  ?  ?OT Problem List: Decreased activity tolerance;Impaired balance (sitting and/or standing);Decreased strength ?  ?   ?OT Treatment/Interventions:    ?  ?OT Goals(Current goals can be found in the care plan section) Acute Rehab OT Goals ?Patient Stated Goal: to return home and increase his strength  ?OT Frequency:   ?  ? ?   ?AM-PAC OT "6 Clicks" Daily Activity     ?Outcome Measure Help from another person eating meals?: None ?Help from another person taking care of personal grooming?: None ?Help from another person toileting, which includes using toliet, bedpan, or urinal?: A Little ?Help from another person bathing (including washing, rinsing, drying)?: A Little ?Help from another person to put on and taking off regular upper body clothing?: None ?Help from another person to put on and taking off regular lower body clothing?: A Little ?6 Click Score: 21 ?  ?  End of Session Equipment Utilized During Treatment: Rolling walker (2 wheels);Oxygen ?Nurse Communication: Other (comment) (Aide informed of catheter needing to be reapplied) ? ?Activity Tolerance: Patient tolerated treatment well;Other (comment) (Limited by decreased activity tolerance.) ?Patient left: in chair;with call bell/phone within reach ? ?OT Visit Diagnosis: Muscle weakness (generalized) (M62.81)  ?              ?Time: 0950-1005 ?OT Time Calculation (min): 15 min ?Charges:  OT General Charges ?$OT Visit: 1 Visit ?OT Evaluation ?$OT Eval Low Complexity: 1 Low ?OT  Treatments ?$Self Care/Home Management : 8-22 mins ? ?Ailene Ravel, OTR/L,CBIS  ?[phone] ? ? ?Jewett Mcgann, Clarene Duke ?02/21/2022, 10:11 AM ?

## 2022-02-22 ENCOUNTER — Inpatient Hospital Stay (HOSPITAL_COMMUNITY): Payer: Medicare Other

## 2022-02-22 LAB — CBC WITH DIFFERENTIAL/PLATELET
Abs Immature Granulocytes: 0.51 10*3/uL — ABNORMAL HIGH (ref 0.00–0.07)
Basophils Absolute: 0.1 10*3/uL (ref 0.0–0.1)
Basophils Relative: 1 %
Eosinophils Absolute: 0 10*3/uL (ref 0.0–0.5)
Eosinophils Relative: 0 %
HCT: 34.1 % — ABNORMAL LOW (ref 39.0–52.0)
Hemoglobin: 10.7 g/dL — ABNORMAL LOW (ref 13.0–17.0)
Immature Granulocytes: 3 %
Lymphocytes Relative: 2 %
Lymphs Abs: 0.4 10*3/uL — ABNORMAL LOW (ref 0.7–4.0)
MCH: 32.6 pg (ref 26.0–34.0)
MCHC: 31.4 g/dL (ref 30.0–36.0)
MCV: 104 fL — ABNORMAL HIGH (ref 80.0–100.0)
Monocytes Absolute: 0.6 10*3/uL (ref 0.1–1.0)
Monocytes Relative: 4 %
Neutro Abs: 14.2 10*3/uL — ABNORMAL HIGH (ref 1.7–7.7)
Neutrophils Relative %: 90 %
Platelets: 181 10*3/uL (ref 150–400)
RBC: 3.28 MIL/uL — ABNORMAL LOW (ref 4.22–5.81)
RDW: 15.1 % (ref 11.5–15.5)
WBC: 15.8 10*3/uL — ABNORMAL HIGH (ref 4.0–10.5)
nRBC: 0.3 % — ABNORMAL HIGH (ref 0.0–0.2)

## 2022-02-22 LAB — COMPREHENSIVE METABOLIC PANEL
ALT: 18 U/L (ref 0–44)
AST: 13 U/L — ABNORMAL LOW (ref 15–41)
Albumin: 3.2 g/dL — ABNORMAL LOW (ref 3.5–5.0)
Alkaline Phosphatase: 40 U/L (ref 38–126)
Anion gap: 10 (ref 5–15)
BUN: 75 mg/dL — ABNORMAL HIGH (ref 8–23)
CO2: 39 mmol/L — ABNORMAL HIGH (ref 22–32)
Calcium: 8.9 mg/dL (ref 8.9–10.3)
Chloride: 92 mmol/L — ABNORMAL LOW (ref 98–111)
Creatinine, Ser: 2.2 mg/dL — ABNORMAL HIGH (ref 0.61–1.24)
GFR, Estimated: 30 mL/min — ABNORMAL LOW (ref >60)
Glucose, Bld: 188 mg/dL — ABNORMAL HIGH (ref 70–99)
Potassium: 4 mmol/L (ref 3.5–5.1)
Sodium: 141 mmol/L (ref 135–145)
Total Bilirubin: 0.8 mg/dL (ref 0.3–1.2)
Total Protein: 5.8 g/dL — ABNORMAL LOW (ref 6.5–8.1)

## 2022-02-22 LAB — GLUCOSE, CAPILLARY
Glucose-Capillary: 157 mg/dL — ABNORMAL HIGH (ref 70–99)
Glucose-Capillary: 111 mg/dL — ABNORMAL HIGH (ref 70–99)
Glucose-Capillary: 136 mg/dL — ABNORMAL HIGH (ref 70–99)
Glucose-Capillary: 214 mg/dL — ABNORMAL HIGH (ref 70–99)

## 2022-02-22 LAB — PHOSPHORUS: Phosphorus: 4.4 mg/dL (ref 2.5–4.6)

## 2022-02-22 LAB — MAGNESIUM: Magnesium: 2.4 mg/dL (ref 1.7–2.4)

## 2022-02-22 NOTE — Progress Notes (Signed)
?PROGRESS NOTE ? ? ? ?Daniel Barker  VQM:[id number] DOB: Apr 02, 1946 DOA: 02/15/2022 ?PCP: [name], MD  ? ?Brief Narrative:  ?HPI per Dr. Carlynn Purl on 02/16/22 ?HPI: Daniel Barker is a 76 y.o. male with medical history significant of atrial for relation, CKD, COPD, hyperlipidemia, hypertension, coronary artery disease with stent, and more presents to ED with a chief complaint of dyspnea.  Patient reports that he was fine when he went to sleep last night.  He got up to go to the bathroom several times during the night and also felt fine.  When he woke up this morning he was acutely short of breath.  It was worse with exertion.  He had no associated chest pain, palpitations.  But he did have cough and wheeze.  It is nonproductive.  Patient denies any fevers.  He has a nebulizer at home and was using it every 6 hours throughout the day.  At first it seemed to make no difference, some of the later treatments gave temporary relief.  Patient reports that he does wear 2 L nasal cannula at home, he did not try to increase the supplementation.  Patient also reports he heard a rattling in his lungs, but could not cough anything up.  Patient is under the impression that ever since he started elevating his legs his breathing is worse.  He does not have peripheral edema at the time of my exam.  He did have 900 cc output in the ED, and has wrinkled skin in the lower extremities, like there was peripheral edema there initially.  He has had any changes to his torsemide at home.  He does report that his blood pressure was dropping too low so one of his blood pressure medications was changed.  He is not sure which 1.  Patient also reports he feels a fullness in his abdomen.  Today he is comfortable, but sometimes it feels like fluid is collecting there.  Lastly patient complains of numbness in his feet that is chronic.  Patient has no other complaints at this time. ?  ?Patient does not smoke, has about 1 drink of alcohol  every few months, and does not use illicit drugs.  He has had 2 COVID vaccines and the flu vaccine.  Patient is full code. ? ?**Interim History ?Patient is slow to improve and feels about 70% better today and states he is able to cough up a little sputum.  He was given IV Solu-Medrol and will continue this today.  Adjustments have been made in his breathing treatments and we have stopped his inhaler and started Brovana and budesonide in addition to his other neb treatments.  We will continue his home torsemide and he will need an ambulatory home O2 screen prior to discharge.  Given his slow improvement we will discuss with pulmonary about further evaluation recommendations and patient does not wish to see Dr. [name] currently so will hold off formal consultation.  ? ?He has a toe ulcer noted on his second toe so have asked [facility] nurse to evaluate and they have recommended topical care. Foot X-Ray showed "Normal bone mineralization. Minimal great toe metatarsophalangeal lateral joint space narrowing and lateral peripheral degenerative osteophytes. Tiny well corticated chronic ossicle just distal to the fibula, likely the sequela of remote trauma. Small plantar calcaneal heel spur. No cortical  erosion. No subcutaneous air. No acute fracture or dislocation." ?We will work the patient up and get ABIs to ensure proper blood flow to the feet. ? ?  Patient continues to be intermittently dyspneic and had to be placed on 4 L supplemental oxygen yesterday morning but was weaned back down.  I discussed with him about starting morphine for his dyspnea and he is agreeable.  PT OT did recommend home health.  Pulmonary to see the patient in the morning and palliative to have further goals of care discussion.  Patient is slowly improving and pulmonary evaluated and he has end-stage COPD with emphysema with little reserve and his lung function and pulmonology feels that he cannot improve much more beyond where he is at presently.  They  are recommending setting limits on his care and continuing further goals of care discussion and recommended continue as needed morphine for dyspnea. ? ?Continues to be deconditioned and was little worse/so consulted PT again for reevaluation.  Sputum cultures growing out Pseudomonas and corynebacterium and he is on doxycycline which we will continue and change if necessary.  Palliative care to have continued goals of care discussion with the patient.  ? ? ?Assessment and Plan: ?* COPD exacerbation ([place]) ?-Triggered by Suspected pneumonia but PNA ruled out ?-Continue scheduled DuoNeb every 6 scheduled, as needed albuterol ?-Continue Steroids and received Solumedrol 125 mg x1 and will be continuing IV Solumedrol for now and start weaning  ?-Continued Zithromax but stopped Rocephin and now on Doxycycline  ?-Obtain Sputum Cx; Obtained 3/25 and growing Pseudomonas Aeruginosa and Abundant Diphtheroids (Cornyebacterium Species) but could be colonization given no evidence of PNA on CXR ?-Continued mometasone-formoterol 2 puffs IH twice daily but stopped and started him on Budesonide and Brovana ?-Continue with montelukast 10 mg p.o. daily and Fluticasone 50 mcg/ACT 1 spray each nare ?-We will add guaifenesin 1200 mg p.o. twice daily, incentive spirometry and flutter valve ?-Chest x-ray done and showed Bibasilar reticular and nodular densities, slightly progressed since the prior radiograph and may represent developing infiltrate. No focal consolidation, pleural effusion, pneumothorax. The cardiac silhouette is within limits. Atherosclerotic calcification of the aorta. Median sternotomy wires. No acute osseous pathology."  ?-Continue monitor respiratory status carefully and repeat chest x-ray in a.m. ?-Given his continued dyspnea we may try some oral morphine for symptom relief and he is agreeable so started 5 mg oral q4hprn  ?-Pulmonary evaluated and he has End Stage COPD with little reserve and will continue Palliative  Efforts  ?-CXR today showed "Emphysema. No acute cardiopulmonary abnormality." ?  ?  ? ?Toe ulcer ([place]) ?-Noted to have a Toe Ulcer on the 2nd toe of Left Foot ?-WOC Nurse Consult ?-Will order a Plain Film X-Ray and showed "Normal bone mineralization. Minimal great toe metatarsophalangeal lateral joint space narrowing and lateral peripheral degenerative osteophytes. Tiny well corticated chronic ossicle just distal to the fibula, likely the sequela of remote trauma. Small plantar calcaneal heel spur. No cortical erosion. No subcutaneous air. No acute fracture or dislocation." ?-Will order ABI's while hospitalized and showed "Resting ABI of the bilateral lower extremity in the moderate range arterial occlusive disease.   ?Monophasic waveforms at the bilateral ankles." -Patient is not Diabetic but is Pre-Diabetic  ?-Consider further Imaging and Workup but patient is not complaining of any pain currently ?-Will need Podiatric Follow up at D/C  ? ?Leukocytosis ?-Worsened In the setting of Steroid Demargination as he is on IV Solumedrol 60 mg po BID and be hopefully transitioned to Prednisone soon ?-Patient's WBC went from 12.4 -> 10.3 -> 12.2 -> 9.6 -> 8.0 x2 -> 10.6 -> 15.8 ?-Continue to Monitor and is on Abx with Doxycycline now  ?-  Repeat CBC in the AM  ? ?Acute kidney injury superimposed on chronic kidney disease ([place]) ?-Baseline Cr is Stage 3a or 3b but likely 3b ?-Patient's BUNs/creatinine was elevated and went from 38/1.96 -> 38/1.74 -> 43/2.03 -> 43/1.72 -> 46/1.89 -> 46/1.89 -> 58/1.82 -> 75/2.20 ?-Continue with home diuretics and avoid further nephrotoxic medications, consciousness, hypotension and renally adjust medications ?-Discontinued IVF ?-Repeat CMP in a.m. ? ?Chronic diastolic CHF (congestive heart failure) ([place]) ?-Possibly contributing to his shortness of breath and worsening dyspnea on exertion; does not appear to be in acute exacerbation ?-Given IV Lasix 60 mg x1 and then resumed on his home  diuretics.  Was getting IV fluid hydration but this has not been discontinued ?-Last echo showed an EF of 65 to 70% with grade 2 diastolic dysfunction ?-BNP was elevated at 409.0 but is improved from last check ba

## 2022-02-22 NOTE — Progress Notes (Signed)
Palliative:   ?Daniel Barker is lying quietly in bed.  He greets me, not making or keeping eye contact.  He appears acutely/chronically ill and frail.  He is alert and oriented X 3, able to make his basic needs known.  There is now family at bedside at this time.  ? ?We talk about pulmonology visit yesterday.  Daniel Barker tells me that low dose morphine by mouth has helped him with how he feels about his breathing.  He shares that he felt he had a good and enlightening visit with Dr. Halford Chessman.  We talk about the benefits of hospice for "treat the treatable" care.  He is active with [facility] outpatient palliative and scheduled for next visit 4/5.   ? ?We talk about Code Status.  Daniel Barker tells me that he would not want to be intubated.  We talk about the realities of ventilator support.  Daniel Barker sates that he would accept CPR, defibrillation, ACLS meds, and BiPAP.   ? ?Plan:   Continue to treat the treatable, NO INTUBATION.  Home with out patient palliative with [facility], encouraged to discuss hospice care.  ? ?35 minutes  ?Quinn Axe, NP ?Palliative: Medicine Team  ?Team phone [phone] ?Greater than 50% of this time was spent counseling and coordinating care related to the above assessment and plan.  ? ?

## 2022-02-22 NOTE — Progress Notes (Signed)
[facility] ? ? ?Patient name: Daniel Barker Admit date: 02/15/2022  ?DOB: [birth year] LOS: 6  ?MRN: [id number] Consult date: 02/21/2022  ?Referring provider: Dr. Alfredia Ferguson  CC: Short of breath  ? ? ?History:  ?76 yo male former smoker with hx of COPD and respiratory failure on 2 liters presented to [facility] ER with dyspnea, cough and wheeze.  SpO2 65% on room air.  Started on supplemental oxygen, steroids, and neb treatment for COPD exacerbation.   ? ?Past medical history:  ?A fib, CKD 3a, COPD, HLD, HTN, CAD s/p stent, BPH ? ?Significant events:  ?3/21 admit ?3/27 palliative care consulted ? ?Studies:  ?PFT 08/25/20 >> FEV1 0.33 (12%), FEV1% 30, DLCO 30% ?CT angio chest 04/05/21 >> diffuse emphysema, 5 mm nodule RUL ?Echo 10/28/21 >> EF 65 to 70%, grade 2 DD, RVSP 32.2 mmHg ? ?Micro:  ?COVID/Flu 3/25 >> negative ?Sputum 3/25 >>  ? Lines:  ? ?  ?Antibiotics:  ?Rocephin 3/21 >> 3/22 ?Zithromax 3/21 >> 3/24 ?Doxycycline 3/25 >>  ? Consults:  ?Palliative care ?  ? ?Interim history:  ?He had a rough time in the morning when he had to use the bathroom.  His legs felt weak and he had trouble catching his breath.  He got back into bed and took some morphine.  This helped his breathing, but then he felt very sleepy. ? ?Vital signs:  ?BP 133/69 (BP Location: Right Arm)   Pulse (!) 53   Temp 97.6 ?F (36.4 ?C) (Oral)   Resp 20   Ht 5' 6.5" (1.689 m)   Wt 72.7 kg   SpO2 96%   BMI 25.48 kg/m?  ? Intake/output:  ?I/O last 3 completed shifts: ?In: 1072 [P.O.:1072] ?Out: 2300 [Urine:2300] ?  ?Physical exam:  ? ?General - alert ?Eyes - pupils reactive ?ENT - no sinus tenderness, no stridor ?Cardiac - regular rate/rhythm, no murmur ?Chest - decreased breath sounds b/l, no wheezing or rales ?Abdomen - soft, non tender, + bowel sounds ?Extremities - no cyanosis, clubbing, or edema ?Skin - no rashes ?Neuro - normal strength, moves extremities, follows commands ?Psych - normal mood  and behavior ? Best practice:  ? ?DVT - SQ heparin ?SUP - Protonix ?Nutrition - heart healthy  ? ?Assessment/plan:  ? ?Acute on chronic hypoxia/hypercapnic respiratory failure from COPD exacerbation. ?Very severe COPD with emphysema. ?- hopefully can transition to prednisone soon ?- Abx day 8, per primary team ?- continue brovana, pulmicort, yupelri ?- prn albuterol ?- goal SpO2 88 to 95% ?- prn morphine for dyspnea ? ?Deconditioning. ?- PT consulted ? ?Anemia of chronic disease. ?CKD 3a. 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 ?Toe ulcer. ?Chronic diastolic CHF. ?CAD s/p CABG. ?- per primary team ? ?Goals of care. ?- palliative care consulted ?- he has decided against intubation ?- he wants to speak in more detail with his step daughter, Daniel Barker ?- he has outpt appointment with hospice and palliative care in April ? ? ?Resolved hospital problems:  ? ? ?Goals of care/Family discussions:  ?Code status: no intubation ? ?Labs:  ? ? ?  Latest Ref Rng & Units 02/22/2022  ?  5:08 AM 02/21/2022  ?  5:23 AM 02/20/2022  ?  5:10 AM  ?CMP  ?Glucose 70 - 99 mg/dL 188   191   266    ?BUN 8 - 23 mg/dL 75   58   50    ?Creatinine 0.61 - 1.24 mg/dL 2.20   1.82  1.89    ?Sodium 135 - 145 mmol/L 141   143   143    ?Potassium 3.5 - 5.1 mmol/L 4.0   4.2   3.3    ?Chloride 98 - 111 mmol/L 92   94   95    ?CO2 22 - 32 mmol/L 39   41   39    ?Calcium 8.9 - 10.3 mg/dL 8.9   8.9   8.8    ?Total Protein 6.5 - 8.1 g/dL 5.8   5.7   5.8    ?Total Bilirubin 0.3 - 1.2 mg/dL 0.8   0.6   0.6    ?Alkaline Phos 38 - 126 U/L 40   42   47    ?AST 15 - 41 U/L 13   15   13     ?ALT 0 - 44 U/L 18   16   15     ? ? ? ?  Latest Ref Rng & Units 02/22/2022  ?  5:08 AM 02/21/2022  ?  5:23 AM 02/20/2022  ?  5:10 AM  ?CBC  ?WBC 4.0 - 10.5 K/uL 15.8   10.6   8.0    ?Hemoglobin 13.0 - 17.0 g/dL 10.7   10.2   10.5    ?Hematocrit 39.0 - 52.0 % 34.1   33.9   33.3    ?Platelets 150 - 400 K/uL 181   178   167    ? ? ?ABG ?   ?Component Value Date/Time  ? PHART 7.49 (H)  02/17/2022 1353  ? PCO2ART 58 (H) 02/17/2022 1353  ? PO2ART 85 02/17/2022 1353  ? HCO3 44.7 (H) 02/17/2022 1353  ? O2SAT 98.5 02/17/2022 1353  ? ? ?CBG (last 3)  ?Recent Labs  ?  02/21/22 ?2219 02/22/22 ?0800 02/22/22 ?1157  ?GLUCAP 148* 157* 111*  ? ? ?Signature:  ?Chesley Mires, MD ?[place] ?Pager - [phone] - [phone] ?02/22/2022, 2:59 PM ? ? ? ? ? ? ? ?

## 2022-02-22 NOTE — Progress Notes (Signed)
Pt refused to do chest vest and refused/complained because I asked him to do flutter valve. ?

## 2022-02-23 ENCOUNTER — Inpatient Hospital Stay (HOSPITAL_COMMUNITY): Payer: Medicare Other

## 2022-02-23 LAB — CBC WITH DIFFERENTIAL/PLATELET
Basophils Absolute: 0 K/uL (ref 0.0–0.1)
Basophils Relative: 0 %
Eosinophils Absolute: 0 K/uL (ref 0.0–0.5)
Eosinophils Relative: 0 %
HCT: 35.2 % — ABNORMAL LOW (ref 39.0–52.0)
Hemoglobin: 11 g/dL — ABNORMAL LOW (ref 13.0–17.0)
Lymphocytes Relative: 3 %
Lymphs Abs: 0.3 K/uL — ABNORMAL LOW (ref 0.7–4.0)
MCH: 32.4 pg (ref 26.0–34.0)
MCHC: 31.3 g/dL (ref 30.0–36.0)
MCV: 103.8 fL — ABNORMAL HIGH (ref 80.0–100.0)
Monocytes Absolute: 0.3 K/uL (ref 0.1–1.0)
Monocytes Relative: 3 %
Myelocytes: 2 %
Neutro Abs: 10.7 K/uL — ABNORMAL HIGH (ref 1.7–7.7)
Neutrophils Relative %: 92 %
Platelets: 163 K/uL (ref 150–400)
RBC: 3.39 MIL/uL — ABNORMAL LOW (ref 4.22–5.81)
RDW: 14.9 % (ref 11.5–15.5)
WBC: 11.6 K/uL — ABNORMAL HIGH (ref 4.0–10.5)
nRBC: 0.3 % — ABNORMAL HIGH (ref 0.0–0.2)

## 2022-02-23 LAB — GLUCOSE, CAPILLARY
Glucose-Capillary: 164 mg/dL — ABNORMAL HIGH (ref 70–99)
Glucose-Capillary: 167 mg/dL — ABNORMAL HIGH (ref 70–99)

## 2022-02-23 LAB — CULTURE, RESPIRATORY W GRAM STAIN: Gram Stain: NONE SEEN

## 2022-02-23 LAB — COMPREHENSIVE METABOLIC PANEL WITH GFR
ALT: 16 U/L (ref 0–44)
AST: 11 U/L — ABNORMAL LOW (ref 15–41)
Albumin: 3.1 g/dL — ABNORMAL LOW (ref 3.5–5.0)
Alkaline Phosphatase: 41 U/L (ref 38–126)
Anion gap: 9 (ref 5–15)
BUN: 68 mg/dL — ABNORMAL HIGH (ref 8–23)
CO2: 39 mmol/L — ABNORMAL HIGH (ref 22–32)
Calcium: 9 mg/dL (ref 8.9–10.3)
Chloride: 93 mmol/L — ABNORMAL LOW (ref 98–111)
Creatinine, Ser: 1.82 mg/dL — ABNORMAL HIGH (ref 0.61–1.24)
GFR, Estimated: 38 mL/min — ABNORMAL LOW
Glucose, Bld: 184 mg/dL — ABNORMAL HIGH (ref 70–99)
Potassium: 4.1 mmol/L (ref 3.5–5.1)
Sodium: 141 mmol/L (ref 135–145)
Total Bilirubin: 0.8 mg/dL (ref 0.3–1.2)
Total Protein: 5.7 g/dL — ABNORMAL LOW (ref 6.5–8.1)

## 2022-02-23 LAB — PROCALCITONIN: Procalcitonin: <0.1 ng/mL

## 2022-02-23 LAB — PHOSPHORUS: Phosphorus: 4.2 mg/dL (ref 2.5–4.6)

## 2022-02-23 LAB — MAGNESIUM: Magnesium: 2.5 mg/dL — ABNORMAL HIGH (ref 1.7–2.4)

## 2022-02-23 MED ORDER — PREDNISONE 10 MG PO TABS: ORAL_TABLET | ORAL | 0 refills | Status: AC

## 2022-02-23 NOTE — Progress Notes (Signed)
Discharge instructions reviewed with patient and daughter.  Reviewed hospice and palliative care follow up with patient and daughter.  Both verbalized understanding of instructions and follow up care.  Patient discharged home with daughter in stable condition.   ?

## 2022-02-23 NOTE — Discharge Summary (Signed)
PatientPhysician Discharge Summary  ?Daniel Barker PYP:[id number] DOB: August 14, 1946 DOA: 02/15/2022 ? ?PCP: [name], MD ? ?Admit date: 02/15/2022 ?Discharge date: 02/23/2022 ?30 Day Unplanned Readmission Risk Score   ? ?Flowsheet Row ED to Hosp-Admission (Current) from 02/15/2022 in [facility]  ?30 Day Unplanned Readmission Risk Score (%) 52.17 Filed at 02/23/2022 0801  ? ?  ? ? This score is the patient's risk of an unplanned readmission within 30 days of being discharged (0 -100%). The score is based on dignosis, age, lab data, medications, orders, and past utilization.   ?Low:  0-14.9   Medium: 15-21.9   High: 22-29.9   Extreme: 30 and above ? ?  ? ?  ? ? ? ?Admitted From: Home ?Disposition: Home ? ?Recommendations for Outpatient Follow-up:  ?Follow up with PCP in 1-2 weeks ?Please obtain BMP/CBC in one week ?Follow-up with your pulmonologist in 2 weeks ?Please follow up with your PCP on the following pending results: ?Unresulted Labs (From admission, onward)  ? ? None  ? ?  ?  ? ? ?Home Health: Yes ?Equipment/Devices: Home oxygen ? ?Discharge Condition: Stable ?CODE STATUS: Partial code ?Diet recommendation: Cardiac ? ?Subjective: Seen and examined.  Breathing feels better.  Feels physically weak but no other complaint. ? ?Brief/Interim Summary: Daniel Barker is a 76 y.o. male with medical history significant of atrial for relation, CKD, COPD, chronic hypoxic respiratory failure on 2 L chronically, hyperlipidemia, hypertension, coronary artery disease with stent, and more presented to ED with a chief complaint of dyspnea.  He was admitted with acute on chronic hypoxic respiratory failure secondary to acute COPD exacerbation, pulmonary saw him as well.  He was started on IV Solu-Medrol as well as bronchodilators.  To make a long story short, he has improved and currently on 2 L of oxygen which is his baseline.  Pulmonary has signed off on him.  It appears that he was taking 10 mg of p.o.  prednisone daily.  Pulmonary has recommended discharging on tapering dose of prednisone which has been prescribed to him.  He was cleared from pulmonary for discharge.  Patient is little hesitant to go home only because he feels weak.  He was asked if he would like to go to SNF, he declined that stating that he has to take care of his sick wife.  PT OT had seen him who recommended home health which is being ordered for him. Obtain Sputum Cx; Obtained 3/25 and growing Pseudomonas Aeruginosa and Abundant Diphtheroids (Cornyebacterium Species) but could be colonization given no evidence of PNA on CXR.  He received multiple doses of antibiotics. ?  ?Toe ulcer ([place]) ?-Noted to have a Toe Ulcer on the 2nd toe of Left Foot.  X-ray foot showed normal bone mineralization. Minimal great toe metatarsophalangeal lateral joint space narrowing and lateral peripheral degenerative osteophytes. Tiny well corticated chronic ossicle just distal to the fibula, likely the sequela of remote trauma. Small plantar calcaneal heel spur. No cortical erosion. No subcutaneous air. No acute fracture or dislocation. Resting ABI of the bilateral lower extremity in the moderate range arterial occlusive disease.   ?Monophasic waveforms at the bilateral ankles.  Patient is advised to follow-up with podiatry as well as vascular surgery as outpatient. ?  ?AKI on CKD stage IIIb: Creatinine back at baseline. ?  ?Chronic diastolic CHF (congestive heart failure) ([place]) ?-Resume home medications. ?  ?Thrombocytopenia ([place]): Resolved. ?  ?Paroxysmal atrial fibrillation ([place]) ?-Continue Bisoprolol 5 mg p.o. twice daily and Propafenone  225 mg po q8h ?-Continue to monitor on telemetry ?-Not currently on anticoagulation ?  ?Type 2 diabetes mellitus ([place]) ?Resume home medications. ?  ?Atherosclerosis of coronary artery without angina pectoris ?-Continue Plavix, beta-blocker, Fenofibrate ?-Continue to monitor ? ?Discharge plan was discussed with patient and his  daughter [name] over the phone per his request and they verbalized understanding and agreed with it.  ?Discharge Diagnoses:  ?Principal Problem: ?  COPD exacerbation ([place]) ?Active Problems: ?  Atherosclerosis of coronary artery without angina pectoris ?  Type 2 diabetes mellitus ([place]) ?  Paroxysmal atrial fibrillation ([facility]) ?  Mixed hyperlipidemia ?  Hypokalemia ?  Macrocytic anemia ?  Thrombocytopenia ([place]) ?  CAP (community acquired pneumonia) ?  Metabolic alkalosis ?  Acute on chronic respiratory failure with hypoxia ([facility]) ?  Chronic diastolic CHF (congestive heart failure) ([place]) ?  Acute kidney injury superimposed on chronic kidney disease ([place]) ?  Leukocytosis ?  Toe ulcer ([place]) ? ? ? ?Discharge Instructions ? ? ?Allergies as of 02/23/2022   ? ?   Reactions  ? Lipitor [atorvastatin]   ? Unknown reaction  ? ?  ? ?  ?Medication List  ?  ? ?STOP taking these medications   ? ?amoxicillin-clavulanate 875-125 MG tablet ?Commonly known as: AUGMENTIN ?  ?potassium chloride SA 20 MEQ tablet ?Commonly known as: KLOR-CON M ?  ? ?  ? ?TAKE these medications   ? ?acetaminophen 500 MG tablet ?Commonly known as: TYLENOL ?Take 1,000 mg by mouth every 6 (six) hours as needed for mild pain. ?  ?albuterol (2.5 MG/3ML) 0.083% nebulizer solution ?Commonly known as: PROVENTIL ?Take 3 mLs (2.5 mg total) by nebulization every 6 (six) hours as needed for wheezing or shortness of breath. DX: J44.9. ?  ?albuterol 108 (90 Base) MCG/ACT inhaler ?Commonly known as: VENTOLIN HFA ?Inhale 2 puffs into the lungs every 4 (four) hours as needed for wheezing or shortness of breath. ?  ?amLODipine 5 MG tablet ?Commonly known as: NORVASC ?Take 1 tablet (5 mg total) by mouth daily. ?  ?bisoprolol 5 MG tablet ?Commonly known as: ZEBETA ?TAKE 1 TABLET(5 MG) BY MOUTH TWICE DAILY ?What changed: See the new instructions. ?  ?budesonide-formoterol 160-4.5 MCG/ACT inhaler ?Commonly known as: Symbicort ?USE 2 PUFFS BY MOUTH EVERY MORNING AND 2 PUFFS  EVERY EVENING ?  ?clopidogrel 75 MG tablet ?Commonly known as: PLAVIX ?Take 1 tablet (75 mg total) by mouth daily. ?  ?Compressor/Nebulizer Misc ?Inhale 1 Units into the lungs as needed. ?  ?fenofibrate micronized 134 MG capsule ?Commonly known as: LOFIBRA ?Take 134 mg by mouth daily before breakfast. ?  ?fluticasone 50 MCG/ACT nasal spray ?Commonly known as: FLONASE ?Place 1 spray into both nostrils daily. ?  ?montelukast 10 MG tablet ?Commonly known as: SINGULAIR ?Take 10 mg by mouth daily. ?  ?pantoprazole 40 MG tablet ?Commonly known as: PROTONIX ?Take 1 tablet (40 mg total) by mouth 2 (two) times daily. ?  ?polyethylene glycol powder 17 GM/SCOOP powder ?Commonly known as: GLYCOLAX/MIRALAX ?Take 1 Container by mouth in the morning and at bedtime. ?  ?predniSONE 10 MG tablet ?Commonly known as: DELTASONE ?Take 4 tablets (40 mg) p.o. daily for 3 days followed by 3 tablets (30 mg) p.o. for 3 days followed by 2 tablets (20 mg) p.o. for 3 days followed by 10 mg p.o. daily. ?What changed:  ?medication strength ?additional instructions ?  ?propafenone 225 MG tablet ?Commonly known as: RYTHMOL ?TAKE 1 TABLET(225 MG) BY MOUTH EVERY 8 HOURS ?What changed:  See the new instructions. ?  ?Spiriva Respimat 2.5 MCG/ACT Aers ?Generic drug: Tiotropium Bromide Monohydrate ?Inhale 2 puffs into the lungs daily. ?  ?tamsulosin 0.4 MG Caps capsule ?Commonly known as: FLOMAX ?Take 1 capsule (0.4 mg total) by mouth daily after supper. ?  ?torsemide 20 MG tablet ?Commonly known as: Demadex ?Take 1 tablet (20 mg total) by mouth See admin instructions. Take 1 Tablet 20 mg qam and half tab (10 mg) every evening ?  ?zinc sulfate 220 (50 Zn) MG capsule ?Take 1 capsule (220 mg total) by mouth daily. ?What changed: how much to take ?  ? ?  ? ? Follow-up Information   ? ? [name], MD Follow up in 1 week(s).   ?Specialty: Internal Medicine ?Contact information: ?[address] ?[place] ?[place] [id number] ?[phone] ? ? ?  ?  ? ? Josue Hector, MD .   ?Specialty: Cardiology ?Contact information: ?[address] ?[address] ?[place] [address] ?[phone] ? ? ?  ?  ? ?  ?  ? ?  ? ?Allergies  ?Allergen Reactions  ? Lipitor [Atorvastatin]

## 2022-02-23 NOTE — TOC Transition Note (Signed)
Transition of Care (TOC) - CM/SW Discharge Note ? ? ?Patient Details  ?Name: Daniel Barker ?MRN: [id number] ?Date of Birth: October 15, 1946 ? ?Transition of Care (TOC) CM/SW Contact:  ?Boneta Lucks, RN ?Phone Number: ?02/23/2022, 11:31 AM ? ? ?Clinical Narrative:   Patient discharging home, active with [facility] wanting to transition to home with hospice. Referral sent, Hospice with call the daughter about equipment needs. Daughter will transport patient home. [facility] updated with DC plan.  ? ? ?Final next level of care: [facility] ?Barriers to Discharge: Barriers Resolved ? ? ?Patient Goals and CMS Choice ?Patient states their goals for this hospitalization and ongoing recovery are:: to go home with hospice ?CMS Medicare.gov Compare Post Acute Care list provided to:: Patient ?Choice offered to / list presented to : Patient ? ?Discharge Placement ?   ?Patient and family notified of of transfer: 02/23/22 ? ?Discharge Plan and Services ?In-house Referral: Clinical Social Work ?Discharge Planning Services: CM Consult ?           ?   ?Readmission Risk Interventions ? ?  02/23/2022  ? 11:30 AM 02/17/2022  ? 11:15 AM 12/31/2021  ?  1:32 PM  ?Readmission Risk Prevention Plan  ?Transportation Screening Complete Complete Complete  ?Medication Review Press photographer) Complete Complete Complete  ?PCP or Specialist appointment within 3-5 days of discharge Complete    ?[place] or Home Care Consult Complete Complete Complete  ?SW Recovery Care/Counseling Consult Complete Complete Complete  ?Palliative Care Screening Complete Not Applicable Not Applicable  ?[place] Not Applicable Not Applicable Not Applicable  ? ? ? ? ? ?

## 2022-02-23 NOTE — Progress Notes (Signed)
PT Cancellation Note ? ?Patient Details ?Name: Daniel Barker ?MRN: [id number] ?DOB: May 07, 1946 ? ? ?Cancelled Treatment:    Reason Eval/Treat Not Completed: Patient declined, no reason specified.  Patient presents seated in chair and states he has been walking to bathroom with nursing staff supervising and declines therapy due to want to save energy to walk when returning home today. ? ? ?11:50 AM, 02/23/22 ?Lonell Grandchild, MPT ?Physical Therapist with [place] ?[facility] ?[phone] office ?[phone] mobile phone ? ?

## 2022-03-24 ENCOUNTER — Ambulatory Visit: Payer: Medicare Other | Admitting: Pulmonary Disease

## 2022-03-28 DEATH — deceased

## 2023-11-10 ENCOUNTER — Ambulatory Visit: Payer: Self-pay | Admitting: *Deleted

## 2023-11-10 NOTE — Patient Outreach (Signed)
  Care Coordination   Open in error/deceased  Visit Note   Nov 27, 2023 Name: Daniel Barker MRN: [id number] DOB: 01/14/1946  Daniel Barker is a 77 y.o. year old male who sees Margo Aye, Kathleene Hazel, MD for primary care. I  noted patient deceased in 04/06/2022 What matters to the patients health and wellness today?  Closure/deceased    Goals Addressed   None     SDOH assessments and interventions completed:  No     Care Coordination Interventions:  No, not indicated   Follow up plan: No further intervention required.   Encounter Outcome:  Patient Visit Completed   Cala Bradford L. Noelle Penner, RN, BSN, [facility]  [facility] Care Management Coordinator  [phone]  Fax: [phone]
